# Patient Record
Sex: Female | Born: 1935 | Race: Black or African American | Hispanic: No | Smoking: Never smoker
Health system: Southern US, Community
[De-identification: ages and names within clinical notes are randomized; demographics above are authoritative.]

## PROBLEM LIST (undated history)

## (undated) DIAGNOSIS — Z91041 Radiographic dye allergy status: Secondary | ICD-10-CM

## (undated) DIAGNOSIS — K589 Irritable bowel syndrome without diarrhea: Secondary | ICD-10-CM

## (undated) DIAGNOSIS — R350 Frequency of micturition: Secondary | ICD-10-CM

## (undated) DIAGNOSIS — R300 Dysuria: Secondary | ICD-10-CM

## (undated) DIAGNOSIS — N3281 Overactive bladder: Secondary | ICD-10-CM

## (undated) DIAGNOSIS — M1990 Unspecified osteoarthritis, unspecified site: Secondary | ICD-10-CM

## (undated) DIAGNOSIS — N390 Urinary tract infection, site not specified: Secondary | ICD-10-CM

## (undated) DIAGNOSIS — I10 Essential (primary) hypertension: Secondary | ICD-10-CM

## (undated) HISTORY — PX: COLONOSCOPY: SHX174

## (undated) HISTORY — PX: APPENDECTOMY: SHX54

## (undated) HISTORY — DX: Frequency of micturition: R35.0

## (undated) HISTORY — DX: Unspecified osteoarthritis, unspecified site: M19.90

## (undated) HISTORY — DX: Irritable bowel syndrome, unspecified: K58.9

## (undated) HISTORY — PX: VAGINAL HYSTERECTOMY: SUR661

## (undated) HISTORY — DX: Essential (primary) hypertension: I10

## (undated) HISTORY — DX: Overactive bladder: N32.81

## (undated) HISTORY — DX: Radiographic dye allergy status: Z91.041

## (undated) HISTORY — PX: OVARIAN CYST REMOVAL: SHX89

## (undated) HISTORY — PX: CHOLECYSTECTOMY: SHX55

## (undated) HISTORY — DX: Dysuria: R30.0

---

## 2000-06-28 ENCOUNTER — Emergency Department (HOSPITAL_COMMUNITY): Admission: EM | Admit: 2000-06-28 | Discharge: 2000-06-28 | Payer: Self-pay | Admitting: *Deleted

## 2001-04-09 ENCOUNTER — Ambulatory Visit (HOSPITAL_COMMUNITY): Admission: RE | Admit: 2001-04-09 | Discharge: 2001-04-09 | Payer: Self-pay | Admitting: Pulmonary Disease

## 2001-07-10 ENCOUNTER — Emergency Department (HOSPITAL_COMMUNITY): Admission: EM | Admit: 2001-07-10 | Discharge: 2001-07-10 | Payer: Self-pay | Admitting: Emergency Medicine

## 2002-01-15 ENCOUNTER — Emergency Department (HOSPITAL_COMMUNITY): Admission: EM | Admit: 2002-01-15 | Discharge: 2002-01-15 | Payer: Self-pay | Admitting: Emergency Medicine

## 2002-12-28 ENCOUNTER — Ambulatory Visit (HOSPITAL_COMMUNITY): Admission: RE | Admit: 2002-12-28 | Discharge: 2002-12-28 | Payer: Self-pay | Admitting: Pulmonary Disease

## 2005-03-10 ENCOUNTER — Emergency Department (HOSPITAL_COMMUNITY): Admission: EM | Admit: 2005-03-10 | Discharge: 2005-03-10 | Payer: Self-pay | Admitting: Emergency Medicine

## 2005-08-27 ENCOUNTER — Ambulatory Visit (HOSPITAL_COMMUNITY): Admission: RE | Admit: 2005-08-27 | Discharge: 2005-08-27 | Payer: Self-pay | Admitting: Gastroenterology

## 2005-08-27 ENCOUNTER — Ambulatory Visit: Payer: Self-pay | Admitting: Gastroenterology

## 2005-09-11 ENCOUNTER — Ambulatory Visit: Payer: Self-pay | Admitting: Gastroenterology

## 2005-09-11 ENCOUNTER — Ambulatory Visit (HOSPITAL_COMMUNITY): Admission: RE | Admit: 2005-09-11 | Discharge: 2005-09-11 | Payer: Self-pay | Admitting: Gastroenterology

## 2005-09-11 ENCOUNTER — Encounter (INDEPENDENT_AMBULATORY_CARE_PROVIDER_SITE_OTHER): Payer: Self-pay | Admitting: Specialist

## 2005-09-19 ENCOUNTER — Ambulatory Visit: Payer: Self-pay | Admitting: Gastroenterology

## 2005-12-24 ENCOUNTER — Emergency Department (HOSPITAL_COMMUNITY): Admission: EM | Admit: 2005-12-24 | Discharge: 2005-12-24 | Payer: Self-pay | Admitting: Emergency Medicine

## 2006-02-03 ENCOUNTER — Ambulatory Visit: Payer: Self-pay | Admitting: Gastroenterology

## 2006-02-20 ENCOUNTER — Ambulatory Visit: Payer: Self-pay | Admitting: Gastroenterology

## 2006-03-21 ENCOUNTER — Ambulatory Visit: Payer: Self-pay | Admitting: Gastroenterology

## 2006-07-30 ENCOUNTER — Ambulatory Visit: Payer: Self-pay | Admitting: Gastroenterology

## 2006-08-04 ENCOUNTER — Ambulatory Visit: Payer: Self-pay | Admitting: Gastroenterology

## 2006-08-04 ENCOUNTER — Encounter: Payer: Self-pay | Admitting: Gastroenterology

## 2006-08-04 ENCOUNTER — Ambulatory Visit (HOSPITAL_COMMUNITY): Admission: RE | Admit: 2006-08-04 | Discharge: 2006-08-04 | Payer: Self-pay | Admitting: Gastroenterology

## 2006-08-23 ENCOUNTER — Emergency Department (HOSPITAL_COMMUNITY): Admission: EM | Admit: 2006-08-23 | Discharge: 2006-08-23 | Payer: Self-pay | Admitting: Emergency Medicine

## 2007-01-07 ENCOUNTER — Ambulatory Visit: Payer: Self-pay | Admitting: Gastroenterology

## 2007-04-15 ENCOUNTER — Ambulatory Visit: Payer: Self-pay | Admitting: Gastroenterology

## 2007-06-23 ENCOUNTER — Ambulatory Visit: Payer: Self-pay | Admitting: Gastroenterology

## 2007-07-02 ENCOUNTER — Ambulatory Visit (HOSPITAL_COMMUNITY): Admission: RE | Admit: 2007-07-02 | Discharge: 2007-07-02 | Payer: Self-pay | Admitting: Internal Medicine

## 2007-07-13 ENCOUNTER — Ambulatory Visit (HOSPITAL_COMMUNITY): Admission: RE | Admit: 2007-07-13 | Discharge: 2007-07-13 | Payer: Self-pay | Admitting: Gastroenterology

## 2007-07-13 ENCOUNTER — Encounter: Payer: Self-pay | Admitting: Gastroenterology

## 2007-07-13 ENCOUNTER — Ambulatory Visit: Payer: Self-pay | Admitting: Gastroenterology

## 2007-08-20 DIAGNOSIS — R12 Heartburn: Secondary | ICD-10-CM | POA: Insufficient documentation

## 2007-08-20 DIAGNOSIS — E785 Hyperlipidemia, unspecified: Secondary | ICD-10-CM | POA: Insufficient documentation

## 2007-08-20 DIAGNOSIS — R142 Eructation: Secondary | ICD-10-CM

## 2007-08-20 DIAGNOSIS — R143 Flatulence: Secondary | ICD-10-CM

## 2007-08-20 DIAGNOSIS — K649 Unspecified hemorrhoids: Secondary | ICD-10-CM | POA: Insufficient documentation

## 2007-08-20 DIAGNOSIS — K5900 Constipation, unspecified: Secondary | ICD-10-CM | POA: Insufficient documentation

## 2007-08-20 DIAGNOSIS — R634 Abnormal weight loss: Secondary | ICD-10-CM | POA: Insufficient documentation

## 2007-08-20 DIAGNOSIS — I10 Essential (primary) hypertension: Secondary | ICD-10-CM | POA: Insufficient documentation

## 2007-08-20 DIAGNOSIS — H409 Unspecified glaucoma: Secondary | ICD-10-CM | POA: Insufficient documentation

## 2007-08-20 DIAGNOSIS — K2940 Chronic atrophic gastritis without bleeding: Secondary | ICD-10-CM | POA: Insufficient documentation

## 2007-08-20 DIAGNOSIS — K219 Gastro-esophageal reflux disease without esophagitis: Secondary | ICD-10-CM | POA: Insufficient documentation

## 2007-08-20 DIAGNOSIS — R197 Diarrhea, unspecified: Secondary | ICD-10-CM | POA: Insufficient documentation

## 2007-08-20 DIAGNOSIS — R109 Unspecified abdominal pain: Secondary | ICD-10-CM | POA: Insufficient documentation

## 2007-08-20 DIAGNOSIS — R141 Gas pain: Secondary | ICD-10-CM | POA: Insufficient documentation

## 2007-08-20 DIAGNOSIS — K589 Irritable bowel syndrome without diarrhea: Secondary | ICD-10-CM | POA: Insufficient documentation

## 2007-08-20 DIAGNOSIS — R1013 Epigastric pain: Secondary | ICD-10-CM

## 2007-08-20 DIAGNOSIS — Z8669 Personal history of other diseases of the nervous system and sense organs: Secondary | ICD-10-CM | POA: Insufficient documentation

## 2007-08-20 DIAGNOSIS — K3189 Other diseases of stomach and duodenum: Secondary | ICD-10-CM | POA: Insufficient documentation

## 2007-08-20 DIAGNOSIS — K625 Hemorrhage of anus and rectum: Secondary | ICD-10-CM | POA: Insufficient documentation

## 2007-10-17 ENCOUNTER — Emergency Department (HOSPITAL_COMMUNITY): Admission: EM | Admit: 2007-10-17 | Discharge: 2007-10-17 | Payer: Self-pay | Admitting: Emergency Medicine

## 2007-11-06 ENCOUNTER — Emergency Department (HOSPITAL_COMMUNITY): Admission: EM | Admit: 2007-11-06 | Discharge: 2007-11-07 | Payer: Self-pay | Admitting: Emergency Medicine

## 2007-11-26 ENCOUNTER — Emergency Department (HOSPITAL_COMMUNITY): Admission: EM | Admit: 2007-11-26 | Discharge: 2007-11-26 | Payer: Self-pay | Admitting: Emergency Medicine

## 2007-12-23 ENCOUNTER — Ambulatory Visit: Payer: Self-pay | Admitting: Gastroenterology

## 2007-12-25 ENCOUNTER — Ambulatory Visit (HOSPITAL_COMMUNITY): Admission: RE | Admit: 2007-12-25 | Discharge: 2007-12-25 | Payer: Self-pay | Admitting: Urology

## 2008-01-11 ENCOUNTER — Ambulatory Visit (HOSPITAL_COMMUNITY): Admission: RE | Admit: 2008-01-11 | Discharge: 2008-01-11 | Payer: Self-pay | Admitting: General Surgery

## 2008-07-27 ENCOUNTER — Emergency Department (HOSPITAL_COMMUNITY): Admission: EM | Admit: 2008-07-27 | Discharge: 2008-07-28 | Payer: Self-pay | Admitting: Emergency Medicine

## 2008-10-21 ENCOUNTER — Ambulatory Visit (HOSPITAL_COMMUNITY): Admission: RE | Admit: 2008-10-21 | Discharge: 2008-10-21 | Payer: Self-pay | Admitting: Nephrology

## 2008-11-25 ENCOUNTER — Emergency Department (HOSPITAL_COMMUNITY): Admission: EM | Admit: 2008-11-25 | Discharge: 2008-11-25 | Payer: Self-pay | Admitting: Emergency Medicine

## 2008-12-13 ENCOUNTER — Ambulatory Visit: Payer: Self-pay | Admitting: Gastroenterology

## 2008-12-14 ENCOUNTER — Encounter: Payer: Self-pay | Admitting: Gastroenterology

## 2009-01-18 ENCOUNTER — Ambulatory Visit (HOSPITAL_COMMUNITY): Admission: RE | Admit: 2009-01-18 | Discharge: 2009-01-18 | Payer: Self-pay | Admitting: Gastroenterology

## 2009-01-18 ENCOUNTER — Ambulatory Visit: Payer: Self-pay | Admitting: Gastroenterology

## 2009-11-04 ENCOUNTER — Emergency Department (HOSPITAL_COMMUNITY)
Admission: EM | Admit: 2009-11-04 | Discharge: 2009-11-04 | Payer: Self-pay | Source: Home / Self Care | Admitting: Emergency Medicine

## 2009-12-05 ENCOUNTER — Emergency Department (HOSPITAL_COMMUNITY)
Admission: EM | Admit: 2009-12-05 | Discharge: 2009-12-05 | Payer: Self-pay | Source: Home / Self Care | Admitting: Emergency Medicine

## 2009-12-26 ENCOUNTER — Ambulatory Visit: Payer: Self-pay | Admitting: Gastroenterology

## 2010-02-05 ENCOUNTER — Encounter: Payer: Self-pay | Admitting: Nephrology

## 2010-02-15 NOTE — Assessment & Plan Note (Signed)
Summary: DYSPEPSIA, POLYPS   Visit Type:  Follow-up Visit [facility]:  [name], M.D.   Chief Complaint:  1 year follow up- doing ok.  History of Present Illness: BP up and down. Pain from her arthritis. Rx: Hyrdocodone. Has a sensitive stomach and took it for one week, but started to burp and had gas so she stopped for a while. Also taking acetaminophen. CAN'T TAKE ANYTHING IN THE [facility]. BMS: PRACTICALLY EVERY DAY. Not too much milk ice cream or cheese. Rare carbonated beverages-Ginger Ale. Had great northern beans and before not a lot of gas, but after hydrocodone now lots of gas.  Current Medications (verified): 1)  Amlodipine Besy-Benazepril Hcl 5-20 Mg  Caps (Amlodipine Besy-Benazepril Hcl) .... Take 1 Capsule By Mouth Once A Day 2)  Xalatan 0.005 %  Soln (Latanoprost) .... At Bedtime 3)  Caltrate+d .... Once Daily 4)  Fish Oil .... Take 1 Tablet By Mouth Two Times A Day 5)  Tylenol .... As Needed 6)  Vesicare 5 Mg Tabs (Solifenacin Succinate) .... Once Daily 7)  Tekturna 150 Mg Tabs (Aliskiren Fumarate) .... 2 Once Daily 8)  Diovan 80 Mg Tabs (Valsartan) .... 2 Once Daily 9)  Carvedilol 12.5 Mg Tabs (Carvedilol) .... Once Daily 10)  Omeprazole 20 Mg Cpdr (Omeprazole) .... Once Daily  Allergies (verified): 1)  ! Sulfa 2)  ! Levsin 3)  ! Bentyl 4)  ! * Iv Contrast  Past History:  Past Medical History: ASCENDING COLON POLYP      Tubulovillous adenoma with multifocal high grade dysplasia-EMR 2007 [facility]      TV ADENOMA w/o dysplasia 2008      SIMPLE ADENOMA 2009      4mm SIMPLE ADENOMA JAN 2011 Non-ulcer dyspepsia HEMORRHOIDS-pain, no bleeding IBS HTN ALLERGY TO IV CONTRAST Glaucoma  Past Surgical History: Reviewed history from 08/20/2007 and no changes required. APPENDECTOMY HYSTERECTOMY REMOVAL OF OVARIAN CYST  Vital Signs:  Patient profile:   77 year old female Height:      64 inches Weight:      159 pounds BMI:     27.39 Temp:     98.0  degrees F oral Pulse rate:   60 / minute BP sitting:   130 / 92  (right arm) Cuff size:   regular  Vitals Entered By: [name] LPN (December 26, 2009 10:44 AM)  Physical Exam  General:  Well developed, well nourished, no acute distress. Head:  Normocephalic and atraumatic. Mouth:  No deformity or lesions. Neck:  Supple; no masses. Lungs:  Clear throughout to auscultation. Heart:  Regular rate and rhythm; no murmurs Abdomen:  Soft, nontender and nondistended. Normal bowel sounds. Extremities:  No edema or deformities noted. Neurologic:  Alert and  oriented x4;  grossly normal neurologically.  Impression & Recommendations:  Problem # 1:  DYSPEPSIA (ICD-536.8) Assessment Deteriorated After taking Vicodin. Add probiotic daily. Samples of Restrora given and Rx. AVOID FOODS THAT MAY CAUSE GAS AND BLOATING. HO given. OPV APR 2012.   Orders: Est. Patient Level III (04540)  Problem # 2:  TUBULOVILLOUS ADENOMA, COLON (ICD-211.3) Assessment: Unchanged TCS JAN 2014.  CC: PCP  Patient Instructions: 1)  TAKE RESTORA ONCE A DAY. 2)  AVOID FOODS THAT MAY CAUSE GAS AND BLOATING. 3)  FOLLOW UP IN APR 2012. 4)  The medication list was reviewed and reconciled.  All changed / newly prescribed medications were explained.  A complete medication list was provided to the patient / caregiver.  Appended Document: DYSPEPSIA,  POLYPS F/U OPV IN APRIL 2012 IS IN THE COMPUTER

## 2010-03-02 ENCOUNTER — Encounter (INDEPENDENT_AMBULATORY_CARE_PROVIDER_SITE_OTHER): Payer: Self-pay | Admitting: *Deleted

## 2010-03-07 NOTE — Letter (Signed)
Summary: Recall Office Visit  [facility]  [address]   [place], [place] [address]   Phone: [phone]  Fax: [phone]      March 02, 2010   Deborah Farrell [address] [place], [place]  [address] 1917/04/16   Dear Deborah Farrell,   According to our records, it is time for you to schedule a follow-up office visit with [place].   At your convenience, please call [phone] to schedule an office visit. If you have any questions, concerns, or feel that this letter is in error, we would appreciate your call.   Sincerely,    Diana Eves  [facility] Ph: [phone]   Fax: [phone]

## 2010-03-27 LAB — BASIC METABOLIC PANEL
Calcium: 9.7 mg/dL (ref 8.4–10.5)
Creatinine, Ser: 0.99 mg/dL (ref 0.4–1.2)
GFR calc Af Amer: >60 mL/min (ref >60)
GFR calc non Af Amer: 53 mL/min — ABNORMAL LOW (ref >60)
Sodium: 140 mEq/L (ref 135–145)

## 2010-04-09 ENCOUNTER — Encounter: Payer: Self-pay | Admitting: Gastroenterology

## 2010-04-10 DIAGNOSIS — K30 Functional dyspepsia: Secondary | ICD-10-CM | POA: Insufficient documentation

## 2010-04-18 LAB — CK TOTAL AND CKMB (NOT AT ARMC)
Relative Index: INVALID (ref 0.0–2.5)
Total CK: 91 U/L (ref 7–177)

## 2010-04-22 LAB — URINALYSIS, ROUTINE W REFLEX MICROSCOPIC
Glucose, UA: NEGATIVE mg/dL
Ketones, ur: NEGATIVE mg/dL
Protein, ur: NEGATIVE mg/dL
pH: 5.5 (ref 5.0–8.0)

## 2010-04-22 LAB — URINE MICROSCOPIC-ADD ON

## 2010-04-26 ENCOUNTER — Encounter: Payer: Self-pay | Admitting: Gastroenterology

## 2010-04-26 ENCOUNTER — Ambulatory Visit (INDEPENDENT_AMBULATORY_CARE_PROVIDER_SITE_OTHER): Payer: Medicare Other | Admitting: Gastroenterology

## 2010-04-26 DIAGNOSIS — K589 Irritable bowel syndrome without diarrhea: Secondary | ICD-10-CM

## 2010-04-26 DIAGNOSIS — D126 Benign neoplasm of colon, unspecified: Secondary | ICD-10-CM

## 2010-04-26 NOTE — Progress Notes (Signed)
Pt is aware of OV on 07/26/10 at 2 pm with [facility]

## 2010-04-26 NOTE — Assessment & Plan Note (Addendum)
MOST LIKELY CAUSE FOR INTERMITTENT ABD PAIN RELIEVED BY BM. Less likely 2o to meds.  Don't take omeprazole for 2 weeks and monitor episodes. If episode improve, continue to hold the omeprazole and take the [facility] daily. If episodes remain the same, then restart omeprazole daily. Follow up in 3 mos.

## 2010-04-26 NOTE — Patient Instructions (Signed)
REASONS FOR PERIODIC COLICKY PAIN: MEDICATION (RESTORA, OR OMEPRAZOLE) OR IRRITABLE BOWEL SYNDROME  Don't take omeprazole for 2 weeks and monitor episodes. If episode improve, continue to hold the omeprazole and take the [facility] daily. If episodes remain the same, then restart omeprazole daily. Follow up in 3 mos.

## 2010-04-26 NOTE — Progress Notes (Signed)
  Subjective:    Patient ID: Deborah Farrell, female    DOB: 1917/10/23, 77 y.o.   MRN: [id number]  HPI Been doing pretty good. Ever so often gets colicky pain, then BM (solid to soft, not watery). Taking OMP and Retsora so she stopped the Restora. Still has episodes once a week. Hasn't figured out what triggers it. Not really consuming dairy. [facility].  Past Medical History  Diagnosis Date          . Tubulovillous adenoma 2007 EMR [facility]    with multifocal high grade dysplasia  . Non-ulcer dyspepsia   . Hemorrhoids   . IBS (irritable bowel syndrome)   . HTN (hypertension)   . Glaucoma   . Tubulovillous adenoma 2008  ASC COLON W/O DYSPLASIA   . Adenoma of large intestine 2009 [facility] COLON SIMPLE    2011 [facility] COLON SIMPLE  . Allergy history, radiographic dye      Review of Systems DEC 2011 159 LBS    Objective:   Physical Exam  Constitutional: She is oriented to person, place, and time. She appears well-developed and well-nourished. No distress.  HENT:  Head: Normocephalic and atraumatic.  Mouth/Throat: Oropharynx is clear and moist.  Eyes: Pupils are equal, round, and reactive to light.  Cardiovascular: Normal rate and regular rhythm.   Pulmonary/Chest: Effort normal and breath sounds normal.  Abdominal: Soft. Bowel sounds are normal. There is no tenderness.  Musculoskeletal: She exhibits edema.       TRACE  Neurological: She is alert and oriented to person, place, and time.          Assessment & Plan:

## 2010-04-26 NOTE — Assessment & Plan Note (Signed)
TCS 2014

## 2010-05-21 ENCOUNTER — Telehealth: Payer: Self-pay

## 2010-05-21 NOTE — Telephone Encounter (Signed)
Pt came by the office. Said she stopped the Omeprazole as instructed. . She has not had any more colic. She started the [facility] on 05/09/2010. She is having some reflux about 2-3x weekly. Please advise!

## 2010-05-23 NOTE — Telephone Encounter (Signed)
Pt informed

## 2010-05-23 NOTE — Telephone Encounter (Signed)
Please call pt. If she is having reflux Sx then she should restart the OMP and take 30 minutes prior to her first meal. Continue [employer] daily.

## 2010-05-29 NOTE — Assessment & Plan Note (Signed)
NAMEMarland Kitchen  Deborah, Farrell                CHART#:  [id number]   DATE:  01/07/2007                       DOB:  1917-12-07   REFERRING PHYSICIAN:  [name], M.D.   PROBLEM LIST:  1. Recurrent tubulovillous adenoma in the ascending colon, last      colonoscopy July 2008.  2. Bloating, likely secondary to lactose intolerance.  3. Burning and gnawing in the middle of her stomach, felt to be      secondary to TriCor.  4. History of irritable bowel syndrome.  5. Hypertension.  6. Allergy to sulfa, Levsin, Bentyl, and IV contrast.  7. Glaucoma.  8. Mild chronic gastritis on EGD in August 2007.   SUBJECTIVE:  Deborah Farrell is an 77 year old female who presents as a return  patient visit.  She has been having problems with hemorrhoidal pain for  the last month.  She did use Anucort suppositories for a week and had  improvement, but denies complete resolution.  She also was using sitz  baths.  She has been taking exercise, where she has been doing leg lifts  with weights.  She denies any rectal bleeding.  She just has swelling  and pain/pressure, not all the time.  She has a bowel movement daily and  does not strain.  She is trying to drink her 6 to 8 cups of water a day.  She is not using any stool softeners.  Her bloating is doing well with  probiotics.   MEDICATIONS:  1. Lisinopril.  2. Dilantin.  3. Centrum Silver.  4. Acidophilus.  5. Fish oil.   OBJECTIVE:  Weight 163 pounds (up 2 pounds since July 2008), height 5  feet 5 inches, temperature 97.9, blood pressure 120/72, pulse 72.  GENERAL:  She is in no apparent distress.  Alert and oriented x4.  LUNGS:  Clear to auscultation bilaterally.  CARDIOVASCULAR:  Regular rhythm.  No murmur.  Normal S1, S2.  ABDOMEN:  Bowel sounds present, soft.  Nontender, nondistended.  No  rebound or guarding.  NEURO:  She has no focal neurologic deficits.   ASSESSMENT:  Deborah Farrell is an 77 year old female who has hemorrhoidal  discomfort.  She would  likely benefit from a longer course of Anucort  HC.  Thank you for allowing me to see Deborah Farrell in consultation.  My  recommendations are to follow.   RECOMMENDATIONS:  1. She is instructed to avoid doing the bicycle as part of her      exercise regimen, and she may do the leg lifts without the weights.  2. Anusol HC 1 per rectum every 12 hours for 14 days with 1 refill.  3. Add Colace 2 to 3 times a day to soften stool for 2 weeks.  She is      given her discharge instructions in writing.  4. She should continue to follow a high fiber diet and drink 6 to 8      cups of water a day.  She should add Citrucel or Fibersure once      daily for 2 weeks.  5. She may use witch hazel pads, ibuprofen, or Aleve for rectal pain.      She may also use Tylenol.  6. She has a follow up appointment to see me in 1 month.  7. TCS in 07/2007.  Deborah Farrell, M.D.  Electronically Signed     SM/MEDQ  D:  01/07/2007  T:  01/07/2007  Job:  [id number]   cc:   [name], M.D.

## 2010-05-29 NOTE — H&P (Signed)
NAME:  Deborah Farrell, Deborah Farrell               ACCOUNT NO.:  [id number]   MEDICAL RECORD NO.:  [id number]          PATIENT TYPE:  AMB   LOCATION:  DAY                           FACILITY:  [facility]   PHYSICIAN:  Dalia Heading, M.D.  DATE OF BIRTH:  1917-04-24   DATE OF ADMISSION:  DATE OF DISCHARGE:  LH                              HISTORY & PHYSICAL   CHIEF COMPLAINT:  Hemorrhoidal disease.   HISTORY OF PRESENT ILLNESS:  The patient is an 77 year old black female  who is referred for evaluation and treatment of hemorrhoidal disease.  She has tried multiple on rectal creams for hemorrhoidal disease, but  she continues to have irritation and swelling.  She denies any  constipation.  She does have to strain sometimes to move her bowels due  to her history of diverticulosis.  She is on a fiber diet.   PAST MEDICAL HISTORY:  Includes hypertension.   PAST SURGICAL HISTORY:  Cholecystectomy.   CURRENT MEDICATIONS:  Zilactin, Caltrate, fish oil, Diovan, Tekturna,  __________.   ALLERGIES:  No known drug allergies.   REVIEW OF SYSTEMS:  The patient denies any recent chest pain, MI, CVA,  or bleeding disorders.   PHYSICAL EXAMINATION:  GENERAL:  The patient is a well-developed, well-  nourished black female who appears at her stated age.  LUNGS:  Clear to auscultation with equal breath sounds bilaterally.  HEART:  Regular rate and rhythm without S3, S4, or murmurs.  ABDOMEN:  Soft, nontender, nondistended.  No hepatosplenomegaly or  masses are noted.  RECTAL:  Internal hemorrhoidal disease noted along the right lateral  aspect of the anus.   No significant rectal stenosis is noted.  Sphincter tone is fair.   IMPRESSION:  Hemorrhoidal disease.   PLAN:  The patient was scheduled for hemorrhoidectomy on January 11, 2008.  The risks and benefits of the procedure including bleeding,  infection, and recurrence of hemorrhoidal disease were fully explained  to the patient, gave informed  consent.      Dalia Heading, M.D.     MAJ/MEDQ  D:  12/24/2007  T:  12/25/2007  Job:  [id number]   cc:   [name], M.D.  Fax: [phone]   Kassie Mends, M.D.  [address]  [place] , [place] [address]

## 2010-05-29 NOTE — Consult Note (Signed)
NAME:  Deborah Farrell, Deborah Farrell               ACCOUNT NO.:  [id number]   MEDICAL RECORD NO.:  [id number]          PATIENT TYPE:  AMB   LOCATION:  DAY                           FACILITY:  [facility]   PHYSICIAN:  Kassie Mends, M.D.      DATE OF BIRTH:  07-Sep-1917   DATE OF CONSULTATION:  DATE OF DISCHARGE:                                 CONSULTATION   REFERRING PHYSICIAN:  [name], M.D.   PROBLEM LIST:  1. Recurrent tubulovillous adenoma in the ascending colon requiring      endoscopic mucosal resection in October 2007 and snare cautery and      piecemeal removal in July 2008.  2. Rectal discomfort and rectal bleeding secondary to hemorrhoids.  3. Bloating secondary to lactose intolerance.  4. Dyspepsia secondary to Tricor.  5. Irritated bowel syndrome diagnosed 9 years ago.  6. Hypertension.  7. Allergic to SULFA, LEVSIN, BENTYL, and IV CONTRAST.  8. Glaucoma.  9. Mild chronic gastritis on esophagogastroduodenoscopy in August      2007.   SUBJECTIVE:  Deborah Farrell is an 77 year old female who presents as a return  patient visit.  She has been seen for the last 5 to 6 months with  intermittent rectal bleeding and rectal discomfort due to the  hemorrhoids.  She is now managing her symptoms with water and softening  her stool.  She has also been exercising.  She is using the rectal cream  less than once a week.  If she feels swollen, then she will use the  cream sooner and therefore avoid flares.  She also due for her followup  colonoscopy due to the history of recurrent tubular-villous adenoma.   MEDICATIONS:  1. Amlodipine.  2. TriCor.  3. Xalatan.  4. Caltrate.  5. Fish oil.  6. Probiotic 2 daily.  7. Tylenol as needed.  8. Stool softner as needed.  9. Analpram or Anucort as needed.   OBJECTIVE:  VITAL SIGNS:  Her weight 166 pounds (up 2 pounds since April  of 2009).  Height 5 feet 5 inches.  Temperature 98.4, blood pressure  140/60, and pulse 80.  GENERAL:  She is in no apparent  distress.  She is alert and oriented  x4.LUNGS:  Clear to auscultation bilaterally.CARDIOVASCULAR:  Regular  rate and rhythm.  ABDOMEN:  Bowel sounds are present.  Soft, nontender and nondistended.   ASSESSMENT:  Deborah Farrell is an 77 year old female who is learning to  manage her hemorrhoids conservatively.  She does not desire surgical  intervention at this time.  She has a history of tubulovillous adenoma  and needs a repeat colonoscopy with a pediatric colonoscope.  She has  had no recent rectal bleeding. Thank you for allowing me to see Ms.  Farrell in consultation. My recommendations follow.   RECOMMENDATIONS:  1. Will schedule a colonoscopy in late June 2009, with a MoviPrep.  2. She has a follow up appointment to see me in 4 months.  She should      continue to manage her hemorrhoids conservatively and if that fails      then she  realizes surgical intervention is her only option.      Kassie Mends, M.D.  Electronically Signed     SM/MEDQ  D:  06/23/2007  T:  06/24/2007  Job:  [id number]   cc:   [name], M.D.  Fax: [phone]

## 2010-05-29 NOTE — Assessment & Plan Note (Signed)
NAME:  Deborah Farrell, Deborah Farrell                CHART#:  [id number]   DATE:                                   DOB:  01/27/1917   REFERRED BY:  [name], M.D.   PROBLEM:  1. Recurrent tubulovillous adenoma in the ascending colon, status post      endoscopic mucosal resection in October 2007, with recurrence of      the adenoma in July 2008.  2. Rectal discomfort and rectal bleeding with hemorrhoids.  3. Bloating, secondary to lactose intolerance.  4. Dyspepsia, secondary to TriCor.  5. History of irritable bowel syndrome diagnosed nine years ago.  6. Hypertension.  7. Allergies to SULFA, LEVSIN, BENTYL AND IV CONTRAST.  8. Glaucoma.  9. Mild chronic gastritis on the esophagogastroduodenoscopy in August,      2007.   SUBJECTIVE:  Deborah Farrell is an 77 year old female who presents as a return  patient visit.  She was last seen in December 2008.  She was complaining  of hemorrhoidal pain.  The exacerbation occurred due to doing exercises  with leg lifts.  At the time, she denied any rectal bleeding.  She was  asked to avoid straining and was given a prescription for Anusol HC with  one refill.  She was also asked to add Colace to her regimen.  She was  given a referral to Dr. Dalia Heading, because her hemorrhoids were  burning and itching.  She saw Dr. Lovell Sheehan yesterday and he stated she  was not a good operative candidate.  She has been utilizing Vaseline for  dryness around her anus.  He gave her a prescription for Analpram.  She  complains of rectal discomfort.  She only has ripping, tearing pain in  her rectum when she is constipated.  Sometimes she has hard stools, then  soft stool, then moderate stool.  She adjusts her Colace according to  the consistency of her stool.   MEDICATIONS:  1. Xalatan.  2. Centrum.  3. Acidophilus.  4. Fish oil.  5. Stool softener.  6. Amlodipine/Benazepril.  7. Bystolic.  8. Caltrate.  9. Analpram cream.   OBJECTIVE:  VITAL SIGNS:  Weight 164 pounds  (unchanged since December  2008), height 5 feet 5 inches, temperature 94 degrees, blood pressure  150/78, pulse 72.  GENERAL:  She is in no apparent distress, alert and oriented x4.  LUNGS:  Clear to auscultation bilaterally.CARDIOVASCULAR:  A regular rhythm.  No  murmurs.  ABDOMEN:  Bowel sounds present.  Soft, nontender, nondistended.   ASSESSMENT:  Deborah Farrell is an 77 year old female with rectal pain and  discomfort and occasional rectal bleeding, secondary to hemorrhoids.  She has been seen and evaluated by surgery, who declined any operative  therapy at this point.   Thank you for allowing me to see Deborah Farrell in consultation.  My  recommendations follow.   RECOMMENDATIONS:  1. She may use the Analpram-HC 2.5% three to four times daily for two      weeks.  She is given a prescription with five refills.  I did warn      her that chronic use of hydrocortisone could theoretically cause      problems with her adrenal glands, and that she should not use the      Analpram for  more than two weeks.  Once the Analpram is done, then      she should use the Preparation-H medicated wipes for any rectal      discomfort that she may have.  2. She needs a screening colonoscopy within the next three to four      months to reassess her ascending colon.  3. Return visit in two months.       Kassie Mends, M.D.  Electronically Signed     SM/MEDQ  D:  04/15/2007  T:  04/15/2007  Job:  [id number]   cc:   [name], M.D.

## 2010-05-29 NOTE — Consult Note (Signed)
[name], [name]               ACCOUNT NO.:  [id number]   MEDICAL RECORD NO.:  [id number]          PATIENT TYPE:  AMB   LOCATION:  DAY                           FACILITY:  [facility]   PHYSICIAN:  Kassie Mends, M.D.      DATE OF BIRTH:  May 23, 1917   DATE OF CONSULTATION:  07/30/2006  DATE OF DISCHARGE:                                 CONSULTATION   DAY OF VISIT:  07/30/2006   REFERRING PHYSICIAN:  Shaune Pollack   PROBLEM LIST:  1. Bloating likely secondary to lactose intolerance.  2. History of tubulovillous adenoma with multi focal high-grade      dysplasia removed via endoscopic mucosal resection in October 2007.  3. Burning and nervous gnawing in the middle her stomach secondary to      Tricor.  4. History of IBS diagnosed 8 years ago.  5. Hypertension.  6. Allergies to SULFA,  LEVSIN, BENTYL AND IV CONTRAST.  7. Glaucoma.  8. EGD in August 2007 which showed mild chronic gastritis.   SUBJECTIVE:  Deborah Farrell is a 77 year old female who presents as a return  patient visit.  She is on acidophilus daily which keeps the gas away.  She still avoiding dairy products some.  She is using Lactaid milk.  The burning and nervous gnawing is not as bad.  She also takes fish oil  once daily because 2 tablets cause abdominal cramping.   OBJECTIVE:  Weight 161 pounds, height 5 feet 7,  temperature 98.4, blood  pressure 128/70, pulse 72.  GENERAL:  She is in no apparent distress,  alert and orient x4.  LUNGS:  Clear to auscultation bilaterally.  CARDIOVASCULAR:  Shows regular rhythm, no murmur, normal S1-S2.  ABDOMEN:  Bowel sounds are present, soft, nontender, nondistended.  No  rebound or guarding.   ASSESSMENT:  Deborah Farrell is a 77 year old female who had a large  tubulovillous adenoma with multifocal high-grade dysplasia removed in  October 2007 and needs follow-up surveillance.  Thank you for allowing  me to see Ms. Troeger in consultation.  My recommendations follow.   RECOMMENDATIONS:  1.  Will schedule colonoscopy for surveillance.  2. She should continue the acidophilus and the Lactaid  3. Follow-up appointment in 6 months.      Kassie Mends, M.D.  Electronically Signed     SM/MEDQ  D:  07/31/2006  T:  08/01/2006  Job:  [id number]   cc:   Ramon Dredge L. Juanetta Gosling, M.D.  Fax: [phone]

## 2010-05-29 NOTE — Op Note (Signed)
NAME:  Deborah Farrell, Deborah Farrell               ACCOUNT NO.:  [id number]   MEDICAL RECORD NO.:  [id number]          PATIENT TYPE:  AMB   LOCATION:  DAY                           FACILITY:  [facility]   PHYSICIAN:  Kassie Mends, M.D.      DATE OF BIRTH:  1917/06/12   DATE OF PROCEDURE:  08/04/2006  DATE OF DISCHARGE:                               OPERATIVE REPORT   PROCEDURE:  Colonoscopy with piecemeal removal of ascending colon polyp,  via cold snare cold forceps biopsy and with cauterization of the base.   INDICATION FOR EXAM:  [name] is a 77 year old female with a history  of tubulovillous adenoma, with multifocal high-grade dysplasia removed  via endoscopic mucosal resection in October 2007 [facility].  She presents for follow-up colonoscopy to  reevaluate the polypectomy site.   FINDINGS:  1. A 1 cm sessile ascending colon polyp, seen in the area of previous      endoscopic mucosal resection -- as indicated by scarring.  An      attempt was made initially to remove the polyp via snare.  It was      difficult to get the snare around the polyp.  Part of the polyp was      snared via cold snare.  The cold forceps biopsies were introduced      into the scope and the polyp was removed piecemeal.  The base of      the polyp was cauterized using the 7-French Gold probe (200/20).  2. Pan colonic diverticulosis, most pronounced in the left colon.  3. Otherwise no masses, inflammatory changes, AVMs or internal      hemorrhoids seen.   RECOMMENDATIONS:  1. Clear liquid diet only for 8 hours, then resume a high-fiber diet.  2. The base of the ascending colon polyp was tattooed with 3 mL of      [place] ink.  If the polyp returns on pathology as a tubulovillous      adenoma with high-grade dysplasia, then [name] should likely      have a right hemicolectomy.  The polypectomy site has been      cauterized as much as possible.  Subsequent cauterization could      lead  to bowel perforation.  3. Will call [name] with her biopsy report. Colonoscopy in one      year.  4. No aspirin, NSAIDs or anticoagulation for 5 days.  5. She is given a handout on a high-fiber diet, polyps and      diverticulosis.   MEDICATIONS:  1. Demerol 75 mg IV.  2. Versed 4 mg IV.   PROCEDURE TECHNIQUE:  Physical examination was performed and an informed  consent was obtained from the patient, after explaining the benefits,  risks and alternatives to the procedure.  The patient was connected to  the monitor and placed in the left lateral position.  Continuous oxygen  was provided by nasal cannula and IV medicine administered through an  indwelling cannula.  After administration of sedation and rectal exam,  the pediatric  colonoscope was advanced under direct visualization to the cecum.  The  scope was removed slowly by carefully examining the color, texture,  anatomy and integrity of the mucosa on the way out.  The patient was  recovered in endoscopy and discharged home in satisfactory condition.      Kassie Mends, M.D.  Electronically Signed     SM/MEDQ  D:  08/04/2006  T:  08/04/2006  Job:  [id number]

## 2010-05-29 NOTE — Assessment & Plan Note (Signed)
NAMEMarland Kitchen  Deborah Farrell, Deborah Farrell                CHART#:  [id number]   DATE:  12/23/2007                       DOB:  12-Dec-1917   REFERRING PHYSICIAN:  [name], MD.   PROBLEM LIST:  1. Tubulovillous adenoma in the ascending colon requiring endoscopic      mucosal resection in October 2007 and piecemeal removal in July      2008.  Last colonoscopy in June 2009 showed tubular adenoma without      evidence of high-grade dysplasia.  2. Moderate diverticulosis.  3. Hypertension.  4. Hyperlipidemia.  5. Hemorrhoids.   SUBJECTIVE:  The patient is an 77 year old female who presents as a  return patient visit.  She continues to complain of her hemorrhoids  following up and causing itching and discomfort.  She has used Analpram  3 times a day for 10 days with complete resolution of her symptoms since  June 2009.  She reports that when she does use the cream his symptoms  resolve by 4-5 days later they are back.  She has mild rectal  discomfort.  She has not been constipated since been on a high-fiber  diet.  Actually, she overdoes it on the fibers.  It causes her  hemorrhoids to flare if her stool is too soft.  She has 2-3 bowel  movements a day.  She sees bleeding when she wipes less than once a  week.  She did have to use Analpram this morning because she has  stinging in the rectum.   MEDICATIONS:  1. Xalatan.  2. Caltrate.  3. Fish oil.  4. Probiotic as needed.  5. Tylenol as needed.  6. Stool softeners as needed.  7. Analpram as needed.  8. Trilipix.  9. Diovan.  10.Tekturna.  11.Carveda.   OBJECTIVE:  VITAL SIGNS:  Weight 164 pounds (down 2 pounds since June  2009), height 5 feet 5 inches, temperature 93, blood pressure 126/82,  and pulse 68.  GENERAL:  She is in no apparent distress.  Alert and oriented x4.HEENT:  Atraumatic, normocephalic.  Pupils equal and reactive to light.  Mouth,  no oral lesions.  Posterior pharynx without erythema or exudate.NECK:  Full range of motion.  No  lymphadenopathy.LUNGS:  Clear to auscultation  bilaterally.CARDIOVASCULAR:  Regular rhythm.  No murmur.  Normal S1 and  S2.ABDOMEN:  Bowel sounds are present, soft, nontender, nondistended.  No rebound or guarding.RECTUM:  She has small internal hemorrhoids that  do not appear to prolapse when she is in the left lateral position and  participates in the Valsalva maneuver.   ASSESSMENT:  The patient is an 77 year old female who continues to have  hemorrhoidal discomfort.  She would require a periodic use of Analpram.  Thank you for allowing me to see the patient in consultation.  My  recommendations follow.   RECOMMENDATIONS:  1. She may continue using the Analpram for rectal discomfort and      bleeding.  2. Will refer her to Dr. Lovell Sheehan to reassess the benefits versus risk      of hemorrhoidectomy.  3. Follow up appointment in 2 months.       Kassie Mends, M.D.  Electronically Signed     SM/MEDQ  D:  12/23/2007  T:  12/23/2007  Job:  [id number]   cc:   [name], M.D.

## 2010-05-29 NOTE — Op Note (Signed)
NAME:  Deborah Farrell, Deborah Farrell               ACCOUNT NO.:  [id number]   MEDICAL RECORD NO.:  [id number]          PATIENT TYPE:  AMB   LOCATION:  DAY                           FACILITY:  [facility]   PHYSICIAN:  Kassie Mends, M.D.      DATE OF BIRTH:  Oct 08, 1917   DATE OF PROCEDURE:  07/13/2007  DATE OF DISCHARGE:                               OPERATIVE REPORT   REFERRING PHYSICIAN:  [name], MD.   PROCEDURE:  Colonoscopy with cold forceps polypectomy and ablation of  the polyp base using cautery.   INDICATIONS FOR EXAMINATION:  Deborah Farrell is an 77 year old female who had  a tubulovillous adenoma in the ascending colon requiring an endoscopic  mucosal resection in August 2007.  She had a repeat colonoscopy in July  2008 and had piecemeal removal of a polyp in the same area which was  found to be a tubulovillous adenoma without evidence of high-grade  dysplasia.  She presents today to re-evaluate for recurrence of her  tubulovillous adenoma in the ascending colon.   FINDINGS:  1. A 6-mm flat polyp in the descending colon adjacent to the area that      was tattooed previously in July 2008.  I attempted to remove the      polyp via snare cautery, but because the polyp is located on the      fold, the snare could not be placed around the polyp.  The polyp      was removed piecemeal with the cold forceps biopsy and the base of      the polyp was cauterized.  2. Frequent shallow sigmoid diverticula.  Otherwise, no masses,      inflammatory changes, or AVMs seen.  3. Unable to appreciate internal hemorrhoids.   DIAGNOSIS:  Recurrent tubulovillous adenoma in the ascending colon.   RECOMMENDATIONS:  1. Will call Deborah Farrell with results of her biopsies.  Recommend repeat      colonoscopy in 1 year.  2. No aspirin, NSAIDs, or anticoagulation for 7 days.  She should      follow high-fiber diet.  She is given a handout on high-fiber diet,      diverticulosis, and hemorrhoids.   MEDICATIONS:  1. Demerol  50 mg IV.  2. Versed 4 mg IV.   PROCEDURE TECHNIQUE:  Physical exam was performed.  Informed consent was  obtained from the patient explaining benefits, risks, and alternatives  to the procedure.  The patient was connected to monitor and placed in  the left lateral position.  Continuous oxygen was provided by nasal  cannula, IV medicine administered through an indwelling cannula.  After  administration of sedation and rectal exam, the patient's rectum was  then abated, the scope advanced under direct visualization to the cecum.  The scope was removed slowly by carefully examining the  color, texture, anatomy, and integrity of the mucosa on the way out.  The patient was recovered in endoscopy and discharged home in  satisfactory condition.   PATH:  Simple adenoma. TCS in 1 year.      Kassie Mends, M.D.  Electronically  Signed     SM/MEDQ  D:  07/13/2007  T:  07/13/2007  Job:  [id number]   cc:   [name], M.D.  Fax: [phone]

## 2010-05-29 NOTE — H&P (Signed)
NAME:  Deborah Farrell, Deborah Farrell               ACCOUNT NO.:  [id number]   MEDICAL RECORD NO.:  [id number]          PATIENT TYPE:  AMB   LOCATION:  DAY                           FACILITY:  [facility]   PHYSICIAN:  Dalia Heading, M.D.  DATE OF BIRTH:  13-Mar-1917   DATE OF ADMISSION:  DATE OF DISCHARGE:  LH                              HISTORY & PHYSICAL   CHIEF COMPLAINT:  Hemorrhoidal disease.   HISTORY OF PRESENT ILLNESS:  The patient is an 77 year old black female  who is referred for evaluation and treatment of hemorrhoidal disease.  She has tried multiple on rectal creams for hemorrhoidal disease, but  she continues to have irritation and swelling.  She denies any  constipation.  She does have to strain sometimes to move her bowels due  to her history of diverticulosis.  She is on a fiber diet.   PAST MEDICAL HISTORY:  Includes hypertension.   PAST SURGICAL HISTORY:  Cholecystectomy.   CURRENT MEDICATIONS:  Zilactin, Caltrate, fish oil, Diovan, Tekturna,  __________.   ALLERGIES:  No known drug allergies.   REVIEW OF SYSTEMS:  The patient denies any recent chest pain, MI, CVA,  or bleeding disorders.   PHYSICAL EXAMINATION:  GENERAL:  The patient is a well-developed, well-  nourished black female who appears at her stated age.  LUNGS:  Clear to auscultation with equal breath sounds bilaterally.  HEART:  Regular rate and rhythm without S3, S4, or murmurs.  ABDOMEN:  Soft, nontender, nondistended.  No hepatosplenomegaly or  masses are noted.  RECTAL:  Internal hemorrhoidal disease noted along the right lateral  aspect of the anus.   No significant rectal stenosis is noted.  Sphincter tone is fair.   IMPRESSION:  Hemorrhoidal disease.   PLAN:  The patient was scheduled for hemorrhoidectomy on January 11, 2008.  The risks and benefits of the procedure including bleeding,  infection, and recurrence of hemorrhoidal disease were fully explained  to the patient, gave informed  consent.      Dalia Heading, M.D.  Electronically Signed     MAJ/MEDQ  D:  12/24/2007  T:  12/25/2007  Job:  [id number]   cc:   [name], M.D.  Fax: [phone]   Kassie Mends, M.D.  [address]  [place] , [place] [address]

## 2010-05-31 ENCOUNTER — Telehealth: Payer: Self-pay

## 2010-05-31 NOTE — Telephone Encounter (Signed)
Rx called to [facility]. LMOM for pt that it has been called in.

## 2010-05-31 NOTE — Telephone Encounter (Signed)
Pt came by office. Said she restarted the Omeprazole and took 30 min prior to breakfast as instructed. She is getting the colic again. But if she holds it she gets indigestion. Would like to try a different PPI if possible. Please advise! [facility])

## 2010-05-31 NOTE — Telephone Encounter (Signed)
Please call in Rx for Protonix 40 mg 30 minutes prior to first meal, #30, [id number]. Call pt and let her know it's been called in.

## 2010-06-01 NOTE — Op Note (Signed)
Deborah Farrell, Deborah Farrell               ACCOUNT NO.:  [id number]   MEDICAL RECORD NO.:  [id number]          PATIENT TYPE:  AMB   LOCATION:  DAY                           FACILITY:  [facility]   PHYSICIAN:  Kassie Mends, M.D.      DATE OF BIRTH:  09-13-1917   DATE OF PROCEDURE:  09/11/2005  DATE OF DISCHARGE:                                 OPERATIVE REPORT   REFERRING PHYSICIAN:  Edward L. Juanetta Gosling, M.D.   INDICATION FOR EXAM:  Deborah Farrell is a 77 year old female with a new onset  dyspepsia, weight loss and need for colon cancer screening.   PROCEDURE:  1. Colonoscopy with cold forceps biopsies.  2. Esophagogastroduodenoscopy with cold forceps biopsies.   FINDINGS:  1. The patient had a moderate amount of looping during the procedure which      made reaching the cecum challenging.  2 to 3 cm flat ascending colon      polyp.  Biopsies obtained via cold forceps.  Otherwise no inflammatory      changes or vascular ectasias or diverticula evident.  Normal      retroflexed view of the rectum.  2. Mild antral erythema.  Biopsies obtained of via cold forceps.      Otherwise normal esophagus without evidence of Barrett's.  Normal      duodenum.   RECOMMENDATIONS:  1. Biopsies of colon polyps sent rush.  The patient has a follow-up      appointment to see me next Thursday or Friday.  We will discuss the      need for endoscopic mucosal resection or surgery at that time.  2. The patient asked not to use aspirin, NSAIDs or anticoagulation.   MEDICATIONS:  1. Colonoscopy:  Demerol 100 mg IV, Versed 4 mg IV.  2. Esophagogastroduodenoscopy:  Versed 1 mg IV.   PROCEDURE TECHNIQUE:  Physical exam was performed and informed consent was  obtained from the patient after explaining the benefits, risks and  alternatives to the procedure.  The patient was connected to monitor placed  in left lateral position.  Continuous oxygen was provided by nasal cannula  IV medicine administered through an indwelling  cannula.  After  administration of sedation and rectal exam, the patient's rectum was  intubated.  The scope was advanced under direct visualization to the cecun.  The scope was subsequently moved slowly by carefully examining color,  texture, anatomy and integrity of the mucosa on the way out.   After the colonoscopy, the patient's esophagus was intubated and scope was  advanced under direct visualization to the second portion of duodenum.  Scope was subsequently removed slowly by carefully examining the color,  texture, anatomy and integrity of mucosa on the way out.  The patient was  recovered in the endoscopy suite and discharged home in satisfactory  condition.      Kassie Mends, M.D.  Electronically Signed     SM/MEDQ  D:  09/11/2005  T:  09/12/2005  Job:  [id number]   cc:   Ramon Dredge L. Juanetta Gosling, M.D.  Fax: [phone]

## 2010-07-05 ENCOUNTER — Telehealth: Payer: Self-pay

## 2010-07-05 NOTE — Telephone Encounter (Signed)
Pt informed

## 2010-07-05 NOTE — Telephone Encounter (Signed)
Please call pt. Those pills should not cause pain. Try taking the pills with food.

## 2010-07-05 NOTE — Telephone Encounter (Signed)
Pt said she is having right side pain after she takes her Alprazolam and also after she takes Tylenol. She is not sure if the other meds are causing any of the pain or not. I updated med list.

## 2010-07-25 ENCOUNTER — Ambulatory Visit (INDEPENDENT_AMBULATORY_CARE_PROVIDER_SITE_OTHER): Payer: Medicare Other | Admitting: Gastroenterology

## 2010-07-25 DIAGNOSIS — R109 Unspecified abdominal pain: Secondary | ICD-10-CM

## 2010-07-25 DIAGNOSIS — D126 Benign neoplasm of colon, unspecified: Secondary | ICD-10-CM

## 2010-07-25 NOTE — Progress Notes (Signed)
Subjective:    Patient ID: Deborah Farrell, female    DOB: 1917/03/28, 77 y.o.   MRN: [id number]  PCP: [name]  HPI Stopped taking OMP because she had upset stomach. No diarrhea. Bms: every day. No nausea or vomiting, dysuria, hematuria, blood in stool, melena. Appetite: good. No problems swallowing. Pantoprazole helps heartburn. Still has gas-Sx 2-3/wk when she gets up in the AM. Just goes away-< 30 mins. Appetite is good and weight is stable. Can have pain in right side-1x/wk, Sx over a year, lasts half a day. Uses heating pad for relief. Can't tolerate dicyclomine or hyoscyamine.   Past Medical History  Diagnosis Date  . Colon polyp     ascending colon  . Tubulovillous adenoma 2007 [facility]    with multifocal high grade dysplasia  . Non-ulcer dyspepsia   . Hemorrhoids   . IBS (irritable bowel syndrome)   . HTN (hypertension)   . Glaucoma   . Tubulovillous adenoma 2008  ASC COLON W/O DYSPLASIA   . Adenoma of large intestine 2009 ASC COLON SIMPLE    2011 [facility] COLON SIMPLE  . Allergy history, radiographic dye     Past Surgical History  Procedure Date  . Appendectomy   . Vaginal hysterectomy   . Removal of ovarian cyst   . Colonoscopy 04 August 2006    diverticulitis diagnosed, 1mm sessile polyp removed inascending colon. area tattood for future observation and reccomended f/u colonoscopy in one year   Allergies  Allergen Reactions  . Dicyclomine Hcl     REACTION: Unknown reaction  . Hyoscyamine Sulfate     REACTION: Unknown reaction  . Omeprazole Other (See Comments)    GI upset  . Sulfonamide Derivatives     REACTION: Unknown reaction   Current Outpatient Prescriptions  Medication Sig Dispense Refill  . acetaminophen (TYLENOL) 500 MG tablet Take 500 mg by mouth every 6 (six) hours as needed. As needed       . ALPRAZolam (XANAX) 0.25 MG tablet Take 0.25 mg by mouth at bedtime as needed. One tablet every 6 hours as needed       . amLODipine-valsartan (EXFORGE) 10-160 MG per  tablet Take by mouth daily. Exforge 5/320 mg  One daily      . amLODipine-valsartan (EXFORGE) 5-320 MG per tablet Take 1 tablet by mouth daily.        . Calcium Carbonate-Vitamin D (CALTRATE 600+D) 600-400 MG-UNIT per chew tablet Chew 1 tablet by mouth daily.        . carvedilol (COREG) 3.125 MG tablet Take 3.125 mg by mouth 4 (four) times daily.        . fish oil-omega-3 fatty acids 1000 MG capsule Take 1 g by mouth daily.        Marland Kitchen latanoprost (XALATAN) 0.005 % ophthalmic solution Place 1 drop into both eyes at bedtime.        . pantoprazole (PROTONIX) 40 MG tablet Take 40 mg by mouth daily.        . Probiotic Product ([facility] PO) Take by mouth.        . solifenacin (VESICARE) 5 MG tablet Take 5 mg by mouth daily.        Marland Kitchen amLODipine-benazepril (LOTREL) 5-20 MG per capsule Take 1 capsule by mouth daily.        . carvedilol (COREG) 12.5 MG tablet Take 12.5 mg by mouth 2 (two) times daily with a meal. Take two tablets twice daly      .  valsartan (DIOVAN) 80 MG tablet Take 160 mg by mouth daily.           Review of Systems     Objective:   Physical Exam  Vitals reviewed. Constitutional: She is oriented to person, place, and time. She appears well-developed and well-nourished.  HENT:  Head: Normocephalic and atraumatic.  Cardiovascular: Normal rate, regular rhythm and normal heart sounds.   Pulmonary/Chest: Effort normal and breath sounds normal.  Abdominal: Soft. Bowel sounds are normal. She exhibits no distension. There is no tenderness.  Musculoskeletal: She exhibits no edema.  Neurological: She is alert and oriented to person, place, and time.          Assessment & Plan:

## 2010-07-25 NOTE — Progress Notes (Signed)
Cc to PCP 

## 2010-07-25 NOTE — Assessment & Plan Note (Addendum)
MOST LIKELY 2o TO INTESTINAL GAS.  AVOID FOODS THAT MAY INCREASE BOWEL GAS. See HO. May try one Imodium during episodes of right side pain. Continue pantoprazole and [facility]. FOLLOW UP IN 3 MOS.

## 2010-07-25 NOTE — Patient Instructions (Signed)
AVOID FOODS THAT MAY INCREASE BOWEL GAS. See HO. May try one Imodium during episodes of right side pain. Continue pantoprazole and [facility]. FOLLOW UP IN 3 MOS.

## 2010-07-26 ENCOUNTER — Ambulatory Visit: Payer: Medicare Other | Admitting: Gastroenterology

## 2010-07-31 NOTE — Progress Notes (Signed)
Pt scheduled to see LL 10/23/10

## 2010-08-04 ENCOUNTER — Encounter (HOSPITAL_COMMUNITY): Payer: Self-pay | Admitting: *Deleted

## 2010-08-04 ENCOUNTER — Emergency Department (HOSPITAL_COMMUNITY)
Admission: EM | Admit: 2010-08-04 | Discharge: 2010-08-04 | Disposition: A | Discharge status: Home / Self Care | Payer: Medicare Other | Attending: Emergency Medicine | Admitting: Emergency Medicine

## 2010-08-04 DIAGNOSIS — R109 Unspecified abdominal pain: Secondary | ICD-10-CM | POA: Insufficient documentation

## 2010-08-04 DIAGNOSIS — R300 Dysuria: Secondary | ICD-10-CM | POA: Insufficient documentation

## 2010-08-04 DIAGNOSIS — N390 Urinary tract infection, site not specified: Secondary | ICD-10-CM

## 2010-08-04 DIAGNOSIS — R350 Frequency of micturition: Secondary | ICD-10-CM | POA: Insufficient documentation

## 2010-08-04 HISTORY — DX: Urinary tract infection, site not specified: N39.0

## 2010-08-04 LAB — URINALYSIS, ROUTINE W REFLEX MICROSCOPIC
Glucose, UA: NEGATIVE mg/dL
Ketones, ur: NEGATIVE mg/dL
Nitrite: NEGATIVE
Protein, ur: NEGATIVE mg/dL

## 2010-08-04 LAB — URINE MICROSCOPIC-ADD ON

## 2010-08-04 MED ORDER — CEPHALEXIN 500 MG PO CAPS: 500.0000 mg | ORAL_CAPSULE | Freq: Three times a day (TID) | ORAL | Status: AC

## 2010-08-04 MED ORDER — CEPHALEXIN 500 MG PO CAPS
500.0000 mg | ORAL_CAPSULE | Freq: Once | ORAL | Status: AC
  Administered 2010-08-04: 500 mg via ORAL
  Filled 2010-08-04: qty 1

## 2010-08-04 NOTE — ED Notes (Signed)
C/o dysuria and frequency.

## 2010-08-04 NOTE — ED Provider Notes (Signed)
History     Chief Complaint  Patient presents with  . Urinary frequency    Patient is a 77 y.o. female presenting with frequency.  Urinary Frequency This is a new problem. The current episode started 6 to 12 hours ago. The problem occurs constantly. The problem has not changed since onset.Associated symptoms comments: No fever or chills. She does have dysuria and urinary urgency. Mild suprapubic pain with urination. No flank pain. No nausea or vomiting. Symptoms are severe. She rates her pain at 7/10 when she urinates, currently in no pain. She has had similar symptoms before.. The symptoms are aggravated by nothing. The symptoms are relieved by nothing. She has tried nothing for the symptoms.    Past Medical History  Diagnosis Date  . Colon polyp     ascending colon  . Tubulovillous adenoma 2007 [facility]    with multifocal high grade dysplasia  . Non-ulcer dyspepsia   . Hemorrhoids   . IBS (irritable bowel syndrome)   . HTN (hypertension)   . Glaucoma   . Tubulovillous adenoma 2008  ASC COLON W/O DYSPLASIA   . Adenoma of large intestine 2009 [facility] COLON SIMPLE    2011 [facility] COLON SIMPLE  . Allergy history, radiographic dye   . Urinary tract infection     Past Surgical History  Procedure Date  . Appendectomy   . Vaginal hysterectomy   . Removal of ovarian cyst   . Colonoscopy 04 August 2006    diverticulitis diagnosed, 1mm sessile polyp removed inascending colon. area tattood for future observation and reccomended f/u colonoscopy in one year    No family history on file.  History  Substance Use Topics  . Smoking status: Never Smoker   . Smokeless tobacco: Never Used  . Alcohol Use: No    OB History    Grav Para Term Preterm Abortions TAB SAB Ect Mult Living                  Review of Systems  Genitourinary: Positive for frequency.  All other systems reviewed and are negative.    Physical Exam  BP 135/56  Pulse 68  Temp(Src) 99.3 F (37.4 C) (Oral)  Resp 16   Ht 5\' 6"  (1.676 m)  Wt 157 lb (71.215 kg)  BMI 25.34 kg/m2  SpO2 97%  Physical Exam  Nursing note and vitals reviewed. Constitutional: She is oriented to person, place, and time. She appears well-developed and well-nourished. No distress.  HENT:  Head: Normocephalic and atraumatic.  Right Ear: External ear normal.  Left Ear: External ear normal.  Mouth/Throat: Oropharynx is clear and moist.  Eyes: EOM are normal. Pupils are equal, round, and reactive to light. No scleral icterus.  Neck: Normal range of motion. Neck supple. No JVD present.  Cardiovascular: Normal rate, regular rhythm and normal heart sounds.   No murmur heard. Pulmonary/Chest: Effort normal and breath sounds normal. She has no wheezes. She has no rales.  Abdominal: Soft. Bowel sounds are normal. She exhibits no mass. There is no tenderness.       No CVA tenderness.  Musculoskeletal: Normal range of motion. She exhibits no edema.  Lymphadenopathy:    She has no cervical adenopathy.  Neurological: She is alert and oriented to person, place, and time. No cranial nerve deficit. Coordination normal.  Skin: Skin is warm and dry. No rash noted.  Psychiatric: She has a normal mood and affect.    ED Course  Procedures  MDM Keflex started for  UTI.      Dione Booze, MD 08/04/10 1011

## 2010-08-04 NOTE — ED Notes (Signed)
MD at bedside. 

## 2010-08-04 NOTE — ED Notes (Signed)
Pt states, "I think i have cystitis" . Symptoms include urinary frequency, lower back pain and lower abdominal pressure with urination. Symptoms began last night.

## 2010-08-08 ENCOUNTER — Other Ambulatory Visit (HOSPITAL_COMMUNITY): Payer: Self-pay | Admitting: Urology

## 2010-08-08 DIAGNOSIS — R3915 Urgency of urination: Secondary | ICD-10-CM

## 2010-08-08 DIAGNOSIS — Z9289 Personal history of other medical treatment: Secondary | ICD-10-CM

## 2010-08-08 DIAGNOSIS — N390 Urinary tract infection, site not specified: Secondary | ICD-10-CM

## 2010-08-10 ENCOUNTER — Ambulatory Visit (HOSPITAL_COMMUNITY)
Admission: RE | Admit: 2010-08-10 | Discharge: 2010-08-10 | Disposition: A | Discharge status: Home / Self Care | Payer: Medicare Other | Source: Ambulatory Visit | Attending: Urology | Admitting: Urology

## 2010-08-10 DIAGNOSIS — N390 Urinary tract infection, site not specified: Secondary | ICD-10-CM | POA: Insufficient documentation

## 2010-08-10 DIAGNOSIS — Q619 Cystic kidney disease, unspecified: Secondary | ICD-10-CM | POA: Insufficient documentation

## 2010-08-10 DIAGNOSIS — Z9289 Personal history of other medical treatment: Secondary | ICD-10-CM

## 2010-08-10 DIAGNOSIS — R3915 Urgency of urination: Secondary | ICD-10-CM

## 2010-08-18 ENCOUNTER — Emergency Department (HOSPITAL_COMMUNITY)
Admission: EM | Admit: 2010-08-18 | Discharge: 2010-08-18 | Disposition: A | Discharge status: Home / Self Care | Payer: Medicare Other | Attending: Emergency Medicine | Admitting: Emergency Medicine

## 2010-08-18 ENCOUNTER — Encounter (HOSPITAL_COMMUNITY): Payer: Self-pay

## 2010-08-18 DIAGNOSIS — K589 Irritable bowel syndrome without diarrhea: Secondary | ICD-10-CM | POA: Insufficient documentation

## 2010-08-18 DIAGNOSIS — I10 Essential (primary) hypertension: Secondary | ICD-10-CM | POA: Insufficient documentation

## 2010-08-18 DIAGNOSIS — N390 Urinary tract infection, site not specified: Secondary | ICD-10-CM | POA: Insufficient documentation

## 2010-08-18 LAB — URINALYSIS, ROUTINE W REFLEX MICROSCOPIC
Glucose, UA: NEGATIVE mg/dL
Ketones, ur: NEGATIVE mg/dL
Protein, ur: 100 mg/dL — AB
pH: 6 (ref 5.0–8.0)

## 2010-08-18 LAB — URINE MICROSCOPIC-ADD ON

## 2010-08-18 MED ORDER — CIPROFLOXACIN HCL 500 MG PO TABS: 500.0000 mg | ORAL_TABLET | Freq: Two times a day (BID) | ORAL | Status: AC

## 2010-08-18 NOTE — ED Provider Notes (Signed)
History     CSN: [id number] Arrival date & time: 08/18/2010  6:49 AM  Chief Complaint  Patient presents with  . Abdominal Pain   HPI Comments: Recently treated for uti which improved, off antibiotics, now returning.    Patient is a 77 y.o. female presenting with abdominal pain. The history is provided by the patient.  Abdominal Pain The primary symptoms of the illness include abdominal pain and dysuria. The primary symptoms of the illness do not include fever, shortness of breath, nausea, vomiting or diarrhea. The current episode started more than 2 days ago. The onset of the illness was gradual. The problem has been gradually worsening.  The dysuria is associated with frequency. The dysuria is not associated with hematuria.  Additional symptoms associated with the illness include frequency. Symptoms associated with the illness do not include chills, constipation or hematuria.    Past Medical History  Diagnosis Date  . Colon polyp     ascending colon  . Tubulovillous adenoma 2007 EMR [facility]    with multifocal high grade dysplasia  . Non-ulcer dyspepsia   . Hemorrhoids   . IBS (irritable bowel syndrome)   . HTN (hypertension)   . Glaucoma   . Tubulovillous adenoma 2008  ASC COLON W/O DYSPLASIA   . Adenoma of large intestine 2009 ASC COLON SIMPLE    2011 ASC COLON SIMPLE  . Allergy history, radiographic dye   . Urinary tract infection     Past Surgical History  Procedure Date  . Appendectomy   . Vaginal hysterectomy   . Removal of ovarian cyst   . Colonoscopy 04 August 2006    diverticulitis diagnosed, 1mm sessile polyp removed inascending colon. area tattood for future observation and reccomended f/u colonoscopy in one year    No family history on file.  History  Substance Use Topics  . Smoking status: Never Smoker   . Smokeless tobacco: Never Used  . Alcohol Use: No    OB History    Grav Para Term Preterm Abortions TAB SAB Ect Mult Living                  Review  of Systems  Constitutional: Negative for fever and chills.  HENT: Negative.   Respiratory: Negative.  Negative for cough, chest tightness and shortness of breath.   Cardiovascular: Negative for chest pain and palpitations.  Gastrointestinal: Positive for abdominal pain. Negative for nausea, vomiting, diarrhea and constipation.  Genitourinary: Positive for dysuria and frequency. Negative for hematuria and flank pain.  All other systems reviewed and are negative.    Physical Exam  BP 137/67  Pulse 67  Temp(Src) 98.4 F (36.9 C) (Oral)  Resp 16  Ht 5\' 6"  (1.676 m)  Wt 154 lb (69.854 kg)  BMI 24.86 kg/m2  SpO2 98%  Physical Exam  Constitutional: She is oriented to person, place, and time. She appears well-developed and well-nourished. No distress.  HENT:  Head: Normocephalic and atraumatic.  Neck: Normal range of motion. Neck supple.  Cardiovascular: Normal rate and regular rhythm.  Exam reveals no gallop and no friction rub.   No murmur heard. Pulmonary/Chest: Effort normal and breath sounds normal. No respiratory distress. She has no wheezes. She has no rales.  Abdominal: Soft. Bowel sounds are normal. She exhibits no distension. There is no tenderness. There is no rebound and no guarding.  Musculoskeletal: Normal range of motion.  Neurological: She is alert and oriented to person, place, and time.  Skin: Skin is warm and  dry. She is not diaphoretic.    ED Course  Procedures  MDM The UA shows some bacteria and leukocytes.  Abdomen benign.  Will treat as uti, follow up prn.          Geoffery Lyons, MD 08/18/10 [phone]

## 2010-08-18 NOTE — ED Notes (Signed)
Recurrent uti sx

## 2010-08-18 NOTE — ED Notes (Signed)
Pt complain of burning with urination. States she was recently tx for a uti with keflex

## 2010-10-09 ENCOUNTER — Encounter: Payer: Self-pay | Admitting: Gastroenterology

## 2010-10-15 LAB — BASIC METABOLIC PANEL
CO2: 28
Calcium: 10.2
Creatinine, Ser: 0.89
Glucose, Bld: 102 — ABNORMAL HIGH

## 2010-10-15 LAB — URINE CULTURE
Colony Count: NO GROWTH
Culture: NO GROWTH

## 2010-10-15 LAB — URINALYSIS, ROUTINE W REFLEX MICROSCOPIC
Bilirubin Urine: NEGATIVE
Ketones, ur: NEGATIVE
Protein, ur: NEGATIVE
Urobilinogen, UA: 0.2

## 2010-10-15 LAB — DIFFERENTIAL
Basophils Relative: 0
Eosinophils Absolute: 0.1
Monocytes Absolute: 0.6
Neutro Abs: 4.8
Neutrophils Relative %: 68

## 2010-10-15 LAB — CBC
HCT: 34.9 — ABNORMAL LOW
WBC: 7.1

## 2010-10-16 LAB — URINALYSIS, ROUTINE W REFLEX MICROSCOPIC
Bilirubin Urine: NEGATIVE
Ketones, ur: NEGATIVE
Nitrite: NEGATIVE
Urobilinogen, UA: 0.2
pH: 6

## 2010-10-18 LAB — CBC
HCT: 30.8 % — ABNORMAL LOW (ref 36.0–46.0)
MCV: 95.3 fL (ref 78.0–100.0)
Platelets: 313 10*3/uL (ref 150–400)
RDW: 13.4 % (ref 11.5–15.5)

## 2010-10-18 LAB — BASIC METABOLIC PANEL
BUN: 42 mg/dL — ABNORMAL HIGH (ref 6–23)
GFR calc non Af Amer: 29 mL/min — ABNORMAL LOW (ref >60)
Glucose, Bld: 94 mg/dL (ref 70–99)
Potassium: 4.9 mEq/L (ref 3.5–5.1)

## 2010-10-25 ENCOUNTER — Encounter: Payer: Self-pay | Admitting: Cardiology

## 2010-10-29 ENCOUNTER — Ambulatory Visit (INDEPENDENT_AMBULATORY_CARE_PROVIDER_SITE_OTHER): Payer: Medicare Other | Admitting: Cardiology

## 2010-10-29 ENCOUNTER — Encounter: Payer: Self-pay | Admitting: Cardiology

## 2010-10-29 DIAGNOSIS — D649 Anemia, unspecified: Secondary | ICD-10-CM

## 2010-10-29 DIAGNOSIS — I10 Essential (primary) hypertension: Secondary | ICD-10-CM

## 2010-10-29 LAB — URINE CULTURE: Colony Count: 8000

## 2010-10-29 LAB — URINALYSIS, ROUTINE W REFLEX MICROSCOPIC
Bilirubin Urine: NEGATIVE
Nitrite: NEGATIVE
Protein, ur: NEGATIVE
Specific Gravity, Urine: 1.015
Urobilinogen, UA: 0.2

## 2010-10-29 MED ORDER — CHLORTHALIDONE 25 MG PO TABS: 25.0000 mg | ORAL_TABLET | Freq: Every day | ORAL | Status: DC

## 2010-10-29 NOTE — Patient Instructions (Signed)
Your physician wants you to follow-up in: 3 weeks   Your physician has recommended you make the following change in your medication:  1.) Start Chlorthalidone 25mg  daily  Your physician recommends that you return for lab work in: 2 Weeks (BMET)

## 2010-10-29 NOTE — Assessment & Plan Note (Signed)
She has been seen and examined by myself and Dr.Rothbart. Orthostatics have been completed and are found to be negative.  She has an aspect of anxiety as well as chronic hypertension in general. Reviewed renal ultrasound and found to be negative for RAS. Review of labs demonstrates Creat 1.12 with GFR of 34.    She will be placed on chlorothalidone, 25 mg daily. Follow-up BP records will be completed by the patient. She is asked to take the BP at the same time everyday, and take her medications at the same time everyday as well.  She will not have any testing completed at this time i.e. Echocardiogram.  We will follow-up with a BMET in 2 weeks and office visit in 3 weeks.

## 2010-10-29 NOTE — Progress Notes (Signed)
HPI:Deborah Farrell is a 77 y/o patient we are seeing at the kind request of Dr. Dwana Melena for assessment and treatment of difficult to control hypertension. She has not been seen by a cardiologist is the past, or had any cardiac testing. She is followed also by Dr. Fausto Skillern and has had a renal ultrasound completed on 08/10/2010 which demonstrated no evidence of renal artery disease, no evidence for hydronephrosis.    She is recording BP at home as high as 200 systolic. She says it is higher in the morning.  She also admits to a lot of anxiety with family issues that cause her BP to go up. If she takes a xanax the BP does come down.  She states that the afternoon BP is usually normal in the 130's or 140's. As a result of her labile BP, we are asked for further recommendations and management of hypertension.  Allergies  Allergen Reactions  . Dicyclomine Hcl     REACTION: Unknown reaction  . Hyoscyamine Sulfate     REACTION: Unknown reaction  . Omeprazole Other (See Comments)    GI upset  . Sulfonamide Derivatives     REACTION: Unknown reaction    Current Outpatient Prescriptions  Medication Sig Dispense Refill  . acetaminophen (TYLENOL) 500 MG tablet Take 500 mg by mouth every 6 (six) hours as needed. As needed       . ALPRAZolam (XANAX) 0.25 MG tablet Take 0.25 mg by mouth at bedtime as needed. One tablet every 6 hours as needed       . amLODipine-valsartan (EXFORGE) 5-320 MG per tablet Take 1 tablet by mouth daily.        . Calcium Carbonate-Vitamin D (CALTRATE 600+D) 600-400 MG-UNIT per chew tablet Chew 1 tablet by mouth daily.        . carvedilol (COREG) 12.5 MG tablet Take 6.25 mg by mouth 2 (two) times daily with a meal. Take two tablets twice daly      . fish oil-omega-3 fatty acids 1000 MG capsule Take 1 g by mouth daily.        Marland Kitchen latanoprost (XALATAN) 0.005 % ophthalmic solution Place 1 drop into both eyes at bedtime.        . Multiple Vitamins-Minerals (MULTI COMPLETE PO) Take by mouth.         . pantoprazole (PROTONIX) 40 MG tablet Take 40 mg by mouth daily.        . Probiotic Product (PHILLIPS COLON HEALTH PO) Take by mouth.        . solifenacin (VESICARE) 5 MG tablet Take 5 mg by mouth daily.        . chlorthalidone (HYGROTON) 25 MG tablet Take 1 tablet (25 mg total) by mouth daily.  30 tablet  12    Past Medical History  Diagnosis Date  . Colon polyp     ascending colon  . Tubulovillous adenoma 2007 EMR [facility]    with multifocal high grade dysplasia  . Non-ulcer dyspepsia   . Hemorrhoids   . IBS (irritable bowel syndrome)   . HTN (hypertension)   . Glaucoma   . Tubulovillous adenoma 2008  ASC COLON W/O DYSPLASIA   . Adenoma of large intestine 2009 ASC COLON SIMPLE    2011 ASC COLON SIMPLE  . Allergy history, radiographic dye   . Urinary tract infection     Past Surgical History  Procedure Date  . Appendectomy   . Vaginal hysterectomy   . Removal of ovarian cyst   .  Colonoscopy 04 August 2006    diverticulitis diagnosed, 1mm sessile polyp removed inascending colon. area tattood for future observation and reccomended f/u colonoscopy in one year    Family History  Problem Relation Age of Onset  . Stroke Mother   . Heart attack Sister   . Heart disease Sister     History   Social History  . Marital Status: Widowed    Spouse Name: N/A    Number of Children: N/A  . Years of Education: N/A   Occupational History  . Not on file.   Social History Main Topics  . Smoking status: Never Smoker   . Smokeless tobacco: Never Used  . Alcohol Use: No  . Drug Use: No  . Sexually Active: No   Other Topics Concern  . Not on file   Social History Narrative  . No narrative on file    ZOX:WRUEAV of systems complete and found to be negative unless listed above  PHYSICAL EXAM BP 185/83  Pulse 70  Resp 18  Ht 5\' 6"  (1.676 m)  Wt 157 lb (71.215 kg)  BMI 25.34 kg/m2  General: Well developed, well nourished, in no acute distress Head: Eyes PERRLA, No  xanthomas.   Normal cephalic and atramatic  Lungs: Clear bilaterally to auscultation and percussion. Heart: HRRR S1 S2, soft S4.  Pulses are 2+ & equal.            Right sided carotid bruit. No JVD.  No abdominal bruits. No femoral bruits. Abdomen: Bowel sounds are positive, abdomen soft and non-tender without masses or                  Hernia's noted. Msk:  Back normal, normal gait. Normal strength and tone for age. Extremities: No clubbing, cyanosis, 1+ edema.  DP +1 Neuro: Alert and oriented X 3. Psych:  Good affect, responds appropriately  EKG: NSR rate of 64 bpm.  Minimal voltage for LVH. Nonspecific T-wave abnormality in the lateral leads.  ASSESSMENT AND PLAN

## 2010-10-30 ENCOUNTER — Telehealth: Payer: Self-pay | Admitting: Cardiology

## 2010-10-30 ENCOUNTER — Other Ambulatory Visit: Payer: Self-pay | Admitting: *Deleted

## 2010-10-30 MED ORDER — CHLORTHALIDONE 25 MG PO TABS: 25.0000 mg | ORAL_TABLET | Freq: Every day | ORAL | Status: DC

## 2010-10-30 NOTE — Telephone Encounter (Signed)
Pt needs rx called in to [facility] in [place] for chlorthalidone 25mg  new rx for 10/29/10 visit. She states that pharmacy never got request.

## 2010-11-05 ENCOUNTER — Encounter: Payer: Self-pay | Admitting: *Deleted

## 2010-11-08 LAB — BASIC METABOLIC PANEL
Potassium: 4.8 mEq/L (ref 3.5–5.3)
Sodium: 131 mEq/L — ABNORMAL LOW (ref 135–145)

## 2010-11-13 ENCOUNTER — Ambulatory Visit (INDEPENDENT_AMBULATORY_CARE_PROVIDER_SITE_OTHER): Payer: Medicare Other | Admitting: Gastroenterology

## 2010-11-13 ENCOUNTER — Encounter: Payer: Self-pay | Admitting: Gastroenterology

## 2010-11-13 VITALS — BP 133/69 | HR 71 | Temp 97.4°F | Ht 65.0 in | Wt 155.2 lb

## 2010-11-13 DIAGNOSIS — K3189 Other diseases of stomach and duodenum: Secondary | ICD-10-CM

## 2010-11-13 DIAGNOSIS — R1013 Epigastric pain: Secondary | ICD-10-CM

## 2010-11-13 DIAGNOSIS — K30 Functional dyspepsia: Secondary | ICD-10-CM

## 2010-11-13 DIAGNOSIS — K5900 Constipation, unspecified: Secondary | ICD-10-CM

## 2010-11-13 DIAGNOSIS — K589 Irritable bowel syndrome without diarrhea: Secondary | ICD-10-CM

## 2010-11-13 NOTE — Progress Notes (Signed)
Primary Care Physician: Dwana Melena, MD  Primary Gastroenterologist:  Jonette Eva, MD   Chief Complaint  Patient presents with  . Constipation  . Abdominal Pain    HPI: Deborah Farrell is a 77 y.o. female here for three-month followup. Overall she has been doing well however she has noted constipation since starting chlorthalidone. She started taking Colace about a week ago. She's taking 2-3 daily. Having BM most days but stools can be hard. She admits she has not been consistent with the medication as of yet. Denies any blood in stool. No abnormal pain. Her appetite has been good. Seems to be doing well on pantoprazole. Does not complain of any bloating or gas. Overall feeling well. She states she is lucky that she did not bang this weekend when she had a significant fall. Denies any pain or soreness.  Current Outpatient Prescriptions  Medication Sig Dispense Refill  . acetaminophen (TYLENOL) 500 MG tablet Take 500 mg by mouth every 6 (six) hours as needed. As needed       . ALPRAZolam (XANAX) 0.25 MG tablet Take 0.25 mg by mouth at bedtime as needed. One tablet every 6 hours as needed       . amLODipine-valsartan (EXFORGE) 5-320 MG per tablet Take 1 tablet by mouth daily.        . Calcium Carbonate-Vitamin D (CALTRATE 600+D) 600-400 MG-UNIT per chew tablet Chew 1 tablet by mouth daily.        . carvedilol (COREG) 6.25 MG tablet Take 6.25 mg by mouth 2 (two) times daily with a meal.       . chlorthalidone (HYGROTON) 25 MG tablet Take 1 tablet (25 mg total) by mouth daily.  90 tablet  2  . docusate sodium (COLACE) 100 MG capsule Take 100 mg by mouth 2 (two) times daily.        . fish oil-omega-3 fatty acids 1000 MG capsule Take 1 g by mouth daily.        Marland Kitchen latanoprost (XALATAN) 0.005 % ophthalmic solution Place 1 drop into both eyes at bedtime.        . Multiple Vitamins-Minerals (MULTI COMPLETE PO) Take by mouth.        . pantoprazole (PROTONIX) 40 MG tablet Take 40 mg by mouth daily.          . Probiotic Product (PHILLIPS COLON HEALTH PO) Take by mouth.        . solifenacin (VESICARE) 5 MG tablet Take 5 mg by mouth daily.          Allergies as of 11/13/2010 - Review Complete 11/13/2010  Allergen Reaction Noted  . Dicyclomine hcl    . Hyoscyamine sulfate    . Omeprazole Other (See Comments) 07/25/2010  . Sulfonamide derivatives      ROS:  General: Negative for anorexia, weight loss, fever, chills, fatigue, weakness. ENT: Negative for hoarseness, difficulty swallowing , nasal congestion. CV: Negative for chest pain, angina, palpitations, dyspnea on exertion, peripheral edema.  Respiratory: Negative for dyspnea at rest, dyspnea on exertion, cough, sputum, wheezing.  GI: See history of present illness. GU:  Negative for dysuria, hematuria, urinary incontinence, urinary frequency, nocturnal urination.  Endo: Negative for unusual weight change.    Physical Examination:   BP 133/69  Pulse 71  Temp(Src) 97.4 F (36.3 C) (Temporal)  Ht 5\' 5"  (1.651 m)  Wt 155 lb 3.2 oz (70.398 kg)  BMI 25.83 kg/m2  General: Well-nourished, well-developed in no acute distress. Elderly, appears much younger than  stated age. Eyes: No icterus. Mouth: Oropharyngeal mucosa moist and pink , no lesions erythema or exudate. Lungs: Clear to auscultation bilaterally.  Heart: Regular rate and rhythm, no murmurs rubs or gallops.  Abdomen: Bowel sounds are normal, nontender, nondistended, no hepatosplenomegaly or masses, no abdominal bruits or hernia , no rebound or guarding.   Extremities: No lower extremity edema. No clubbing or deformities. Neuro: Alert and oriented x 4   Skin: Warm and dry, no jaundice.   Psych: Alert and cooperative, normal mood and affect.

## 2010-11-13 NOTE — Progress Notes (Signed)
Cc to PCP 

## 2010-11-13 NOTE — Patient Instructions (Signed)
For constipation: You may take colace (stool softner). Try taking two at breakfast daily. If you do not have a stool or your stool is hard, you may take an additional colace at bedtime. Do not take more than 300mg  daily (this is generally three tablets). If you continue to have constipation you may need something stronger like Miralax. You can buy this over-the-counter. You may take one capful daily. Please call with any problems. Come back to see Dr. Darrick Penna in six months.

## 2010-11-13 NOTE — Progress Notes (Signed)
Reminder in epic to follow up in 6 months with SF °

## 2010-11-13 NOTE — Progress Notes (Signed)
REVIEWED. AGREE. 

## 2010-11-13 NOTE — Assessment & Plan Note (Signed)
Overall doing well. She's had some constipation related to chlorthalidone. She has brought some Colace and started it about a week ago. She may continue as long as it seems to be helping up to 3 daily. If not good enough, she may add MiraLax 17 g daily. Would avoid stimulants given her history of irritable bowel syndrome.  She seems to be doing well with regards to dyspepsia and GERD at this time. Continue current regimen.  Office visit in 6 months with Dr. Darrick Penna. She may call sooner if she has any problems.

## 2010-11-20 ENCOUNTER — Encounter: Payer: Self-pay | Admitting: Cardiology

## 2010-11-21 ENCOUNTER — Ambulatory Visit (INDEPENDENT_AMBULATORY_CARE_PROVIDER_SITE_OTHER): Payer: Medicare Other | Admitting: Cardiology

## 2010-11-21 ENCOUNTER — Encounter: Payer: Self-pay | Admitting: Cardiology

## 2010-11-21 DIAGNOSIS — I10 Essential (primary) hypertension: Secondary | ICD-10-CM

## 2010-11-21 DIAGNOSIS — D369 Benign neoplasm, unspecified site: Secondary | ICD-10-CM | POA: Insufficient documentation

## 2010-11-21 DIAGNOSIS — E785 Hyperlipidemia, unspecified: Secondary | ICD-10-CM

## 2010-11-21 MED ORDER — CARVEDILOL 12.5 MG PO TABS: 12.5000 mg | ORAL_TABLET | Freq: Two times a day (BID) | ORAL | Status: DC

## 2010-11-21 NOTE — Progress Notes (Signed)
HPI : [name] returns to the office as scheduled for continued assessment and treatment of hypertension.  Since her last visit, she has followed blood pressures at home with excellent results.  Most systolics have been less than 140 with occasionally higher values to a maximum of 170.  All diastolics are normal.  She denies any apparent adverse effects of her medications.  Current Outpatient Prescriptions on File Prior to Visit  Medication Sig Dispense Refill  . acetaminophen (TYLENOL) 500 MG tablet Take 500 mg by mouth every 6 (six) hours as needed. As needed       . ALPRAZolam (XANAX) 0.25 MG tablet Take 0.25 mg by mouth at bedtime as needed. One tablet every 6 hours as needed       . amLODipine-valsartan (EXFORGE) 5-320 MG per tablet Take 1 tablet by mouth daily.        . Calcium Carbonate-Vitamin D (CALTRATE 600+D) 600-400 MG-UNIT per chew tablet Chew 1 tablet by mouth daily.        Marland Kitchen docusate sodium (COLACE) 100 MG capsule Take 100 mg by mouth 2 (two) times daily.        . fish oil-omega-3 fatty acids 1000 MG capsule Take 1 g by mouth daily.        Marland Kitchen latanoprost (XALATAN) 0.005 % ophthalmic solution Place 1 drop into both eyes at bedtime.        . Multiple Vitamins-Minerals (MULTI COMPLETE PO) Take by mouth.        . pantoprazole (PROTONIX) 40 MG tablet Take 40 mg by mouth daily.        . Probiotic Product (PHILLIPS COLON HEALTH PO) Take by mouth.        . solifenacin (VESICARE) 5 MG tablet Take 5 mg by mouth daily.           Allergies  Allergen Reactions  . Dicyclomine Hcl     REACTION: Unknown reaction  . Hyoscyamine Sulfate     REACTION: Unknown reaction  . Iodinated Diagnostic Agents   . Omeprazole Other (See Comments)    GI upset  . Sulfonamide Derivatives     REACTION: Unknown reaction      Past medical history, social history, and family history reviewed and updated.  ROS: Denies chest discomfort, dyspnea, orthopnea or PND.  PHYSICAL EXAM: BP 125/70  Pulse 67  Wt  70.761 kg (156 lb)  General-Well developed; no acute distress; remarkably agile mind considering her advanced age. Body habitus-proportionate weight and height Neck-No JVD; no carotid bruits Lungs-clear lung fields; resonant to percussion Cardiovascular-normal PMI; normal S1 and S2; modest basilar systolic ejection murmur Abdomen-normal bowel sounds; soft and non-tender without masses or organomegaly Musculoskeletal-No deformities, no cyanosis or clubbing Neurologic-Normal cranial nerves; symmetric strength and tone Skin-Warm, no significant lesions Extremities-distal pulses intact; 1/2+ edema  ASSESSMENT AND PLAN:

## 2010-11-21 NOTE — Patient Instructions (Signed)
Your physician recommends that you schedule a follow-up appointment in: 6 months with Dr Dietrich Pates.  You will receive a reminder letter.  1 month Blood pressure check with nurse.  Your physician has recommended you make the following change in your medication:   1 - Decrease Chlorthalidone to 1/2 tablet daily 2 - Increase Coreg to 12.5 mg twice daily  Continue home blood pressures

## 2010-11-21 NOTE — Assessment & Plan Note (Signed)
Blood pressure control is excellent, but patient has developed hyponatremia with serum sodium decreasing to 131 and minimal prerenal azotemia with creatinine increasing from 1.0 to 1.33.  Chlorthalidone dosage will be decreased to 12.5 mg per day and carvedilol dosage increased to 12.5 mg b.i.d.  Patient will monitor blood pressure and heart rate at home and return in one month for a repeat blood pressure check by the cardiology nurses.

## 2010-11-21 NOTE — Assessment & Plan Note (Signed)
No recent lipid profile available; one will be obtained.

## 2010-11-22 ENCOUNTER — Encounter: Payer: Self-pay | Admitting: *Deleted

## 2010-11-26 ENCOUNTER — Telehealth: Payer: Self-pay

## 2010-11-26 NOTE — Telephone Encounter (Signed)
Pt brought her empty bottle of Pantoprazole by the office. She has no refills. She said she was not sure if Dr. Darrick Penna wanted her to stay on the Pantoprazole. Said if she is to stay on it, she would like one refill sent to [facility]. Then she would like to get 90 day supply after that, sent to [facility]. Please advise!

## 2010-11-26 NOTE — Telephone Encounter (Signed)
PT SHOULD CONTINUE ON PROTONIX 1 PO DAILY #30, RFX0 TO [facility], #94, RFX3 TO HER [facility]IN [facility].

## 2010-11-27 NOTE — Telephone Encounter (Signed)
Rx called to [facility] for the Protonix 40 mg #30 and no r/f. Called and informed pt . She is aware that we will take care of the [facility] order a little later so she can get next refill from them.

## 2010-12-12 ENCOUNTER — Other Ambulatory Visit: Payer: Self-pay

## 2010-12-12 MED ORDER — PANTOPRAZOLE SODIUM 40 MG PO TBEC: 40.0000 mg | DELAYED_RELEASE_TABLET | Freq: Every day | ORAL | Status: DC

## 2010-12-12 NOTE — Telephone Encounter (Signed)
Sent the prescription to [facility] for one year supply.

## 2010-12-21 ENCOUNTER — Ambulatory Visit (INDEPENDENT_AMBULATORY_CARE_PROVIDER_SITE_OTHER): Payer: Medicare Other

## 2010-12-21 VITALS — BP 124/67 | HR 67 | Wt 156.0 lb

## 2010-12-21 DIAGNOSIS — I10 Essential (primary) hypertension: Secondary | ICD-10-CM

## 2010-12-21 NOTE — Progress Notes (Signed)
S: Pt. arrives in office for a 1 month BP check. B: On last OV with Dr. Dietrich Pates on 11-21-10 pt. was advised to decrease Chlorthalidone to 12.5 mg and increase Coreg to 12.5 bid due to hyponatremia.  A: Pt. has no complaints at this time. Her BP today is 124/67 and on last OV her BP was 125/70. She did not bring her meds to this visit but she did bring her BP diary (copy placed in Dr. Marvel Plan folder for his review). R: Pt. advised to continue current medical treatment and that we will contact her with Dr. Marvel Plan recommendations, if any.

## 2010-12-24 ENCOUNTER — Other Ambulatory Visit: Payer: Self-pay | Admitting: Cardiology

## 2010-12-25 LAB — LIPID PANEL
Cholesterol: 208 mg/dL — ABNORMAL HIGH (ref 0–200)
Total CHOL/HDL Ratio: 4.3 Ratio
Triglycerides: 122 mg/dL (ref <150)

## 2010-12-25 LAB — BASIC METABOLIC PANEL
Calcium: 9.9 mg/dL (ref 8.4–10.5)
Glucose, Bld: 96 mg/dL (ref 70–99)
Sodium: 140 mEq/L (ref 135–145)

## 2011-01-02 NOTE — Progress Notes (Signed)
Patient ID: Deborah Farrell, female   DOB: 1917/11/13, 77 y.o.   MRN: [id number]  Blood pressure control is excellent.  Hyponatremia has resolved.  Lipid profile is suboptimal, but pharmacologic therapy is not warranted. Continue current therapy with return visit as previously scheduled.

## 2011-01-23 ENCOUNTER — Other Ambulatory Visit: Payer: Self-pay | Admitting: Cardiology

## 2011-01-23 MED ORDER — CARVEDILOL 12.5 MG PO TABS: 12.5000 mg | ORAL_TABLET | Freq: Two times a day (BID) | ORAL | Status: DC

## 2011-01-23 NOTE — Telephone Encounter (Signed)
THE CARVEDILOL WAS ALREADY FILLED TO GO TO [facility] 11/21/10 TO 11/21/11.  PT CAME IN TODAY WITH A LETTER THAT WAS SENT TO HER IN THE MAIL, SHE STATE THAT [facility] HAS NOW CHANGED TO [employer] [address] [place] [address] AND THE PHONE NUMBER IS [phone].  PLEASE FILL NEW PAPER WORK OUT AND SEND IT IN.

## 2011-02-04 ENCOUNTER — Telehealth: Payer: Self-pay | Admitting: Cardiology

## 2011-02-04 NOTE — Telephone Encounter (Signed)
HHN states that patient's BP is running 160/100, 160/98 and 150/98.  States that patient took her meds 7 hrs ago.  HHN states that patient's device was reading lower BP's.  She is checking manually though.  Please call HHN back with instructions. / tg

## 2011-02-05 ENCOUNTER — Telehealth: Payer: Self-pay | Admitting: *Deleted

## 2011-02-05 ENCOUNTER — Encounter (INDEPENDENT_AMBULATORY_CARE_PROVIDER_SITE_OTHER): Payer: Medicare Other

## 2011-02-05 NOTE — Telephone Encounter (Signed)
Patient appeared in the office today to check her pressure and was 135/76 per our monitor.  Her monitor showed a significant difference of 159/68.  Advised patient to take monitor back to [facility] to be calibrated, keep monitoring pressures and call us with trending increased readings.

## 2011-02-05 NOTE — Telephone Encounter (Signed)
Continue to monitor for the next month and provide [place] with a list of multiple readings.

## 2011-02-28 ENCOUNTER — Other Ambulatory Visit: Payer: Self-pay | Admitting: *Deleted

## 2011-02-28 MED ORDER — CARVEDILOL 12.5 MG PO TABS: 12.5000 mg | ORAL_TABLET | Freq: Two times a day (BID) | ORAL | Status: DC

## 2011-05-08 ENCOUNTER — Encounter: Payer: Self-pay | Admitting: Gastroenterology

## 2011-05-08 ENCOUNTER — Ambulatory Visit (INDEPENDENT_AMBULATORY_CARE_PROVIDER_SITE_OTHER): Payer: Medicare Other | Admitting: Gastroenterology

## 2011-05-08 DIAGNOSIS — K589 Irritable bowel syndrome without diarrhea: Secondary | ICD-10-CM

## 2011-05-08 DIAGNOSIS — D369 Benign neoplasm, unspecified site: Secondary | ICD-10-CM

## 2011-05-08 DIAGNOSIS — K219 Gastro-esophageal reflux disease without esophagitis: Secondary | ICD-10-CM

## 2011-05-08 NOTE — Progress Notes (Signed)
Reminder in epic to have TCS done 01/2012 and FU OV in 6 months with SF in E30

## 2011-05-08 NOTE — Progress Notes (Signed)
Faxed to PCP

## 2011-05-08 NOTE — Assessment & Plan Note (Signed)
SX CONTROLLED.  CONTINUE PROTONIX. OPV IN 6 MOS 

## 2011-05-08 NOTE — Progress Notes (Signed)
Subjective:    Patient ID: Deborah Farrell, female    DOB: May 12, 1917, 78 y.o.   MRN: [id number]  PCP: [name], MD  HPI HAVING TROUBLE WITH BLADDER. TAKING VESICARE. LAST NIGHT UP URINATING ALL NIGHT UP & DOWN. PLACED ON MED LAST WEEK. NOT CONSTIPATED ANYMORE. IF SHE HAS PROBLEMS SHE TAKES COLACE. STOMACH DOING PRETTY GOOD. SOMETIMES GET A PINA IN HER LEFT SIDE. HAVE PAIN IN HER BACK AS WELL-EVERY DAY. RX A CREAM. HELPING SOME. GOOD APPETITE. COOKS FOR HERSELF. STILL TAKING [facility]. BMS: USU. 1-2X/DAY.  Past Medical History  Diagnosis Date  . Tubulovillous adenoma 2007    with multifocal high grade dysplasia-WFBH 2007;TV ADENOMA w/o dysplasia 2008;  SIMPLE ADENOMA 2009; 4mm SIMPLE ADENOMA JAN 2011  . IBS (irritable bowel syndrome)     Non-ulcer dyspepsia; hemorrhoids  . HTN (hypertension)   . Glaucoma   . Allergy history, radiographic dye   . Urinary tract infection     Past Surgical History  Procedure Date  . Appendectomy   . Vaginal hysterectomy   . Ovarian cyst removal   . Colonoscopy 2008, 2011    diverticulitis diagnosed, 1mm sessile polyp removed inascending colon. area tattood for future observation and reccomended f/u colonoscopy in one year    Allergies  Allergen Reactions  . Dicyclomine Hcl     REACTION: Unknown reaction  . Hyoscyamine Sulfate     REACTION: Unknown reaction  . Iodinated Diagnostic Agents   . Omeprazole Other (See Comments)    GI upset  . Sulfonamide Derivatives     REACTION: Unknown reaction    Current Outpatient Prescriptions  Medication Sig Dispense Refill  . acetaminophen (TYLENOL) 500 MG tablet Take 500 mg by mouth every 6 (six) hours as needed. As needed       . ALPRAZolam (XANAX) 0.25 MG tablet Take 0.25 mg by mouth at bedtime as needed. One tablet every 6 hours as needed       . amLODipine-valsartan (EXFORGE) 5-320 MG per tablet Take 1 tablet by mouth daily.        . Calcium Carbonate-Vitamin D (CALTRATE 600+D) 600-400  MG-UNIT per chew tablet Chew 1 tablet by mouth daily.        . carvedilol (COREG) 12.5 MG tablet Take 1 tablet (12.5 mg total) by mouth 2 (two) times daily with a meal.    . chlorthalidone (HYGROTON) 25 MG tablet Take 25 mg by mouth daily. Take 1/2 tablet daily     . docusate sodium (COLACE) 100 MG capsule Take 100 mg by mouth 2 (two) times daily.      . fish oil-omega-3 fatty acids 1000 MG capsule Take 1 g by mouth daily.      Marland Kitchen latanoprost (XALATAN) 0.005 % ophthalmic solution Place 1 drop into both eyes at bedtime.      . Multiple Vitamins-Minerals (MULTI COMPLETE PO) Take by mouth.      . pantoprazole (PROTONIX) 40 MG tablet Take 1 tablet (40 mg total) by mouth daily.    . Probiotic Product (PHILL[facility] COLON HEALTH PO) Take by mouth.        . solifenacin (VESICARE) 5 MG tablet Take 5 mg by mouth daily.            Review of Systems     Objective:   Physical Exam  Constitutional: She is oriented to person, place, and time. She appears well-developed. No distress.  HENT:  Head: Normocephalic and atraumatic.  Mouth/Throat: Oropharynx is clear and moist.  No oropharyngeal exudate.  Eyes: Pupils are equal, round, and reactive to light. No scleral icterus.  Neck: Normal range of motion. Neck supple.  Cardiovascular: Normal rate, regular rhythm and normal heart sounds.   Pulmonary/Chest: Effort normal and breath sounds normal. No respiratory distress.  Abdominal: Soft. Bowel sounds are normal. She exhibits no distension. There is no tenderness.  Musculoskeletal: She exhibits no edema.  Neurological: She is alert and oriented to person, place, and time.       NO  NEW FOCAL DEFICITS           Assessment & Plan:

## 2011-05-08 NOTE — Assessment & Plan Note (Signed)
TCS JAN 2014

## 2011-05-08 NOTE — Patient Instructions (Signed)
CONTINUE PROTONIX AND [facility].  FOLLOW UP IN 6 MOS.

## 2011-05-08 NOTE — Assessment & Plan Note (Signed)
Sx CONTROLLED.  CONTINUE Deborah [facility] HE[facility].

## 2011-05-20 ENCOUNTER — Ambulatory Visit (INDEPENDENT_AMBULATORY_CARE_PROVIDER_SITE_OTHER): Payer: Medicare Other | Admitting: Cardiology

## 2011-05-20 ENCOUNTER — Encounter: Payer: Self-pay | Admitting: Cardiology

## 2011-05-20 VITALS — BP 155/77 | HR 61 | Resp 16 | Ht 65.0 in | Wt 162.0 lb

## 2011-05-20 DIAGNOSIS — E785 Hyperlipidemia, unspecified: Secondary | ICD-10-CM

## 2011-05-20 DIAGNOSIS — I10 Essential (primary) hypertension: Secondary | ICD-10-CM

## 2011-05-20 DIAGNOSIS — K219 Gastro-esophageal reflux disease without esophagitis: Secondary | ICD-10-CM

## 2011-05-20 DIAGNOSIS — K589 Irritable bowel syndrome without diarrhea: Secondary | ICD-10-CM

## 2011-05-20 NOTE — Assessment & Plan Note (Addendum)
Patient presents a list of perhaps 100 blood pressure as measured at home over the past 6 months, all of which are excellent.  Hypertension is well controlled.  She describes episodes of weakness associated with standing, which may represent transient hypotension.  We could not document significant orthostatic change in the office.  Behavioral measures for dealing with these symptoms were discussed with her.  At this point, with excellent control of hypertension, routine cardiology care does not appear necessary.  I will be happy to see this nice woman again in the future as needed whenever I can provide assistance with her medical care.  A CBC and metabolic profile are pending.

## 2011-05-20 NOTE — Progress Notes (Signed)
**Note De-Identified  Obfuscation** Name: Deborah Farrell    DOB: 1917/04/08  Age: 78 y.o.  MR#: [id number]       PCP:  Dwana Melena, MD, MD      Insurance: @PAYORNAME @   CC:    Chief Complaint  Patient presents with  . Appointment    pt c/o having back pain + meds    VS BP 150/78  Pulse 59  Resp 16  Ht 5\' 5"  (1.651 m)  Wt 162 lb (73.483 kg)  BMI 26.96 kg/m2  Weights Current Weight  05/20/11 162 lb (73.483 kg)  05/08/11 161 lb 6.4 oz (73.211 kg)  12/21/10 156 lb (70.761 kg)    Blood Pressure  BP Readings from Last 3 Encounters:  05/20/11 150/78  05/08/11 112/61  12/21/10 124/67     Admit date:  (Not on file) Last encounter with RMR:  02/04/2011   Allergy Allergies  Allergen Reactions  . Dicyclomine Hcl     REACTION: Unknown reaction  . Hyoscyamine Sulfate     REACTION: Unknown reaction  . Iodinated Diagnostic Agents   . Omeprazole Other (See Comments)    GI upset  . Sulfonamide Derivatives     REACTION: Unknown reaction    Current Outpatient Prescriptions  Medication Sig Dispense Refill  . acetaminophen (TYLENOL) 500 MG tablet Take 500 mg by mouth every 6 (six) hours as needed. As needed       . ALPRAZolam (XANAX) 0.25 MG tablet Take 0.25 mg by mouth at bedtime as needed. One tablet every 6 hours as needed       . amLODipine-valsartan (EXFORGE) 5-320 MG per tablet Take 1 tablet by mouth daily.        . carvedilol (COREG) 12.5 MG tablet Take 1 tablet (12.5 mg total) by mouth 2 (two) times daily with a meal.  180 tablet  3  . chlorthalidone (HYGROTON) 25 MG tablet Take 25 mg by mouth daily. Take 1/2 tablet daily       . diclofenac (VOLTAREN) 75 MG EC tablet Take 75 mg by mouth 3 (three) times daily.      Marland Kitchen docusate sodium (COLACE) 100 MG capsule Take 100 mg by mouth 2 (two) times daily.        . fish oil-omega-3 fatty acids 1000 MG capsule Take 1 g by mouth daily.        Marland Kitchen latanoprost (XALATAN) 0.005 % ophthalmic solution Place 1 drop into both eyes at bedtime.        . Multiple Vitamins-Minerals  (MULTI COMPLETE PO) Take by mouth.        . pantoprazole (PROTONIX) 40 MG tablet Take 1 tablet (40 mg total) by mouth daily.  94 tablet  3  . Probiotic Product (PHILLIPS COLON HEALTH PO) Take by mouth.        . solifenacin (VESICARE) 5 MG tablet Take 5 mg by mouth daily.          Discontinued Meds:    Medications Discontinued During This Encounter  Medication Reason  . Calcium Carbonate-Vitamin D (CALTRATE 600+D) 600-400 MG-UNIT per chew tablet Error    Patient Active Problem List  Diagnoses  . HYPERLIPIDEMIA  . GLAUCOMA  . HYPERTENSION  . ACID REFLUX DISEASE  . GASTRITIS, CHRONIC  . IRRITABLE BOWEL SYNDROME  . Tubulovillous adenoma    LABS No visits with results within 3 Month(s) from this visit. Latest known visit with results is:  Orders Only on 12/24/2010  Component Date Value  . Sodium 12/24/2010 140   . **Note De-Identified  Obfuscation** Potassium 12/24/2010 4.9   . Chloride 12/24/2010 103   . CO2 12/24/2010 27   . Glucose, Bld 12/24/2010 96   . BUN 12/24/2010 36*  . Creat 12/24/2010 1.19*  . Calcium 12/24/2010 9.9   . Cholesterol 12/24/2010 208*  . Triglycerides 12/24/2010 122   . HDL 12/24/2010 48   . Total CHOL/HDL Ratio 12/24/2010 4.3   . VLDL 12/24/2010 24   . LDL Cholesterol 12/24/2010 136*     Results for this Opt Visit:     Results for orders placed in visit on 12/24/10  BASIC METABOLIC PANEL      Component Value Range   Sodium 140  135 - 145 (mEq/L)   Potassium 4.9  3.5 - 5.3 (mEq/L)   Chloride 103  96 - 112 (mEq/L)   CO2 27  19 - 32 (mEq/L)   Glucose, Bld 96  70 - 99 (mg/dL)   BUN 36 (*) 6 - 23 (mg/dL)   Creat 1.47 (*) 8.29 - 1.10 (mg/dL)   Calcium 9.9  8.4 - 56.2 (mg/dL)  LIPID PANEL      Component Value Range   Cholesterol 208 (*) 0 - 200 (mg/dL)   Triglycerides 130  <865 (mg/dL)   HDL 48  >78 (mg/dL)   Total CHOL/HDL Ratio 4.3     VLDL 24  0 - 40 (mg/dL)   LDL Cholesterol 469 (*) 0 - 99 (mg/dL)    EKG Orders placed in visit on 10/29/10  . EKG 12-LEAD      Prior Assessment and Plan Problem List as of 05/20/2011          Cardiology Problems   HYPERLIPIDEMIA   Last Assessment & Plan Note   11/21/2010 Office Visit Signed 11/21/2010  4:21 PM by Kathlen Brunswick, MD    No recent lipid profile available; one will be obtained.    HYPERTENSION   Last Assessment & Plan Note   11/21/2010 Office Visit Signed 11/21/2010  4:22 PM by Kathlen Brunswick, MD    Blood pressure control is excellent, but patient has developed hyponatremia with serum sodium decreasing to 131 and minimal prerenal azotemia with creatinine increasing from 1.0 to 1.33.  Chlorthalidone dosage will be decreased to 12.5 mg per day and carvedilol dosage increased to 12.5 mg b.i.d.  Patient will monitor blood pressure and heart rate at home and return in one month for a repeat blood pressure check by the cardiology nurses.      Other   GLAUCOMA   ACID REFLUX DISEASE   Last Assessment & Plan Note   05/08/2011 Office Visit Signed 05/08/2011 10:33 AM by West Bali, MD    SX CONTROLLED.  CONTINUE PROTONIX. OPV IN 6 MOS/    GASTRITIS, CHRONIC   IRRITABLE BOWEL SYNDROME   Last Assessment & Plan Note   05/08/2011 Office Visit Signed 05/08/2011 10:33 AM by West Bali, MD    Sx CONTROLLED.  CONTINUE [facility].    Tubulovillous adenoma   Last Assessment & Plan Note   05/08/2011 Office Visit Signed 05/08/2011 10:32 AM by West Bali, MD    TCS JAN 2014        Imaging: No results found.   FRS Calculation: Score not calculated

## 2011-05-20 NOTE — Progress Notes (Signed)
Patient ID: Deborah Farrell, female   DOB: [birth year], 78 y.o.   MRN: [id number]  HPI: Scheduled return visit for this very nice elderly woman with hypertension.  Since her previous visit, she has done superbly.  She is able to do her housework without difficulty.  She denies dyspnea or chest discomfort.  She has not been seen in the emergency department nor required hospitalization.  She occasionally notes weakness in her legs when she stands in the morning, but this passes quickly.  She has not fallen nor lost consciousness.  Prior to Admission medications   Medication Sig Start Date End Date Taking? Authorizing Provider  acetaminophen (TYLENOL) 500 MG tablet Take 500 mg by mouth every 6 (six) hours as needed. As needed    Yes Historical Provider, MD  ALPRAZolam (XANAX) 0.25 MG tablet Take 0.25 mg by mouth at bedtime as needed. One tablet every 6 hours as needed    Yes Historical Provider, MD  amLODipine-valsartan (EXFORGE) 5-320 MG per tablet Take 1 tablet by mouth daily.     Yes Historical Provider, MD  carvedilol (COREG) 12.5 MG tablet Take 1 tablet (12.5 mg total) by mouth 2 (two) times daily with a meal. 02/28/11 02/28/12 Yes Kathlen Brunswick, MD  chlorthalidone (HYGROTON) 25 MG tablet Take 25 mg by mouth daily. Take 1/2 tablet daily    Yes Historical Provider, MD  diclofenac (VOLTAREN) 75 MG EC tablet Take 75 mg by mouth 3 (three) times daily.   Yes Historical Provider, MD  docusate sodium (COLACE) 100 MG capsule Take 100 mg by mouth 2 (two) times daily.     Yes Historical Provider, MD  fish oil-omega-3 fatty acids 1000 MG capsule Take 1 g by mouth daily.     Yes Historical Provider, MD  latanoprost (XALATAN) 0.005 % ophthalmic solution Place 1 drop into both eyes at bedtime.     Yes Historical Provider, MD  Multiple Vitamins-Minerals (MULTI COMPLETE PO) Take by mouth.     Yes Historical Provider, MD  pantoprazole (PROTONIX) 40 MG tablet Take 1 tablet (40 mg total) by mouth daily. 12/12/10  Yes  West Bali, MD  Probiotic Product (PHILLIPS COLON HEALTH PO) Take by mouth.     Yes Historical Provider, MD  solifenacin (VESICARE) 5 MG tablet Take 5 mg by mouth daily.     Yes Historical Provider, MD   Allergies  Allergen Reactions  . Dicyclomine Hcl     REACTION: Unknown reaction  . Hyoscyamine Sulfate     REACTION: Unknown reaction  . Iodinated Diagnostic Agents   . Omeprazole Other (See Comments)    GI upset  . Sulfonamide Derivatives     REACTION: Unknown reaction     Past medical history, social history, and family history reviewed and updated.  ROS: Denies orthopnea, PND, pedal edema, palpitations or syncope.  All other systems reviewed and are negative.  PHYSICAL EXAM: BP 150/78  Pulse 59  Resp 16  Ht 5\' 5"  (1.651 m)  Wt 73.483 kg (162 lb)  BMI 26.96 kg/m2; repeat blood pressure 140/80, Without orthostatic change General-Well developed; no acute distress Body habitus-proportionate weight and height Neck-No JVD; no carotid bruits Lungs-clear lung fields; resonant to percussion; slightly prolonged expiratory phase Cardiovascular-normal PMI; normal S1 and increased S2; minimal systolic murmur at the cardiac base Abdomen-normal bowel sounds; soft and non-tender without masses or organomegaly Musculoskeletal-No deformities, no cyanosis or clubbing Neurologic-Normal cranial nerves; symmetric strength and tone Skin-Warm, no significant lesions Extremities-distal pulses intact; Trace edema  ASSESSMENT AND PLAN:  Keaau Bing, MD 05/20/2011 2:00 PM

## 2011-05-20 NOTE — Patient Instructions (Signed)
**Note De-identified  Obfuscation** Your physician recommends that you continue on your current medications as directed. Please refer to the Current Medication list given to you today.  Your physician recommends that you return for lab work in: today  Your physician recommends that you schedule a follow-up appointment in: as needed  

## 2011-05-20 NOTE — Assessment & Plan Note (Signed)
Mild to moderate hyperlipidemia with no known vascular disease.  At age 78 without substantial cardiovascular risk, pharmacologic therapy is not necessary.

## 2011-05-21 ENCOUNTER — Encounter: Payer: Self-pay | Admitting: *Deleted

## 2011-05-21 LAB — COMPREHENSIVE METABOLIC PANEL
Albumin: 4.7 g/dL (ref 3.5–5.2)
Alkaline Phosphatase: 49 U/L (ref 39–117)
BUN: 26 mg/dL — ABNORMAL HIGH (ref 6–23)
Calcium: 10.5 mg/dL (ref 8.4–10.5)
Creat: 1.04 mg/dL (ref 0.50–1.10)
Glucose, Bld: 85 mg/dL (ref 70–99)
Potassium: 4.3 mEq/L (ref 3.5–5.3)

## 2011-05-21 LAB — CBC WITH DIFFERENTIAL/PLATELET
Hemoglobin: 12.1 g/dL (ref 12.0–15.0)
Lymphs Abs: 2.7 10*3/uL (ref 0.7–4.0)
MCH: 31.1 pg (ref 26.0–34.0)
Monocytes Relative: 8 % (ref 3–12)
Neutro Abs: 5.1 10*3/uL (ref 1.7–7.7)
Neutrophils Relative %: 58 % (ref 43–77)
RBC: 3.89 MIL/uL (ref 3.87–5.11)

## 2011-07-17 ENCOUNTER — Telehealth: Payer: Self-pay | Admitting: Gastroenterology

## 2011-07-17 NOTE — Telephone Encounter (Signed)
Called pt. She said that she had previously been on Omeprazole and it started causing too many BM's and so she was switched to Pantoprazole and it has done good until the last couple of weeks. Said she is again having multiple stools daily ( usually 3 ) which are either loose or very soft. She also has gas and when she feels the gas she will have a BM. She usually has oatmeal and a fruit for breakfast, a meat and one or two vegetables for lunch and then most of the time just a vegetable for super, because she is not very hungry then. Please advise!

## 2011-07-17 NOTE — Telephone Encounter (Signed)
Called pt. She said she is taking the [employer] everyday. She has not been taking the colace for the last week or a little more. She scheduled OV on 07/19/2011 @ 8:00 AM with Lorenza Burton, NP.

## 2011-07-17 NOTE — Telephone Encounter (Signed)
Patient has been on protonix since last year. I am not sure it is the problem. If she is not on a probiotic currently, she could try that. Hold colace if taking. Otherwise I would recommend OV.

## 2011-07-17 NOTE — Telephone Encounter (Signed)
Patient is c/o the protonix is making her bowels move to often, having a lot gas. Please Advise?

## 2011-07-19 ENCOUNTER — Encounter: Payer: Self-pay | Admitting: Urgent Care

## 2011-07-19 ENCOUNTER — Ambulatory Visit (INDEPENDENT_AMBULATORY_CARE_PROVIDER_SITE_OTHER): Payer: Medicare Other | Admitting: Urgent Care

## 2011-07-19 VITALS — BP 101/58 | HR 65 | Temp 97.3°F | Ht 65.0 in | Wt 163.2 lb

## 2011-07-19 DIAGNOSIS — R141 Gas pain: Secondary | ICD-10-CM

## 2011-07-19 DIAGNOSIS — R140 Abdominal distension (gaseous): Secondary | ICD-10-CM

## 2011-07-19 DIAGNOSIS — K219 Gastro-esophageal reflux disease without esophagitis: Secondary | ICD-10-CM

## 2011-07-19 DIAGNOSIS — K589 Irritable bowel syndrome without diarrhea: Secondary | ICD-10-CM

## 2011-07-19 DIAGNOSIS — D369 Benign neoplasm, unspecified site: Secondary | ICD-10-CM

## 2011-07-19 DIAGNOSIS — R1013 Epigastric pain: Secondary | ICD-10-CM | POA: Insufficient documentation

## 2011-07-19 DIAGNOSIS — R142 Eructation: Secondary | ICD-10-CM

## 2011-07-19 DIAGNOSIS — R197 Diarrhea, unspecified: Secondary | ICD-10-CM

## 2011-07-19 MED ORDER — RABEPRAZOLE SODIUM 20 MG PO TBEC: 20.0000 mg | DELAYED_RELEASE_TABLET | Freq: Every day | ORAL | Status: DC

## 2011-07-19 NOTE — Progress Notes (Signed)
Primary Care Physician:  Dwana Melena, MD Primary Gastroenterologist:  Dr. Jonette Eva  Chief Complaint  Patient presents with  . Gas    HPI:  Deborah Farrell is a 78 y.o. female here for gas & bloating.  She has hx GERD & IBS.  She has been on Protonix for quite some time, however she feels that it is giving her gas, bloating & cramps for past 1 month.  She is having soft-loose BMs TID.  Denies rectal bleeding or constipation.  C/o colicky-type lower abdominal pain & bloating.  She developed UTI & was recently treated w/ cipro.  C/o arthritis in her lower back. She takes tylenol arthritis which helps some but not completely.  She stopped taking protonix for a few days & had ingestion, but bloating & colicky abdominal cramps were better so she believes this to be the culprit.   Denies nausea, vomiting, dysphagia, odynophagia or anorexia.  She was previously intolerant of Omeprazole as it caused diarrhea.    Past Medical History  Diagnosis Date  . Tubulovillous adenoma 2007    with multifocal high grade dysplasia-WFBH 2007;TV ADENOMA w/o dysplasia 2008;  SIMPLE ADENOMA 2009; 4mm SIMPLE ADENOMA JAN 2011  . IBS (irritable bowel syndrome)     Non-ulcer dyspepsia; hemorrhoids  . HTN (hypertension)   . Glaucoma   . Allergy history, radiographic dye   . Urinary tract infection   . Arthritis     Past Surgical History  Procedure Date  . Appendectomy   . Vaginal hysterectomy   . Ovarian cyst removal   . Colonoscopy 2008, 2011    diverticulitis diagnosed, 1mm sessile polyp removed inascending colon. area tattood for future observation and reccomended f/u colonoscopy in one year    Current Outpatient Prescriptions  Medication Sig Dispense Refill  . acetaminophen (TYLENOL) 500 MG tablet Take 650 mg by mouth 3 (three) times daily as needed. As needed      . ALPRAZolam (XANAX) 0.25 MG tablet Take 0.25 mg by mouth at bedtime as needed. One tablet every 6 hours as needed       . amLODipine-valsartan  (EXFORGE) 5-320 MG per tablet Take 1 tablet by mouth daily.        . carvedilol (COREG) 12.5 MG tablet Take 1 tablet (12.5 mg total) by mouth 2 (two) times daily with a meal.  180 tablet  3  . chlorthalidone (HYGROTON) 25 MG tablet Take 25 mg by mouth daily. Take 1/2 tablet daily       . ciprofloxacin (CIPRO) 250 MG tablet Take 250 mg by mouth daily.       . fish oil-omega-3 fatty acids 1000 MG capsule Take 1 g by mouth daily.        Marland Kitchen latanoprost (XALATAN) 0.005 % ophthalmic solution Place 1 drop into both eyes at bedtime.        . Multiple Vitamins-Minerals (MULTI COMPLETE PO) Take by mouth.        . pantoprazole (PROTONIX) 40 MG tablet Take 1 tablet (40 mg total) by mouth daily.  94 tablet  3  . Probiotic Product (PHILLIPS COLON HEALTH PO) Take by mouth.        . solifenacin (VESICARE) 5 MG tablet Take 5 mg by mouth daily.        . RABEprazole (ACIPHEX) 20 MG tablet Take 1 tablet (20 mg total) by mouth daily.  90 tablet  0    Allergies as of 07/19/2011 - Review Complete 07/19/2011  Allergen Reaction Noted  .  Dicyclomine hcl    . Hyoscyamine sulfate    . Iodinated diagnostic agents  11/21/2010  . Omeprazole Other (See Comments) 07/25/2010  . Sulfonamide derivatives     Review of Systems: Gen: +gaining weight.  Denies any fever, chills, sweats, anorexia, fatigue, weakness, malaise, and sleep disorder. CV: Denies chest pain, angina, palpitations, syncope, orthopnea, PND, peripheral edema, and claudication. Resp: Denies dyspnea at rest, dyspnea with exercise, cough, sputum, wheezing, coughing up blood, and pleurisy. GI: Denies vomiting blood, jaundice, and fecal incontinence. Derm: Denies rash, itching, dry skin, hives, moles, warts, or unhealing ulcers.  Psych: Denies depression, anxiety, memory loss, suicidal ideation, hallucinations, paranoia, and confusion. Heme: Denies bruising, bleeding, and enlarged lymph nodes.  Physical Exam: BP 101/58  Pulse 65  Temp 97.3 F (36.3 C)  (Temporal)  Ht 5\' 5"  (1.651 m)  Wt 163 lb 3.2 oz (74.027 kg)  BMI 27.16 kg/m2 General:   Alert,  Well-developed, well-nourished, pleasant and cooperative in NAD. Eyes:  Sclera clear, no icterus.   Conjunctiva pink. Mouth:  No deformity or lesions, oropharynx pink and moist. Neck:  Supple; no masses or thyromegaly. Heart:  Regular rate and rhythm; no murmurs, clicks, rubs,  or gallops. Abdomen:  Normal bowel sounds.  No bruits.  Soft, non-tender and non-distended without masses, hepatosplenomegaly or hernias noted.  No guarding or rebound tenderness.   Rectal:  Deferred. Msk:  Symmetrical without gross deformities.  Pulses:  Normal pulses noted. Extremities:  No clubbing or edema. Neurologic:  Alert and oriented x4;  grossly normal neurologically. Skin:  Intact without significant lesions or rashes.

## 2011-07-19 NOTE — Patient Instructions (Addendum)
Stop Protonix Start Aciphex 20mg  daily Continue [facility] probiotic Return stool to lab ASAP (C diff PCR)

## 2011-07-19 NOTE — Assessment & Plan Note (Signed)
Well-controlled on protonix, however pt c/o abd bloating & cramps on this medication.  Trial aciphex.

## 2011-07-19 NOTE — Assessment & Plan Note (Addendum)
Increased abdominal bloating & loose stools.  Recent antibiotics for UTI.  R/o c diff.  Underlying IBS may be contributing.  Pt believes may be due to protonix.  Stop Protonix Start Aciphex 20mg  daily Continue [facility] probiotic Return stool to lab ASAP (C diff PCR)

## 2011-07-22 LAB — CLOSTRIDIUM DIFFICILE BY PCR: Toxigenic C. Difficile by PCR: NOT DETECTED

## 2011-07-23 ENCOUNTER — Telehealth: Payer: Self-pay

## 2011-07-23 NOTE — Progress Notes (Signed)
Quick Note:  Informed pt's niece today when she called about meds. ( See another note) ______

## 2011-07-23 NOTE — Progress Notes (Signed)
Quick Note:  LMOM to call. ______ 

## 2011-07-23 NOTE — Progress Notes (Signed)
Quick Note:  Please call to let pt know she does not have c diff. OV in 3 mo for follow-up GERD/IBS Thanks WU:JWJX,BJYN, MD  ______

## 2011-07-23 NOTE — Progress Notes (Signed)
Quick Note:  Routing to [place] for next appt. ______

## 2011-07-23 NOTE — Telephone Encounter (Signed)
VM from pt's niece, Harriett Sine, said pt cannot take Aciphex it wreaks her stomach. She said please call [facility] and retrack to keep that Rx from being charged to pt's card and she can't take the medicine.  Called and spoke to [place]. She said they called [employer] and pt will not be billed for it, insurance does not cover and she needs another Rx sent in. Please advise!

## 2011-07-24 NOTE — Telephone Encounter (Signed)
CALL IN NEXIUM 40 MG EVERY AM #31 RF X 11. PT MAY PICK UP #15 SAMPLES TO TRY FOR 2 WEEKS PRIOR TO FILLING Rx.

## 2011-07-24 NOTE — Telephone Encounter (Signed)
See phone note of 07/17/2011. Dr. Darrick Penna gave Nexium.

## 2011-07-24 NOTE — Telephone Encounter (Signed)
Waiting to see how pt tolerates Nexium before Rx sent in . ( Pt worries about meds being charged to her card and she cannot tolerate them).

## 2011-07-24 NOTE — Telephone Encounter (Signed)
Pt needs a different prescription. Please advise!

## 2011-07-24 NOTE — Telephone Encounter (Signed)
Called and informed pt, samples are ready for pick up.

## 2011-07-24 NOTE — Telephone Encounter (Signed)
REVIEWED.  

## 2011-07-30 NOTE — Telephone Encounter (Signed)
LMOM for pt to call and let us know how the Nexium is doing, so we can send for mail order.

## 2011-07-31 NOTE — Telephone Encounter (Signed)
Pt tried samples of Nexium. She said it is working well now. OK to send in the prescription that Dr. Darrick Penna did for Nexium to her mail order. She had wanted to hold off, until she knew it was working, so it would not get billed to her credit card. Routing to Rx refill to get sent in.

## 2011-08-05 ENCOUNTER — Telehealth: Payer: Self-pay

## 2011-08-05 NOTE — Telephone Encounter (Signed)
Pt came by office- she was concerned that rx for nexium was sent in. She said she wants to try nexium a little longer to make sure she can take it. Looked thru pts chart- cannot find anywhere that rx was sent in to mail order. Gave pt #20 samples of nexium to try longer. She will call if she has any problems or if she wants rx sent in.

## 2011-08-05 NOTE — Telephone Encounter (Signed)
Noted  

## 2011-08-20 ENCOUNTER — Telehealth: Payer: Self-pay

## 2011-08-20 NOTE — Telephone Encounter (Signed)
Pt came by office. She feels that the Nexium is causing gas. She stopped taking it on Sun. She now gets mild indigestion. She said she has about 3 BM's daily. One first thing in the morning and one after lunch and one before she goes to bed. They are mostly soft. She said when she was taking the Nexium, she took in the morning, and she did not have the gas until about 2 pm daily. She always eats lunch around 12:00 noon. She usually has chicken or beef and one vegetable. Then around 2:00 pm she would start to having abdominal cramps and some abdominal pain. ( She had that yesterday even though she did not take the Nexium). She said Omeprazole in the past has given her diarrhea. She can't remember why she could not take the Aciphex. Please advise!

## 2011-08-21 NOTE — Telephone Encounter (Signed)
REVIEWED CHAT. PT FAILED PRILOSEC, PROTONIX, & NOW NEXIUM DUE TO GI UPSET. PT MAY TRY PREVACID 30 MG DAILY, #[id number], [id number].

## 2011-08-21 NOTE — Telephone Encounter (Signed)
Pt came by the office at noon. I told her Dr. Darrick Penna has been busy with patients. We will call her this afternoon.

## 2011-08-21 NOTE — Telephone Encounter (Signed)
Called and informed pt. She wanted it called to [facility] in [place], because the mail order will send too much at one time,and she wants to try it first.  Called to [place] at [facility].

## 2011-09-03 ENCOUNTER — Telehealth: Payer: Self-pay | Admitting: Cardiology

## 2011-09-03 NOTE — Telephone Encounter (Signed)
Pt would like for [place] to call her in a 7 day supply of carvedilol. She still has not got RX in mail. Please call to verify to [facility] (cant remember what pharmacy she said)

## 2011-09-04 ENCOUNTER — Other Ambulatory Visit: Payer: Self-pay | Admitting: *Deleted

## 2011-09-04 MED ORDER — CARVEDILOL 12.5 MG PO TABS: 12.5000 mg | ORAL_TABLET | Freq: Two times a day (BID) | ORAL | Status: DC

## 2011-09-04 NOTE — Telephone Encounter (Signed)
Called to [facility]

## 2011-09-18 ENCOUNTER — Ambulatory Visit (INDEPENDENT_AMBULATORY_CARE_PROVIDER_SITE_OTHER): Payer: Medicare Other | Admitting: Adult Health

## 2011-09-18 ENCOUNTER — Encounter: Payer: Self-pay | Admitting: Adult Health

## 2011-09-18 VITALS — BP 120/70 | HR 58 | Ht 66.0 in | Wt 159.8 lb

## 2011-09-18 DIAGNOSIS — I10 Essential (primary) hypertension: Secondary | ICD-10-CM

## 2011-09-18 NOTE — Progress Notes (Signed)
HPI:Deborah Farrell is a pleasant 78 year old female patient of Dr. Ben [place] Bing we are following for ongoing assessment of treatment of hypertension. With the history that includes lipidemia, native nephrectomy, and IBS. She has not been seen in the ER had any new diagnoses since being seen 6 months ago. She is medically compliant. Her only complaint is some lower back pain and GERD symptoms for which she is being treated by gastroenterology.  Allergies  Allergen Reactions  . Aciphex (Rabeprazole) Other (See Comments)    intolerance  . Dicyclomine Hcl     REACTION: Unknown reaction  . Hyoscyamine Sulfate     REACTION: Unknown reaction  . Iodinated Diagnostic Agents   . Nexium (Esomeprazole Magnesium)     GI UPSET  . Omeprazole Other (See Comments)    GI upset  . Sulfonamide Derivatives     REACTION: Unknown reaction    Current Outpatient Prescriptions  Medication Sig Dispense Refill  . acetaminophen (TYLENOL) 500 MG tablet Take 650 mg by mouth 3 (three) times daily as needed. As needed      . ALPRAZolam (XANAX) 0.25 MG tablet Take 0.25 mg by mouth at bedtime as needed. One tablet every 6 hours as needed       . amLODipine-valsartan (EXFORGE) 5-320 MG per tablet Take 1 tablet by mouth daily.        . carvedilol (COREG) 12.5 MG tablet Take 1 tablet (12.5 mg total) by mouth 2 (two) times daily with a meal.  14 tablet  0  . chlorthalidone (HYGROTON) 25 MG tablet Take 25 mg by mouth daily. Take 1/2 tablet daily       . fish oil-omega-3 fatty acids 1000 MG capsule Take 1 g by mouth daily.        . lansoprazole (PREVACID) 30 MG capsule Take 30 mg by mouth daily.       Marland Kitchen latanoprost (XALATAN) 0.005 % ophthalmic solution Place 1 drop into both eyes at bedtime.        . Multiple Vitamins-Minerals (MULTI COMPLETE PO) Take by mouth.        . Probiotic Product (PHILLIPS COLON HEALTH PO) Take by mouth.        . solifenacin (VESICARE) 5 MG tablet Take 5 mg by mouth daily.          Past  Medical History  Diagnosis Date  . Tubulovillous adenoma 2007    with multifocal high grade dysplasia-WFBH 2007;TV ADENOMA w/o dysplasia 2008;  SIMPLE ADENOMA 2009; 4mm SIMPLE ADENOMA JAN 2011  . IBS (irritable bowel syndrome)     Non-ulcer dyspepsia; hemorrhoids  . HTN (hypertension)   . Glaucoma   . Allergy history, radiographic dye   . Urinary tract infection   . Arthritis     Past Surgical History  Procedure Date  . Appendectomy   . Vaginal hysterectomy   . Ovarian cyst removal   . Colonoscopy 2008, 2011    diverticulitis diagnosed, 1mm sessile polyp removed inascending colon. area tattood for future observation and reccomended f/u colonoscopy in one year    GNF:AOZHYQ of systems complete and found to be negative unless listed above  PHYSICAL EXAM BP 120/70  Pulse 58  Ht 5\' 6"  (1.676 m)  Wt 159 lb 12 oz (72.462 kg)  BMI 25.78 kg/m2  General: Well developed, well nourished, in no acute distress Head: Eyes PERRLA, No xanthomas.   Normal cephalic and atramatic  Lungs: Clear bilaterally to auscultation and percussion. Heart: HRRR S1 S2, without MRG.  Pulses are 2+ & equal.            No carotid bruit. No JVD.  No abdominal bruits. No femoral bruits. Abdomen: Bowel sounds are positive, hyperactive,  abdomen soft and non-tender without masses or                  Hernia's noted. Msk:  Back normal, normal gait. Normal strength and tone for age. Extremities: No clubbing, cyanosis or edema.  DP +1 Neuro: Alert and oriented X 3. Psych:  Good affect, responds appropriately    ASSESSMENT AND PLAN

## 2011-09-18 NOTE — Assessment & Plan Note (Signed)
Blood pressure has excellent control on current medication regimen. She has some complaints of some mild lower extremity edema at the end of the day that has dissipated by the following morning. I explained to her that this is probably medication induced with the use of amlodipine. If her edema becomes significant, she can take a total of 25 mg of chlorthalidone on a when necessary basis only and continue with her usual dose of 12.5 milligrams daily as directed.

## 2011-09-18 NOTE — Patient Instructions (Addendum)
Your physician wants you to follow-up in: 6 months with Kathryn Lawrence, NP. You will receive a reminder letter in the mail two months in advance. If you don't receive a letter, please call our office to schedule the follow-up appointment.  

## 2011-10-09 ENCOUNTER — Encounter: Payer: Self-pay | Admitting: Cardiology

## 2011-10-10 ENCOUNTER — Encounter: Payer: Self-pay | Admitting: Gastroenterology

## 2011-10-23 ENCOUNTER — Telehealth: Payer: Self-pay

## 2011-10-23 ENCOUNTER — Other Ambulatory Visit: Payer: Self-pay | Admitting: Adult Health

## 2011-10-23 DIAGNOSIS — R140 Abdominal distension (gaseous): Secondary | ICD-10-CM

## 2011-10-23 MED ORDER — PANTOPRAZOLE SODIUM 40 MG PO TBEC: DELAYED_RELEASE_TABLET | ORAL | Status: DC

## 2011-10-23 MED ORDER — CHLORTHALIDONE 25 MG PO TABS: 12.5000 mg | ORAL_TABLET | Freq: Every day | ORAL | Status: DC

## 2011-10-23 NOTE — Telephone Encounter (Signed)
Pt's niece, Harriett Sine called and said that pt needs the Pantoprazole sent in. [place] they have tried the others, but she was given a prescription for this awhile ago and it helped more than anything else. She said the bottle that was filled had Dr. Darrick Penna name on it, and it had been awhile. Please send to [facility] ( listed in her pharmacies). Her prescriptions should always go to the [facility] unless something she needs right away, then it would go to [facility] in [place].

## 2011-10-23 NOTE — Telephone Encounter (Signed)
Pt's niece left a VM that she needs refills on her Lansoprazole sent to [facility]. Phone number is [phone] LM with pt for niece to call and verify the mail order service.

## 2011-10-23 NOTE — Telephone Encounter (Signed)
PLEASE CALL PT.  Rx sent-90 DAY SUPPLY, REFILL FOR 1 YEAR.

## 2011-10-23 NOTE — Telephone Encounter (Signed)
Please call in to [phone]  "[facility]"

## 2011-10-23 NOTE — Telephone Encounter (Signed)
Called and informed pt.  

## 2011-11-02 ENCOUNTER — Encounter (HOSPITAL_COMMUNITY): Payer: Self-pay | Admitting: Emergency Medicine

## 2011-11-02 ENCOUNTER — Emergency Department (HOSPITAL_COMMUNITY)
Admission: EM | Admit: 2011-11-02 | Discharge: 2011-11-03 | Disposition: A | Discharge status: Home / Self Care | Payer: Medicare Other | Attending: Emergency Medicine | Admitting: Emergency Medicine

## 2011-11-02 DIAGNOSIS — I10 Essential (primary) hypertension: Secondary | ICD-10-CM | POA: Insufficient documentation

## 2011-11-02 DIAGNOSIS — R300 Dysuria: Secondary | ICD-10-CM | POA: Insufficient documentation

## 2011-11-02 DIAGNOSIS — N390 Urinary tract infection, site not specified: Secondary | ICD-10-CM | POA: Insufficient documentation

## 2011-11-02 LAB — URINALYSIS, ROUTINE W REFLEX MICROSCOPIC
Bilirubin Urine: NEGATIVE
Nitrite: POSITIVE — AB
Protein, ur: NEGATIVE mg/dL
Urobilinogen, UA: 1 mg/dL (ref 0.0–1.0)

## 2011-11-02 LAB — URINE MICROSCOPIC-ADD ON

## 2011-11-02 MED ORDER — SODIUM CHLORIDE 0.9 % IV SOLN: INTRAVENOUS | Status: DC

## 2011-11-02 NOTE — ED Notes (Signed)
Patient complaining of dysuria and frequent urination that started this morning.

## 2011-11-03 LAB — CBC WITH DIFFERENTIAL/PLATELET
Basophils Absolute: 0 10*3/uL (ref 0.0–0.1)
Basophils Relative: 1 % (ref 0–1)
Eosinophils Absolute: 0.3 10*3/uL (ref 0.0–0.7)
Eosinophils Relative: 4 % (ref 0–5)
MCH: 31.9 pg (ref 26.0–34.0)
MCHC: 33.3 g/dL (ref 30.0–36.0)
MCV: 95.8 fL (ref 78.0–100.0)
Neutrophils Relative %: 57 % (ref 43–77)
Platelets: 216 10*3/uL (ref 150–400)
RDW: 13.6 % (ref 11.5–15.5)

## 2011-11-03 LAB — BASIC METABOLIC PANEL
Calcium: 9.8 mg/dL (ref 8.4–10.5)
GFR calc Af Amer: 34 mL/min — ABNORMAL LOW (ref >90)
GFR calc non Af Amer: 29 mL/min — ABNORMAL LOW (ref >90)
Glucose, Bld: 113 mg/dL — ABNORMAL HIGH (ref 70–99)
Potassium: 3.9 mEq/L (ref 3.5–5.1)
Sodium: 135 mEq/L (ref 135–145)

## 2011-11-03 MED ORDER — CIPROFLOXACIN HCL 500 MG PO TABS: 250.0000 mg | ORAL_TABLET | Freq: Two times a day (BID) | ORAL | Status: DC

## 2011-11-03 MED ORDER — CIPROFLOXACIN HCL 250 MG PO TABS
500.0000 mg | ORAL_TABLET | Freq: Once | ORAL | Status: AC
  Administered 2011-11-03: 500 mg via ORAL
  Filled 2011-11-03: qty 2

## 2011-11-03 NOTE — ED Provider Notes (Signed)
Medical screening examination/treatment/procedure(s) were performed by non-physician practitioner and as supervising physician I was immediately available for consultation/collaboration.  Nicoletta Dress. Colon Branch, MD 11/03/11 [phone]

## 2011-11-03 NOTE — ED Provider Notes (Signed)
History     CSN: [id number]  Arrival date & time 11/02/11  2132   First MD Initiated Contact with Patient 11/02/11 2149      Chief Complaint  Patient presents with  . Urinary Frequency  . Dysuria    (Consider location/radiation/quality/duration/timing/severity/associated sxs/prior treatment) HPI Comments: Began having discomfort with urination this AM.  + frequency and urgency.  No hematuria.  No fever or chills.  No back pain.    Pt of dr. Timothy Lasso hall.  Patient is a 78 y.o. female presenting with frequency and dysuria. The history is provided by the patient. No language interpreter was used.  Urinary Frequency This is a new problem. The current episode started today. The problem has been unchanged. Associated symptoms include urinary symptoms. Pertinent negatives include no abdominal pain, chills, diaphoresis, fatigue, fever, nausea, vomiting or weakness. Nothing aggravates the symptoms. Treatments tried: AZO. The treatment provided moderate relief.  Dysuria  Associated symptoms include frequency and urgency. Pertinent negatives include no chills, no nausea, no vomiting and no hematuria.    Past Medical History  Diagnosis Date  . 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 IBS (irritable bowel syndrome)     Non-ulcer dyspepsia; hemorrhoids  . HTN (hypertension)   . Glaucoma(365)   . Allergy history, radiographic dye   . Urinary tract infection   . Arthritis     Past Surgical History  Procedure Date  . Appendectomy   . Vaginal hysterectomy   . Ovarian cyst removal   . Colonoscopy 2008, 2011    diverticulitis diagnosed, 1mm sessile polyp removed inascending colon. area tattood for future observation and reccomended f/u colonoscopy in one year    Family History  Problem Relation Age of Onset  . Stroke Mother   . Heart attack Sister   . Heart  disease Sister     History  Substance Use Topics  . Smoking status: Never Smoker   . Smokeless tobacco: Never Used  . Alcohol Use: No    OB History    Grav Para Term Preterm Abortions TAB SAB Ect Mult Living                  Review of Systems  Constitutional: Negative for fever, chills, diaphoresis and fatigue.  Gastrointestinal: Negative for nausea, vomiting and abdominal pain.  Genitourinary: Positive for dysuria, urgency and frequency. Negative for hematuria.  Neurological: Negative for weakness.  All other systems reviewed and are negative.    Allergies  Aciphex; Dicyclomine hcl; Hyoscyamine sulfate; Iodinated diagnostic agents; Nexium; Omeprazole; and Sulfonamide derivatives  Home Medications   Current Outpatient Rx  Name Route Sig Dispense Refill  . ACETAMINOPHEN 500 MG PO TABS Oral Take 650 mg by mouth 3 (three) times daily as needed. As needed    . ALPRAZOLAM 0.25 MG PO TABS Oral Take 0.25 mg by mouth at bedtime as needed. One tablet every 6 hours as needed     . AMLODIPINE BESYLATE-VALSARTAN 5-320 MG PO TABS Oral Take 1 tablet by mouth daily.      Marland Kitchen CARVEDILOL 12.5 MG PO TABS Oral Take 1 tablet (12.5 mg total) by mouth 2 (two) times daily with a meal. 14 tablet 0    Patient is awaiting mail order supply  . CHLORTHALIDONE 25 MG PO TABS Oral Take 0.5 tablets (12.5 mg total) by mouth daily. Take 1/2 tablet daily 15 tablet 11  .  CIPROFLOXACIN HCL 500 MG PO TABS Oral Take 0.5 tablets (250 mg total) by mouth every 12 (twelve) hours. 10 tablet 0  . OMEGA-3 FATTY ACIDS 1000 MG PO CAPS Oral Take 1 g by mouth daily.      Marland Kitchen LATANOPROST 0.005 % OP SOLN Both Eyes Place 1 drop into both eyes at bedtime.      Malachy Mood COMPLETE PO Oral Take by mouth.      Marland Kitchen PANTOPRAZOLE SODIUM 40 MG PO TBEC  1 PO 30 MINUTES PRIOR TO MEALS BID FOR 3 MOS THEN QD 186 tablet 3  . PHILLIPS COLON HEALTH PO Oral Take by mouth.      . SOLIFENACIN SUCCINATE 5 MG PO TABS Oral Take 5 mg by mouth daily.          BP 141/68  Pulse 69  Temp 98.4 F (36.9 C) (Oral)  Resp 14  Ht 5\' 6"  (1.676 m)  Wt 159 lb (72.122 kg)  BMI 25.66 kg/m2  SpO2 97%  Physical Exam  Nursing note and vitals reviewed. Constitutional: She is oriented to person, place, and time. She appears well-developed and well-nourished. No distress.  HENT:  Head: Normocephalic and atraumatic.  Eyes: EOM are normal.  Neck: Normal range of motion.  Cardiovascular: Normal rate and regular rhythm.   Pulmonary/Chest: Effort normal.  Abdominal: Soft. Normal appearance. She exhibits no distension. There is no tenderness. There is no rebound, no guarding and no CVA tenderness.       Denies any pain and has no PT in suprapubic area.  Musculoskeletal: Normal range of motion.  Neurological: She is alert and oriented to person, place, and time. She has normal strength. No sensory deficit. Coordination and gait normal. GCS eye subscore is 4. GCS verbal subscore is 5. GCS motor subscore is 6.  Skin: Skin is warm and dry.  Psychiatric: She has a normal mood and affect. Judgment normal.    ED Course  Procedures (including critical care time)  Labs Reviewed  URINALYSIS, ROUTINE W REFLEX MICROSCOPIC - Abnormal; Notable for the following:    Color, Urine ORANGE (*)  BIOCHEMICALS MAY BE AFFECTED BY COLOR   APPearance HAZY (*)     Specific Gravity, Urine <1.005 (*)     Hgb urine dipstick TRACE (*)     Nitrite POSITIVE (*)     Leukocytes, UA LARGE (*)     All other components within normal limits  CBC WITH DIFFERENTIAL - Abnormal; Notable for the following:    RBC 3.32 (*)     Hemoglobin 10.6 (*)     HCT 31.8 (*)     All other components within normal limits  BASIC METABOLIC PANEL - Abnormal; Notable for the following:    Glucose, Bld 113 (*)     BUN 36 (*)     Creatinine, Ser 1.49 (*)     GFR calc non Af Amer 29 (*)     GFR calc Af Amer 34 (*)     All other components within normal limits  URINE MICROSCOPIC-ADD ON - Abnormal;  Notable for the following:    Squamous Epithelial / LPF FEW (*)     Bacteria, UA MANY (*)     All other components within normal limits  URINE CULTURE   No results found.   1. UTI (urinary tract infection)    D/w dr. Colon Branch who wil go see pt also.   MDM  rx-cipro 250 mg BID, 10 Drink plenty of fluids.  F/u with dr. Lanny Cramp, PA 11/03/11 [phone]

## 2011-11-05 LAB — URINE CULTURE

## 2011-11-06 NOTE — ED Notes (Signed)
+   Urine Patient treated with Cipro-sensitive to same-chart appended per protocol MD. 

## 2011-11-18 ENCOUNTER — Telehealth: Payer: Self-pay | Admitting: Cardiology

## 2011-11-18 MED ORDER — CARVEDILOL 12.5 MG PO TABS: 12.5000 mg | ORAL_TABLET | Freq: Two times a day (BID) | ORAL | Status: DC

## 2011-11-18 NOTE — Telephone Encounter (Signed)
Done

## 2011-11-18 NOTE — Telephone Encounter (Signed)
PLEASE CALL IN CARVEDILOL FOR 90 DAYS IN STEAD OF 1 MONTH. THEY ARE HAVING A HARD TIME KEEPING UP WITH IT.

## 2011-11-20 ENCOUNTER — Ambulatory Visit (HOSPITAL_COMMUNITY)
Admission: RE | Admit: 2011-11-20 | Discharge: 2011-11-20 | Disposition: A | Discharge status: Home / Self Care | Payer: Medicare Other | Source: Ambulatory Visit | Attending: Gastroenterology | Admitting: Gastroenterology

## 2011-11-20 ENCOUNTER — Encounter: Payer: Self-pay | Admitting: Gastroenterology

## 2011-11-20 ENCOUNTER — Other Ambulatory Visit: Payer: Self-pay | Admitting: Gastroenterology

## 2011-11-20 ENCOUNTER — Ambulatory Visit (INDEPENDENT_AMBULATORY_CARE_PROVIDER_SITE_OTHER): Payer: Medicare Other | Admitting: Gastroenterology

## 2011-11-20 VITALS — BP 127/71 | HR 62 | Temp 97.2°F | Ht 62.0 in | Wt 161.0 lb

## 2011-11-20 DIAGNOSIS — M25569 Pain in unspecified knee: Secondary | ICD-10-CM | POA: Insufficient documentation

## 2011-11-20 DIAGNOSIS — M545 Low back pain, unspecified: Secondary | ICD-10-CM

## 2011-11-20 DIAGNOSIS — M25551 Pain in right hip: Secondary | ICD-10-CM

## 2011-11-20 DIAGNOSIS — M533 Sacrococcygeal disorders, not elsewhere classified: Secondary | ICD-10-CM

## 2011-11-20 DIAGNOSIS — D369 Benign neoplasm, unspecified site: Secondary | ICD-10-CM

## 2011-11-20 DIAGNOSIS — M25559 Pain in unspecified hip: Secondary | ICD-10-CM

## 2011-11-20 DIAGNOSIS — R197 Diarrhea, unspecified: Secondary | ICD-10-CM

## 2011-11-20 DIAGNOSIS — K219 Gastro-esophageal reflux disease without esophagitis: Secondary | ICD-10-CM

## 2011-11-20 DIAGNOSIS — M4120 Other idiopathic scoliosis, site unspecified: Secondary | ICD-10-CM | POA: Insufficient documentation

## 2011-11-20 NOTE — Progress Notes (Signed)
Subjective:    Patient ID: Deborah Farrell, female    DOB: 19-Aug-1917, 78 y.o.   MRN: [id number]  PCP: HALL  HPI HAVING HEARTBURN. BETTER WITH PROTONIX. PROTONIX MAY CAUSE HER TO HAVE COLICKY PAIN AND LOOSE STOOL EVERY 3-4 DAYS. AFTER SHE GOES HER SX ARE RELIEVED. IT WORKS BETTER THAN OTHER NEXIUM OR OMEPRAZOLE. BmS: EVERY DAY. NO NAUSEA OR VOMITING. NO PROBLEMS SWALLOWING. PT DENIES FEVER, CHILLS, BRBPR, nausea, vomiting, melena, diarrhea, constipation, abd pain, problems swallowing, problems with sedation, heartburn or indigestion. HEME NEG THIS SUMMER. HAVING BLADDER: CYSTITIS-LAST ABX 3 WEEKS AGO. TAKING PHILLIP'S COLON HEALTH. SEES DR. HALL TOMORROW.   ALSO HAVING R BACK/HIP PAIN RADIATING TO R ANT HIP CREASE. SX FOR A YEAR OR TWO. ACHY. MOVES DOWN FRONT OF LEG. WALKS WITH A  CANE FOR BALANCE. PRACTICALLY EVERY DAY. LASTS OFF AND ON FOR DAYS. DOESN'T KEEP HER AWAKE AT NIGHT. NO XRAYS. HAS A CREAM BUT IT DOESN'T REALLY WORK.  MAY BOTHER HER STOMACH.  Past Medical History  Diagnosis Date  . 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 IBS (irritable bowel syndrome)     Non-ulcer dyspepsia; hemorrhoids  . HTN (hypertension)   . Glaucoma(365)   . Allergy history, radiographic dye   . Urinary tract infection   . Arthritis     Past Surgical History  Procedure Date  . Appendectomy   . Vaginal hysterectomy   . Ovarian cyst removal   . Colonoscopy 2008, 2011    diverticulitis diagnosed, 1mm sessile polyp removed inascending colon. area tattood for future observation and reccomended f/u colonoscopy in one year    Allergies  Allergen Reactions  . Aciphex (Rabeprazole) Other (See Comments)    intolerance  . Dicyclomine Hcl     REACTION: Unknown reaction  . Hyoscyamine Sulfate     REACTION: Unknown reaction  . Iodinated Diagnostic Agents   . Nexium  (Esomeprazole Magnesium)     GI UPSET  . Omeprazole Other (See Comments)    GI upset  . Sulfonamide Derivatives     REACTION: Unknown reaction    Current Outpatient Prescriptions  Medication Sig Dispense Refill  . acetaminophen (TYLENOL) 500 MG tablet Take 650 mg by mouth 3 (three) times daily as needed. As needed      . ALPRAZolam (XANAX) 0.25 MG tablet Take 0.25 mg by mouth at bedtime as needed. One tablet every 6 hours as needed       . amLODipine-valsartan (EXFORGE) 5-320 MG per tablet Take 1 tablet by mouth daily.      . carvedilol (COREG) 12.5 MG tablet Take 1 tablet (12.5 mg total) by mouth 2 (two) times daily with a meal.    . chlorthalidone (HYGROTON) 25 MG tablet Take 0.5 tablets (12.5 mg total) by mouth daily. Take 1/2 tablet daily    . fish oil-omega-3 fatty acids 1000 MG capsule Take 1 g by mouth daily.      Marland Kitchen latanoprost (XALATAN) 0.005 % ophthalmic solution Place 1 drop into both eyes at bedtime.      . Multiple Vitamins-Minerals (MULTI COMPLETE PO) Take by mouth.      . pantoprazole (PROTONIX) 40 MG tablet 1 PO 30 MINUTES PRIOR TO MEALS  QD    . Probiotic Product (PHILLIPS COLON HEALTH PO) Take by mouth.      . solifenacin (VESICARE) 5 MG tablet Take 5 mg by  mouth daily.      .           Review of Systems     Objective:   Physical Exam  Vitals reviewed. Constitutional: She is oriented to person, place, and time. She appears well-nourished. No distress.  HENT:  Head: Normocephalic and atraumatic.  Mouth/Throat: Oropharynx is clear and moist. No oropharyngeal exudate.  Eyes: Pupils are equal, round, and reactive to light. No scleral icterus.  Neck: Normal range of motion. Neck supple.  Cardiovascular: Normal rate, regular rhythm and normal heart sounds.   Pulmonary/Chest: Effort normal and breath sounds normal. No respiratory distress.  Abdominal: Soft. Bowel sounds are normal. She exhibits no distension.  Musculoskeletal: She exhibits no edema.  Neurological:  She is alert and oriented to person, place, and time.       NO  NEW FOCAL DEFICITS   Psychiatric: She has a normal mood and affect.          Assessment & Plan:

## 2011-11-20 NOTE — Progress Notes (Signed)
Faxed to PCP

## 2011-11-20 NOTE — Assessment & Plan Note (Signed)
XRAYS AND REFER TO DR. HARRISON.

## 2011-11-20 NOTE — Assessment & Plan Note (Signed)
LAST TCS JAN 2011. DUE IN 2014.  OPV IN 6 MOS AND SCHEDULE NEXT TCS.

## 2011-11-20 NOTE — Patient Instructions (Signed)
COMPLETE XRAYS.  SEE DR. HARRISON FOR RIGHT HIP PAIN.  CONTINUE PROTONIX.  FOLLOW UP IN 6 MOS.  WILL SCHEDULE YOUR COLONOSCOPY AFTER YOUR NEXT VISIT.

## 2011-11-20 NOTE — Progress Notes (Signed)
Called and informed pt. Routing to Deborah Farrell in reference to the referral to Dr.Harrison.

## 2011-11-20 NOTE — Progress Notes (Signed)
PLEASE CALL PT.   HER SPINE FILMS SHOW DEGENERATIVE DISC DISEASE AND SCOLISOSIS. HER HIP FILMS SHOW MILD DEGENERATIVE CHANGES. SHE SHOULD DISCUSS TREATMENT OPTIONS WITH DR. HARRISON.

## 2011-11-20 NOTE — Assessment & Plan Note (Signed)
DIFFERENTIAL DIAGNOSIS: DDD, AVASCULAR NECROSIS, SI JOINT INFLAMMATION.  XRAYS AND REFER TO DR. HARRISON.

## 2011-11-20 NOTE — Assessment & Plan Note (Signed)
SX CONTROLLED  WITH PROTONIX.  CONTINUE PROTONIX. OPV IN 6 MOS

## 2011-11-20 NOTE — Assessment & Plan Note (Signed)
WITH PROTONIX.

## 2011-11-21 ENCOUNTER — Telehealth: Payer: Self-pay | Admitting: Gastroenterology

## 2011-11-21 NOTE — Telephone Encounter (Signed)
REVIEWED.  

## 2011-11-21 NOTE — Telephone Encounter (Signed)
Patient is scheduled to see Dr. Romeo Apple on 12/05/11 at 10:30 am and she is aware

## 2011-12-02 ENCOUNTER — Other Ambulatory Visit: Payer: Self-pay | Admitting: *Deleted

## 2011-12-02 ENCOUNTER — Telehealth: Payer: Self-pay | Admitting: Cardiology

## 2011-12-02 NOTE — Telephone Encounter (Signed)
Patient states that she has found her medicines. / tg

## 2011-12-05 ENCOUNTER — Ambulatory Visit (INDEPENDENT_AMBULATORY_CARE_PROVIDER_SITE_OTHER): Payer: Medicare Other | Admitting: Orthopedic Surgery

## 2011-12-05 ENCOUNTER — Encounter: Payer: Self-pay | Admitting: Orthopedic Surgery

## 2011-12-05 VITALS — Ht 62.0 in | Wt 159.0 lb

## 2011-12-05 DIAGNOSIS — M533 Sacrococcygeal disorders, not elsewhere classified: Secondary | ICD-10-CM

## 2011-12-05 DIAGNOSIS — M545 Low back pain, unspecified: Secondary | ICD-10-CM

## 2011-12-05 NOTE — Progress Notes (Signed)
Patient ID: Deborah Farrell, female   DOB: 08-03-1917, 78 y.o.   MRN: [id number] Chief Complaint  Patient presents with  . Back Pain    Back pain, no injury.    The patient was referred to [place] by Dr. Raj Janus.  Primary care physician Dr. Suzan Slick.[name]  The patient complains of chronic back pain with occasional radiation of pain down the RIGHT leg.  She denies numbness or tingling in the RIGHT lower extremity. Denies any weakness in the RIGHT lower extremity.  She is an extremely young looking 78 year old female, who walks without any assistive devices. Overall, her parents was normal. She was oriented x3. Mood and affect were normal. There is no palpable tenderness in the lower back. She had a negative RIGHT. Straight leg raise. Her hip range of motion showed slight decrease in internal rotation, but no pain full range of motion. Hip joint was stable. Muscle tone was normal.  Skin was intact. Normal distal sensation and pulses.  Hip x-rays: And lumbar spine films showed degenerative joint disease in the RIGHT hip and scoliosis and degenerative joint disease and disc disease in the lumbar spine.  Her symptoms are minor at this point in terms of surgical intervention and she would be better served at an interventionalist. Where she can have injections and/or ablation. Therapies, physical therapy, etc.  1. Back pain, lumbosacral

## 2011-12-05 NOTE — Patient Instructions (Signed)
Referral to Dr Eduard Clos for Back Pain

## 2011-12-09 ENCOUNTER — Telehealth: Payer: Self-pay | Admitting: Radiology

## 2011-12-09 NOTE — Telephone Encounter (Signed)
I faxed a referral for this this patient to Dr. Eduard Clos for back pain.

## 2011-12-10 NOTE — Progress Notes (Signed)
Reminder in epic to follow up in 6 months with SF in E30 °

## 2011-12-25 ENCOUNTER — Encounter: Payer: Self-pay | Admitting: *Deleted

## 2012-01-28 ENCOUNTER — Telehealth: Payer: Self-pay | Admitting: *Deleted

## 2012-01-28 NOTE — Telephone Encounter (Signed)
I left a message for Deborah Farrell to call us back in regards to scheduling a colonoscopy for her.

## 2012-02-26 ENCOUNTER — Encounter: Payer: Self-pay | Admitting: Gastroenterology

## 2012-02-26 ENCOUNTER — Ambulatory Visit (INDEPENDENT_AMBULATORY_CARE_PROVIDER_SITE_OTHER): Payer: Medicare Other | Admitting: Gastroenterology

## 2012-02-26 VITALS — BP 131/67 | HR 68 | Temp 97.6°F | Ht 63.0 in | Wt 158.2 lb

## 2012-02-26 DIAGNOSIS — D369 Benign neoplasm, unspecified site: Secondary | ICD-10-CM

## 2012-02-26 DIAGNOSIS — K219 Gastro-esophageal reflux disease without esophagitis: Secondary | ICD-10-CM

## 2012-02-26 NOTE — Assessment & Plan Note (Addendum)
DYSPEPSIA RESOLVED.  MAY NEED PERIODIC BID PROTONIX. NOW ON PROTONIX QD. OPV IN 6 MOS.

## 2012-02-26 NOTE — Progress Notes (Signed)
Subjective:    Patient ID: Deborah Farrell, female    DOB: 21-Jan-1917, 79 y.o.   MRN: [id number]  PCP: HALL  HPI SAW ME. FINALLY STARTED TAKING PROTONIX BID FOR 3 MOS AND NOW FEELS BETTER.  STILL WORKING OUT.SAW DR. HARRISON. HAS ARTHRITIS IN HER BACK. WENT TO PAIN MANAGEMENT. HAD ONE TREATMENT AND IT HELPED.  Past Medical History  Diagnosis Date  . 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 IBS (irritable bowel syndrome)     Non-ulcer dyspepsia; hemorrhoids  . HTN (hypertension)   . Glaucoma(365)   . Allergy history, radiographic dye   . Urinary tract infection   . Arthritis    Past Medical History  Diagnosis Date  . 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 IBS (irritable bowel syndrome)     Non-ulcer dyspepsia; hemorrhoids  . HTN (hypertension)   . Glaucoma(365)   . Allergy history, radiographic dye   . Urinary tract infection   . Arthritis     Allergies  Allergen Reactions  . Aciphex (Rabeprazole) Other (See Comments)    intolerance  . Dicyclomine Hcl     REACTION: Unknown reaction  . Hyoscyamine Sulfate     REACTION: Unknown reaction  . Iodinated Diagnostic Agents   . Nexium (Esomeprazole Magnesium)     GI UPSET  . Omeprazole Other (See Comments)    GI upset  . Sulfonamide Derivatives     REACTION: Unknown reaction    Current Outpatient Prescriptions  Medication Sig Dispense Refill  . acetaminophen (TYLENOL) 500 MG tablet Take 650 mg by mouth 3 (three) times daily as needed. As needed      . amLODipine-valsartan (EXFORGE) 5-320 MG per tablet Take 1 tablet by mouth daily.        . carvedilol (COREG) 12.5 MG tablet Take 1 tablet (12.5 mg total) by mouth 2 (two) times daily with a meal.    . chlorthalidone  (HYGROTON) 25 MG tablet Take 0.5 tablets (12.5 mg total) by mouth daily. Take 1/2 tablet daily    . fish oil-omega-3 fatty acids 1000 MG capsule Take 1 g by mouth daily.      Marland Kitchen latanoprost (XALATAN) 0.005 % ophthalmic solution Place 1 drop into both eyes at bedtime.      . Multiple Vitamins-Minerals (MULTI COMPLETE PO) Take by mouth.      . pantoprazole (PROTONIX) 40 MG tablet 1 PO 30 MINUTES PRIOR TO MEALS BID FOR 3 MOS THEN QD    . Probiotic Product (PHILLIPS COLON HEALTH PO) Take by mouth.      . solifenacin (VESICARE) 5 MG tablet Take 5 mg by mouth daily.      Marland Kitchen ALPRAZolam (XANAX) 0.25 MG tablet Take 0.25 mg by mouth at bedtime as needed. One tablet every 6 hours as needed     .       No current facility-administered medications for this visit.      Review of Systems     Objective:   Physical Exam  Vitals reviewed. Constitutional: She is oriented to person, place, and time. She appears well-nourished. No distress.  HENT:  Head: Normocephalic and atraumatic.  Mouth/Throat: Oropharynx is clear and moist. No oropharyngeal exudate.  Eyes: Pupils are equal, round, and reactive to light. No scleral  icterus.  Cardiovascular: Normal rate, regular rhythm and normal heart sounds.   Pulmonary/Chest: Effort normal and breath sounds normal. No respiratory distress.  Abdominal: Soft. Bowel sounds are normal. She exhibits no distension. There is no tenderness.  Neurological: She is alert and oriented to person, place, and time.  NO  NEW FOCAL DEFICITS   Psychiatric: She has a normal mood and affect.          Assessment & Plan:

## 2012-02-26 NOTE — Patient Instructions (Signed)
CONTINUE PROTONIX.   CALL ME TO SCHEDULE YOUR COLONOSCOPY IN APR 2014.  FOLLOW UP IN 6 MOS.

## 2012-02-26 NOTE — Assessment & Plan Note (Signed)
NO WARNING S/SX.  NEXT TCS APR 2014. CONSIDER ULTRA SLIM TCS.

## 2012-02-26 NOTE — Progress Notes (Signed)
Faxed to PCP

## 2012-03-16 NOTE — Progress Notes (Signed)
Reminder in epic to follow up in 6 months °

## 2012-03-19 ENCOUNTER — Ambulatory Visit: Payer: Self-pay | Admitting: Adult Health

## 2012-03-31 ENCOUNTER — Telehealth: Payer: Self-pay | Admitting: Cardiology

## 2012-03-31 MED ORDER — CHLORTHALIDONE 25 MG PO TABS: 12.5000 mg | ORAL_TABLET | Freq: Every day | ORAL | Status: DC

## 2012-03-31 NOTE — Telephone Encounter (Signed)
Needs refill on Chlorthalidone sent to [facility]. / tgs

## 2012-03-31 NOTE — Telephone Encounter (Signed)
Refill completed #45 tabs with 3 refills (90 day supply)

## 2012-04-03 ENCOUNTER — Telehealth: Payer: Self-pay

## 2012-04-03 NOTE — Telephone Encounter (Signed)
Pt came by the office. Said she has some intermittent pain on her right side below her breast. It occurs especially when she gets ready to have a BM in the morning. She said she is not constipated. BM's are regular and normal. But she also gets a little nauseated at that time.   She said she saw Dr. Dwana Melena, PCP, last week and he did some tests ( of what she is not sure exactly what), and she hasn't heard from them.  I told her I thought it would be good to go by his office today and check on those results since Dr.Fields is on vacation and will not be back until Tues. She said she will do so, and if Dr. Evelina Dun would like to see her when she returns, that will be OK.

## 2012-04-09 ENCOUNTER — Other Ambulatory Visit: Payer: Self-pay | Admitting: Gastroenterology

## 2012-04-09 DIAGNOSIS — Z86010 Personal history of colonic polyps: Secondary | ICD-10-CM

## 2012-04-09 MED ORDER — PEG-KCL-NACL-NASULF-NA ASC-C 100 G PO SOLR: 1.0000 | ORAL | Status: DC

## 2012-04-09 NOTE — Telephone Encounter (Signed)
SEEING PAIN MANAGEMENT FOR HER BACK. HAD A DRAWING IN HER CHEST WHOLE.  USING A CANE ONCE IN A WHILE. USING IT MORE THAN 1 OR 2 TIMES A WEEK. ALSO USING ARMS/LEGS ON BICYCLE 2 TIMES/WEEK FOR 30 MINS. ROUTINE HAD BEEN DISRUPTED DUE TO WEATHER. DOES HAVE A BICYCLE AT HOME BUT DOESN'T USE HER UPPER BODY. WENT TO SEE DR. HALL. TESTS RUN & EVERYTHING LOOKED GOOD. SX GETTING BETTER. TAKING GAS-X.  SCHEDULE TCS IN ONE MO FOR ADVANCED POLYP. NEEDS ULTRALSLIM TCS/OVERTUBE. MOVIPREP.

## 2012-04-09 NOTE — Telephone Encounter (Signed)
Patient is scheduled for TCS w/SLF on Friday April 11 at 10:45 and Mrs. Sarris is aware and I have sent her the instructions and Prep was sent to the pharmacy

## 2012-04-23 MED ORDER — SODIUM CHLORIDE 0.9 % IV SOLN
INTRAVENOUS | Status: DC
  Administered 2012-04-24: 10:00:00 via INTRAVENOUS

## 2012-04-24 ENCOUNTER — Encounter (HOSPITAL_COMMUNITY)
Admission: RE | Disposition: A | Discharge status: Home / Self Care | Payer: Self-pay | Source: Ambulatory Visit | Attending: Gastroenterology

## 2012-04-24 ENCOUNTER — Ambulatory Visit (HOSPITAL_COMMUNITY)
Admission: RE | Admit: 2012-04-24 | Discharge: 2012-04-24 | Disposition: A | Discharge status: Home / Self Care | Payer: Medicare Other | Source: Ambulatory Visit | Attending: Gastroenterology | Admitting: Gastroenterology

## 2012-04-24 ENCOUNTER — Encounter (HOSPITAL_COMMUNITY): Payer: Self-pay | Admitting: *Deleted

## 2012-04-24 DIAGNOSIS — K621 Rectal polyp: Secondary | ICD-10-CM

## 2012-04-24 DIAGNOSIS — K5730 Diverticulosis of large intestine without perforation or abscess without bleeding: Secondary | ICD-10-CM

## 2012-04-24 DIAGNOSIS — D128 Benign neoplasm of rectum: Secondary | ICD-10-CM | POA: Insufficient documentation

## 2012-04-24 DIAGNOSIS — K648 Other hemorrhoids: Secondary | ICD-10-CM | POA: Insufficient documentation

## 2012-04-24 DIAGNOSIS — Z860100 Personal history of colon polyps, unspecified: Secondary | ICD-10-CM | POA: Insufficient documentation

## 2012-04-24 DIAGNOSIS — Z1211 Encounter for screening for malignant neoplasm of colon: Secondary | ICD-10-CM

## 2012-04-24 DIAGNOSIS — I10 Essential (primary) hypertension: Secondary | ICD-10-CM | POA: Insufficient documentation

## 2012-04-24 DIAGNOSIS — K620 Anal polyp: Secondary | ICD-10-CM

## 2012-04-24 HISTORY — PX: COLONOSCOPY: SHX5424

## 2012-04-24 SURGERY — COLONOSCOPY
Anesthesia: Moderate Sedation

## 2012-04-24 MED ORDER — MIDAZOLAM HCL 5 MG/5ML IJ SOLN
INTRAMUSCULAR | Status: AC
  Filled 2012-04-24: qty 5

## 2012-04-24 MED ORDER — MEPERIDINE HCL 100 MG/ML IJ SOLN
INTRAMUSCULAR | Status: DC | PRN
  Administered 2012-04-24 (×2): 25 mg via INTRAVENOUS

## 2012-04-24 MED ORDER — STERILE WATER FOR IRRIGATION IR SOLN
Status: DC | PRN
  Administered 2012-04-24: 10:00:00

## 2012-04-24 MED ORDER — MIDAZOLAM HCL 5 MG/5ML IJ SOLN
INTRAMUSCULAR | Status: DC | PRN
  Administered 2012-04-24: 1 mg via INTRAVENOUS
  Administered 2012-04-24: 2 mg via INTRAVENOUS

## 2012-04-24 MED ORDER — MEPERIDINE HCL 100 MG/ML IJ SOLN
INTRAMUSCULAR | Status: AC
  Filled 2012-04-24: qty 1

## 2012-04-24 NOTE — Op Note (Signed)
[facility] [address] [place], [address]   COLONOSCOPY PROCEDURE REPORT  PATIENT: Deborah Farrell, Deborah Farrell  MR#: [id number] BIRTHDATE: 1917/10/29 , 89  yrs. old GENDER: Female ENDOSCOPIST: Jonette Eva, MD REFERRED ON:GEXB Hall, M.D. PROCEDURE DATE:  04/24/2012 PROCEDURE:   Colonoscopy (ULTRASLIM) with cold biopsy polypectomy INDICATIONS:High risk patient with personal history of colonic polyps. MEDICATIONS: Demerol 50 mg IV and Versed 3 mg IV  DESCRIPTION OF PROCEDURE:    Physical exam was performed.  Informed consent was obtained from the patient after explaining the benefits, risks, and alternatives to procedure.  The patient was connected to monitor and placed in left lateral position. Continuous oxygen was provided by nasal cannula and IV medicine administered through an indwelling cannula.  After administration of sedation and rectal exam, the patients rectum was intubated and the EC-[id number] ([id number])  colonoscope was advanced under direct visualization to the cecum.  The scope was removed slowly by carefully examining the color, texture, anatomy, and integrity mucosa on the way out.  The patient was recovered in endoscopy and discharged home in satisfactory condition.     COLON FINDINGS: The colon IS redundant.  UNABLE TO ADVANCE ENTRADA OVERTUBE. Manual abdominal counter-pressure was used to reach the cecum.  The patient was moved on to their back to reach the cecum, A single polyp measuring 3 mm in size was found in the rectum.  A polypectomy was performed with cold forceps.  , There was moderate diverticulosis noted in the ascending colon, descending colon, and sigmoid colon with associated muscular hypertrophy.  , and Small internal hemorrhoids were found.  PREP QUALITY: good. CECAL W/D TIME: 14 minutes COMPLICATIONS: None  ENDOSCOPIC IMPRESSION: 1.   The colon IS redundant 2.   Single RECTAL polyp 3.   Moderate diverticulosis 4.   Small  internal hemorrhoids  RECOMMENDATIONS: AWAIT BIOPSY HIGH FIBER DIET TCS IN 5 YEARS WITH PEDS SCOPE IF BENEFITS OUTWEIGH THE RISKS       _______________________________ Rosalie DoctorJonette Eva, MD 04/24/2012 4:42 PM     PATIENT NAME:  Manning Charity MR#: [id number]

## 2012-04-24 NOTE — H&P (Signed)
Primary Care Physician:  Dwana Melena, MD Primary Gastroenterologist:  Dr. Darrick Penna  Pre-Procedure History & Physical: HPI:  Deborah Farrell is a 79 y.o. female here for  PERSONAL HISTORY OF POLYPS.  Past Medical History  Diagnosis Date  . 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 IBS (irritable bowel syndrome)     Non-ulcer dyspepsia; hemorrhoids  . HTN (hypertension)   . Glaucoma(365)   . Allergy history, radiographic dye   . Urinary tract infection   . Arthritis     Past Surgical History  Procedure Laterality Date  . Appendectomy    . Vaginal hysterectomy    . Ovarian cyst removal    . Colonoscopy  2008, 2011    diverticulitis diagnosed, 1mm sessile polyp removed inascending colon. area tattood for future observation and reccomended f/u colonoscopy in one year    Prior to Admission medications   Medication Sig Start Date End Date Taking? Authorizing Provider  acetaminophen (TYLENOL) 500 MG tablet Take 650 mg by mouth 3 (three) times daily as needed for pain.    Yes Historical Provider, MD  ALPRAZolam Prudy Feeler) 0.25 MG tablet Take 0.25 mg by mouth at bedtime as needed for anxiety.    Yes Historical Provider, MD  amLODipine-valsartan (EXFORGE) 5-320 MG per tablet Take 1 tablet by mouth daily.     Yes Historical Provider, MD  carvedilol (COREG) 12.5 MG tablet Take 1 tablet (12.5 mg total) by mouth 2 (two) times daily with a meal. 11/18/11 11/17/12 Yes Kathlen Brunswick, MD  chlorthalidone (HYGROTON) 25 MG tablet Take 12.5 mg by mouth daily. 03/31/12  Yes Kathlen Brunswick, MD  fish oil-omega-3 fatty acids 1000 MG capsule Take 1 g by mouth daily.     Yes Historical Provider, MD  latanoprost (XALATAN) 0.005 % ophthalmic solution Place 1 drop into both eyes at bedtime.     Yes Historical Provider, MD  Multiple Vitamins-Minerals (MULTI COMPLETE PO) Take 1 tablet by  mouth daily.    Yes Historical Provider, MD  pantoprazole (PROTONIX) 40 MG tablet Take 40 mg by mouth daily. 10/23/11  Yes West Bali, MD  peg 3350 powder (MOVIPREP) 100 G SOLR Take 1 kit (100 g total) by mouth as directed. 04/09/12  Yes West Bali, MD  Probiotic Product (PHILLIPS COLON HEALTH PO) Take 1 capsule by mouth daily.    Yes Historical Provider, MD  solifenacin (VESICARE) 5 MG tablet Take 5 mg by mouth daily.     Yes Historical Provider, MD    Allergies as of 04/09/2012 - Review Complete 02/26/2012  Allergen Reaction Noted  . Aciphex (rabeprazole) Other (See Comments) 07/25/2011  . Dicyclomine hcl    . Hyoscyamine sulfate    . Iodinated diagnostic agents  11/21/2010  . Nexium (esomeprazole magnesium)  08/21/2011  . Omeprazole Other (See Comments) 07/25/2010  . Sulfonamide derivatives      Family History  Problem Relation Age of Onset  . Stroke Mother   . Heart attack Sister   . Heart disease Sister     History   Social History  . Marital Status: Widowed    Spouse Name: N/A    Number of Children: N/A  . Years of Education: N/A   Occupational History  . retired    Social History Main Topics  . Smoking status: Never Smoker   . Smokeless tobacco: Never Used  . Alcohol  Use: No  . Drug Use: No  . Sexually Active: No   Other Topics Concern  . Not on file   Social History Narrative  . No narrative on file    Review of Systems: See HPI, otherwise negative ROS   Physical Exam: BP 132/62  Pulse 61  Temp(Src) 97.6 F (36.4 C) (Oral)  Resp 12  Ht 5\' 3"  (1.6 m)  Wt 158 lb (71.668 kg)  BMI 28 kg/m2  SpO2 95% General:   Alert,  pleasant and cooperative in NAD Head:  Normocephalic and atraumatic. Neck:  Supple; Lungs:  Clear throughout to auscultation.    Heart:  Regular rate and rhythm. Abdomen:  Soft, nontender and nondistended. Normal bowel sounds, without guarding, and without rebound.   Neurologic:  Alert and  oriented x4;  grossly normal  neurologically.  Impression/Plan:    PERSONAL HISTORY OF POLYPS.  PLAN:  1. TCS TODAY

## 2012-04-28 ENCOUNTER — Ambulatory Visit (INDEPENDENT_AMBULATORY_CARE_PROVIDER_SITE_OTHER): Payer: Medicare Other | Admitting: Adult Health

## 2012-04-28 ENCOUNTER — Encounter: Payer: Self-pay | Admitting: Adult Health

## 2012-04-28 ENCOUNTER — Telehealth: Payer: Self-pay | Admitting: Gastroenterology

## 2012-04-28 VITALS — BP 137/90 | HR 62 | Ht 65.0 in | Wt 159.0 lb

## 2012-04-28 DIAGNOSIS — E785 Hyperlipidemia, unspecified: Secondary | ICD-10-CM

## 2012-04-28 DIAGNOSIS — I10 Essential (primary) hypertension: Secondary | ICD-10-CM

## 2012-04-28 NOTE — Patient Instructions (Addendum)
Your physician recommends that you schedule a follow-up appointment in: 12 months.  

## 2012-04-28 NOTE — Telephone Encounter (Signed)
PLEASE CALL PT. SHE HAD A BENIGN POLYP REMOVED. CONSIDER RISK V. BENEFITS OF REPEAT TCS IN 5 YEARS.

## 2012-04-28 NOTE — Assessment & Plan Note (Signed)
Essentially good control of blood pressure. She states it is usually much lower at home and in her primary care physician office. She is medically compliant. Authorize refills on all antihypertensives. We will see her again in one year unless she becomes symptomatic.

## 2012-04-28 NOTE — Progress Notes (Signed)
HPI:Deborah Farrell is a 79 year old patient of Dr. Dietrich Pates we are following for ongoing assessment and management of hypertension, with history of hyperlipidemia, nephrectomy, and IBS. She was last seen in the office in September of 2013 for annual evaluation. She has had recent labs in March of 2014 revealed mildly elevated creatinine 1.26 she is also to be mildly anemic with a hemoglobin 11.0 hematocrit 33.3, cholesterol was 211 triglycerides 94 HDL 50 LDL 42. She has had a recent colonoscopy per Dr. Darrick Penna. on 04/24/2012. He states she was found to have diverticulosis and a polyp. He denies any hospitalizations, ER visits, or new diagnoses other than diverticulosis. She did have some "drawing pain" in her chest a couple weeks ago. She saw her primary care physician who felt was gas, gave her some Gas-X and she has not had any further complaints.  Allergies  Allergen Reactions  . Aciphex (Rabeprazole) Other (See Comments)    intolerance  . Dicyclomine Hcl Other (See Comments)    Unknown  . Hyoscyamine Sulfate Other (See Comments)    Unknown  . Iodinated Diagnostic Agents   . Nexium (Esomeprazole Magnesium) Other (See Comments)    G.I. Upset  . Omeprazole Other (See Comments)    GI upset  . Sulfonamide Derivatives Other (See Comments)    Unknown    Current Outpatient Prescriptions  Medication Sig Dispense Refill  . acetaminophen (TYLENOL) 500 MG tablet Take 650 mg by mouth 3 (three) times daily as needed for pain.       Marland Kitchen ALPRAZolam (XANAX) 0.25 MG tablet Take 0.25 mg by mouth at bedtime as needed for anxiety.       Marland Kitchen amLODipine-valsartan (EXFORGE) 5-320 MG per tablet Take 1 tablet by mouth daily.        . carvedilol (COREG) 12.5 MG tablet Take 1 tablet (12.5 mg total) by mouth 2 (two) times daily with a meal.  180 tablet  3  . chlorthalidone (HYGROTON) 25 MG tablet Take 12.5 mg by mouth daily.      . fish oil-omega-3 fatty acids 1000 MG capsule Take 1 g by mouth daily.        Marland Kitchen  latanoprost (XALATAN) 0.005 % ophthalmic solution Place 1 drop into both eyes at bedtime.        . Multiple Vitamins-Minerals (MULTI COMPLETE PO) Take 1 tablet by mouth daily.       . pantoprazole (PROTONIX) 40 MG tablet Take 40 mg by mouth daily.      . Probiotic Product (PHILLIPS COLON HEALTH PO) Take 1 capsule by mouth daily.       . solifenacin (VESICARE) 5 MG tablet Take 5 mg by mouth daily.         No current facility-administered medications for this visit.    Past Medical History  Diagnosis Date  . 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 IBS (irritable bowel syndrome)     Non-ulcer dyspepsia; hemorrhoids  . HTN (hypertension)   . Glaucoma(365)   . Allergy history, radiographic dye   . Urinary tract infection   . Arthritis     Past Surgical History  Procedure Laterality Date  . Appendectomy    . Vaginal hysterectomy    . Ovarian cyst removal    . Colonoscopy  2008, 2011    diverticulitis diagnosed, 1mm sessile polyp removed inascending colon. area tattood for future observation and  reccomended f/u colonoscopy in one year    ROS: Review of systems complete and found to be negative unless listed above  PHYSICAL EXAM BP 137/90  Pulse 62  Ht 5\' 5"  (1.651 m)  Wt 159 lb (72.122 kg)  BMI 26.46 kg/m2  SpO2 95%  General: Well developed, well nourished, in no acute distress Head: Eyes PERRLA, No xanthomas.   Normal cephalic and atramatic  Lungs: Clear bilaterally to auscultation and percussion. Heart: HRRR S1 S2, without MRG.  Pulses are 2+ & equal.            No carotid bruit. No JVD.  No abdominal bruits. No femoral bruits. Abdomen: Bowel sounds are positive, abdomen soft and non-tender without masses or                  Hernia's noted. Msk:  Back normal, normal gait. Normal strength and tone for age. Extremities: No clubbing, cyanosis or  edema.  DP +1 Neuro: Alert and oriented X 3. Psych:  Good affect, responds appropriately  EKG:NSR with nonspecific T-ave flattening lead I, V1 and V4-V6. Rate of 61 bpm.  ASSESSMENT AND PLAN

## 2012-04-28 NOTE — Telephone Encounter (Signed)
Called and informed pt.  

## 2012-04-28 NOTE — Progress Notes (Deleted)
Name: Deborah Farrell    DOB: March 23, 1917  Age: 79 y.o.  MR#: [id number]       PCP:  Dwana Melena, MD      Insurance: Payor: [employer] [place] [employer]  Plan: BLUE MEDICARE  Product Type: *No Product type*    CC:    Chief Complaint  Patient presents with  . Hypertension    VS Filed Vitals:   04/28/12 1400  BP: 137/90  Pulse: 62  Height: 5\' 5"  (1.651 m)  Weight: 159 lb (72.122 kg)  SpO2: 95%    Weights Current Weight  04/28/12 159 lb (72.122 kg)  04/24/12 158 lb (71.668 kg)  04/24/12 158 lb (71.668 kg)    Blood Pressure  BP Readings from Last 3 Encounters:  04/28/12 137/90  04/24/12 102/49  04/24/12 102/49     Admit date:  (Not on file) Last encounter with RMR:  03/19/2012   Allergy Aciphex; Dicyclomine hcl; Hyoscyamine sulfate; Iodinated diagnostic agents; Nexium; Omeprazole; and Sulfonamide derivatives  Current Outpatient Prescriptions  Medication Sig Dispense Refill  . acetaminophen (TYLENOL) 500 MG tablet Take 650 mg by mouth 3 (three) times daily as needed for pain.       Marland Kitchen ALPRAZolam (XANAX) 0.25 MG tablet Take 0.25 mg by mouth at bedtime as needed for anxiety.       Marland Kitchen amLODipine-valsartan (EXFORGE) 5-320 MG per tablet Take 1 tablet by mouth daily.        . carvedilol (COREG) 12.5 MG tablet Take 1 tablet (12.5 mg total) by mouth 2 (two) times daily with a meal.  180 tablet  3  . chlorthalidone (HYGROTON) 25 MG tablet Take 12.5 mg by mouth daily.      . fish oil-omega-3 fatty acids 1000 MG capsule Take 1 g by mouth daily.        Marland Kitchen latanoprost (XALATAN) 0.005 % ophthalmic solution Place 1 drop into both eyes at bedtime.        . Multiple Vitamins-Minerals (MULTI COMPLETE PO) Take 1 tablet by mouth daily.       . pantoprazole (PROTONIX) 40 MG tablet Take 40 mg by mouth daily.      . Probiotic Product (PHILLIPS COLON HEALTH PO) Take 1 capsule by mouth daily.       . solifenacin (VESICARE) 5 MG tablet Take 5 mg by mouth daily.         No current  facility-administered medications for this visit.    Discontinued Meds:   There are no discontinued medications.  Patient Active Problem List  Diagnosis  . HYPERLIPIDEMIA  . GLAUCOMA  . HYPERTENSION  . Gastroesophageal reflux disease  . IRRITABLE BOWEL SYNDROME  . Tubulovillous adenoma  . Abdominal bloating  . Diarrhea  . Hip pain, right  . Back pain, lumbosacral    LABS    Component Value Date/Time   NA 135 11/03/2011 0000   NA 140 05/20/2011 1417   NA 140 12/24/2010 0835   K 3.9 11/03/2011 0000   K 4.3 05/20/2011 1417   K 4.9 12/24/2010 0835   CL 99 11/03/2011 0000   CL 100 05/20/2011 1417   CL 103 12/24/2010 0835   CO2 26 11/03/2011 0000   CO2 28 05/20/2011 1417   CO2 27 12/24/2010 0835   GLUCOSE 113* 11/03/2011 0000   GLUCOSE 85 05/20/2011 1417   GLUCOSE 96 12/24/2010 0835   BUN 36* 11/03/2011 0000   BUN 26* 05/20/2011 1417   BUN 36* 12/24/2010 4259  CREATININE 1.49* 11/03/2011 0000   CREATININE 1.04 05/20/2011 1417   CREATININE 1.19* 12/24/2010 0835   CREATININE 1.33* 11/07/2010 0925   CREATININE 0.99 12/05/2009 1531   CREATININE 1.66* 01/05/2008 1350   CALCIUM 9.8 11/03/2011 0000   CALCIUM 10.5 05/20/2011 1417   CALCIUM 9.9 12/24/2010 0835   GFRNONAA 29* 11/03/2011 0000   GFRNONAA 53* 12/05/2009 1531   GFRNONAA 29* 01/05/2008 1350   GFRAA 34* 11/03/2011 0000   GFRAA  Value: >60        The eGFR has been calculated using the MDRD equation. This calculation has not been validated in all clinical situations. eGFR's persistently <60 mL/min signify possible Chronic Kidney Disease. 12/05/2009 1531   GFRAA  Value: 35        The eGFR has been calculated using the MDRD equation. This calculation has not been validated in all clinical* 01/05/2008 1350   CMP     Component Value Date/Time   NA 135 11/03/2011 0000   K 3.9 11/03/2011 0000   CL 99 11/03/2011 0000   CO2 26 11/03/2011 0000   GLUCOSE 113* 11/03/2011 0000   BUN 36* 11/03/2011 0000   CREATININE 1.49* 11/03/2011  0000   CREATININE 1.04 05/20/2011 1417   CALCIUM 9.8 11/03/2011 0000   PROT 8.0 05/20/2011 1417   ALBUMIN 4.7 05/20/2011 1417   AST 20 05/20/2011 1417   ALT 13 05/20/2011 1417   ALKPHOS 49 05/20/2011 1417   BILITOT 0.5 05/20/2011 1417   GFRNONAA 29* 11/03/2011 0000   GFRAA 34* 11/03/2011 0000       Component Value Date/Time   WBC 8.3 11/03/2011 0000   WBC 8.7 05/20/2011 1417   WBC 9.3 01/05/2008 1350   HGB 10.6* 11/03/2011 0000   HGB 12.1 05/20/2011 1417   HGB 10.4* 01/05/2008 1350   HCT 31.8* 11/03/2011 0000   HCT 37.3 05/20/2011 1417   HCT 30.8* 01/05/2008 1350   MCV 95.8 11/03/2011 0000   MCV 95.9 05/20/2011 1417   MCV 95.3 01/05/2008 1350    Lipid Panel     Component Value Date/Time   CHOL 208* 12/24/2010 0835   TRIG 122 12/24/2010 0835   HDL 48 12/24/2010 0835   CHOLHDL 4.3 12/24/2010 0835   VLDL 24 12/24/2010 0835   LDLCALC 136* 12/24/2010 0835    ABG No results found for this basename: phart, pco2, pco2art, po2, po2art, hco3, tco2, acidbasedef, o2sat     No results found for this basename: TSH   BNP (last 3 results) No results found for this basename: PROBNP,  in the last 8760 hours Cardiac Panel (last 3 results) No results found for this basename: CKTOTAL, CKMB, TROPONINI, RELINDX,  in the last 72 hours  Iron/TIBC/Ferritin No results found for this basename: iron, tibc, ferritin     EKG Orders placed in visit on 04/28/12  . EKG  . EKG 12-LEAD     Prior Assessment and Plan Problem List as of 04/28/2012     ICD-9-CM   HYPERLIPIDEMIA   Last Assessment & Plan   05/20/2011 Office Visit Written 05/20/2011  2:38 PM by Kathlen Brunswick, MD     Mild to moderate hyperlipidemia with no known vascular disease.  At age 34 without substantial cardiovascular risk, pharmacologic therapy is not necessary.    GLAUCOMA   HYPERTENSION   Last Assessment & Plan   09/18/2011 Office Visit Written 09/18/2011  1:36 PM by Jodelle Gross, NP     Blood pressure has excellent control on  current medication regimen. She has some complaints of some mild lower extremity edema at the end of the day that has dissipated by the following morning. I explained to her that this is probably medication induced with the use of amlodipine. If her edema becomes significant, she can take a total of 25 mg of chlorthalidone on a when necessary basis only and continue with her usual dose of 12.5 milligrams daily as directed.     Gastroesophageal reflux disease   Last Assessment & Plan   02/26/2012 Office Visit Edited 02/26/2012 11:24 AM by West Bali, MD     DYSPEPSIA RESOLVED.  MAY NEED PERIODIC BID PROTONIX. NOW ON PROTONIX QD. OPV IN 6 MOS.    IRRITABLE BOWEL SYNDROME   Last Assessment & Plan   05/08/2011 Office Visit Written 05/08/2011 10:33 AM by West Bali, MD     Sx CONTROLLED.  CONTINUE PHILLIP'S COLON HEALTH.    Tubulovillous adenoma   Last Assessment & Plan   02/26/2012 Office Visit Written 02/26/2012 11:23 AM by West Bali, MD     NO WARNING S/SX.  NEXT TCS APR 2014. CONSIDER ULTRA SLIM TCS.    Abdominal bloating   Last Assessment & Plan   07/19/2011 Office Visit Edited 07/19/2011  8:41 AM by Joselyn Arrow, NP     Increased abdominal bloating & loose stools.  Recent antibiotics for UTI.  R/o c diff.  Underlying IBS may be contributing.  Pt believes may be due to protonix.  Stop Protonix Start Aciphex 20mg  daily Continue [facility]'s Colon Health probiotic Return stool to lab ASAP (C diff PCR)    Diarrhea   Last Assessment & Plan   11/20/2011 Office Visit Written 11/20/2011  9:28 AM by West Bali, MD     WITH PROTONIX.    Hip pain, right   Last Assessment & Plan   11/20/2011 Office Visit Written 11/20/2011  9:29 AM by West Bali, MD     XRAYS AND REFER TO DR. HARRISON.    Back pain, lumbosacral   Last Assessment & Plan   11/20/2011 Office Visit Written 11/20/2011  9:30 AM by West Bali, MD     DIFFERENTIAL DIAGNOSIS: DDD, AVASCULAR NECROSIS, SI JOINT  INFLAMMATION.  XRAYS AND REFER TO DR. HARRISON.        Imaging: No results found.

## 2012-04-28 NOTE — Assessment & Plan Note (Signed)
She is recently had lab work completed by Dr. [name] last week. No changes in her medication regimen.

## 2012-04-28 NOTE — Telephone Encounter (Signed)
Cc PCP 

## 2012-04-29 ENCOUNTER — Telehealth: Payer: Self-pay | Admitting: Gastroenterology

## 2012-04-29 ENCOUNTER — Encounter (HOSPITAL_COMMUNITY): Payer: Self-pay | Admitting: Gastroenterology

## 2012-04-29 MED ORDER — PANTOPRAZOLE SODIUM 40 MG PO TBEC: 40.0000 mg | DELAYED_RELEASE_TABLET | Freq: Two times a day (BID) | ORAL | Status: DC

## 2012-04-29 NOTE — Telephone Encounter (Signed)
Will right for BID. May need BID periodically but if she feels okay while taking just once daily then she can do daily only.  I sent RX to [facility]. Please make sure that is correct.

## 2012-04-29 NOTE — Telephone Encounter (Signed)
Pt is aware and that was correct.

## 2012-04-29 NOTE — Telephone Encounter (Signed)
Patient came by front window this morning asking about getting her mail order protonix 40 mg refilled for a 90 day supply and does SF want her to take it twice a day. She uses [facility] and if there's any questions you can reach her at [phone]

## 2012-05-26 ENCOUNTER — Telehealth: Payer: Self-pay

## 2012-05-26 NOTE — Telephone Encounter (Signed)
Pt came by the office. She said that last evening she had a BM and had bright red blood in the stool. When she wiped there was no blood  on the tissue. It happened again this AM. She said she is not constipated and has regular normal BM's. Said she does have a hx of internal hemorrhoids.  Please advise!

## 2012-05-26 NOTE — Telephone Encounter (Signed)
Pt called to see if I had heard from Dr. Darrick Penna. I told her she has not responded to the note. She has been very busy and will probably answer the note a little later. I told her it sound like her hemorrhoids, and if she started having bleeding to go to the ED. Told her I will try to have a message for her early in the AM.

## 2012-05-27 NOTE — Telephone Encounter (Signed)
Likely hemorrhoidal bleeding given TCS findings one month ago. If she is having anorectal irritation we can see in Anusol 25mg  sup BID for 10 days. If doing ok and no further bleeding, I would just monitor for now.  She should call us if ongoing bleeding. If large volume bleeding, lightheadedness, she should go to ER.

## 2012-05-27 NOTE — Telephone Encounter (Signed)
Deborah Farrell, this pt will be calling back this AM if I do not call her. Dr. Darrick Penna is off the remainder of the week. Please advise!

## 2012-05-27 NOTE — Telephone Encounter (Signed)
Called and informed pt. She has not had any more bleeding and no irritation. She will monitor for now and let us know if any problems and go to ED if large volume bleeding.

## 2012-05-29 NOTE — Telephone Encounter (Signed)
REVIEWED. AGREE. 

## 2012-06-04 NOTE — Telephone Encounter (Signed)
Called patient TO DISCUSS CONCERNS. LVM-CALL [phone] TO DISCUSS.  

## 2012-06-04 NOTE — Telephone Encounter (Signed)
REVIEWED.  

## 2012-06-04 NOTE — Telephone Encounter (Signed)
Pt called. She said she is not having any problems, has not seen ay more blood. [place] is [place] She will call if she has any more concerns.

## 2012-06-05 ENCOUNTER — Telehealth: Payer: Self-pay

## 2012-06-05 NOTE — Telephone Encounter (Signed)
Called and informed pt.  

## 2012-06-05 NOTE — Telephone Encounter (Signed)
PLEASE CALL PT. She is not bleeding internally. Hemorrhoid bleeding starts and stops. If she can exercise, she is fine.

## 2012-06-05 NOTE — Telephone Encounter (Signed)
Pt came by the office and said she didn't get to talk to Dr. Darrick Penna yesterday. She is doing fine and hasn't seen any more blood in stool/paper. She was just wondering if she could be bleeding inside. I asked her what made her ask that question. She said since she hasn't seen any more blood outside. I asked her now she felt and she said "I feel fine". [place] [facility] [place] about 40 min. I told her it sounds like she is fine, if she was losing blood, she would not feel so strong and able to do all she is doing. She said she just needed some reassurance.

## 2012-06-30 ENCOUNTER — Telehealth: Payer: Self-pay

## 2012-06-30 NOTE — Telephone Encounter (Signed)
LMOM to call.

## 2012-06-30 NOTE — Telephone Encounter (Signed)
Pt came by and was informed.  

## 2012-06-30 NOTE — Telephone Encounter (Signed)
Pt came by the office and said she has not had a BM since Friday. She had upset stomach on Thursday and loose stools and took one dose of liquid Immodium because she had an appt she had to go to. She had some BM on Fri and not any since. She is not in any pain, has just a little bloating. Wants advise on what to take. She has never had miralax, because she usually does not have this problem. She takes her probiotic daily and has taken a stool softner for the last three days. Please advise!

## 2012-06-30 NOTE — Telephone Encounter (Signed)
Miralax 17gram daily for constipation as needed. She can take two doses the first day to get bowels started if needed.

## 2012-07-01 ENCOUNTER — Telehealth: Payer: Self-pay

## 2012-07-01 NOTE — Telephone Encounter (Signed)
Called and informed pt.  

## 2012-07-01 NOTE — Telephone Encounter (Signed)
She can take Miralax twice today as well. Then once daily. If still no BM, try glycerine suppository X 1.

## 2012-07-01 NOTE — Telephone Encounter (Signed)
Pt came by the office and said she took the Miralax twice yesterday and she has still not had any results. She is not having any pain, no nausea or vomiting and feels OK. Said she is expelling gas and burping some. I told her to take the Miralax this AM, since Tana Coast, PA said she can take qd as needed. She said she would and would let me know how she does. Any other recommendations?

## 2012-07-02 ENCOUNTER — Telehealth: Payer: Self-pay

## 2012-07-02 NOTE — Telephone Encounter (Signed)
Pt called and said she still has not had a BM. She took the Miralax bid yesterday. She did not use the glycerine suppository yet. I asked her to do that and to let us know what happens. She said she is not having any pain, she is just worried because she is not having a BM.

## 2012-07-02 NOTE — Telephone Encounter (Signed)
I spoke to Deborah Farrell. She said since pt does not have a hx of constipation, she can just take Miralax at bedtime as needed. If she does not have a BM during the day, take a dose at bedtime. I called and told pt and she expresses understanding.

## 2012-07-02 NOTE — Telephone Encounter (Signed)
Pt called back. She had taken the Miralax this AM and then did the Glycerine suppository. About 30 min after the suppository, she had a good BM. She is just so glad she had results. I told her I will let her know what Deborah Farrell recommends now.

## 2012-08-25 ENCOUNTER — Encounter: Payer: Self-pay | Admitting: Gastroenterology

## 2012-08-26 ENCOUNTER — Encounter: Payer: Self-pay | Admitting: Gastroenterology

## 2012-08-26 ENCOUNTER — Ambulatory Visit (INDEPENDENT_AMBULATORY_CARE_PROVIDER_SITE_OTHER): Payer: Medicare Other | Admitting: Gastroenterology

## 2012-08-26 VITALS — BP 108/61 | HR 57 | Temp 98.4°F | Ht 62.0 in | Wt 161.0 lb

## 2012-08-26 DIAGNOSIS — K219 Gastro-esophageal reflux disease without esophagitis: Secondary | ICD-10-CM

## 2012-08-26 DIAGNOSIS — D369 Benign neoplasm, unspecified site: Secondary | ICD-10-CM

## 2012-08-26 MED ORDER — PANTOPRAZOLE SODIUM 40 MG PO TBEC: 40.0000 mg | DELAYED_RELEASE_TABLET | Freq: Two times a day (BID) | ORAL | Status: DC

## 2012-08-26 NOTE — Progress Notes (Signed)
  Subjective:    Patient ID: Deborah Farrell, female    DOB: 08/19/1917, 79 y.o.   MRN: [id number]  [name], MD  HPI SOME MEDS CONSTIPATE HER. REALLY HAD A PROBLEM WITH VESICARE. BACK IS DOING PRETTY GOOD. NOW ON PROTONIX ONCE DAILY. HAVEN'T BEEN WORKING OUT AS MUCH. FEELING LIKE SHE'S GONNA' START SOON.  PT DENIES FEVER, CHILLS, BRBPR, nausea, vomiting, melena, diarrhea, abd pain, problems swallowing. RARE heartburn or indigestion.   Past Medical History  Diagnosis Date  . Tubulovillous adenoma 2007    with multifocal high grade dysplasia-WFBH 2007;TV ADENOMA w/o dysplasia 2008;  SIMPLE ADENOMA 2009; 4mm SIMPLE ADENOMA JAN 2011  . IBS (irritable bowel syndrome)     Non-ulcer dyspepsia; hemorrhoids  . HTN (hypertension)   . Glaucoma   . Allergy history, radiographic dye   . Urinary tract infection   . Arthritis    Past Surgical History  Procedure Laterality Date  . Appendectomy    . Vaginal hysterectomy    . Ovarian cyst removal    . Colonoscopy  2008, 2011    diverticulitis diagnosed, 1mm sessile polyp removed inascending colon. area tattood for future observation and reccomended f/u colonoscopy in one year  . Colonoscopy N/A 04/24/2012    SLF:The colon IS redundant/ Single RECTAL polyp/Moderate diverticulosis/Small internal hemorrhoids   Allergies  Allergen Reactions  . Aciphex [Rabeprazole] Other (See Comments)    intolerance  . Dicyclomine Hcl Other (See Comments)    Unknown  . Hyoscyamine Sulfate Other (See Comments)    Unknown  . Iodinated Diagnostic Agents   . Nexium [Esomeprazole Magnesium] Other (See Comments)    G.I. Upset  . Omeprazole Other (See Comments)    GI upset  . Sulfonamide Derivatives Other (See Comments)    Unknown   Current Outpatient Prescriptions  Medication Sig Dispense Refill  . acetaminophen (TYLENOL) 500 MG tablet Take 650 mg by mouth 3 (three) times daily as needed for pain.       Marland Kitchen ALPRAZolam (XANAX) 0.25 MG tablet Take 0.25 mg by  mouth at bedtime as needed for anxiety.       Marland Kitchen amLODipine-valsartan (EXFORGE) 5-320 MG per tablet Take 1 tablet by mouth daily.        . carvedilol (COREG) 12.5 MG tablet Take 1 tablet (12.5 mg total) by mouth 2 (two) times daily with a meal.    . chlorthalidone (HYGROTON) 25 MG tablet Take 12.5 mg by mouth daily.    . fish oil-omega-3 fatty acids 1000 MG capsule Take 1 g by mouth daily.      Marland Kitchen latanoprost (XALATAN) 0.005 % ophthalmic solution Place 1 drop into both eyes at bedtime.      . Multiple Vitamins-Minerals (MULTI COMPLETE PO) Take 1 tablet by mouth daily.     . pantoprazole (PROTONIX) 40 MG tablet Take 1 tablet (40 mg total) by mouth 2 (two) times daily.    . Probiotic Product (PHILLIPS COLON HEALTH PO) Take 1 capsule by mouth daily.     .            Review of Systems     Objective:   Physical Exam        Assessment & Plan:

## 2012-08-26 NOTE — Assessment & Plan Note (Signed)
NEXT TCS IN 2019 IF BENEFITS OUTWEIGH THE RISKS

## 2012-08-26 NOTE — Progress Notes (Signed)
CC'd to PCP 

## 2012-08-26 NOTE — Patient Instructions (Signed)
Keep up the rehab for your back.  CONTINUE PROTONIX.    FOLLOW UP IN 6 MOS.

## 2012-08-26 NOTE — Progress Notes (Signed)
Reminder in epic °

## 2012-08-26 NOTE — Assessment & Plan Note (Signed)
SX CONTROLLED.  CONTINUE PROTONIX. OPV IN 6 MOS

## 2012-10-12 ENCOUNTER — Other Ambulatory Visit: Payer: Self-pay | Admitting: *Deleted

## 2012-10-12 MED ORDER — CARVEDILOL 12.5 MG PO TABS: 12.5000 mg | ORAL_TABLET | Freq: Two times a day (BID) | ORAL | Status: DC

## 2012-10-16 ENCOUNTER — Other Ambulatory Visit: Payer: Self-pay | Admitting: *Deleted

## 2012-10-16 MED ORDER — CARVEDILOL 12.5 MG PO TABS: 12.5000 mg | ORAL_TABLET | Freq: Two times a day (BID) | ORAL | Status: DC

## 2012-10-27 ENCOUNTER — Telehealth: Payer: Self-pay | Admitting: Adult Health

## 2012-10-27 NOTE — Telephone Encounter (Signed)
Needs refill on Carvedilol sent to [facility] / tgs

## 2012-10-28 MED ORDER — CARVEDILOL 12.5 MG PO TABS: 12.5000 mg | ORAL_TABLET | Freq: Two times a day (BID) | ORAL | Status: DC

## 2012-10-28 NOTE — Telephone Encounter (Signed)
Medication sent via escribe.  

## 2012-11-19 ENCOUNTER — Ambulatory Visit (HOSPITAL_COMMUNITY)
Admission: RE | Admit: 2012-11-19 | Discharge: 2012-11-19 | Disposition: A | Discharge status: Home / Self Care | Payer: Medicare Other | Source: Ambulatory Visit | Attending: Internal Medicine | Admitting: Internal Medicine

## 2012-11-19 DIAGNOSIS — M539 Dorsopathy, unspecified: Secondary | ICD-10-CM | POA: Insufficient documentation

## 2012-11-19 DIAGNOSIS — M6281 Muscle weakness (generalized): Secondary | ICD-10-CM | POA: Insufficient documentation

## 2012-11-19 DIAGNOSIS — M25559 Pain in unspecified hip: Secondary | ICD-10-CM | POA: Insufficient documentation

## 2012-11-19 DIAGNOSIS — M545 Low back pain, unspecified: Secondary | ICD-10-CM | POA: Insufficient documentation

## 2012-11-19 DIAGNOSIS — R269 Unspecified abnormalities of gait and mobility: Secondary | ICD-10-CM | POA: Insufficient documentation

## 2012-11-19 DIAGNOSIS — IMO0001 Reserved for inherently not codable concepts without codable children: Secondary | ICD-10-CM | POA: Insufficient documentation

## 2012-11-19 DIAGNOSIS — M2560 Stiffness of unspecified joint, not elsewhere classified: Secondary | ICD-10-CM

## 2012-11-19 DIAGNOSIS — R2689 Other abnormalities of gait and mobility: Secondary | ICD-10-CM | POA: Insufficient documentation

## 2012-11-19 NOTE — Evaluation (Signed)
Physical Therapy Evaluation  Patient Details  Name: Deborah Farrell MRN: [id number] Date of Birth: 02-02-1917  Today's Date: 11/19/2012 Time:1304  - 1345  charge:  evaluation              Visit#: 1 of 8  Re-eval: 12/19/12 Assessment Diagnosis: low back pain  Authorization: [employer] medicare     Authorization Visit#: 1 of 10   Past Medical History:  Past Medical History  Diagnosis Date  . Tubulovillous adenoma 2007    with multifocal high grade dysplasia-WFBH 2007;TV ADENOMA w/o dysplasia 2008;  SIMPLE ADENOMA 2009; 4mm SIMPLE ADENOMA JAN 2011  . IBS (irritable bowel syndrome)     Non-ulcer dyspepsia; hemorrhoids  . HTN (hypertension)   . Glaucoma   . Allergy history, radiographic dye   . Urinary tract infection   . Arthritis    Past Surgical History:  Past Surgical History  Procedure Laterality Date  . Appendectomy    . Vaginal hysterectomy    . Ovarian cyst removal    . Colonoscopy  2008, 2011    diverticulitis diagnosed, 1mm sessile polyp removed inascending colon. area tattood for future observation and reccomended f/u colonoscopy in one year  . Colonoscopy N/A 04/24/2012    SLF:The colon IS redundant/ Single RECTAL polyp/Moderate diverticulosis/Small internal hemorrhoids    Subjective Symptoms/Limitations Symptoms: Pt states that her back has bothered her for many years.  She statesl the pain is mainly on her right side but denies radiculopathy.  She has had three injections with minimal improvement How long can you sit comfortably?: 30 minutes How long can you stand comfortably?: 30 minutes  How long can you walk comfortably?: walks with a cane now; pt use to walk all the time now only walks 10 minutes Pain Assessment Currently in Pain?: Yes Pain Location: Back Pain Orientation: Right Pain Type: Chronic pain Pain Onset: More than a month ago Pain Frequency: Constant    Balance Screening Balance Screen Has the patient fallen in the past 6 months: Yes Has the  patient had a decrease in activity level because of a fear of falling? : Yes Is the patient reluctant to leave their home because of a fear of falling? : No     Sensation/Coordination/Flexibility/Functional Tests Functional Tests Functional Tests: foto  FS 50 adusted 48  Assessment RLE Strength Right Hip Flexion: 5/5 Right Hip Extension: 3/5 Right Hip ABduction: 5/5 Right Hip ADduction: 5/5 Right Knee Flexion: 4/5 Right Knee Extension: 5/5 Right Ankle Dorsiflexion: 5/5 LLE Strength Left Hip Flexion: 5/5 Left Hip Extension: 3/5 Left Hip ABduction: 5/5 Left Hip ADduction: 5/5 Left Knee Flexion: 4/5 Left Knee Extension: 5/5 Left Ankle Dorsiflexion: 5/5 Lumbar AROM Lumbar Flexion: decreased 30% Lumbar Extension: decreased 100% Lumbar - Right Side Bend: decreased 50% Lumbar - Left Side Bend: decreased 50% Lumbar - Right Rotation: decrased 70% Lumbar - Left Rotation: decreased 90%  Exercise/Treatments Mobility/Balance  Posture/Postural Control Posture/Postural Control: Postural limitations Postural Limitations: forward bent 30 degrees when standing and walking; forward head, flat back Static Standing Balance Single Leg Stance - Right Leg: 0 Single Leg Stance - Left Leg: 0   Stretches Active Hamstring Stretch: 2 reps;30 seconds Single Knee to Chest Stretch: 2 reps;30 seconds Lower Trunk Rotation: 5 reps Standing Extension: 5 reps   Standing Other Standing Lumbar Exercises: SLS x 3 Seated Other Seated Lumbar Exercises: scapular retraction    Physical Therapy Assessment and Plan PT Assessment and Plan Clinical Impression Statement: Pt with poor posture, back pain and  decreased balance referred to improve back pain and balance.  Pt will benefit from skilled PT to increase postural awareness and strength as weill as improve balance to decrease fall risk. Pt will benefit from skilled therapeutic intervention in order to improve on the following deficits: Decreased  activity tolerance;Decreased balance;Decreased range of motion;Decreased strength;Difficulty walking;Pain;Increased fascial restricitons;Improper spinal/pelvic alignment;Impaired flexibility Rehab Potential: Good PT Frequency: Min 2X/week PT Duration: 4 weeks PT Treatment/Interventions: Therapeutic activities;Therapeutic exercise;Manual techniques;Patient/family education PT Plan: Pt to be seen for balance and low back exercises.  Pt to begin t-band and decompression exercises as well as tandem stance progress towards tandem and retro wallking when pt can do so in an upright position.    Goals Home Exercise Program Pt/caregiver will Perform Home Exercise Program: For increased ROM PT Short Term Goals Time to Complete Short Term Goals: 2 weeks PT Short Term Goal 1: Pt to have a 15 degree bend when standing to decrease pressure on low back  PT Short Term Goal 2: PT to be able to SLS for 5 sec to redcuce risk of falling PT Long Term Goals Time to Complete Long Term Goals: 4 weeks PT Long Term Goal 1: I in advance HEP PT Long Term Goal 2: Pt to be able to stand erect when standing and walking Long Term Goal 3: Pt to be able to walk in her home without her cane Long Term Goal 4: Pt to be able to complete housework  PT Long Term Goal 5: Pt to be able to sls for 10 seconds.   Problem List Patient Active Problem List   Diagnosis Date Noted  . Stiffness of joints, not elsewhere classified, multiple sites 11/19/2012  . Poor balance 11/19/2012  . Hip pain, right 11/20/2011  . Back pain, lumbosacral 11/20/2011  . Abdominal bloating 07/19/2011  . Diarrhea 07/19/2011  . Tubulovillous adenoma   . HYPERLIPIDEMIA 08/20/2007  . GLAUCOMA 08/20/2007  . HYPERTENSION 08/20/2007  . Gastroesophageal reflux disease 08/20/2007  . IRRITABLE BOWEL SYNDROME 08/20/2007    PT - End of Session Equipment Utilized During Treatment: Gait belt PT Plan of Care PT Home Exercise Plan: given  GP Functional  Assessment Tool Used: foto Functional Limitation: Other PT primary Other PT Primary Current Status (R6045): At least 40 percent but less than 60 percent impaired, limited or restricted Other PT Primary Goal Status (W0981): At least 20 percent but less than 40 percent impaired, limited or restricted  RUSSELL,CINDY 11/19/2012, 3:19 PM  Physician Documentation Your signature is required to indicate approval of the treatment plan as stated above.  Please sign and either send electronically or make a copy of this report for your files and return this physician signed original.   Please mark one 1.__approve of plan  2. ___approve of plan with the following conditions.   ______________________________                                                          _____________________ Physician Signature  Date  

## 2012-11-23 ENCOUNTER — Telehealth (HOSPITAL_COMMUNITY): Payer: Self-pay

## 2012-11-24 ENCOUNTER — Ambulatory Visit (HOSPITAL_COMMUNITY): Payer: Self-pay | Admitting: *Deleted

## 2012-11-26 ENCOUNTER — Ambulatory Visit (HOSPITAL_COMMUNITY): Payer: Self-pay

## 2012-12-01 ENCOUNTER — Ambulatory Visit (HOSPITAL_COMMUNITY): Payer: Self-pay | Admitting: Physical Therapy

## 2012-12-03 ENCOUNTER — Ambulatory Visit (HOSPITAL_COMMUNITY): Payer: Self-pay | Admitting: Physical Therapy

## 2012-12-08 ENCOUNTER — Ambulatory Visit (HOSPITAL_COMMUNITY): Payer: Self-pay | Admitting: *Deleted

## 2012-12-15 ENCOUNTER — Ambulatory Visit (HOSPITAL_COMMUNITY): Payer: Self-pay | Admitting: *Deleted

## 2012-12-17 ENCOUNTER — Ambulatory Visit (HOSPITAL_COMMUNITY): Payer: Self-pay | Admitting: *Deleted

## 2013-02-04 ENCOUNTER — Telehealth: Payer: Self-pay | Admitting: *Deleted

## 2013-02-04 MED ORDER — CHLORTHALIDONE 25 MG PO TABS: 12.5000 mg | ORAL_TABLET | Freq: Every day | ORAL | Status: DC

## 2013-02-04 NOTE — Telephone Encounter (Signed)
Medication sent via escribe.  

## 2013-02-04 NOTE — Telephone Encounter (Signed)
Pt needs chlorthaladone called in to [facility]

## 2013-03-19 ENCOUNTER — Other Ambulatory Visit (HOSPITAL_COMMUNITY): Payer: Self-pay | Admitting: Internal Medicine

## 2013-03-19 DIAGNOSIS — M8580 Other specified disorders of bone density and structure, unspecified site: Secondary | ICD-10-CM

## 2013-03-25 ENCOUNTER — Ambulatory Visit (HOSPITAL_COMMUNITY)
Admission: RE | Admit: 2013-03-25 | Discharge: 2013-03-25 | Disposition: A | Discharge status: Home / Self Care | Payer: Medicare Other | Source: Ambulatory Visit | Attending: Internal Medicine | Admitting: Internal Medicine

## 2013-03-25 DIAGNOSIS — M8580 Other specified disorders of bone density and structure, unspecified site: Secondary | ICD-10-CM

## 2013-03-25 DIAGNOSIS — Z13820 Encounter for screening for osteoporosis: Secondary | ICD-10-CM | POA: Insufficient documentation

## 2013-04-01 ENCOUNTER — Telehealth: Payer: Self-pay | Admitting: *Deleted

## 2013-04-01 NOTE — Telephone Encounter (Signed)
Called and informed pt. Routing to Manuela Schwartz to make the appt.

## 2013-04-01 NOTE — Telephone Encounter (Signed)
PLEASE CALL PT. SHE MAY COME TO OFC FOR OPV MAR 26 AT 1130.

## 2013-04-01 NOTE — Telephone Encounter (Signed)
I called pt. She said she has been doing well except having a lot of gas. She has not made any changes in her diet. She has a BM once or twice a day. Occassionally a little abdominal pain. She made an appt with Dr. Oneida Alar for 05/06/2013 at 8:30 AM. She would just like to know if Dr. Oneida Alar can made any recommendation before her appt. Please advise!

## 2013-04-01 NOTE — Telephone Encounter (Signed)
Pt called stating she needs a appointment I made her a appointment for 05/06/13 at 8:30 with SF, pt requested SF, pt said she is having a lot of trouble with her stomach pt said she feels like she is having a lot of gas, pt is going to the bathroom okay pt said her stomach growls and it does not feel good at times, pt said she is not sure if she can wait that long for her appointment Please advise [phone]

## 2013-04-01 NOTE — Telephone Encounter (Signed)
Pt is aware of OV for 3/26 at 1130 with [facility]

## 2013-04-08 ENCOUNTER — Encounter (INDEPENDENT_AMBULATORY_CARE_PROVIDER_SITE_OTHER): Payer: Self-pay

## 2013-04-08 ENCOUNTER — Ambulatory Visit (INDEPENDENT_AMBULATORY_CARE_PROVIDER_SITE_OTHER): Payer: Medicare Other | Admitting: Gastroenterology

## 2013-04-08 ENCOUNTER — Encounter: Payer: Self-pay | Admitting: Gastroenterology

## 2013-04-08 VITALS — BP 103/57 | HR 64 | Temp 97.9°F | Ht 65.0 in | Wt 154.4 lb

## 2013-04-08 DIAGNOSIS — K648 Other hemorrhoids: Secondary | ICD-10-CM

## 2013-04-08 NOTE — Progress Notes (Signed)
Pt is aware of banding on 4/9 at 1130 with SF and appt card was mailed

## 2013-04-08 NOTE — Assessment & Plan Note (Signed)
L LAT BAND APR 9 DRINK WATER ADD FIBER

## 2013-04-08 NOTE — Patient Instructions (Addendum)
DRINK WATER TO KEEP YOUR URINE LIGHT YELLOW.   FOLLOW A HIGH FIBER DIET. SEE INFO BELOW.  USE FIBER POWDER OR 1 PACKET ONCE DAILY FOR 3 DAYS THEN TWICE DAILY FOR 3 DAYS THEN THREE TIMES A DAY. AVOID HIGHER DOSES IF IT CAUSES BLOATING & GAS.  CONTINUE PROBIOTIC.  FOLLOW UP APR 9 FOR ADDITIONAL BANDING AT 1130.  High-Fiber Diet A high-fiber diet changes your normal diet to include more whole grains, legumes, fruits, and vegetables. Changes in the diet involve replacing refined carbohydrates with unrefined foods. The calorie level of the diet is essentially unchanged. The Dietary Reference Intake (recommended amount) for adult males is 38 grams per day. For adult females, it is 25 grams per day. Pregnant and lactating women should consume 28 grams of fiber per day. Fiber is the intact part of a plant that is not broken down during digestion. Functional fiber is fiber that has been isolated from the plant to provide a beneficial effect in the body. PURPOSE  Increase stool bulk.   Ease and regulate bowel movements.   Lower cholesterol.  INDICATIONS THAT YOU NEED MORE FIBER  Constipation and hemorrhoids.   Uncomplicated diverticulosis (intestine condition) and irritable bowel syndrome.   Weight management.   As a protective measure against hardening of the arteries (atherosclerosis), diabetes, and cancer.   GUIDELINES FOR INCREASING FIBER IN THE DIET  Start adding fiber to the diet slowly. A gradual increase of about 5 more grams (2 slices of whole-wheat bread, 2 servings of most fruits or vegetables, or 1 bowl of high-fiber cereal) per day is best. Too rapid an increase in fiber may result in constipation, flatulence, and bloating.   Drink enough water and fluids to keep your urine clear or pale yellow. Water, juice, or caffeine-free drinks are recommended. Not drinking enough fluid may cause constipation.   Eat a variety of high-fiber foods rather than one type of fiber.   Try to  increase your intake of fiber through using high-fiber foods rather than fiber pills or supplements that contain small amounts of fiber.   The goal is to change the types of food eaten. Do not supplement your present diet with high-fiber foods, but replace foods in your present diet.  INCLUDE A VARIETY OF FIBER SOURCES  Replace refined and processed grains with whole grains, canned fruits with fresh fruits, and incorporate other fiber sources. White rice, white breads, and most bakery goods contain little or no fiber.   Brown whole-grain rice, buckwheat oats, and many fruits and vegetables are all good sources of fiber. These include: broccoli, Brussels sprouts, cabbage, cauliflower, beets, sweet potatoes, white potatoes (skin on), carrots, tomatoes, eggplant, squash, berries, fresh fruits, and dried fruits.   Cereals appear to be the richest source of fiber. Cereal fiber is found in whole grains and bran. Bran is the fiber-rich outer coat of cereal grain, which is largely removed in refining. In whole-grain cereals, the bran remains. In breakfast cereals, the largest amount of fiber is found in those with "bran" in their names. The fiber content is sometimes indicated on the label.   You may need to include additional fruits and vegetables each day.   In baking, for 1 cup white flour, you may use the following substitutions:   1 cup whole-wheat flour minus 2 tablespoons.   1/2 cup white flour plus 1/2 cup whole-wheat flour.

## 2013-04-08 NOTE — Progress Notes (Signed)
  PROCEDURE TECHNIQUE: BENEFITS RISK EXPLAINED TO PT. ANOSCOPY PERFORMED. BULGING INTERNAL HEMORRHOID COLUMN IN THE R POSTERIOR AND ANTERIOR COLUMNS. ONE CRH BAND PLACED IN RIGHT POSTERIOR POSITION. POST-BANDING RECTAL EXAM REVEALED GOOD PLACEMENT. EXAM NON-TENDER.  

## 2013-04-08 NOTE — Progress Notes (Signed)
Subjective:    Patient ID: Deborah Farrell, female    DOB: [birth year], 80 y.o.   MRN: [id number]  Deborah Cahill, MD  HPI Ate spicy spaghetti 1 mo ago. Has soft stool & finishes, but may be walking and some leaks out. IT'S ON HER UNDERCLOTHES ON A PAD. HAPPENS OFF AND ON FOR A HALF HOUR AND THEN IT RESOLVES UNTIL THE NEXT TIME. MILD RECTAL ITCHING. HAD GAS PRETTY BAD. MAY HAVE RECTAL PRESSURE BUT NO RECTAL PAIN/BLEEDING. PAIN IN R SI JOINT. THINKS CAN'T SEE CHIRP RACTOR DUE TO AGE.   Past Medical History  Diagnosis Date  . Tubulovillous adenoma 2007    with multifocal high grade dysplasia-WFBH 2007;TV ADENOMA w/o dysplasia 2008;  SIMPLE ADENOMA 2009; 35mm SIMPLE ADENOMA JAN 2011  . IBS (irritable bowel syndrome)     Non-ulcer dyspepsia; hemorrhoids  . HTN (hypertension)   . Glaucoma   . Allergy history, radiographic dye   . Urinary tract infection   . Arthritis     Past Surgical History  Procedure Laterality Date  . Appendectomy    . Vaginal hysterectomy    . Ovarian cyst removal    . Colonoscopy  2008, 2011    diverticulitis diagnosed, 62mm sessile polyp removed inascending colon. area tattood for future observation and reccomended f/u colonoscopy in one year  . Colonoscopy N/A 04/24/2012    SLF:The colon IS redundant/ Single RECTAL polyp/Moderate diverticulosis/Small internal hemorrhoids   Allergies  Allergen Reactions  . Aciphex [Rabeprazole] Other (See Comments)    intolerance  . Dicyclomine Hcl Other (See Comments)    Unknown  . Hyoscyamine Sulfate Other (See Comments)    Unknown  . Iodinated Diagnostic Agents   . Nexium [Esomeprazole Magnesium] Other (See Comments)    G.I. Upset  . Omeprazole Other (See Comments)    GI upset  . Sulfonamide Derivatives Other (See Comments)    Unknown    Current Outpatient Prescriptions  Medication Sig Dispense Refill  . amLODipine-valsartan (EXFORGE) 5-320 MG per tablet Take 0.5 tablets by mouth daily.       . carvedilol (COREG)  12.5 MG tablet Take 1 tablet (12.5 mg total) by mouth 2 (two) times daily with a meal.  180 tablet  3  . chlorthalidone (HYGROTON) 25 MG tablet Take 12.5 mg by mouth every other day.      Marland Kitchen ESTRACE VAGINAL 0.1 MG/GM vaginal cream Place 1 Applicatorful vaginally daily.       . fish oil-omega-3 fatty acids 1000 MG capsule Take 1 g by mouth 2 (two) times daily.       Marland Kitchen latanoprost (XALATAN) 0.005 % ophthalmic solution Place 1 drop into both eyes at bedtime.        . Multiple Vitamins-Minerals (MULTI COMPLETE PO) Take 1 tablet by mouth daily.       . pantoprazole (PROTONIX) 40 MG tablet Take 1 tablet (40 mg total) by mouth 2 (two) times daily.  180 tablet  3  . Probiotic Product ([place] [facility]ON HEALTH PO) Take 1 capsule by mouth daily.       Marland Kitchen acetaminophen (TYLENOL) 500 MG tablet Take 650 mg by mouth 3 (three) times daily as needed for pain.       Marland Kitchen ALPRAZolam (XANAX) 0.25 MG tablet Take 0.25 mg by mouth at bedtime as needed for anxiety.       . solifenacin (VESICARE) 5 MG tablet Take 5 mg by mouth daily.         No current facility-administered  medications for this visit.      Review of Systems     Objective:   Physical Exam  Vitals reviewed. Constitutional: She is oriented to person, place, and time. She appears well-nourished. No distress.  HENT:  Head: Normocephalic and atraumatic.  Mouth/Throat: No oropharyngeal exudate.  Eyes: Pupils are equal, round, and reactive to light. No scleral icterus.  Neck: Normal range of motion. Neck supple.  Cardiovascular: Normal rate, regular rhythm and normal heart sounds.   Pulmonary/Chest: Effort normal and breath sounds normal. No respiratory distress.  Abdominal: Soft. Bowel sounds are normal. She exhibits no distension.  Musculoskeletal: She exhibits no edema.  Neurological: She is alert and oriented to person, place, and time.  NO FOCAL DEFICITS  Psychiatric: She has a normal mood and affect.          Assessment & Plan:

## 2013-04-12 NOTE — Progress Notes (Signed)
Cc'd to pcp 

## 2013-04-22 ENCOUNTER — Encounter: Payer: Self-pay | Admitting: Gastroenterology

## 2013-04-22 ENCOUNTER — Ambulatory Visit (INDEPENDENT_AMBULATORY_CARE_PROVIDER_SITE_OTHER): Payer: Medicare Other | Admitting: Gastroenterology

## 2013-04-22 VITALS — BP 93/52 | HR 66 | Temp 97.4°F | Ht 63.0 in | Wt 159.2 lb

## 2013-04-22 DIAGNOSIS — K648 Other hemorrhoids: Secondary | ICD-10-CM

## 2013-04-22 MED ORDER — PANTOPRAZOLE SODIUM 40 MG PO TBEC: 40.0000 mg | DELAYED_RELEASE_TABLET | Freq: Two times a day (BID) | ORAL | Status: DC

## 2013-04-22 NOTE — Patient Instructions (Addendum)
DRINK WATER TO KEEP YOUR URINE LIGHT YELLOW.  FOLLOW A HIGH FIBER DIET.   FOLLOW UP APR 23 AT 1145 AM.

## 2013-04-22 NOTE — Assessment & Plan Note (Signed)
R ANT BAND TODAY L LAT BAND IN 2 WEEKS

## 2013-04-22 NOTE — Progress Notes (Signed)
SYMPTOMS: RECTAL ITCHING/soiling better  CONSTIPATION: yes DIARRHEA: no  STRAINS WITH BMs: NO  TIME SPENT ON TOILET: 5 MINS TISSUE POKES OUT OF RECTUM: NO FIBER SUPPLEMENTS: NO  GLASSES OF WATER/DAY: 6-8: YES   ADDITIONAL QUESTIONS:  LATEX ALLERGY: NO PREGNANT: NO ERECTILE DYSFUNCTION MEDS OR NITRATES: NO ANTICOAGULATION/ANTIPLATELET MEDS: NO DIAGNOSED WITH CROHN'S DISEASE, PROCTITIS, PORTAL HTN, OR ANAL/RECTAL CA: NO TAKING IMMUNOSUPPRESSANTS/XRT: NO  Plan 1. R ANT CRH band TODAY.   PROCEDURE TECHNIQUE: BENEFITS RISK EXPLAINED TO PT.  ONE CRH BAND PLACED IN RIGHT ANTERIOR POSITION. POST-BANDING RECTAL EXAM REVEALED GOOD PLACEMENT. EXAM NON-TENDER.

## 2013-05-06 ENCOUNTER — Ambulatory Visit (INDEPENDENT_AMBULATORY_CARE_PROVIDER_SITE_OTHER): Payer: Medicare Other | Admitting: Gastroenterology

## 2013-05-06 ENCOUNTER — Ambulatory Visit: Payer: Medicare Other | Admitting: Gastroenterology

## 2013-05-06 VITALS — BP 128/62 | HR 60 | Temp 97.0°F | Ht 65.0 in | Wt 157.4 lb

## 2013-05-06 DIAGNOSIS — K648 Other hemorrhoids: Secondary | ICD-10-CM

## 2013-05-06 NOTE — Progress Notes (Signed)
E30 VISIT IN 2 MOS DX: BANDING FOLLOW UP

## 2013-05-06 NOTE — Progress Notes (Addendum)
SYMPTOMS: SOILING IS BETER. Sx better: RECTAL BLEEDING, PRESSURE, ITCHING, BURNING.  CONSTIPATION: NO DIARRHEA: YES  STRAINS WITH BMs: YES  TIME SPENT ON TOILET: 15 MINS TISSUE POKES OUT OF RECTUM: YES- GOES BACK BY ITSELF FIBER SUPPLEMENTS: NO  GLASSES OF WATER/DAY: 6-8   ADDITIONAL QUESTIONS:  LATEX ALLERGY: NO PREGNANT: NO ERECTILE DYSFUNCTION MEDS OR NITRATES: NO ANTICOAGULATION/ANTIPLATELET MEDS: NO DIAGNOSED WITH CROHN'S DISEASE, PROCTITIS, PORTAL HTN, OR ANAL/RECTAL CA: NO TAKING IMMUNOSUPPRESSANTS/XRT: NO  PLAN: 1. CRH BANDING TODAY  PROCEDURE TECHNIQUE: BENEFITS RISK EXPLAINED TO PT. ONE CRH BAND PLACED IN LEFT LATERAL POSITION. POST-BANDING RECTAL EXAM REVEALED GOOD PLACEMENT. EXAM NON-TENDER.

## 2013-05-06 NOTE — Progress Notes (Signed)
Reminder in EPIC 

## 2013-05-06 NOTE — Patient Instructions (Signed)
CALL IN ONE MONTH IF YOU SEE RECTAL BLEEDING OR HAVE PROBLEMS WITH SOILING.  DRINK WATER TO KEEP YOUR URINE LIGHT YELLOW.  FOLLOW A HIGH FIBER DIET.   FOLLOW UP IN 2 MOS.

## 2013-05-06 NOTE — Assessment & Plan Note (Signed)
SX IMPROVED.  L LAT BAND TODAY CALL IN ONE MO IF HER SYMPTOMS AE NOT RESOLVED . SHE MAY NEED ANOTHER BAND OR TO CONSIDER SOLESTA. OPV IN 2 MOS

## 2013-05-07 ENCOUNTER — Ambulatory Visit: Payer: Medicare Other | Admitting: Gastroenterology

## 2013-06-09 ENCOUNTER — Telehealth: Payer: Self-pay | Admitting: Gastroenterology

## 2013-06-09 MED ORDER — PANTOPRAZOLE SODIUM 40 MG PO TBEC: 40.0000 mg | DELAYED_RELEASE_TABLET | Freq: Two times a day (BID) | ORAL | Status: DC

## 2013-06-09 NOTE — Telephone Encounter (Signed)
Called and informed pt.  

## 2013-06-09 NOTE — Addendum Note (Signed)
Addended by: Danie Binder on: 06/09/2013 10:51 AM   Modules accepted: Orders

## 2013-06-09 NOTE — Telephone Encounter (Signed)
Pt needs a refill on her Protonix 40mg  sent to [facility].

## 2013-06-09 NOTE — Telephone Encounter (Signed)
Routing to Dr. Fields.  

## 2013-06-09 NOTE — Telephone Encounter (Signed)
PLEASE CALL PT.  Rx sent.  

## 2013-06-10 ENCOUNTER — Telehealth: Payer: Self-pay | Admitting: *Deleted

## 2013-06-10 NOTE — Telephone Encounter (Signed)
Pt called back and said that her insurance would not cover the Protonix. I called the [facility] at [phone] and spoke to [facility] who said that it would require a PA for once a day or bid dosing.  She said they do not send out paper work, but to call [employer] at [phone] opt [phone] and enter ext [phone].  NOTE FOR DR. FIELDS/ Pt said she has only been taking once a day for quite sometime now. She said that it seemed to work just as well.   Routing to [place] for the PA.

## 2013-06-10 NOTE — Telephone Encounter (Signed)
Pt called with a question about her RX that was sent in yesterday. Please advise [phone]

## 2013-06-10 NOTE — Telephone Encounter (Signed)
Pt came in stating her insurance will not pay for her protonics anymore, pt needs something in place of it, pt said if she does not answer to please [facility] and she will return call.

## 2013-06-10 NOTE — Telephone Encounter (Signed)
See phone note of 06/09/2013.

## 2013-06-12 NOTE — Telephone Encounter (Signed)
PA for bid pantoprazole has been sent to her insurance.

## 2013-06-15 NOTE — Telephone Encounter (Signed)
REVIEWED.  

## 2013-06-15 NOTE — Telephone Encounter (Signed)
Pt came by the office this morning. She is aware that it is OK for her to take the Protonix bid when she needs to.   She also had a concern for Dr. Oneida Alar. She said she had some rectal swelling over the weekend , no bleeding. She got some tucks pads and the swelling seems to be better.  She just wanted to let Dr. Oneida Alar know and see if she had any recommendations.

## 2013-06-15 NOTE — Telephone Encounter (Addendum)
PLEASE CALL PT. SHE SHOULD PREPARATION H TID FOR 10 DAYS TO REDUCE RECTAL SWELLING.

## 2013-06-15 NOTE — Telephone Encounter (Signed)
LMOM to call.

## 2013-06-15 NOTE — Telephone Encounter (Signed)
Pt is aware of what [facility]F said 

## 2013-06-15 NOTE — Telephone Encounter (Signed)
REVIEWED.   PLEASE CALL PT. SHE MAY TAKE MEDS ONCE A DAY BUT IF NEEDED SHE MAY USE BID.

## 2013-06-16 NOTE — Telephone Encounter (Signed)
Pt called and said she got the Prep H. She said the instructions said to check with your doctor before using because she is on BP meds. I told her that Dr. Oneida Alar is aware of her meds, but I will let her know that she  had a concern and see if Dr. Oneida Alar has any other input.

## 2013-06-23 NOTE — Telephone Encounter (Signed)
PLEASE CALL PT. HER BP IS WELL CONTROLLED. IT IS OK FOR HER TO USE PREPARATION H.

## 2013-06-24 NOTE — Telephone Encounter (Signed)
LMOM to call.

## 2013-06-24 NOTE — Telephone Encounter (Signed)
PT returned call and was informed.  

## 2013-06-28 ENCOUNTER — Encounter: Payer: Self-pay | Admitting: *Deleted

## 2013-07-07 ENCOUNTER — Encounter: Payer: Self-pay | Admitting: Gastroenterology

## 2013-07-08 ENCOUNTER — Ambulatory Visit (INDEPENDENT_AMBULATORY_CARE_PROVIDER_SITE_OTHER): Payer: Medicare Other | Admitting: Obstetrics and Gynecology

## 2013-07-08 ENCOUNTER — Encounter: Payer: Self-pay | Admitting: Obstetrics and Gynecology

## 2013-07-08 VITALS — BP 138/72 | Ht 65.0 in | Wt 159.5 lb

## 2013-07-08 DIAGNOSIS — N3090 Cystitis, unspecified without hematuria: Secondary | ICD-10-CM | POA: Diagnosis not present

## 2013-07-08 DIAGNOSIS — N952 Postmenopausal atrophic vaginitis: Secondary | ICD-10-CM

## 2013-07-08 NOTE — Progress Notes (Signed)
This chart was scribed by Ludger Nutting, Medical Scribe, for Dr. Mallory Shirk on 07/08/13 at 11:36 AM. This chart was reviewed by Dr. Mallory Shirk for accuracy.   [facility] Visit  Patient name: Deborah Farrell MRN [id number]  Date of birth: 1917-06-04  CC & HPI:  Deborah Farrell is a 80 y.o. female presenting today for discussion of Estrace. Patient is normally seen by Dr. Michela Pitcher who started patient on vaginal Estrace. She has been taking this for a few months but is concerned about side effects. She states the last use was last night.   She reports a history of a hysterectomy due to a "tumor" but is unsure since it was performed many decades ago.     ROS:  Negative unless otherwise noted in the HPI.  Pertinent History Reviewed:   Reviewed: Significant for  Medical           Past Medical History  Diagnosis Date  . Tubulovillous adenoma 2007    with multifocal high grade dysplasia-WFBH 2007;TV ADENOMA w/o dysplasia 2008;  SIMPLE ADENOMA 2009; 65mm SIMPLE ADENOMA JAN 2011  . IBS (irritable bowel syndrome)     Non-ulcer dyspepsia; hemorrhoids  . HTN (hypertension)   . Glaucoma   . Allergy history, radiographic dye   . Urinary tract infection   . Arthritis   . Dysuria   . Overactive bladder   . Urinary frequency                             Surgical Hx:    Past Surgical History  Procedure Laterality Date  . Appendectomy    . Vaginal hysterectomy    . Ovarian cyst removal    . Colonoscopy  2008, 2011    diverticulitis diagnosed, 52mm sessile polyp removed inascending colon. area tattood for future observation and reccomended f/u colonoscopy in one year  . Colonoscopy N/A 04/24/2012    SLF:The colon IS redundant/ Single RECTAL polyp/Moderate diverticulosis/Small internal hemorrhoids   Medications: Reviewed & Updated - see associated section Social History: Reviewed -  reports that she has never smoked. She has never used smokeless tobacco.  Objective Findings:  Vitals:  Blood pressure 138/72, height 5\' 5"  (1.651 m), weight 159 lb 8 oz (72.349 kg).  Physical Examination: General appearance - alert, well appearing, and in no distress and oriented to person, place, and time Mental status - alert, oriented to person, place, and time Pelvic -  VULVA: normal appearing vulva with no masses, tenderness or lesions,  VAGINA: normal appearing vagina with normal color and discharge, no lesions, well-supported-excellent vault support, good anterior and posterior support,  CERVIX: surgically absent,  UTERUS: surgically absent, vaginal cuff well healed,  ADNEXA: normal adnexa in size, nontender and no masses   Assessment & Plan:   A: 1. Recurrent cystitis 2. Atrophic vaginitis, resolved  P: 1. Continue estrace, reduce to  0.5 g three time per week.  2. Follow up PRN.

## 2013-07-13 ENCOUNTER — Telehealth: Payer: Self-pay | Admitting: *Deleted

## 2013-07-13 NOTE — Telephone Encounter (Signed)
Pt called and as a appt in august for a follow up with hemorrhoids, pt says she still feels like she has them and don't want to wait until the aug appt. Please advise

## 2013-07-14 NOTE — Telephone Encounter (Signed)
I called pt. She has used Prep H but it has not helped much. She said she can feel the hemorrhoid. No bleeding. She has appt in August and would like earlier appt. I told her Dr. Oneida Alar is out until next week and I will check with her and see when she wants her to come in. I should know something to tell her by the middle of next week. I asked her to call back if her problem worsens and we could let her see one of the extenders.

## 2013-08-11 NOTE — Telephone Encounter (Signed)
Called and informed pt. She will come in next week and routing to [place] to put on the schedule.

## 2013-08-11 NOTE — Telephone Encounter (Signed)
I have put patient on the schedule and have cancelled the other OV

## 2013-08-11 NOTE — Telephone Encounter (Signed)
PLEASE CALL PT. She can come next week AUG 6 AT 1115 IF SHE WOULD LIKE TO BE SEEN BEFORE AUG 12.

## 2013-08-12 DIAGNOSIS — Z87448 Personal history of other diseases of urinary system: Secondary | ICD-10-CM | POA: Insufficient documentation

## 2013-08-12 DIAGNOSIS — S43499A Other sprain of unspecified shoulder joint, initial encounter: Secondary | ICD-10-CM | POA: Insufficient documentation

## 2013-08-12 DIAGNOSIS — H409 Unspecified glaucoma: Secondary | ICD-10-CM | POA: Diagnosis not present

## 2013-08-12 DIAGNOSIS — I10 Essential (primary) hypertension: Secondary | ICD-10-CM | POA: Insufficient documentation

## 2013-08-12 DIAGNOSIS — S46909A Unspecified injury of unspecified muscle, fascia and tendon at shoulder and upper arm level, unspecified arm, initial encounter: Secondary | ICD-10-CM | POA: Diagnosis present

## 2013-08-12 DIAGNOSIS — S46819A Strain of other muscles, fascia and tendons at shoulder and upper arm level, unspecified arm, initial encounter: Secondary | ICD-10-CM | POA: Diagnosis not present

## 2013-08-12 DIAGNOSIS — Z87440 Personal history of urinary (tract) infections: Secondary | ICD-10-CM | POA: Diagnosis not present

## 2013-08-12 DIAGNOSIS — Z79899 Other long term (current) drug therapy: Secondary | ICD-10-CM | POA: Insufficient documentation

## 2013-08-12 DIAGNOSIS — X500XXA Overexertion from strenuous movement or load, initial encounter: Secondary | ICD-10-CM | POA: Insufficient documentation

## 2013-08-12 DIAGNOSIS — S4980XA Other specified injuries of shoulder and upper arm, unspecified arm, initial encounter: Secondary | ICD-10-CM | POA: Diagnosis present

## 2013-08-12 DIAGNOSIS — Z8719 Personal history of other diseases of the digestive system: Secondary | ICD-10-CM | POA: Diagnosis not present

## 2013-08-12 DIAGNOSIS — M129 Arthropathy, unspecified: Secondary | ICD-10-CM | POA: Diagnosis not present

## 2013-08-12 DIAGNOSIS — Y929 Unspecified place or not applicable: Secondary | ICD-10-CM | POA: Insufficient documentation

## 2013-08-12 DIAGNOSIS — Z87898 Personal history of other specified conditions: Secondary | ICD-10-CM | POA: Insufficient documentation

## 2013-08-12 DIAGNOSIS — Y9389 Activity, other specified: Secondary | ICD-10-CM | POA: Insufficient documentation

## 2013-08-13 ENCOUNTER — Emergency Department (HOSPITAL_COMMUNITY)
Admission: EM | Admit: 2013-08-13 | Discharge: 2013-08-13 | Disposition: A | Discharge status: Home / Self Care | Payer: Medicare Other | Attending: Emergency Medicine | Admitting: Emergency Medicine

## 2013-08-13 ENCOUNTER — Encounter (HOSPITAL_COMMUNITY): Payer: Self-pay | Admitting: Emergency Medicine

## 2013-08-13 DIAGNOSIS — S46211A Strain of muscle, fascia and tendon of other parts of biceps, right arm, initial encounter: Secondary | ICD-10-CM

## 2013-08-13 NOTE — ED Provider Notes (Signed)
CSN: [id number]     Arrival date & time 08/12/13  2358 History  This chart was scribed for Sharyon Cable, MD by Peyton Bottoms, ED Scribe. This patient was seen in room APA18/APA18 and the patient's care was started at 12:33 AM.  Chief Complaint  Patient presents with  . Arm Injury    sitting in recliner ans pulled the lever and now has pain in right bicep.    Patient is a 80 y.o. female presenting with arm injury. The history is provided by the patient. No language interpreter was used.  Arm Injury Location:  Elbow and arm (right) Time since incident: today. Arm location:  R upper arm Elbow location:  R elbow Pain details:    Quality:  Aching   Severity:  Moderate   Onset quality:  Sudden Chronicity:  New Dislocation: no   Foreign body present:  No foreign bodies Prior injury to area:  No Relieved by:  Nothing Worsened by:  Nothing tried Ineffective treatments:  Ice   HPI Comments: Deborah Farrell is a 80 y.o. female who presents to the Emergency Department complaining of upper right arm pain that began when twisting her body in a rocking chair to pull the lever of the recliner earlier today.  Patient describes her pain as an "ache". Patient denies any associated weakness or numbness in arm.  Patient attempted to treat with ice with no improvement.  Patient has not taken any medications to relieve pain.     Patient denies recent injury to the fall. Patient denies any prior history of surgery or injury the arm.    Past Medical History  Diagnosis Date  . Tubulovillous adenoma 2007    with multifocal high grade dysplasia-WFBH 2007;TV ADENOMA w/o dysplasia 2008;  SIMPLE ADENOMA 2009; 23mm SIMPLE ADENOMA JAN 2011  . IBS (irritable bowel syndrome)     Non-ulcer dyspepsia; hemorrhoids  . HTN (hypertension)   . Glaucoma   . Allergy history, radiographic dye   . Urinary tract infection   . Arthritis   . Dysuria   . Overactive bladder   . Urinary frequency    Past Surgical  History  Procedure Laterality Date  . Appendectomy    . Vaginal hysterectomy    . Ovarian cyst removal    . Colonoscopy  2008, 2011    diverticulitis diagnosed, 56mm sessile polyp removed inascending colon. area tattood for future observation and reccomended f/u colonoscopy in one year  . Colonoscopy N/A 04/24/2012    SLF:The colon IS redundant/ Single RECTAL polyp/Moderate diverticulosis/Small internal hemorrhoids   Family History  Problem Relation Age of Onset  . Stroke Mother   . Heart attack Sister   . Heart disease Sister   . Cancer Maternal Grandfather    History  Substance Use Topics  . Smoking status: Never Smoker   . Smokeless tobacco: Never Used  . Alcohol Use: No   OB History   Grav Para Term Preterm Abortions TAB SAB Ect Mult Living                 Review of Systems  Musculoskeletal:       Right arm pain  Neurological: Negative for weakness and numbness.    Allergies  Aciphex; Dicyclomine hcl; Hyoscyamine sulfate; Iodinated diagnostic agents; Nexium; Omeprazole; and Sulfonamide derivatives  Home Medications   Prior to Admission medications   Medication Sig Start Date End Date Taking? Authorizing Provider  acetaminophen (TYLENOL) 500 MG tablet Take 650 mg by mouth  3 (three) times daily as needed for pain.    Yes Historical Provider, MD  amLODipine-valsartan (EXFORGE) 5-320 MG per tablet Take 0.5 tablets by mouth daily.    Yes Historical Provider, MD  carvedilol (COREG) 12.5 MG tablet Take 1 tablet (12.5 mg total) by mouth 2 (two) times daily with a meal. 10/28/12 10/28/13 Yes Lendon Colonel, NP  chlorthalidone (HYGROTON) 25 MG tablet Take 12.5 mg by mouth every other day. 02/04/13  Yes Lendon Colonel, NP  ESTRACE VAGINAL 0.1 MG/GM vaginal cream Place 1 Applicatorful vaginally daily.  03/22/13  Yes Historical Provider, MD  fish oil-omega-3 fatty acids 1000 MG capsule Take 1 g by mouth 2 (two) times daily.    Yes Historical Provider, MD  latanoprost  (XALATAN) 0.005 % ophthalmic solution Place 1 drop into both eyes at bedtime.     Yes Historical Provider, MD  Multiple Vitamins-Minerals (MULTI COMPLETE PO) Take 1 tablet by mouth daily.    Yes Historical Provider, MD  pantoprazole (PROTONIX) 40 MG tablet Take 1 tablet (40 mg total) by mouth 2 (two) times daily. 06/09/13  Yes Danie Binder, MD  Probiotic Product ([place] PO) Take 1 capsule by mouth daily.    Yes Historical Provider, MD  ALPRAZolam Duanne Moron) 0.25 MG tablet Take 0.25 mg by mouth at bedtime as needed for anxiety.     Historical Provider, MD  solifenacin (VESICARE) 5 MG tablet Take 5 mg by mouth daily.      Historical Provider, MD   Triage Vitals: BP 141/78  Pulse 65  Temp(Src) 98 F (36.7 C) (Oral)  Resp 20  Ht 5' (1.524 m)  Wt 158 lb (71.668 kg)  BMI 30.86 kg/m2  SpO2 96%  Physical Exam CONSTITUTIONAL: Well developed/well nourished HEAD: Normocephalic/atraumatic EYES: EOMI ENMT: Mucous membranes moist NECK: supple no meningeal signs NEURO: Pt is awake/alert, moves all extremitiesx4. Equal power noted in both upper extremities.  Equal hand grips as well as elbow flex/extension and shoulder abduction EXTREMITIES: pulses normal, full ROM, mild tenderness to right bicep, no deformity or evidence of tendon rupture, full range of motion of right elbow and right shoulder, no erythema or edema noted. SKIN: warm, color normal PSYCH: no abnormalities of mood noted  ED Course  Procedures   DIAGNOSTIC STUDIES: Oxygen Saturation is 96% on RA, normal by my interpretation.    COORDINATION OF CARE: 12:34 AM- Pt advised of plan for treatment and pt agrees.    MDM   Final diagnoses:  Strain of biceps muscle, right, initial encounter    Nursing notes including past medical history and social history reviewed and considered in documentation   I personally performed the services described in this documentation, which was scribed in my presence. The recorded  information has been reviewed and is accurate.       Sharyon Cable, MD 08/13/13 [phone]

## 2013-08-19 ENCOUNTER — Encounter: Payer: Self-pay | Admitting: Gastroenterology

## 2013-08-19 ENCOUNTER — Ambulatory Visit (INDEPENDENT_AMBULATORY_CARE_PROVIDER_SITE_OTHER): Payer: Medicare Other | Admitting: Gastroenterology

## 2013-08-19 VITALS — BP 108/67 | HR 65 | Temp 97.5°F | Resp 16 | Ht 65.0 in | Wt 158.0 lb

## 2013-08-19 DIAGNOSIS — K648 Other hemorrhoids: Secondary | ICD-10-CM

## 2013-08-19 NOTE — Assessment & Plan Note (Signed)
OPV in 2 mos Drink water Eat fiber BENFIBER BID PROBIOTIC DAILY

## 2013-08-19 NOTE — Patient Instructions (Addendum)
DRINK WATER TO KEEP YOUR URINE LIGHT YELLOW.  FOLLOW A HIGH FIBER DIET. AVOID ITEMS THAT CAUSE BLOATING & GAS.  CONTINUE PROBIOTICS AND FIBER.  PLEASE CALL WITH QUESTIONS OR CONCERNS.  FOLLOW UP IN 2 MOS.

## 2013-08-19 NOTE — Progress Notes (Signed)
PROCEDURE TECHNIQUE: BENEFITS RISK EXPLAINED TO PT.  ANOSCOPY PERFORMED. BULGE IN R POS AND LEFT LATERAL COLUMNS. ONE CRH BAND PLACED IN RIGHT POSTERIOR AND LEFT LATERAL POSITION. POST-BANDING RECTAL EXAM REVEALED GOOD PLACEMENT. EXAM NON-TENDER.

## 2013-08-19 NOTE — Progress Notes (Signed)
Cc to PCP 

## 2013-08-19 NOTE — Progress Notes (Signed)
Subjective:    Patient ID: Deborah Farrell, female    DOB: [birth year], 80 y.o.   MRN: [id number]  Deborah Cahill, MD  HPI EVERYTIME SHE HAS DIARRHEA GETS IRRITATED DOWN THERE. DOESN'T KNOW IF BOWELS ARE CLOSING LIKE THEY ARE SUPPOSE TO CLOSE. STILL LEAKING WHEN SHE HAS LIQUID>>> SOLID STOOLS. BOWELS WILL MOVE AND THEN AFTERWARDS MAY LEAK, STOPS AND THEN STOPS UNTIL THE NEXT TIME. HAPPENING AFTER EVERY BOWEL MOVEMENT.   Past Medical History  Diagnosis Date  . Tubulovillous adenoma 2007    with multifocal high grade dysplasia-[facility] 2007;TV ADENOMA w/o dysplasia 2008;  SIMPLE ADENOMA 2009; 65mm SIMPLE ADENOMA JAN 2011  . IBS (irritable bowel syndrome)     Non-ulcer dyspepsia; hemorrhoids  . HTN (hypertension)   . Glaucoma   . Allergy history, radiographic dye   . Urinary tract infection   . Arthritis   . Dysuria   . Overactive bladder   . Urinary frequency    Past Surgical History  Procedure Laterality Date  . Appendectomy    . Vaginal hysterectomy    . Ovarian cyst removal    . Colonoscopy  2008, 2011    diverticulitis diagnosed, 15mm sessile polyp removed inascending colon. area tattood for future observation and reccomended f/u colonoscopy in one year  . Colonoscopy N/A 04/24/2012    SLF:The colon IS redundant/ Single RECTAL polyp/Moderate diverticulosis/Small internal hemorrhoids    Allergies  Allergen Reactions  . Aciphex [Rabeprazole] Other (See Comments)    intolerance  . Dicyclomine Hcl Other (See Comments)    Unknown  . Hyoscyamine Sulfate Other (See Comments)    Unknown  . Iodinated Diagnostic Agents   . Nexium [Esomeprazole Magnesium] Other (See Comments)    G.I. Upset  . Omeprazole Other (See Comments)    GI upset  . Sulfonamide Derivatives Other (See Comments)    Unknown   Current Outpatient Prescriptions  Medication Sig Dispense Refill  . acetaminophen (TYLENOL) 500 MG tablet Take 650 mg by mouth 3 (three) times daily as needed for pain.       Marland Kitchen ALPRAZolam  (XANAX) 0.25 MG tablet Take 0.25 mg by mouth at bedtime as needed for anxiety.      Marland Kitchen amLODipine-valsartan (EXFORGE) 5-320 MG per tablet Take 0.5 tablets by mouth daily.     . carvedilol (COREG) 12.5 MG tablet Take 1 tablet (12.5 mg total) by mouth 2 (two) times daily with a meal.    . chlorthalidone (HYGROTON) 25 MG tablet Take 25 mg by mouth daily.    Marland Kitchen ESTRACE VAGINAL 0.1 MG/GM vaginal cream Place 1 Applicatorful vaginally daily.     . fish oil-omega-3 fatty acids 1000 MG capsule Take 1 g by mouth 2 (two) times daily.     Marland Kitchen latanoprost (XALATAN) 0.005 % ophthalmic solution Place 1 drop into both eyes at bedtime.      . Multiple Vitamins-Minerals (MULTI COMPLETE PO) Take 1 tablet by mouth daily.     . pantoprazole (PROTONIX) 40 MG tablet Take 1 tablet (40 mg total) by mouth 2 (two) times daily.    . Probiotic Product (PHILLIPS COLON HEALTH PO) Take 1 capsule by mouth daily.     . solifenacin (VESICARE) 5 MG tablet Take 5 mg by mouth daily.        No current facility-administered medications for this visit.     Review of Systems     Objective:   Physical Exam  Vitals reviewed. Constitutional: She is oriented to person, place, and time.  She appears well-developed and well-nourished. No distress.  HENT:  Head: Normocephalic and atraumatic.  Mouth/Throat: Oropharynx is clear and moist. No oropharyngeal exudate.  Eyes: Pupils are equal, round, and reactive to light. No scleral icterus.  Neck: Normal range of motion. Neck supple.  Cardiovascular: Normal rate, regular rhythm and normal heart sounds.   Pulmonary/Chest: Effort normal and breath sounds normal. No respiratory distress.  Abdominal: Soft. Bowel sounds are normal. She exhibits no distension. There is no tenderness.  Genitourinary:     Bulge in left lat/r pos area  Musculoskeletal: She exhibits no edema.  Lymphadenopathy:    She has no cervical adenopathy.  Neurological: She is alert and oriented to person, place, and time.   Psychiatric: She has a normal mood and affect.          Assessment & Plan:

## 2013-08-19 NOTE — Progress Notes (Signed)
Reminder in epic °

## 2013-08-25 ENCOUNTER — Ambulatory Visit: Payer: Medicare Other | Admitting: Gastroenterology

## 2013-09-22 ENCOUNTER — Encounter: Payer: Self-pay | Admitting: Gastroenterology

## 2013-12-23 ENCOUNTER — Telehealth: Payer: Self-pay | Admitting: *Deleted

## 2013-12-23 NOTE — Telephone Encounter (Signed)
Pt aware to come by the office and pick up the samples of Estrace. Pt stated that she would come by tomorrow to pick it up.

## 2013-12-30 ENCOUNTER — Encounter: Payer: Self-pay | Admitting: Gastroenterology

## 2013-12-30 ENCOUNTER — Ambulatory Visit (INDEPENDENT_AMBULATORY_CARE_PROVIDER_SITE_OTHER): Payer: Medicare Other | Admitting: Gastroenterology

## 2013-12-30 ENCOUNTER — Encounter (INDEPENDENT_AMBULATORY_CARE_PROVIDER_SITE_OTHER): Payer: Self-pay

## 2013-12-30 VITALS — BP 143/68 | HR 74 | Temp 98.1°F | Ht 62.0 in | Wt 156.4 lb

## 2013-12-30 DIAGNOSIS — K589 Irritable bowel syndrome without diarrhea: Secondary | ICD-10-CM

## 2013-12-30 DIAGNOSIS — K648 Other hemorrhoids: Secondary | ICD-10-CM

## 2013-12-30 NOTE — Patient Instructions (Signed)
YOU CAN CONTINUE TO USE EQUATE CREAM FOR HEMORRHOID SYMPTOMS.  ADD FIBER POWDER 1/2-1 DOSE DAILY OR EVERY OTHER DAY TO HELP WITH BOWELS.  FOLLOW UP IN 4 MOS. MERRY CHRISTMAS AND HAPPY NEW YEAR!

## 2013-12-30 NOTE — Progress Notes (Signed)
Subjective:    Patient ID: Deborah Farrell, female    DOB: 16-Jan-1917, 80 y.o.   MRN: [id number]  HPI Rare using [employer] EQUATE. BETTER THAT PREP H. SOMETIMES FEELS LIKE SHE DOESN'T COMPLETELY EMPTY HER BOWELS. WAS USING FIBER POWDER BUT THEN STOPPED. MAY FEEL NERVOUS IN HER STOMACH.   PT DENIES FEVER, CHILLS, HEMATOCHEZIA,  nausea, vomiting, OR melena, diarrhea.  Past Medical History  Diagnosis Date  . Tubulovillous adenoma 2007    with multifocal high grade dysplasia-[facility] 2007;TV ADENOMA w/o dysplasia 2008;  SIMPLE ADENOMA 2009; 33mm SIMPLE ADENOMA JAN 2011  . IBS (irritable bowel syndrome)     Non-ulcer dyspepsia; hemorrhoids  . HTN (hypertension)   . Glaucoma   . Allergy history, radiographic dye   . Urinary tract infection   . Arthritis   . Dysuria   . Overactive bladder   . Urinary frequency    Past Surgical History  Procedure Laterality Date  . Appendectomy    . Vaginal hysterectomy    . Ovarian cyst removal    . Colonoscopy  2008, 2011    diverticulitis diagnosed, 40mm sessile polyp removed inascending colon. area tattood for future observation and reccomended f/u colonoscopy in one year  . Colonoscopy N/A 04/24/2012    SLF:The colon IS redundant/ Single RECTAL polyp/Moderate diverticulosis/Small internal hemorrhoids   Allergies  Allergen Reactions  . Aciphex [Rabeprazole] Other (See Comments)    intolerance  . Dicyclomine Hcl Other (See Comments)    Unknown  . Hyoscyamine Sulfate Other (See Comments)    Unknown  . Iodinated Diagnostic Agents   . Nexium [Esomeprazole Magnesium] Other (See Comments)    G.I. Upset  . Omeprazole Other (See Comments)    GI upset  . Sulfonamide Derivatives Other (See Comments)    Unknown    Current Outpatient Prescriptions  Medication Sig Dispense Refill  . acetaminophen (TYLENOL) 500 MG tablet Take 650 mg by mouth 3 (three) times daily as needed for pain.     Marland Kitchen ALPRAZolam (XANAX) 0.25 MG tablet Take 0.25 mg by mouth at  bedtime as needed for anxiety.     Marland Kitchen amLODipine-valsartan (EXFORGE) 5-320 MG per tablet Take 0.5 tablets by mouth daily.     . chlorthalidone (HYGROTON) 25 MG tablet Take 25 mg by mouth daily.    Marland Kitchen ESTRACE VAGINAL 0.1 MG/GM vaginal cream Place 1 Applicatorful vaginally daily.     . fish oil-omega-3 fatty acids 1000 MG capsule Take 1 g by mouth 2 (two) times daily.     Marland Kitchen latanoprost (XALATAN) 0.005 % ophthalmic solution Place 1 drop into both eyes at bedtime.      . Multiple Vitamins-Minerals (MULTI COMPLETE PO) Take 1 tablet by mouth daily.     . pantoprazole (PROTONIX) 40 MG tablet Take 1 tablet (40 mg total) by mouth 2 (two) times daily.    . Probiotic Product (PHILLIPS COLON HEALTH PO) Take 1 capsule by mouth daily.     . solifenacin (VESICARE) 5 MG tablet Take 5 mg by mouth daily.      . carvedilol (COREG) 12.5 MG tablet Take 1 tablet (12.5 mg total) by mouth 2 (two) times daily with a meal.      Review of Systems     Objective:   Physical Exam  Constitutional: She is oriented to person, place, and time. She appears well-developed and well-nourished. No distress.  HENT:  Head: Normocephalic and atraumatic.  Mouth/Throat: Oropharynx is clear and moist. No oropharyngeal exudate.  Eyes: Pupils  are equal, round, and reactive to light. No scleral icterus.  Neck: Normal range of motion. Neck supple.  Cardiovascular: Normal rate, regular rhythm and normal heart sounds.   Pulmonary/Chest: Effort normal and breath sounds normal. No respiratory distress.  Abdominal: Soft. Bowel sounds are normal. She exhibits no distension. There is no tenderness.  Musculoskeletal: She exhibits no edema.  Lymphadenopathy:    She has no cervical adenopathy.  Neurological: She is alert and oriented to person, place, and time.  NO  NEW FOCAL DEFICITS   Psychiatric: She has a normal mood and affect.  Vitals reviewed.         Assessment & Plan:

## 2013-12-30 NOTE — Assessment & Plan Note (Signed)
Sx incomplete evacuation.  ADD FIBER POWDER  CALL WITH QUESTIONS OR CONCERNS IF POWDER DOESN'T WORK FOLLOW UP IN 4 MOS.

## 2013-12-30 NOTE — Assessment & Plan Note (Signed)
Sx improved. Using prn hemorrhoidal otc cream.  Hemorrhoidal cream prn CALL WITH QUESTIONS OR CONCERNS.

## 2014-01-03 NOTE — Progress Notes (Signed)
ON RECALL LIST  °

## 2014-01-13 NOTE — Progress Notes (Signed)
cc'ed to pcp °

## 2014-01-19 ENCOUNTER — Encounter: Payer: Self-pay | Admitting: Obstetrics and Gynecology

## 2014-01-19 ENCOUNTER — Ambulatory Visit (INDEPENDENT_AMBULATORY_CARE_PROVIDER_SITE_OTHER): Payer: Medicare Other | Admitting: Obstetrics and Gynecology

## 2014-01-19 VITALS — BP 120/60 | Ht 64.0 in | Wt 154.0 lb

## 2014-01-19 DIAGNOSIS — N952 Postmenopausal atrophic vaginitis: Secondary | ICD-10-CM

## 2014-01-19 NOTE — Progress Notes (Signed)
[facility] Visit  Patient name: Deborah Farrell MRN [id number]  Date of birth: [birth year]  CC & HPI:  Deborah Farrell is a 81 y.o. female presenting today for F/u on medication. She notes that she has been using the Estrace cream for awhile. She notes that she is less irritated now after using the cream. Denies any other symptoms.  ROS:  No other complaints.  Pertinent History Reviewed:   Reviewed: Significant for  Medical         Past Medical History  Diagnosis Date  . Tubulovillous adenoma 2007    with multifocal high grade dysplasia-[facility] 2007;TV ADENOMA w/o dysplasia 2008;  SIMPLE ADENOMA 2009; 67mm SIMPLE ADENOMA JAN 2011  . IBS (irritable bowel syndrome)     Non-ulcer dyspepsia; hemorrhoids  . HTN (hypertension)   . Glaucoma   . Allergy history, radiographic dye   . Urinary tract infection   . Arthritis   . Dysuria   . Overactive bladder   . Urinary frequency                               Surgical Hx:    Past Surgical History  Procedure Laterality Date  . Appendectomy    . Vaginal hysterectomy    . Ovarian cyst removal    . Colonoscopy  2008, 2011    diverticulitis diagnosed, 18mm sessile polyp removed inascending colon. area tattood for future observation and reccomended f/u colonoscopy in one year  . Colonoscopy N/A 04/24/2012    SLF:The colon IS redundant/ Single RECTAL polyp/Moderate diverticulosis/Small internal hemorrhoids   Medications: Reviewed & Updated - see associated section                      Current outpatient prescriptions: acetaminophen (TYLENOL) 500 MG tablet, Take 650 mg by mouth 3 (three) times daily as needed for pain. , Disp: , Rfl: ;  ALPRAZolam (XANAX) 0.25 MG tablet, Take 0.25 mg by mouth at bedtime as needed for anxiety. , Disp: , Rfl: ;  amLODipine-valsartan (EXFORGE) 5-320 MG per tablet, Take 0.5 tablets by mouth daily. , Disp: , Rfl:  carvedilol (COREG) 12.5 MG tablet, Take 1 tablet (12.5 mg total) by mouth 2 (two) times daily  with a meal., Disp: 180 tablet, Rfl: 3;  chlorthalidone (HYGROTON) 25 MG tablet, Take 25 mg by mouth daily., Disp: , Rfl: ;  ESTRACE VAGINAL 0.1 MG/GM vaginal cream, Place 1 Applicatorful vaginally daily. , Disp: , Rfl: ;  fish oil-omega-3 fatty acids 1000 MG capsule, Take 1 g by mouth 2 (two) times daily. , Disp: , Rfl:  latanoprost (XALATAN) 0.005 % ophthalmic solution, Place 1 drop into both eyes at bedtime.  , Disp: , Rfl: ;  Multiple Vitamins-Minerals (MULTI COMPLETE PO), Take 1 tablet by mouth daily. , Disp: , Rfl: ;  pantoprazole (PROTONIX) 40 MG tablet, Take 1 tablet (40 mg total) by mouth 2 (two) times daily., Disp: 30 tablet, Rfl: 11;  Probiotic Product ([place]), Take 1 capsule by mouth daily. , Disp: , Rfl:    Social History: Reviewed -  reports that she has never smoked. She has never used smokeless tobacco.  Objective Findings:  Vitals: Blood pressure 120/60, height 5\' 4"  (1.626 m), weight 154 lb (69.854 kg).  Physical Examination:  General appearance - alert, well appearing, and in no distress and oriented to person, place, and time Mental status -  alert, oriented to person, place, and time, normal mood, behavior, speech, dress, motor activity, and thought processes   Assessment & Plan:   A:  1. Atrophic Vaginitis responding to tx  P:  1. Use Estrace cream 1 time a week. Or less if no sx. 2. Reexamine prn.    This chart was scribed for Jonnie Kind, MD by Steva Colder, ED Scribe. The patient was seen in the office at 3:21 PM.

## 2014-01-19 NOTE — Progress Notes (Signed)
Patient ID: Deborah Farrell, female   DOB: 06/04/1917, 81 y.o.   MRN: [id number] Pt is here today to discuss medication. Pt states that she has been using Estrace cream for a while and wants to discuss this with Dr. Glo Herring.

## 2014-02-22 ENCOUNTER — Telehealth: Payer: Self-pay | Admitting: *Deleted

## 2014-02-22 MED ORDER — ESTRADIOL 0.1 MG/GM VA CREA: 1.0000 | TOPICAL_CREAM | Freq: Every day | VAGINAL | Status: DC

## 2014-02-22 NOTE — Telephone Encounter (Signed)
Spoke with pt and samples of Estrace were put at front desk for the pt. Pt to come by the office and pick up samples.

## 2014-02-28 ENCOUNTER — Emergency Department (HOSPITAL_COMMUNITY)
Admission: EM | Admit: 2014-02-28 | Discharge: 2014-02-28 | Disposition: A | Discharge status: Home / Self Care | Payer: Medicare Other | Attending: Emergency Medicine | Admitting: Emergency Medicine

## 2014-02-28 ENCOUNTER — Encounter (HOSPITAL_COMMUNITY): Payer: Self-pay | Admitting: Emergency Medicine

## 2014-02-28 DIAGNOSIS — Z79899 Other long term (current) drug therapy: Secondary | ICD-10-CM | POA: Insufficient documentation

## 2014-02-28 DIAGNOSIS — Z87448 Personal history of other diseases of urinary system: Secondary | ICD-10-CM | POA: Insufficient documentation

## 2014-02-28 DIAGNOSIS — L03116 Cellulitis of left lower limb: Secondary | ICD-10-CM | POA: Diagnosis not present

## 2014-02-28 DIAGNOSIS — H409 Unspecified glaucoma: Secondary | ICD-10-CM | POA: Insufficient documentation

## 2014-02-28 DIAGNOSIS — I10 Essential (primary) hypertension: Secondary | ICD-10-CM | POA: Diagnosis not present

## 2014-02-28 DIAGNOSIS — M1990 Unspecified osteoarthritis, unspecified site: Secondary | ICD-10-CM | POA: Diagnosis not present

## 2014-02-28 DIAGNOSIS — Z87440 Personal history of urinary (tract) infections: Secondary | ICD-10-CM | POA: Diagnosis not present

## 2014-02-28 DIAGNOSIS — Z8719 Personal history of other diseases of the digestive system: Secondary | ICD-10-CM | POA: Diagnosis not present

## 2014-02-28 DIAGNOSIS — M79672 Pain in left foot: Secondary | ICD-10-CM | POA: Diagnosis present

## 2014-02-28 MED ORDER — DOXYCYCLINE HYCLATE 100 MG PO CAPS: 100.0000 mg | ORAL_CAPSULE | Freq: Two times a day (BID) | ORAL | Status: DC

## 2014-02-28 MED ORDER — TRAMADOL HCL 50 MG PO TABS: 50.0000 mg | ORAL_TABLET | Freq: Four times a day (QID) | ORAL | Status: DC | PRN

## 2014-02-28 NOTE — Discharge Instructions (Signed)

## 2014-02-28 NOTE — ED Provider Notes (Signed)
TIME SEEN: 1:30 PM  CHIEF COMPLAINT: Left foot pain  HPI: Pt is a 81 y.o. female with history of hypertension who presents to the emergency department with left foot pain. States she went to her podiatrist on Wednesday to have her toenails clipped and since that time has had pain in the left foot. She noticed today that was red and warm. Denies any fevers or chills. She is not a diabetic. No history of injury to the foot. No history of gout. States she is otherwise feeling well. No numbness, tingling or focal weakness.  ROS: See HPI Constitutional: no fever  Eyes: no drainage  ENT: no runny nose   Cardiovascular:  no chest pain  Resp: no SOB  GI: no vomiting GU: no dysuria Integumentary: no rash  Allergy: no hives  Musculoskeletal: no leg swelling  Neurological: no slurred speech ROS otherwise negative  PAST MEDICAL HISTORY/PAST SURGICAL HISTORY:  Past Medical History  Diagnosis Date  . 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 IBS (irritable bowel syndrome)     Non-ulcer dyspepsia; hemorrhoids  . HTN (hypertension)   . Glaucoma   . Allergy history, radiographic dye   . Urinary tract infection   . Arthritis   . Dysuria   . Overactive bladder   . Urinary frequency     MEDICATIONS:  Prior to Admission medications   Medication Sig Start Date End Date Taking? Authorizing Provider  acetaminophen (TYLENOL) 500 MG tablet Take 650 mg by mouth 3 (three) times daily as needed for pain.     Historical Provider, MD  ALPRAZolam Duanne Moron) 0.25 MG tablet Take 0.25 mg by mouth at bedtime as needed for anxiety.     Historical Provider, MD  amLODipine-valsartan (EXFORGE) 5-320 MG per tablet Take 0.5 tablets by mouth daily.     Historical Provider, MD  carvedilol (COREG) 12.5 MG tablet Take 1 tablet (12.5 mg total) by mouth 2 (two) times daily with a meal. 10/28/12 01/19/14   Lendon Colonel, NP  chlorthalidone (HYGROTON) 25 MG tablet Take 25 mg by mouth daily.    Historical Provider, MD  estradiol (ESTRACE VAGINAL) 0.1 MG/GM vaginal cream Place 1 Applicatorful vaginally daily. 02/22/14   Jonnie Kind, MD  fish oil-omega-3 fatty acids 1000 MG capsule Take 1 g by mouth 2 (two) times daily.     Historical Provider, MD  latanoprost (XALATAN) 0.005 % ophthalmic solution Place 1 drop into both eyes at bedtime.      Historical Provider, MD  Multiple Vitamins-Minerals (MULTI COMPLETE PO) Take 1 tablet by mouth daily.     Historical Provider, MD  pantoprazole (PROTONIX) 40 MG tablet Take 1 tablet (40 mg total) by mouth 2 (two) times daily. 06/09/13   Danie Binder, MD  Probiotic Product (PHILLIPS COLON HEALTH PO) Take 1 capsule by mouth daily.     Historical Provider, MD    ALLERGIES:  Allergies  Allergen Reactions  . Aciphex [Rabeprazole] Other (See Comments)    intolerance  . Dicyclomine Hcl Other (See Comments)    Unknown  . Hyoscyamine Sulfate Other (See Comments)    Unknown  . Iodinated Diagnostic Agents   . Nexium [Esomeprazole Magnesium] Other (See Comments)    G.I. Upset  . Omeprazole Other (See Comments)    GI upset  . Sulfonamide Derivatives Other (See Comments)    Unknown    SOCIAL HISTORY:  History  Substance Use Topics  . Smoking status: Never Smoker   . Smokeless tobacco: Never Used  . Alcohol Use: No    FAMILY HISTORY: Family History  Problem Relation Age of Onset  . Stroke Mother   . Heart attack Sister   . Heart disease Sister   . Cancer Maternal Grandfather     EXAM: BP 109/50 mmHg  Pulse 75  Temp(Src) 97.8 F (36.6 C) (Oral)  Resp 18  Ht 5\' 4"  (1.626 m)  Wt 153 lb (69.4 kg)  BMI 26.25 kg/m2  SpO2 100% CONSTITUTIONAL: Alert and oriented and responds appropriately to questions. Well-appearing; well-nourished, appears younger than stated age, pleasant, smiling, nontoxic HEAD: Normocephalic EYES: Conjunctivae clear,  PERRL ENT: normal nose; no rhinorrhea; moist mucous membranes; pharynx without lesions noted NECK: Supple, no meningismus, no LAD  CARD: RRR; S1 and S2 appreciated; no murmurs, no clicks, no rubs, no gallops RESP: Normal chest excursion without splinting or tachypnea; breath sounds clear and equal bilaterally; no wheezes, no rhonchi, no rales,  ABD/GI: Normal bowel sounds; non-distended; soft, non-tender, no rebound, no guarding BACK:  The back appears normal and is non-tender to palpation, there is no CVA tenderness EXT: Patient has tenderness, erythema and warmth over the dorsal left foot but does not cross the MTP or ankle, no joint effusion, normal range of motion in the first toe and left ankle that is painless, Normal ROM in all joints; otherwise extremities are non-tender to palpation; no edema; normal capillary refill; no cyanosis; 2+ DP pulses bilaterally, no significant edema, normal sensation diffusely SKIN: Normal color for age and race; warm NEURO: Moves all extremities equally PSYCH: The patient's mood and manner are appropriate. Grooming and personal hygiene are appropriate.  MEDICAL DECISION MAKING: Patient here with what appears to be a very mild cellulitis of the dorsal left foot. She is otherwise hemodynamically stable without systemic symptoms. She is very well-appearing, nontoxic. Patient is not a diabetic. There is no sign of paronychia. No joint effusions to suggest septic arthritis and she has full and painless range of motion in all joints. She is neurovascularly intact distally. We'll discharge home on doxycycline and with prescription for tramadol to use as needed for pain. Discussed return precautions. Have outlined the area of erythema and discussed with patient at the erythema or warmth spreads or she has systemic symptoms she needs to return to the hospital. Patient verbalizes understanding and is comfortable with plan.    [place], DO 02/28/14 1347

## 2014-02-28 NOTE — ED Notes (Addendum)
Patient c/o left great toe pain. Per patient went to foot specialist Wednesday to have toenails clipped and since then has had "flare up of arthritis pain in toe." Denies taking any medications for pain. Per patient not diabetic. Patient also states she thinks she has a bunion on left great toe.

## 2014-03-15 ENCOUNTER — Telehealth: Payer: Self-pay | Admitting: *Deleted

## 2014-03-15 NOTE — Telephone Encounter (Signed)
CARVEDILOL 12.5 MG #90. FORM IN BIN

## 2014-03-15 NOTE — Telephone Encounter (Signed)
Refill denied,pt not seen in 2 yrs needs apt

## 2014-03-21 ENCOUNTER — Other Ambulatory Visit: Payer: Self-pay | Admitting: Cardiology

## 2014-03-21 ENCOUNTER — Other Ambulatory Visit: Payer: Self-pay | Admitting: *Deleted

## 2014-03-21 MED ORDER — CARVEDILOL 12.5 MG PO TABS: 12.5000 mg | ORAL_TABLET | Freq: Two times a day (BID) | ORAL | Status: DC

## 2014-03-21 NOTE — Telephone Encounter (Signed)
Needs Carvedilol sent to [facility] / tg

## 2014-03-21 NOTE — Telephone Encounter (Signed)
Already refilled confirmed by pharmacy

## 2014-04-05 ENCOUNTER — Encounter: Payer: Self-pay | Admitting: Gastroenterology

## 2014-04-22 ENCOUNTER — Ambulatory Visit (INDEPENDENT_AMBULATORY_CARE_PROVIDER_SITE_OTHER): Payer: Medicare Other | Admitting: Cardiology

## 2014-04-22 ENCOUNTER — Encounter: Payer: Self-pay | Admitting: Cardiology

## 2014-04-22 VITALS — BP 108/54 | HR 62 | Ht 60.0 in | Wt 147.0 lb

## 2014-04-22 DIAGNOSIS — R011 Cardiac murmur, unspecified: Secondary | ICD-10-CM

## 2014-04-22 DIAGNOSIS — Z136 Encounter for screening for cardiovascular disorders: Secondary | ICD-10-CM

## 2014-04-22 DIAGNOSIS — I10 Essential (primary) hypertension: Secondary | ICD-10-CM

## 2014-04-22 NOTE — Progress Notes (Signed)
Clinical Summary Deborah Farrell is a 81 y.o.female last seen by NP Purcell Nails, this is our first visit together. She is seen for the following medical problems.   1. HTN - checks at home once a month. Typically 130s/60s - compliant with meds   2. Hyperlipidemia - followed by pcp, not currerntly on statin      Past Medical History  Diagnosis Date  . 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 IBS (irritable bowel syndrome)     Non-ulcer dyspepsia; hemorrhoids  . HTN (hypertension)   . Glaucoma   . Allergy history, radiographic dye   . Urinary tract infection   . Arthritis   . Dysuria   . Overactive bladder   . Urinary frequency      Allergies  Allergen Reactions  . Aciphex [Rabeprazole] Other (See Comments)    intolerance  . Dicyclomine Hcl Other (See Comments)    Unknown  . Hyoscyamine Sulfate Other (See Comments)    Unknown  . Iodinated Diagnostic Agents   . Nexium [Esomeprazole Magnesium] Other (See Comments)    G.I. Upset  . Omeprazole Other (See Comments)    GI upset  . Sulfonamide Derivatives Other (See Comments)    Unknown     Current Outpatient Prescriptions  Medication Sig Dispense Refill  . acetaminophen (TYLENOL) 500 MG tablet Take 650 mg by mouth 3 (three) times daily as needed for pain.     Marland Kitchen ALPRAZolam (XANAX) 0.25 MG tablet Take 0.25 mg by mouth at bedtime as needed for anxiety.     Marland Kitchen amLODipine-valsartan (EXFORGE) 5-320 MG per tablet Take 0.5 tablets by mouth daily.     . carvedilol (COREG) 12.5 MG tablet Take 1 tablet (12.5 mg total) by mouth 2 (two) times daily with a meal. 180 tablet 0  . chlorthalidone (HYGROTON) 25 MG tablet Take 25 mg by mouth daily.    Marland Kitchen doxycycline (VIBRAMYCIN) 100 MG capsule Take 1 capsule (100 mg total) by mouth 2 (two) times daily. 14 capsule 0  . estradiol (ESTRACE VAGINAL) 0.1 MG/GM vaginal cream  Place 1 Applicatorful vaginally daily. 4 g 0  . fish oil-omega-3 fatty acids 1000 MG capsule Take 1 g by mouth 2 (two) times daily.     Marland Kitchen latanoprost (XALATAN) 0.005 % ophthalmic solution Place 1 drop into both eyes at bedtime.      . Multiple Vitamins-Minerals (MULTI COMPLETE PO) Take 1 tablet by mouth daily.     . pantoprazole (PROTONIX) 40 MG tablet Take 1 tablet (40 mg total) by mouth 2 (two) times daily. 30 tablet 11  . Probiotic Product (PHILLIPS COLON HEALTH PO) Take 1 capsule by mouth daily.     . traMADol (ULTRAM) 50 MG tablet Take 1 tablet (50 mg total) by mouth every 6 (six) hours as needed. 15 tablet 0   No current facility-administered medications for this visit.     Past Surgical History  Procedure Laterality Date  . Appendectomy    . Vaginal hysterectomy    . Ovarian cyst removal    . Colonoscopy  2008, 2011    diverticulitis diagnosed, 23mm sessile polyp removed inascending colon. area tattood for future observation and reccomended f/u colonoscopy in one year  . Colonoscopy N/A 04/24/2012    SLF:The colon IS redundant/ Single RECTAL polyp/Moderate diverticulosis/Small internal hemorrhoids     Allergies  Allergen Reactions  .  Aciphex [Rabeprazole] Other (See Comments)    intolerance  . Dicyclomine Hcl Other (See Comments)    Unknown  . Hyoscyamine Sulfate Other (See Comments)    Unknown  . Iodinated Diagnostic Agents   . Nexium [Esomeprazole Magnesium] Other (See Comments)    G.I. Upset  . Omeprazole Other (See Comments)    GI upset  . Sulfonamide Derivatives Other (See Comments)    Unknown      Family History  Problem Relation Age of Onset  . Stroke Mother   . Heart attack Sister   . Heart disease Sister   . Cancer Maternal Grandfather      Social History Deborah Farrell reports that she has never smoked. She has never used smokeless tobacco. Deborah Farrell reports that she does not drink alcohol.   Review of Systems CONSTITUTIONAL: No weight loss, fever,  chills, weakness or fatigue.  HEENT: Eyes: No visual loss, blurred vision, double vision or yellow sclerae.No hearing loss, sneezing, congestion, runny nose or sore throat.  SKIN: No rash or itching.  CARDIOVASCULAR: per HPI RESPIRATORY: No shortness of breath, cough or sputum.  GASTROINTESTINAL: No anorexia, nausea, vomiting or diarrhea. No abdominal pain or blood.  GENITOURINARY: No burning on urination, no polyuria NEUROLOGICAL: No headache, dizziness, syncope, paralysis, ataxia, numbness or tingling in the extremities. No change in bowel or bladder control.  MUSCULOSKELETAL: No muscle, back pain, joint pain or stiffness.  LYMPHATICS: No enlarged nodes. No history of splenectomy.  PSYCHIATRIC: No history of depression or anxiety.  ENDOCRINOLOGIC: No reports of sweating, cold or heat intolerance. No polyuria or polydipsia.  Marland Kitchen   Physical Examination p 62 bp 108/54 Wt 147 lbs BMI 29 Gen: resting comfortably, no acute distress HEENT: no scleral icterus, pupils equal round and reactive, no palptable cervical adenopathy,  CV: RRR, 3/6 systolic murmur RUSB,no JVD, no carotid bruits Resp: Clear to auscultation bilaterally GI: abdomen is soft, non-tender, non-distended, normal bowel sounds, no hepatosplenomegaly MSK: extremities are warm, no edema.  Skin: warm, no rash Neuro:  no focal deficits Psych: appropriate affect     Assessment and Plan  1. HTN - at goal, continue current meds  2. HL - will request most recent panel from pcp  3. Heart murmur - will obtain echo    F/u pending echo results  Arnoldo Lenis, M.D.

## 2014-04-22 NOTE — Patient Instructions (Signed)
Your physician recommends that you schedule a follow-up appointment in: to be determined after echo     Your physician has requested that you have an echocardiogram. Echocardiography is a painless test that uses sound waves to create images of your heart. It provides your doctor with information about the size and shape of your heart and how well your heart's chambers and valves are working. This procedure takes approximately one hour. There are no restrictions for this procedure.    Your physician recommends that you continue on your current medications as directed. Please refer to the Current Medication list given to you today.     Thank you for choosing [facility] !         

## 2014-04-25 ENCOUNTER — Other Ambulatory Visit: Payer: Self-pay | Admitting: Gastroenterology

## 2014-05-13 ENCOUNTER — Ambulatory Visit (HOSPITAL_COMMUNITY)
Admission: RE | Admit: 2014-05-13 | Discharge: 2014-05-13 | Disposition: A | Discharge status: Home / Self Care | Payer: Medicare Other | Source: Ambulatory Visit | Attending: Cardiology | Admitting: Cardiology

## 2014-05-13 DIAGNOSIS — R011 Cardiac murmur, unspecified: Secondary | ICD-10-CM | POA: Diagnosis not present

## 2014-05-13 NOTE — Progress Notes (Signed)
  Echocardiogram 2D Echocardiogram has been performed.  Deborah Farrell 05/13/2014, 11:56 AM

## 2014-05-18 ENCOUNTER — Telehealth: Payer: Self-pay | Admitting: Cardiology

## 2014-05-18 MED ORDER — CARVEDILOL 12.5 MG PO TABS: 12.5000 mg | ORAL_TABLET | Freq: Two times a day (BID) | ORAL | Status: DC

## 2014-05-18 NOTE — Telephone Encounter (Signed)
carvedilol (COREG) 12.5 MG tablet [facility]  Patient has a week supply left

## 2014-05-18 NOTE — Telephone Encounter (Signed)
Escribed Coreg as requested

## 2014-05-30 ENCOUNTER — Telehealth: Payer: Self-pay | Admitting: Cardiology

## 2014-05-30 NOTE — Telephone Encounter (Signed)
Her echo was read but did not cross into epic for some reason. Her heart function is normal, one of her heart valves (mitral) is mildly leaky but this is nothing to be concerned about and is very common, but this is the cause of her murmur  Zandra Abts MD

## 2014-05-30 NOTE — Telephone Encounter (Signed)
RX was escibed and confirmed receipt by [facility] on 05/18/14 at 5:01 pm.I called [facility] and they state pt has inactive insurance. Alphonsus Sias is investigating. I will [facility] can [facility] 8 days worth for $11.99  I cannot find echo results, have messaged  Dr Harl Bowie

## 2014-05-30 NOTE — Telephone Encounter (Signed)
carvedilol (COREG) 12.5 MG tablet  Only has 2 days left on Rx Please send RX to mail order and a few [facility] supply [facility] so that she will not run out.  ALSO>  Patient has not been told results of her ECHO she had on April 29th. Please contact her in reference to both today.

## 2014-05-30 NOTE — Telephone Encounter (Signed)
[id number] 

## 2014-05-31 NOTE — Telephone Encounter (Signed)
I spoke with patient,gave her echo results and she states [facility] has shipped her medication

## 2014-06-29 ENCOUNTER — Ambulatory Visit (INDEPENDENT_AMBULATORY_CARE_PROVIDER_SITE_OTHER): Payer: Medicare Other | Admitting: Obstetrics and Gynecology

## 2014-06-29 VITALS — BP 120/60 | Ht 64.0 in | Wt 147.0 lb

## 2014-06-29 DIAGNOSIS — N952 Postmenopausal atrophic vaginitis: Secondary | ICD-10-CM

## 2014-06-29 DIAGNOSIS — R110 Nausea: Secondary | ICD-10-CM

## 2014-06-29 MED ORDER — ONDANSETRON 4 MG PO TBDP: 4.0000 mg | ORAL_TABLET | Freq: Three times a day (TID) | ORAL | Status: DC | PRN

## 2014-06-29 NOTE — Progress Notes (Signed)
Patient ID: [name], female   DOB: Dec 20, 1917, 81 y.o.   MRN: [id number]    [facility] Visit  Patient name: GENICE KIMBERLIN MRN [id number]  Date of birth: 02-17-1917  CC & HPI:  KARYL SHARRAR is a 81 y.o. female presenting today for followup of tx of vaginal estrogen q wk. Pt here today for follow up. Pt states that she was given Estrace cream to use once a week but continues to have the same problem as before. Pt wants to discuss with Dr. Glo Herring and wants to see if she should use the medication more often. Pt was seen by PCP Monday and told that she did not have an infection.  Pt has nocturia x 3, nightly, and she feels nausea. Her main concern is the nausea at night., and how she feels in upper abdomen. Pt taking cherry juice for gout , also ibuprofen coated for gi tract Pt able to make it to bathroom to void  ROS:  No wt gain , or loss , is on diuretic q am. Denies leg swelling  Pertinent History Reviewed:   Reviewed: Significant for just doesn't like the feeling in upper abdomen that she experiences at night when she gets up/ Medical         Past Medical History  Diagnosis Date  . 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 IBS (irritable bowel syndrome)     Non-ulcer dyspepsia; hemorrhoids  . HTN (hypertension)   . Glaucoma   . Allergy history, radiographic dye   . Urinary tract infection   . Arthritis   . Dysuria   . Overactive bladder   . Urinary frequency                               Surgical Hx:    Past Surgical History  Procedure Laterality Date  . Appendectomy    . Vaginal hysterectomy    . Ovarian cyst removal    . Colonoscopy  2008, 2011    diverticulitis diagnosed, 59mm sessile polyp removed inascending colon. area tattood for future observation and reccomended f/u colonoscopy in one year  . Colonoscopy N/A 04/24/2012   SLF:The colon IS redundant/ Single RECTAL polyp/Moderate diverticulosis/Small internal hemorrhoids   Medications: Reviewed & Updated - see associated section                       Current outpatient prescriptions:  .  acetaminophen (TYLENOL) 500 MG tablet, Take 650 mg by mouth 3 (three) times daily as needed for pain. , Disp: , Rfl:  .  ALPRAZolam (XANAX) 0.25 MG tablet, Take 0.25 mg by mouth at bedtime as needed for anxiety. , Disp: , Rfl:  .  amLODipine-valsartan (EXFORGE) 5-320 MG per tablet, Take 0.5 tablets by mouth daily. , Disp: , Rfl:  .  carvedilol (COREG) 12.5 MG tablet, Take 1 tablet (12.5 mg total) by mouth 2 (two) times daily with a meal., Disp: 180 tablet, Rfl: 3 .  chlorthalidone (HYGROTON) 25 MG tablet, Take 25 mg by mouth daily., Disp: , Rfl:  .  estradiol (ESTRACE VAGINAL) 0.1 MG/GM vaginal cream, Place 1 Applicatorful vaginally daily., Disp: 4 g, Rfl: 0 .  fish oil-omega-3 fatty acids 1000 MG capsule, Take 1 g by mouth 2 (two) times daily. ,  Disp: , Rfl:  .  latanoprost (XALATAN) 0.005 % ophthalmic solution, Place 1 drop into both eyes at bedtime.  , Disp: , Rfl:  .  Multiple Vitamins-Minerals (MULTI COMPLETE PO), Take 1 tablet by mouth daily. , Disp: , Rfl:  .  pantoprazole (PROTONIX) 40 MG tablet, TAKE 1 BY MOUTH TWICE DAILY, Disp: 60 tablet, Rfl: 5 .  Probiotic Product (PHILLIPS COLON HEALTH PO), Take 1 capsule by mouth daily. , Disp: , Rfl:  .  traMADol (ULTRAM) 50 MG tablet, Take 1 tablet (50 mg total) by mouth every 6 (six) hours as needed., Disp: 15 tablet, Rfl: 0   Social History: Reviewed -  reports that she has never smoked. She has never used smokeless tobacco.  Objective Findings:  Vitals: Blood pressure 120/60, height 5\' 4"  (1.626 m), weight 147 lb (66.679 kg).  Physical Examination: General appearance - alert, well appearing, and in no distress, oriented to person, place, and time and normal appearing weight Mental status - alert, oriented to person, place,  and time Abdomen - soft, nontender, nondistended, no masses or organomegaly Pelvic - normal external genitalia, vulva, vagina, and adnexa cervix and uterus gone   Assessment & Plan:   A: no gyn probs found 1. Pt to double up on estrace VC to biweekly   P: 1. Pt to double up on estrace VC to biweekly    2 ?Rx Zofran 4 mg hs prn  1.

## 2014-06-29 NOTE — Addendum Note (Signed)
Addended by: Farley Ly on: 06/29/2014 04:25 PM   Modules accepted: Orders

## 2014-06-30 ENCOUNTER — Telehealth: Payer: Self-pay | Admitting: *Deleted

## 2014-06-30 NOTE — Telephone Encounter (Signed)
Pt aware that Dr. Glo Herring has sent in prior authorization.

## 2014-07-27 ENCOUNTER — Encounter: Payer: Self-pay | Admitting: Obstetrics and Gynecology

## 2014-07-27 ENCOUNTER — Ambulatory Visit (INDEPENDENT_AMBULATORY_CARE_PROVIDER_SITE_OTHER): Payer: Medicare Other | Admitting: Obstetrics and Gynecology

## 2014-07-27 VITALS — BP 120/76 | Ht 60.0 in

## 2014-07-27 DIAGNOSIS — R351 Nocturia: Secondary | ICD-10-CM

## 2014-07-27 NOTE — Progress Notes (Signed)
Patient ID: Deborah Farrell, female   DOB: 04-Jun-1917, 81 y.o.   MRN: [id number] Pt here today for follow up. Pt states that she did not get the nausea medication due to the price. Pt states that she did not increase the usage of the Estrace Cream but has not had an problems since her last visit. Pt seems very quiet today and doesn't seem to feel well. Pt states that she is still having the same problem as before.

## 2014-07-27 NOTE — Progress Notes (Signed)
Patient ID: Deborah Farrell, female   DOB: Feb 15, 1917, 81 y.o.   MRN: [id number]   [facility] Visit  Patient name: Deborah Farrell MRN [id number]  Date of birth: Dec 17, 1917  CC & HPI:  Deborah Farrell is a 81 y.o. female presenting today for follow-up. She complains of 3-4 episodes of nocturia a night. She denies double or incomplete voiding. Patient states she otherwise feels well today. She had seen her PCP Dr. [place] Crane a month ago, who told her her symptoms were not due to her bladder. She has increased her Estrace cream to 2x a week, with no issues since. She also notes having more gas than usual, which she is treated with Gas-X. She is scheduled to see her GI doctor, Dr. Trinda Pascal, in about 1 week. She denies weight loss or any bowel concerns.    ROS:  A complete review of systems was obtained and all systems are negative except as noted in the HPI and PMH.    Pertinent History Reviewed:   Reviewed: Significant for vaginal hysterectomy Medical         Past Medical History  Diagnosis Date  . 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 IBS (irritable bowel syndrome)     Non-ulcer dyspepsia; hemorrhoids  . HTN (hypertension)   . Glaucoma   . Allergy history, radiographic dye   . Urinary tract infection   . Arthritis   . Dysuria   . Overactive bladder   . Urinary frequency                               Surgical Hx:    Past Surgical History  Procedure Laterality Date  . Appendectomy    . Vaginal hysterectomy    . Ovarian cyst removal    . Colonoscopy  2008, 2011    diverticulitis diagnosed, 47mm sessile polyp removed inascending colon. area tattood for future observation and reccomended f/u colonoscopy in one year  . Colonoscopy N/A 04/24/2012    SLF:The colon IS redundant/ Single RECTAL polyp/Moderate diverticulosis/Small internal hemorrhoids    Medications: Reviewed & Updated - see associated section                       Current outpatient prescriptions:  .  acetaminophen (TYLENOL) 500 MG tablet, Take 650 mg by mouth 3 (three) times daily as needed for pain. , Disp: , Rfl:  .  ALPRAZolam (XANAX) 0.25 MG tablet, Take 0.25 mg by mouth at bedtime as needed for anxiety. , Disp: , Rfl:  .  amLODipine-valsartan (EXFORGE) 5-320 MG per tablet, Take 0.5 tablets by mouth daily. , Disp: , Rfl:  .  carvedilol (COREG) 12.5 MG tablet, Take 1 tablet (12.5 mg total) by mouth 2 (two) times daily with a meal., Disp: 180 tablet, Rfl: 3 .  chlorthalidone (HYGROTON) 25 MG tablet, Take 25 mg by mouth daily., Disp: , Rfl:  .  estradiol (ESTRACE VAGINAL) 0.1 MG/GM vaginal cream, Place 1 Applicatorful vaginally daily., Disp: 4 g, Rfl: 0 .  fish oil-omega-3 fatty acids 1000 MG capsule, Take 1 g by mouth 2 (two) times daily. , Disp: , Rfl:  .  latanoprost (XALATAN) 0.005 % ophthalmic solution, Place 1 drop into both eyes at bedtime.  , Disp: , Rfl:  .  Multiple Vitamins-Minerals (MULTI COMPLETE PO), Take 1 tablet by mouth daily. , Disp: , Rfl:  .  ondansetron (ZOFRAN ODT) 4 MG disintegrating tablet, Take 1 tablet (4 mg total) by mouth every 8 (eight) hours as needed for nausea., Disp: 20 tablet, Rfl: 3 .  pantoprazole (PROTONIX) 40 MG tablet, TAKE 1 BY MOUTH TWICE DAILY, Disp: 60 tablet, Rfl: 5 .  Probiotic Product (PHILLIPS COLON HEALTH PO), Take 1 capsule by mouth daily. , Disp: , Rfl:  .  traMADol (ULTRAM) 50 MG tablet, Take 1 tablet (50 mg total) by mouth every 6 (six) hours as needed., Disp: 15 tablet, Rfl: 0   Social History: Reviewed -  reports that she has never smoked. She has never used smokeless tobacco.  Objective Findings:  Vitals: Blood pressure 120/76, height 5' (1.524 m).  Physical Examination: General appearance - alert, well appearing, and in no distress, oriented to person, place, and time and normal appearing weight Mental status -  alert, oriented to person, place, and time, normal mood, behavior, speech, dress, motor activity, and thought processes, drowsy   Assessment & Plan:   A:  1. Nocturia, without evidence of UTI 2. Atrophic vaginitis , stable under control with current tx  P:  1. F/u with primary care prn urinary c/o 2. Continue estrace vc biweekly   This chart was SCRIBED for Deborah Shirk, MD by Stephania Fragmin, ED Scribe. This patient was seen in my office, and the patient's care was started at 12:03 PM.  I personally performed the services described in this documentation, which was SCRIBED in my presence. The recorded information has been reviewed and considered accurate. It has been edited as necessary during review. Jonnie Kind, MD

## 2014-08-04 ENCOUNTER — Encounter: Payer: Self-pay | Admitting: Gastroenterology

## 2014-08-04 ENCOUNTER — Ambulatory Visit (INDEPENDENT_AMBULATORY_CARE_PROVIDER_SITE_OTHER): Payer: Medicare Other | Admitting: Gastroenterology

## 2014-08-04 VITALS — BP 106/55 | HR 60 | Temp 97.2°F | Ht 64.0 in | Wt 150.6 lb

## 2014-08-04 DIAGNOSIS — R140 Abdominal distension (gaseous): Secondary | ICD-10-CM | POA: Diagnosis not present

## 2014-08-04 DIAGNOSIS — K219 Gastro-esophageal reflux disease without esophagitis: Secondary | ICD-10-CM

## 2014-08-04 DIAGNOSIS — D369 Benign neoplasm, unspecified site: Secondary | ICD-10-CM | POA: Diagnosis not present

## 2014-08-04 DIAGNOSIS — K648 Other hemorrhoids: Secondary | ICD-10-CM

## 2014-08-04 MED ORDER — PREDNISONE 10 MG PO TABS: ORAL_TABLET | ORAL | Status: DC

## 2014-08-04 MED ORDER — PREDNISONE 1 MG PO TABS: ORAL_TABLET | ORAL | Status: DC

## 2014-08-04 NOTE — Assessment & Plan Note (Addendum)
NO INDICATION FOR ENDOSCOPY AT THIS TIME.  Next TCS in 2019 if THE benefits outweigh the risks.

## 2014-08-04 NOTE — Assessment & Plan Note (Signed)
SYMPTOMS CONTROLLED/RESOLVED.  CONTINUE TO MONITOR SYMPTOMS. 

## 2014-08-04 NOTE — Assessment & Plan Note (Signed)
SYMPTOMS CONTROLLED/RESOLVED ON PROTONIX TWICE DAILY. BID DOSING CAUSED LOOSE STOOLS.  PROTONIX DAILY CONTINUE TO MONITOR SYMPTOMS.

## 2014-08-04 NOTE — Patient Instructions (Addendum)
TAKE PREDNISONE 10 MG TWICE DAILY FOR 7 DAYS THEN 10 MG ONCE A DAY FOR 7 DAYS THEN 5 MG ONCE DAILY FOR 7 DAYS THEN   START NEW BOTTLE-PRESCRIPTION FOR PREDNISONE 1 MG TABLETS: 4 PILLS DAILY FOR 5 DAYS THEN 3 PILLS DAILY FOR 5 DAYS THEN 2 PILLS DAILY FOR 5 DAYS THEN ONE PILL DAILY FOR 5 DAYS THEN STOP.  TAKE PREDNISONE WITH FOOD OR MILK TO PREVENT GI UPSET.  STEROIDS CAN CAUSE INSOMNIA, NAUSEA, AND ABDOMINAL PAIN.  PLEASE CALL ME WITH QUESTIONS OR CONCERNS.  FOLLOW UP IN 3 MOS.

## 2014-08-04 NOTE — Progress Notes (Signed)
ON RECALL  °

## 2014-08-04 NOTE — Assessment & Plan Note (Addendum)
Most likely due to gi upset from NSAIDS.  PREDNISONE TAPER FOR ONE MONTH. IBUPROFEN PRN TAKE MEDS WITH FOOD OR MILK. MED WARNINGS GIVEN. FOLLOW UP IN 3 MOS.

## 2014-08-04 NOTE — Progress Notes (Signed)
Subjective:    Patient ID: Deborah Farrell, female    DOB: 02-12-1917, 81 y.o.   MRN: [id number]  Deborah Cahill, MD   HPI After she eats she may feel MILD NAUSEA OR GASSY. SX BETTER WITH GAS-X. HAPPENS 2-3 TIMES A WEEK. NOT AN ALL DAY THING. NO PARTICULAR FOODS TRIGGER IT. 2-3X/WEEK AT NIGHT WILL TAKE A GAS-X AND FEELS BETTER IN THE MORNING. SX PASS IN A FEW MINS. DEVELOPED GOUT IN LEFT TOE AND TAKING A COATED IBUPROFEN (2 BID)& INDOMETHACIN SINCE DEC 2015 AND MAKES STOOL FORMED BUT WITH RED IN IT. ONLY HAPPENS WHEN SHE TAKES COATED IBUPROFEN. TOE STILL HURTING. BMs: OTHERWISE GOOD. WEIGHT STAYS AROUND 149 LBS. BACK PAIN IS LOUSY.   PT DENIES FEVER, CHILLS, HEMATOCHEZIA, vomiting, melena, diarrhea, CHEST PAIN, SHORTNESS OF BREATH,  CHANGE IN BOWEL IN HABITS, constipation, abdominal pain, problems swallowing, OR heartburn or indigestion.  Past Medical History  Diagnosis Date  . 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 IBS (irritable bowel syndrome)     Non-ulcer dyspepsia; hemorrhoids  . HTN (hypertension)   . Glaucoma   . Allergy history, radiographic dye   . Urinary tract infection   . Arthritis   . Dysuria   . Overactive bladder   . Urinary frequency    Past Surgical History  Procedure Laterality Date  . Appendectomy    . Vaginal hysterectomy    . Ovarian cyst removal    . Colonoscopy  2008, 2011    diverticulitis diagnosed, 69mm sessile polyp removed inascending colon. area tattood for future observation and reccomended f/u colonoscopy in one year  . Colonoscopy N/A 04/24/2012    SLF:The colon IS redundant/ Single RECTAL polyp/Moderate diverticulosis/Small internal hemorrhoids   Allergies  Allergen Reactions  . Aciphex [Rabeprazole] Other (See Comments)    intolerance  . Dicyclomine Hcl Other (See Comments)    Unknown  . Hyoscyamine Sulfate Other (See  Comments)    Unknown  . Iodinated Diagnostic Agents   . Nexium [Esomeprazole Magnesium] Other (See Comments)    G.I. Upset  . Omeprazole Other (See Comments)    GI upset  . Sulfonamide Derivatives Other (See Comments)    Unknown   Current Outpatient Prescriptions  Medication Sig Dispense Refill  . ALPRAZolam (XANAX) 0.25 MG tablet Take 0.25 mg by mouth at bedtime as needed for anxiety.     Marland Kitchen amLODipine-valsartan (EXFORGE) 5-320 MG per tablet Take 0.5 tablets by mouth daily.     . carvedilol (COREG) 12.5 MG tablet Take 1 tablet (12.5 mg total) by mouth 2 (two) times daily with a meal.    . chlorthalidone (HYGROTON) 25 MG tablet Take 25 mg by mouth daily. Take 1/2 tab    . estradiol (ESTRACE VAGINAL) 0.1 MG/GM vaginal cream Place 1 Applicatorful vaginally daily.    . fish oil-omega-3 fatty acids 1000 MG capsule Take 1 g by mouth 2 (two) times daily.     Marland Kitchen latanoprost (XALATAN) 0.005 % ophthalmic solution Place 1 drop into both eyes at bedtime.      . Multiple Vitamins-Minerals (MULTI COMPLETE PO) Take 1 tablet by mouth daily.     . pantoprazole (PROTONIX) 40 MG tablet TAKE 1 BY ONCE DAILY    . OTC COATED IBUPROFEN     . acetaminophen 500 MG tablet Take 650 mg PO TID as needed for pain.     Marland Kitchen      Marland Kitchen  Probiotic Product (PHILLIPS COLON HEALTH PO) Take 1 capsule by mouth daily.     Marland Kitchen [place] COMPOUND FOR HER TOE      Review of Systems     Objective:   Physical Exam  Constitutional: She is oriented to person, place, and time. She appears well-developed and well-nourished. No distress.  HENT:  Head: Normocephalic and atraumatic.  Mouth/Throat: Oropharynx is clear and moist. No oropharyngeal exudate.  Eyes: Pupils are equal, round, and reactive to light. No scleral icterus.  Neck: Normal range of motion. Neck supple.  Cardiovascular: Normal rate, regular rhythm and normal heart sounds.   Pulmonary/Chest: Effort normal and breath sounds normal. No respiratory distress.  Abdominal: Soft.  Bowel sounds are normal. She exhibits no distension. There is no tenderness.  Musculoskeletal: She exhibits no edema.  WALKS ASSISTED WITH A CANE & with a limp.  Lymphadenopathy:    She has no cervical adenopathy.  Neurological: She is alert and oriented to person, place, and time.  Psychiatric: She has a normal mood and affect.  Vitals reviewed.         Assessment & Plan:

## 2014-08-04 NOTE — Progress Notes (Signed)
CC'ED TO PCP 

## 2014-08-31 ENCOUNTER — Telehealth: Payer: Self-pay

## 2014-08-31 NOTE — Telephone Encounter (Signed)
Pt came by the office and said [facility] had called her 2 days in a row about some prednisone medication they had for her. She said she told them she was coming by the office to find out what they were talking about.  She said she has finished the Prednisone 10 mg tablets and has not started on the bottle of 1 mg tablets.  [facility] called [facility] and spoke to [facility] who said they do not have any meds for her and he thinks it could have been the computer doing a courtesy call.  She will let us know if she has any other questions.

## 2014-08-31 NOTE — Telephone Encounter (Signed)
REVIEWED-NO ADDITIONAL RECOMMENDATIONS. 

## 2014-09-08 ENCOUNTER — Telehealth: Payer: Self-pay

## 2014-09-08 MED ORDER — PANTOPRAZOLE SODIUM 40 MG PO TBEC: DELAYED_RELEASE_TABLET | ORAL | Status: DC

## 2014-09-08 NOTE — Telephone Encounter (Signed)
Pt came by the office and said she has run out of refills on Pantoprazole and needs it sent to the Mail Order that is listed.  She gets 3 month supply.

## 2014-09-08 NOTE — Telephone Encounter (Signed)
Pt's niece called to remind [place] that Dr. Oneida Alar had decreased the Pantoprazole to qd. She had originally been on bid and that is the way that Darolyn Rua in today. The mail order will not fill it and they are requesting the script be sent in again for qd. Please resend!  Thanks!

## 2014-09-08 NOTE — Telephone Encounter (Signed)
PT is aware the Rx has been sent.

## 2014-09-08 NOTE — Telephone Encounter (Signed)
Forwarding to Rx Refills

## 2014-09-08 NOTE — Telephone Encounter (Signed)
Completed.

## 2014-09-09 MED ORDER — PANTOPRAZOLE SODIUM 40 MG PO TBEC: DELAYED_RELEASE_TABLET | ORAL | Status: DC

## 2014-09-09 NOTE — Telephone Encounter (Signed)
Pt is aware.  

## 2014-09-09 NOTE — Addendum Note (Signed)
Addended by: Orvil Feil on: 09/09/2014 08:13 AM   Modules accepted: Orders

## 2014-09-09 NOTE — Telephone Encounter (Signed)
Completed.

## 2014-09-21 ENCOUNTER — Encounter: Payer: Self-pay | Admitting: Gastroenterology

## 2014-10-25 ENCOUNTER — Ambulatory Visit (INDEPENDENT_AMBULATORY_CARE_PROVIDER_SITE_OTHER): Payer: Medicare Other | Admitting: Gastroenterology

## 2014-10-25 ENCOUNTER — Encounter: Payer: Self-pay | Admitting: Gastroenterology

## 2014-10-25 VITALS — BP 100/61 | HR 66 | Temp 97.2°F | Ht 60.0 in | Wt 153.4 lb

## 2014-10-25 DIAGNOSIS — R1013 Epigastric pain: Secondary | ICD-10-CM

## 2014-10-25 MED ORDER — ONDANSETRON 4 MG PO TBDP: ORAL_TABLET | ORAL | Status: DC

## 2014-10-25 NOTE — Progress Notes (Signed)
cc'ed to pcp °

## 2014-10-25 NOTE — Assessment & Plan Note (Signed)
SYMPTOMS NOT IDEALLY CONTROLLED.  ADD ZOFRAN SL QAM TO PREVENT AM NAUSEA. CONTINUE PROTONIX. TAKE 30 MINUTES PRIOR TO BREAKFAST. OUTPATIENT VISIT IN 4 MOS.

## 2014-10-25 NOTE — Progress Notes (Signed)
On recall  °

## 2014-10-25 NOTE — Progress Notes (Signed)
Subjective:    Patient ID: Deborah Farrell, female    DOB: Oct 03, 1917, 81 y.o.   MRN: [id number]  Wende Neighbors, MD  HPI FEELS SICK IN STOMACH/CHEST OCCASIONALLY(3-4 TIMES A WEEK). LASTS ~ 1 HOUR. HAPPENS WHEN SHE GETS UP IN THE MORNING. A MINUTE OR SO AFTER SHE WAKES UP. HAPPENING FOR COUPLE MOS. MILD SYMPTOMS & ABDOMINAL PAIN WITH "QUEAZINESS??". NO VOMITING. FEET AND TOE FEELS GOOD. STILL HAS BACK PAIN RADIATING TO R HIP.BMs: EVERY DAY(1-2X/DAY-FORMED).  PT DENIES FEVER, CHILLS, HEMATOCHEZIA, HEMATEMESIS, melena, diarrhea, CHEST PAIN, SHORTNESS OF BREATH, CHANGE IN BOWEL IN HABITS, constipation, problems swallowing, OR heartburn or indigestion.  Past Medical History  Diagnosis Date  . Tubulovillous adenoma 2007    with multifocal high grade dysplasia-[facility]FBH 2007;TV ADENOMA w/o dysplasia 2008;  SIMPLE ADENOMA 2009; 67mm SIMPLE ADENOMA JAN 2011  . IBS (irritable bowel syndrome)     Non-ulcer dyspepsia; hemorrhoids  . HTN (hypertension)   . Glaucoma   . Allergy history, radiographic dye   . Urinary tract infection   . Arthritis   . Dysuria   . Overactive bladder   . Urinary frequency    Past Surgical History  Procedure Laterality Date  . Appendectomy    . Vaginal hysterectomy    . Ovarian cyst removal    . Colonoscopy  2008, 2011    diverticulitis diagnosed, 28mm sessile polyp removed inascending colon. area tattood for future observation and reccomended f/u colonoscopy in one year  . Colonoscopy N/A 04/24/2012    SLF:The colon IS redundant/ Single RECTAL polyp/Moderate diverticulosis/Small internal hemorrhoids   Allergies  Allergen Reactions  . Aciphex [Rabeprazole] Other (See Comments)    intolerance  . Colchicine   . Dicyclomine Hcl Other (See Comments)    Unknown  . Hyoscyamine Sulfate Other (See Comments)    Unknown  . Iodinated Diagnostic Agents   . Nexium [Esomeprazole Magnesium] Other (See Comments)    G.I. Upset  . Omeprazole Other (See Comments)    GI upset  .  Sulfonamide Derivatives Other (See Comments)    Unknown   Current Outpatient Prescriptions  Medication Sig Dispense Refill  . acetaminophen (TYLENOL) 500 MG tablet Take 650 mg by mouth 3 (three) times daily as needed for pain.     Marland Kitchen ALPRAZolam (XANAX) 0.25 MG tablet Take 0.25 mg by mouth at bedtime as needed for anxiety.     Marland Kitchen amLODipine-valsartan (EXFORGE) 5-320 MG per tablet Take 0.5 tablets by mouth daily.     . carvedilol (COREG) 12.5 MG tablet Take 1 tablet (12.5 mg total) by mouth 2 (two) times daily with a meal.    . chlorthalidone (HYGROTON) 25 MG tablet Take 25 mg by mouth daily. Take 1/2 tab    . estradiol (ESTRACE VAGINAL) 0.1 MG/GM vaginal cream Place 1 Applicatorful vaginally daily.    . fish oil-omega-3 fatty acids 1000 MG capsule Take 1 g by mouth 2 (two) times daily.     Marland Kitchen latanoprost (XALATAN) 0.005 % ophthalmic solution Place 1 drop into both eyes at bedtime.      . Multiple Vitamins-Minerals (MULTI COMPLETE PO) Take 1 tablet by mouth daily.     .      . pantoprazole (PROTONIX) 40 MG tablet TAKE 1 BY MOUTH daily    . Probiotic Product (PHILLIPS COLON HEALTH PO) Take 1 capsule by mouth daily.     . traMADol (ULTRAM) 50 MG tablet Take 1 tablet (50 mg total) by mouth every 6 (six) hours as needed.    Marland Kitchen  UNABLE TO FIND Med Name: Narda Amber apothecary compound to rub on toe for gout.    .      .       Review of Systems PER HPI OTHERWISE ALL SYSTEMS ARE NEGATIVE.    Objective:   Physical Exam  Constitutional: She is oriented to person, place, and time. She appears well-developed and well-nourished. No distress.  HENT:  Head: Normocephalic and atraumatic.  Mouth/Throat: Oropharynx is clear and moist. No oropharyngeal exudate.  Eyes: Pupils are equal, round, and reactive to light. No scleral icterus.  Neck: Normal range of motion. Neck supple.  Cardiovascular: Normal rate, regular rhythm and normal heart sounds.   Pulmonary/Chest: Effort normal and breath sounds normal. No  respiratory distress.  Abdominal: Soft. Bowel sounds are normal. She exhibits no distension. There is no tenderness.  Musculoskeletal: She exhibits no edema.  Lymphadenopathy:    She has no cervical adenopathy.  Neurological: She is alert and oriented to person, place, and time.  NO  NEW FOCAL DEFICITS  Psychiatric: She has a normal mood and affect.  Vitals reviewed.         Assessment & Plan:

## 2014-10-25 NOTE — Patient Instructions (Signed)
FIRST THING IN THE MORNING, Place ZOFRAN UNDER YOUR TONGUE AND LET IT DISSOLVE, THEN RINSE WITH WATER.  CONTINUE PROTONIX. TAKE 30 MINUTES PRIOR TO BREAKFAST.  PLEASE CALL IN ONE MONTH IF SICKNESS IN THE MORNING IS NOT BETTER.  FOLLOW UP IN 4 MOS.

## 2014-11-02 ENCOUNTER — Telehealth: Payer: Self-pay

## 2014-11-02 NOTE — Telephone Encounter (Signed)
Tried to do a PA for pts zofran. It was denied. Do you want to do an appeal? I have put the paperwork in [facility] office

## 2014-11-03 NOTE — Telephone Encounter (Signed)
WILL DRAFT APPEAL LETTER.

## 2014-11-08 NOTE — Telephone Encounter (Signed)
PT came by the office and said she is not able to get her medication yet. I told her that we are aware and that Dr. Oneida Alar has to do an appeal letter.

## 2014-11-09 ENCOUNTER — Encounter: Payer: Self-pay | Admitting: General Practice

## 2014-11-09 MED ORDER — ONDANSETRON HCL 4 MG PO TABS: ORAL_TABLET | ORAL | Status: DC

## 2014-11-09 NOTE — Addendum Note (Signed)
Addended by: Danie Binder on: 11/09/2014 05:05 PM   Modules accepted: Orders, Medications

## 2014-11-09 NOTE — Telephone Encounter (Signed)
Routing to Doris 

## 2014-11-09 NOTE — Telephone Encounter (Signed)
CALLED TO APPEAL DENIAL. ON PHONE FOR 25 MINS. ASKED TO SUBMIT LETTER TO APPEAL DENIAL. LETTER DRAFTED.   PLEASE CALL PT. IT MAY TAKE 7-30 DAYS FOR A RESPONSE FROM [employer].

## 2014-11-10 NOTE — Telephone Encounter (Signed)
I called and informed pt.

## 2014-11-11 ENCOUNTER — Telehealth: Payer: Self-pay

## 2014-11-11 NOTE — Telephone Encounter (Signed)
Another phone call from [place] at [place]. Asking the CAUSE of the nausea. I told her I don't see an exact cause, I will inform Dr. Oneida Alar although she is out of the office/hospital until next Thurs. She said they are making a decision today. I sent Dr. Oneida Alar a text message to her cell phone incase she is able to call them. Phone number is [phone].

## 2014-11-11 NOTE — Telephone Encounter (Signed)
T/C from Jalene Mullet at [facility] asking questions about the pt's nausea. She wanted to know if she had chemo or radiation induced nausea, or post opt nausea and I told her no. I asked her when would we hear and she said it would be reviewed today and she will call back after the review.

## 2014-11-11 NOTE — Telephone Encounter (Signed)
Dr. Oneida Alar text me back pt can have Compazine 10 mg #30 one Q AM with 2 refills. I called and told Abigail Butts and she asked if  Dr. Oneida Alar would like to withdraw the appeal for the Zofran. I said "YES, IF insurance is not going to cover it.

## 2014-11-11 NOTE — Telephone Encounter (Signed)
I received a text message from Dr. Oneida Alar, pt has non ulcer dyspepsia and gerd. I called Abigail Butts and left the message on Vm.   Abigail Butts called back and would like to know if prochlorperazine or chlorpromazine would be an option.  I am texting Dr. Oneida Alar again.

## 2014-11-14 NOTE — Telephone Encounter (Signed)
REVIEWED. AGREE. NO ADDITIONAL RECOMMENDATIONS. 

## 2014-11-17 NOTE — Telephone Encounter (Signed)
LMOM for pt that the Rx has been called in.

## 2014-11-17 NOTE — Telephone Encounter (Signed)
Pt came by the office and would like the Rx sent to [facility] in [place]. I called and gave the order to Shay for : Compazine 10 mg  #30 to take one tablet in the Am with 2 refills.

## 2014-11-17 NOTE — Telephone Encounter (Signed)
REVIEWED-NO ADDITIONAL RECOMMENDATIONS. 

## 2015-01-30 ENCOUNTER — Encounter: Payer: Self-pay | Admitting: Gastroenterology

## 2015-02-13 ENCOUNTER — Other Ambulatory Visit: Payer: Self-pay

## 2015-02-13 MED ORDER — PANTOPRAZOLE SODIUM 40 MG PO TBEC: DELAYED_RELEASE_TABLET | ORAL | Status: DC

## 2015-02-16 DIAGNOSIS — M9903 Segmental and somatic dysfunction of lumbar region: Secondary | ICD-10-CM | POA: Diagnosis not present

## 2015-02-16 DIAGNOSIS — M5136 Other intervertebral disc degeneration, lumbar region: Secondary | ICD-10-CM | POA: Diagnosis not present

## 2015-02-16 DIAGNOSIS — M9905 Segmental and somatic dysfunction of pelvic region: Secondary | ICD-10-CM | POA: Diagnosis not present

## 2015-02-16 DIAGNOSIS — M545 Low back pain: Secondary | ICD-10-CM | POA: Diagnosis not present

## 2015-02-17 DIAGNOSIS — B351 Tinea unguium: Secondary | ICD-10-CM | POA: Diagnosis not present

## 2015-02-17 DIAGNOSIS — M79674 Pain in right toe(s): Secondary | ICD-10-CM | POA: Diagnosis not present

## 2015-02-22 ENCOUNTER — Ambulatory Visit (INDEPENDENT_AMBULATORY_CARE_PROVIDER_SITE_OTHER): Payer: Medicare Other | Admitting: Obstetrics and Gynecology

## 2015-02-22 VITALS — BP 120/70 | HR 74 | Ht 64.0 in | Wt 151.8 lb

## 2015-02-22 DIAGNOSIS — N952 Postmenopausal atrophic vaginitis: Secondary | ICD-10-CM

## 2015-02-22 NOTE — Progress Notes (Signed)
Patient ID: Deborah Farrell, female   DOB: 05/23/1917, 82 y.o.   MRN: [id number]   [facility] Visit  Patient name: Deborah Farrell MRN [id number]  Date of birth: 03-01-1917  CC & HPI:  Deborah Farrell is a 82 y.o. female presenting today for a follow-up appointment. Pt reports that she received a letter from [facility] advising her to follow-up with this clinic for atrophic vaginitis. Pt reports she has been compliant with estrace use, however, complains of intermittent bilateral breast tenderness. During her last visit to the clinic on 07/27/14, pt was advised to continue estrace vc biweekly.   As a secondary matter, pt also complains of rhinorrhea with associated nasal congestion onset several days ago. Pt denies SOB or any other associated Sx at this time.   ROS:  10 Systems reviewed and all are negative for acute change except as noted in the HPI.  Pertinent History Reviewed:   Reviewed: Significant for HTN. UTI. Dysuria, urinary frequency, overactive bladder.  Medical         Past Medical History  Diagnosis Date  . 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 IBS (irritable bowel syndrome)     Non-ulcer dyspepsia; hemorrhoids  . HTN (hypertension)   . Glaucoma   . Allergy history, radiographic dye   . Urinary tract infection   . Arthritis   . Dysuria   . Overactive bladder   . Urinary frequency                               Surgical Hx:    Past Surgical History  Procedure Laterality Date  . Appendectomy    . Vaginal hysterectomy    . Ovarian cyst removal    . Colonoscopy  2008, 2011    diverticulitis diagnosed, 26mm sessile polyp removed inascending colon. area tattood for future observation and reccomended f/u colonoscopy in one year  . Colonoscopy N/A 04/24/2012    SLF:The colon IS redundant/ Single RECTAL polyp/Moderate diverticulosis/Small internal  hemorrhoids   Medications: Reviewed & Updated - see associated section                       Current outpatient prescriptions:  .  acetaminophen (TYLENOL) 500 MG tablet, Take 650 mg by mouth 3 (three) times daily as needed for pain. , Disp: , Rfl:  .  amLODipine-valsartan (EXFORGE) 5-320 MG per tablet, Take 0.5 tablets by mouth daily. , Disp: , Rfl:  .  carvedilol (COREG) 12.5 MG tablet, Take 1 tablet (12.5 mg total) by mouth 2 (two) times daily with a meal., Disp: 180 tablet, Rfl: 3 .  chlorthalidone (HYGROTON) 25 MG tablet, Take 25 mg by mouth daily. Take 1/2 tab, Disp: , Rfl:  .  estradiol (ESTRACE VAGINAL) 0.1 MG/GM vaginal cream, Place 1 Applicatorful vaginally daily., Disp: 4 g, Rfl: 0 .  latanoprost (XALATAN) 0.005 % ophthalmic solution, Place 1 drop into both eyes at bedtime.  , Disp: , Rfl:  .  Multiple Vitamins-Minerals (MULTI COMPLETE PO), Take 1 tablet by mouth daily. , Disp: , Rfl:  .  pantoprazole (PROTONIX) 40 MG tablet, TAKE 1 BY MOUTH daily, Disp: 90 tablet, Rfl: 3 .  Probiotic Product (PHILLIPS COLON HEALTH PO), Take 1 capsule by mouth daily. , Disp: , Rfl:  Social History: Reviewed -  reports that she has never smoked. She has never used smokeless tobacco.  Objective Findings:  Vitals: Blood pressure 120/70, pulse 74, height 5\' 4"  (1.626 m), weight 151 lb 12.8 oz (68.856 kg).  Physical Examination: General appearance - alert, well appearing, and in no distress and oriented to person, place, and time Mental status - alert, oriented to person, place, and time, normal mood, behavior, speech, dress, motor activity, and thought processes Pelvic - normal external genitalia, vulva, vagina, cervix, uterus and adnexa,  VULVA: normal appearing vulva with no masses, tenderness or lesions,  VAGINA: normal appearing vagina with normal color and discharge, no lesions,  CERVIX: normal appearing cervix without discharge or lesions, UTERUS: uterus is normal size, shape, consistency and  nontender,  ADNEXA: normal adnexa in size, nontender and no masses   Assessment & Plan:   A:  1. Atrophic vaginitis , resolved  P:  1. Continue estrace vc 1x/week     By signing my name below, I, Terressa Koyanagi, attest that this documentation has been prepared under the direction and in the presence of Mallory Shirk, MD. Electronically Signed: Terressa Koyanagi, ED Scribe. 02/22/2015. 10:48 AM.   I personally performed the services described in this documentation, which was SCRIBED in my presence. The recorded information has been reviewed and considered accurate. It has been edited as necessary during review. Jonnie Kind, MD

## 2015-02-23 ENCOUNTER — Other Ambulatory Visit: Payer: Self-pay | Admitting: *Deleted

## 2015-02-23 DIAGNOSIS — R531 Weakness: Secondary | ICD-10-CM | POA: Diagnosis not present

## 2015-02-23 DIAGNOSIS — R5383 Other fatigue: Secondary | ICD-10-CM | POA: Diagnosis not present

## 2015-02-23 DIAGNOSIS — J00 Acute nasopharyngitis [common cold]: Secondary | ICD-10-CM | POA: Diagnosis not present

## 2015-02-23 DIAGNOSIS — R05 Cough: Secondary | ICD-10-CM | POA: Diagnosis not present

## 2015-02-23 MED ORDER — ESTRADIOL 0.1 MG/GM VA CREA: 1.0000 | TOPICAL_CREAM | Freq: Every day | VAGINAL | Status: DC

## 2015-03-08 ENCOUNTER — Encounter: Payer: Self-pay | Admitting: Gastroenterology

## 2015-03-08 ENCOUNTER — Ambulatory Visit (INDEPENDENT_AMBULATORY_CARE_PROVIDER_SITE_OTHER): Payer: Medicare Other | Admitting: Gastroenterology

## 2015-03-08 VITALS — BP 117/62 | HR 68 | Temp 98.3°F | Ht 64.0 in | Wt 150.8 lb

## 2015-03-08 DIAGNOSIS — R1013 Epigastric pain: Secondary | ICD-10-CM | POA: Diagnosis not present

## 2015-03-08 MED ORDER — PROCHLORPERAZINE MALEATE 5 MG PO TABS: ORAL_TABLET | ORAL | Status: DC

## 2015-03-08 NOTE — Assessment & Plan Note (Addendum)
SYMPTOMS FAIRLY WELL CONTROLLED BUT ASSOCIATED WITH ANXIETY, WEIGHT LOSS AND TROUBLE ACCESSING FOOD.  FOLLOW A HIGH FIBER DIET. AVOID ITEMS THAT CAUSE BLOATING & GAS. HIGH FIBER DIET COMPAZINE PRN PROBIOTIC DAILY FOLLOW UP IN 3 MOS.   GREATER THAN 50% WAS SPENT IN COUNSELING & COORDINATION OF CARE WITH THE PATIENT: DISCUSSED  BENEFITS,  AND MANAGEMENT OF DYSPEPSAI AND FIGURING OUT HOW TO GET PT FOOD AND CLEANING SERVICES. TOTAL ENCOUNTER TIME: 15 MINS.

## 2015-03-08 NOTE — Progress Notes (Signed)
cc'ed to pcp °

## 2015-03-08 NOTE — Progress Notes (Signed)
Subjective:    Patient ID: Deborah Farrell, female    DOB: January 18, 1917, 82 y.o.   MRN: [id number]  Deborah Neighbors, MD  HPI HAVING NAUSEA: QUEAZY ONCE IN A WHILE. COMPAZINE HELPS. USING IT ONCE A DAY. MAKE HER DROWSY A LITTLE. BMs: GOOD. A LITTLE BLOATING AND FLATULENCE. RARE BELCHING. WATERY STOOLS: 1X/MO. MAY GET PAINS IN THE MIDDLE CHEST. FEELS VERY NERVOUS. STILL SEEING DR. MATT FOR BACK PAIN.  PT DENIES FEVER, CHILLS, HEMATOCHEZIA, vomiting, melena, diarrhea, CHEST PAIN, SHORTNESS OF BREATH, CHANGE IN BOWEL IN HABITS, constipation, abdominal pain, problems swallowing, OR heartburn or indigestion.  Past Medical History  Diagnosis Date  . 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 IBS (irritable bowel syndrome)     Non-ulcer dyspepsia; hemorrhoids  . HTN (hypertension)   . Glaucoma   . Allergy history, radiographic dye   . Urinary tract infection   . Arthritis   . Dysuria   . Overactive bladder   . Urinary frequency     Past Surgical History  Procedure Laterality Date  . Appendectomy    . Vaginal hysterectomy    . Ovarian cyst removal    . Colonoscopy  2008, 2011    diverticulitis diagnosed, 74mm sessile polyp removed inascending colon. area tattood for future observation and reccomended f/u colonoscopy in one year  . Colonoscopy N/A 04/24/2012    SLF:The colon IS redundant/ Single RECTAL polyp/Moderate diverticulosis/Small internal hemorrhoids   Allergies  Allergen Reactions  . Aciphex [Rabeprazole] Other (See Comments)    intolerance  . Colchicine   . Dicyclomine Hcl Other (See Comments)    Unknown  . Hyoscyamine Sulfate Other (See Comments)    Unknown  . Iodinated Diagnostic Agents   . Nexium [Esomeprazole Magnesium] Other (See Comments)    G.I. Upset  . Omeprazole Other (See Comments)    GI upset  . Sulfonamide Derivatives Other (See Comments)   Unknown    Current Outpatient Prescriptions  Medication Sig Dispense Refill  . acetaminophen 500 MG tablet Take 650 mg by mouth 3 (three) times daily as needed for pain.     Marland Kitchen amLODipine-valsartan  5-320 MG per tablet Take 0.5 tablets by mouth daily.     . carvedilol (COREG) 12.5 MG tablet Take 1 tablet  by mouth 2 (two) times daily with a meal.    . chlorthalidone 25 MG tablet Take 25 mg by mouth daily. Take 1/2 tab    . latanoprost 0.005 % ophthalmic solution Place 1 drop into both eyes at bedtime.      . Multiple Vitamins-Minerals  Take 1 tablet by mouth daily.     . pantoprazole (PROTONIX) 40 MG tablet TAKE 1 BY MOUTH daily    . PHILLIPS COLON HEALTH PO) Take 1 capsule by mouth daily.     .       Review of Systems PER HPI OTHERWISE ALL SYSTEMS ARE NEGATIVE.      Objective:   Physical Exam  Constitutional: She is oriented to person, place, and time. She appears well-developed and well-nourished. No distress.  HENT:  Head: Normocephalic and atraumatic.  Mouth/Throat: Oropharynx is clear and moist. No oropharyngeal exudate.  Eyes: Pupils are equal, round, and reactive to light. No scleral icterus.  Neck: Normal range of motion. Neck supple.  Cardiovascular: Normal rate, regular rhythm and normal heart sounds.   Pulmonary/Chest: Effort normal and breath  sounds normal. No respiratory distress.  Abdominal: Soft. Bowel sounds are normal. She exhibits no distension. There is no tenderness.  Musculoskeletal: She exhibits no edema.  WALKS ASSISTED WITH A CANE.   Lymphadenopathy:    She has no cervical adenopathy.  Neurological: She is alert and oriented to person, place, and time.  NO  NEW FOCAL DEFICITS  Psychiatric:  SLIGHTLY ANXIOUS MOOD, NL AFFECT  Vitals reviewed.     Assessment & Plan:

## 2015-03-08 NOTE — Patient Instructions (Signed)
DRINK WATER TO KEEP YOUR URINE LIGHT YELLOW.  FOLLOW A HIGH FIBER DIET. AVOID ITEMS THAT CAUSE BLOATING & GAS.  CONTINUE PROBIOTIC DAILY.  USE COMPAZINE AS NEEDED FOR NAUSEA.  FOLLOW UP IN 3 MOS.

## 2015-03-10 DIAGNOSIS — I10 Essential (primary) hypertension: Secondary | ICD-10-CM | POA: Diagnosis not present

## 2015-03-13 DIAGNOSIS — F5101 Primary insomnia: Secondary | ICD-10-CM | POA: Diagnosis not present

## 2015-03-13 DIAGNOSIS — D509 Iron deficiency anemia, unspecified: Secondary | ICD-10-CM | POA: Diagnosis not present

## 2015-03-13 DIAGNOSIS — I10 Essential (primary) hypertension: Secondary | ICD-10-CM | POA: Diagnosis not present

## 2015-03-13 DIAGNOSIS — R252 Cramp and spasm: Secondary | ICD-10-CM | POA: Diagnosis not present

## 2015-03-13 DIAGNOSIS — R1013 Epigastric pain: Secondary | ICD-10-CM | POA: Diagnosis not present

## 2015-03-13 DIAGNOSIS — N183 Chronic kidney disease, stage 3 (moderate): Secondary | ICD-10-CM | POA: Diagnosis not present

## 2015-03-16 ENCOUNTER — Telehealth: Payer: Self-pay

## 2015-03-16 NOTE — Telephone Encounter (Signed)
Pt called back and said she has an emergency to take care of at the [facility] tomorrow and will not be home. She is aware we close at noon tomorrow. Deborah Farrell she will call us back on Monday.

## 2015-03-16 NOTE — Telephone Encounter (Signed)
Pt called and said the compazine is causing loose stools. I asked if it was watery and she said no, just mushy. She has had 2 Bm's today that are mushy and she is concerned about it. Please advise!

## 2015-03-17 NOTE — Telephone Encounter (Signed)
Pt came by the office this morning and said that the Compazine is making her sick on her stomach. It is making her gassy and she is not able to hold her bowels at times. She feels nauseas at times. She is using a heating pad and that helps some. She has stopped the medication. Patient would like for you to call her, she is worried. I advised her that if anything changed to go to the ER to get checked out.

## 2015-03-22 NOTE — Telephone Encounter (Signed)
Patient came into the office and spoke with Dr. Oneida Alar vis telephone.  She expressed to Dr. Oneida Alar she feels much better after speaking with her and the call was disconnected.

## 2015-03-27 ENCOUNTER — Telehealth: Payer: Self-pay | Admitting: *Deleted

## 2015-03-27 NOTE — Telephone Encounter (Signed)
Pt came into office requesting samples of Estrace. 4 sample boxes given to pt. [place]

## 2015-03-28 NOTE — Telephone Encounter (Signed)
Spoke with pt MAR 8. ALL CONCERNS ADDRESSED.

## 2015-04-07 DIAGNOSIS — G3184 Mild cognitive impairment, so stated: Secondary | ICD-10-CM | POA: Diagnosis not present

## 2015-04-28 DIAGNOSIS — M79674 Pain in right toe(s): Secondary | ICD-10-CM | POA: Diagnosis not present

## 2015-04-28 DIAGNOSIS — B351 Tinea unguium: Secondary | ICD-10-CM | POA: Diagnosis not present

## 2015-05-03 DIAGNOSIS — Z79899 Other long term (current) drug therapy: Secondary | ICD-10-CM | POA: Diagnosis not present

## 2015-05-03 DIAGNOSIS — D509 Iron deficiency anemia, unspecified: Secondary | ICD-10-CM | POA: Diagnosis not present

## 2015-05-03 DIAGNOSIS — I10 Essential (primary) hypertension: Secondary | ICD-10-CM | POA: Diagnosis not present

## 2015-05-03 DIAGNOSIS — E559 Vitamin D deficiency, unspecified: Secondary | ICD-10-CM | POA: Diagnosis not present

## 2015-05-03 DIAGNOSIS — N183 Chronic kidney disease, stage 3 (moderate): Secondary | ICD-10-CM | POA: Diagnosis not present

## 2015-05-03 DIAGNOSIS — R809 Proteinuria, unspecified: Secondary | ICD-10-CM | POA: Diagnosis not present

## 2015-05-10 DIAGNOSIS — R809 Proteinuria, unspecified: Secondary | ICD-10-CM | POA: Diagnosis not present

## 2015-05-10 DIAGNOSIS — I10 Essential (primary) hypertension: Secondary | ICD-10-CM | POA: Diagnosis not present

## 2015-05-10 DIAGNOSIS — N183 Chronic kidney disease, stage 3 (moderate): Secondary | ICD-10-CM | POA: Diagnosis not present

## 2015-05-16 DIAGNOSIS — M9903 Segmental and somatic dysfunction of lumbar region: Secondary | ICD-10-CM | POA: Diagnosis not present

## 2015-05-16 DIAGNOSIS — M545 Low back pain: Secondary | ICD-10-CM | POA: Diagnosis not present

## 2015-05-16 DIAGNOSIS — M5136 Other intervertebral disc degeneration, lumbar region: Secondary | ICD-10-CM | POA: Diagnosis not present

## 2015-05-16 DIAGNOSIS — M9905 Segmental and somatic dysfunction of pelvic region: Secondary | ICD-10-CM | POA: Diagnosis not present

## 2015-05-19 DIAGNOSIS — M545 Low back pain: Secondary | ICD-10-CM | POA: Diagnosis not present

## 2015-05-19 DIAGNOSIS — M9903 Segmental and somatic dysfunction of lumbar region: Secondary | ICD-10-CM | POA: Diagnosis not present

## 2015-05-19 DIAGNOSIS — M9902 Segmental and somatic dysfunction of thoracic region: Secondary | ICD-10-CM | POA: Diagnosis not present

## 2015-05-19 DIAGNOSIS — M9905 Segmental and somatic dysfunction of pelvic region: Secondary | ICD-10-CM | POA: Diagnosis not present

## 2015-05-24 DIAGNOSIS — M9902 Segmental and somatic dysfunction of thoracic region: Secondary | ICD-10-CM | POA: Diagnosis not present

## 2015-05-24 DIAGNOSIS — M9903 Segmental and somatic dysfunction of lumbar region: Secondary | ICD-10-CM | POA: Diagnosis not present

## 2015-05-24 DIAGNOSIS — M545 Low back pain: Secondary | ICD-10-CM | POA: Diagnosis not present

## 2015-05-24 DIAGNOSIS — M9905 Segmental and somatic dysfunction of pelvic region: Secondary | ICD-10-CM | POA: Diagnosis not present

## 2015-05-31 DIAGNOSIS — M9905 Segmental and somatic dysfunction of pelvic region: Secondary | ICD-10-CM | POA: Diagnosis not present

## 2015-05-31 DIAGNOSIS — M545 Low back pain: Secondary | ICD-10-CM | POA: Diagnosis not present

## 2015-05-31 DIAGNOSIS — M9903 Segmental and somatic dysfunction of lumbar region: Secondary | ICD-10-CM | POA: Diagnosis not present

## 2015-05-31 DIAGNOSIS — M9902 Segmental and somatic dysfunction of thoracic region: Secondary | ICD-10-CM | POA: Diagnosis not present

## 2015-06-01 DIAGNOSIS — H401132 Primary open-angle glaucoma, bilateral, moderate stage: Secondary | ICD-10-CM | POA: Diagnosis not present

## 2015-06-01 DIAGNOSIS — H35372 Puckering of macula, left eye: Secondary | ICD-10-CM | POA: Diagnosis not present

## 2015-06-01 DIAGNOSIS — H26491 Other secondary cataract, right eye: Secondary | ICD-10-CM | POA: Diagnosis not present

## 2015-06-01 DIAGNOSIS — H5213 Myopia, bilateral: Secondary | ICD-10-CM | POA: Diagnosis not present

## 2015-06-01 DIAGNOSIS — H52223 Regular astigmatism, bilateral: Secondary | ICD-10-CM | POA: Diagnosis not present

## 2015-06-19 DIAGNOSIS — M545 Low back pain: Secondary | ICD-10-CM | POA: Diagnosis not present

## 2015-07-04 DIAGNOSIS — R197 Diarrhea, unspecified: Secondary | ICD-10-CM | POA: Diagnosis not present

## 2015-07-04 DIAGNOSIS — D509 Iron deficiency anemia, unspecified: Secondary | ICD-10-CM | POA: Diagnosis not present

## 2015-07-04 DIAGNOSIS — E782 Mixed hyperlipidemia: Secondary | ICD-10-CM | POA: Diagnosis not present

## 2015-07-04 DIAGNOSIS — M545 Low back pain: Secondary | ICD-10-CM | POA: Diagnosis not present

## 2015-07-05 DIAGNOSIS — D509 Iron deficiency anemia, unspecified: Secondary | ICD-10-CM | POA: Diagnosis not present

## 2015-07-05 DIAGNOSIS — N183 Chronic kidney disease, stage 3 (moderate): Secondary | ICD-10-CM | POA: Diagnosis not present

## 2015-07-05 DIAGNOSIS — F5101 Primary insomnia: Secondary | ICD-10-CM | POA: Diagnosis not present

## 2015-07-05 DIAGNOSIS — I10 Essential (primary) hypertension: Secondary | ICD-10-CM | POA: Diagnosis not present

## 2015-07-07 DIAGNOSIS — B351 Tinea unguium: Secondary | ICD-10-CM | POA: Diagnosis not present

## 2015-07-07 DIAGNOSIS — M79674 Pain in right toe(s): Secondary | ICD-10-CM | POA: Diagnosis not present

## 2015-07-28 DIAGNOSIS — R2243 Localized swelling, mass and lump, lower limb, bilateral: Secondary | ICD-10-CM | POA: Diagnosis not present

## 2015-08-11 ENCOUNTER — Telehealth: Payer: Self-pay | Admitting: Gastroenterology

## 2015-08-11 MED ORDER — PANTOPRAZOLE SODIUM 40 MG PO TBEC: DELAYED_RELEASE_TABLET | ORAL | 3 refills | Status: DC

## 2015-08-11 NOTE — Telephone Encounter (Signed)
I have changed pharmacy to her mail order. Routing to Refill box to send in pantoprazole refill.

## 2015-08-11 NOTE — Telephone Encounter (Signed)
Done

## 2015-08-11 NOTE — Addendum Note (Signed)
Addended by: Orvil Feil on: 08/11/2015 11:15 AM   Modules accepted: Orders

## 2015-08-11 NOTE — Telephone Encounter (Signed)
Pt needs her pantoprazole faxed to her mail order pharmacy.

## 2015-08-17 DIAGNOSIS — D509 Iron deficiency anemia, unspecified: Secondary | ICD-10-CM | POA: Diagnosis not present

## 2015-08-17 DIAGNOSIS — N183 Chronic kidney disease, stage 3 (moderate): Secondary | ICD-10-CM | POA: Diagnosis not present

## 2015-08-17 DIAGNOSIS — Z79899 Other long term (current) drug therapy: Secondary | ICD-10-CM | POA: Diagnosis not present

## 2015-08-17 DIAGNOSIS — E559 Vitamin D deficiency, unspecified: Secondary | ICD-10-CM | POA: Diagnosis not present

## 2015-08-17 DIAGNOSIS — I10 Essential (primary) hypertension: Secondary | ICD-10-CM | POA: Diagnosis not present

## 2015-08-17 DIAGNOSIS — R809 Proteinuria, unspecified: Secondary | ICD-10-CM | POA: Diagnosis not present

## 2015-08-18 DIAGNOSIS — M79673 Pain in unspecified foot: Secondary | ICD-10-CM | POA: Diagnosis not present

## 2015-08-18 DIAGNOSIS — M722 Plantar fascial fibromatosis: Secondary | ICD-10-CM | POA: Diagnosis not present

## 2015-08-18 DIAGNOSIS — M109 Gout, unspecified: Secondary | ICD-10-CM | POA: Diagnosis not present

## 2015-08-23 DIAGNOSIS — N183 Chronic kidney disease, stage 3 (moderate): Secondary | ICD-10-CM | POA: Diagnosis not present

## 2015-08-23 DIAGNOSIS — N250 Renal osteodystrophy: Secondary | ICD-10-CM | POA: Diagnosis not present

## 2015-08-23 DIAGNOSIS — R809 Proteinuria, unspecified: Secondary | ICD-10-CM | POA: Diagnosis not present

## 2015-08-23 DIAGNOSIS — I10 Essential (primary) hypertension: Secondary | ICD-10-CM | POA: Diagnosis not present

## 2015-08-24 NOTE — Telephone Encounter (Signed)
Called patient. SHE HAS BEEN DEALING WITH GOUT. LAST m-PREDNISOLONE TODAY. SENT FRESH N' LEARN TO HER HOUSE FOR DELIVERY NEXT WEEK. NO SHRIMP OR EGG PLANT.

## 2015-09-05 ENCOUNTER — Encounter (INDEPENDENT_AMBULATORY_CARE_PROVIDER_SITE_OTHER): Payer: Self-pay

## 2015-09-05 ENCOUNTER — Ambulatory Visit (INDEPENDENT_AMBULATORY_CARE_PROVIDER_SITE_OTHER): Payer: Medicare Other | Admitting: Obstetrics and Gynecology

## 2015-09-05 ENCOUNTER — Encounter: Payer: Self-pay | Admitting: Obstetrics and Gynecology

## 2015-09-05 VITALS — BP 120/60 | HR 80 | Ht 64.0 in | Wt 150.0 lb

## 2015-09-05 DIAGNOSIS — R351 Nocturia: Secondary | ICD-10-CM | POA: Diagnosis not present

## 2015-09-05 DIAGNOSIS — R350 Frequency of micturition: Secondary | ICD-10-CM | POA: Diagnosis not present

## 2015-09-05 LAB — POCT URINALYSIS DIPSTICK
Glucose, UA: NEGATIVE
Ketones, UA: NEGATIVE
Leukocytes, UA: NEGATIVE
Nitrite, UA: NEGATIVE
PROTEIN UA: NEGATIVE
RBC UA: NEGATIVE

## 2015-09-05 NOTE — Progress Notes (Signed)
[facility] Visit  @DATE @            Patient name: Deborah Farrell MRN [id number]  Date of birth: 05-23-1917  CC & HPI:  Deborah Farrell is a 82 y.o. female presenting today for Complaints of urinary frequency at night. She will wears a panty liner day and night, but has no incontinence during the day. At night she wakes up at least twice. The bathroom and finds this to doctors Artie soaked the has no pain and no sensation of loss of urine control as she makes it to the bathroom she is just concerned that there is "so much urine." She takes thiazide diuretic each morning, chlorthalidone 12.5 mg. She is otherwise doing fine with no weight loss weight gain. She is many years status post hysterectomy  ROS:  ROS See history of present illness  Pertinent History Reviewed:   Reviewed: Significant for Vaginal hysterectomy Medical         Past Medical History:  Diagnosis Date  . Allergy history, radiographic dye   . Arthritis   . Dysuria   . Glaucoma   . HTN (hypertension)   . IBS (irritable bowel syndrome)    Non-ulcer dyspepsia; hemorrhoids  . Overactive bladder   . Tubulovillous adenoma 2007   with multifocal high grade dysplasia-WFBH 2007;TV ADENOMA w/o dysplasia 2008;  SIMPLE ADENOMA 2009; 47mm SIMPLE ADENOMA JAN 2011  . Urinary frequency   . Urinary tract infection                               Surgical Hx:    Past Surgical History:  Procedure Laterality Date  . APPENDECTOMY    . COLONOSCOPY  2008, 2011   diverticulitis diagnosed, 10mm sessile polyp removed inascending colon. area tattood for future observation and reccomended f/u colonoscopy in one year  . COLONOSCOPY N/A 04/24/2012   SLF:The colon IS redundant/ Single RECTAL polyp/Moderate diverticulosis/Small internal hemorrhoids  . OVARIAN CYST REMOVAL    . VAGINAL HYSTERECTOMY     Medications: Reviewed & Updated - see associated section                       Current Outpatient Prescriptions:  .  acetaminophen  (TYLENOL) 500 MG tablet, Take 650 mg by mouth as needed. , Disp: , Rfl:  .  amLODipine-valsartan (EXFORGE) 5-320 MG per tablet, Take 0.5 tablets by mouth daily. , Disp: , Rfl:  .  chlorthalidone (HYGROTON) 25 MG tablet, Take 12.5 mg by mouth daily. Take 1/2 tab, Disp: , Rfl:  .  estradiol (ESTRACE VAGINAL) 0.1 MG/GM vaginal cream, Place 1 Applicatorful vaginally daily. (Patient taking differently: Place 1 Applicatorful vaginally as needed. ), Disp: 4 g, Rfl: 0 .  HYDROcodone-acetaminophen (NORCO/VICODIN) 5-325 MG tablet, TK SS TO ONE T PO BID PRN, Disp: , Rfl: 0 .  latanoprost (XALATAN) 0.005 % ophthalmic solution, Place 1 drop into both eyes at bedtime.  , Disp: , Rfl:  .  Multiple Vitamins-Minerals (MULTI COMPLETE PO), Take 1 tablet by mouth daily. , Disp: , Rfl:  .  pantoprazole (PROTONIX) 40 MG tablet, TAKE 1 BY MOUTH daily, Disp: 90 tablet, Rfl: 3 .  Probiotic Product (ALIGN PO), Take by mouth daily., Disp: , Rfl:  .  prochlorperazine (COMPAZINE) 5 MG tablet, 1-2 PO DAILY TO PREVENT NAUSEA, Disp: 60 tablet, Rfl: 5 .  carvedilol (COREG) 12.5 MG tablet, Take 1  tablet (12.5 mg total) by mouth 2 (two) times daily with a meal., Disp: 180 tablet, Rfl: 3   Social History: Reviewed -  reports that she has never smoked. She has never used smokeless tobacco.  Objective Findings:  Vitals: Blood pressure 120/60, pulse 80, height 5\' 4"  (1.626 m), weight 150 lb (68 kg).  Physical Examination: General appearance - alert, well appearing, and in no distress, oriented to person, place, and time, normal appearing weight and well hydrated Mental status - alert, oriented to person, place, and time, normal mood, behavior, speech, dress, motor activity, and thought processes Eyes - pupils equal and reactive, extraocular eye movements intact, sclera anicteric Abdomen - soft, nontender, nondistended, no masses or organomegaly Pelvic - VULVA: normal appearing vulva with no masses, tenderness or lesions, Valsalva  shows good support,  VAGINA: normal appearing vagina with normal color and discharge, no lesions, PELVIC FLOOR EXAM: no cystocele, rectocele or prolapse noted, this presence of the vaginal estrogen cream in the vagina with healthy-appearing tissues, CERVIX: normal appearing cervix without discharge or lesions, surgically absent, UTERUS: surgically absent, vaginal cuff well healed, ADNEXA: normal adnexa in size, nontender and no masses, bladder fullness noted, no palpable internal organs Neurological - alert, oriented, normal speech, no focal findings or movement disorder noted, gait normal strength 5 over 5   Assessment & Plan:   A:  1. Urinary incontinence at night, well controlled with current regimen with the pins  P:  1. I would be hesitant to use any anti-incontinence medications on her for concern over side effects. She is doing amazingly well has no vulvar irritation from the urine loss. It's hard improvement in her overall condition and I will discuss with Dr. Nevada Crane, sending him a copy of today's note if Dr. Nevada Crane wishes to consider a trial of an anti-spasmodic such as Ditropan 5 mg at bedtime, or even 2.5 mg, that might be a reasonable clinical trial but should be performed by the primary medical provider, Dr. Nevada Crane

## 2015-09-07 ENCOUNTER — Ambulatory Visit: Payer: Medicare Other | Admitting: Adult Health

## 2015-09-19 DIAGNOSIS — M79672 Pain in left foot: Secondary | ICD-10-CM | POA: Diagnosis not present

## 2015-10-15 ENCOUNTER — Other Ambulatory Visit: Payer: Self-pay | Admitting: Gastroenterology

## 2015-10-16 DIAGNOSIS — Z23 Encounter for immunization: Secondary | ICD-10-CM | POA: Diagnosis not present

## 2015-10-16 DIAGNOSIS — M5442 Lumbago with sciatica, left side: Secondary | ICD-10-CM | POA: Diagnosis not present

## 2015-10-16 DIAGNOSIS — M545 Low back pain: Secondary | ICD-10-CM | POA: Diagnosis not present

## 2015-10-16 NOTE — Telephone Encounter (Signed)
Please clarify if patient is still taking medication as the last phone note states she stopped it due to concerns for side effect. s

## 2015-10-16 NOTE — Telephone Encounter (Signed)
I called pt and she said she is still taking the Compazine. ( She never truly stopped it) and it helps her nausea. She has sent in a request for refill).

## 2015-10-16 NOTE — Telephone Encounter (Signed)
Pt called back and said she just picked up #60 pills of the compazine this AM at [facility].

## 2015-11-01 DIAGNOSIS — R2681 Unsteadiness on feet: Secondary | ICD-10-CM | POA: Diagnosis not present

## 2015-11-01 DIAGNOSIS — R2689 Other abnormalities of gait and mobility: Secondary | ICD-10-CM | POA: Diagnosis not present

## 2015-11-01 DIAGNOSIS — M6281 Muscle weakness (generalized): Secondary | ICD-10-CM | POA: Diagnosis not present

## 2015-11-01 DIAGNOSIS — M545 Low back pain: Secondary | ICD-10-CM | POA: Diagnosis not present

## 2015-11-03 DIAGNOSIS — R2689 Other abnormalities of gait and mobility: Secondary | ICD-10-CM | POA: Diagnosis not present

## 2015-11-03 DIAGNOSIS — M545 Low back pain: Secondary | ICD-10-CM | POA: Diagnosis not present

## 2015-11-03 DIAGNOSIS — M6281 Muscle weakness (generalized): Secondary | ICD-10-CM | POA: Diagnosis not present

## 2015-11-03 DIAGNOSIS — R2681 Unsteadiness on feet: Secondary | ICD-10-CM | POA: Diagnosis not present

## 2015-11-05 ENCOUNTER — Encounter (HOSPITAL_COMMUNITY): Payer: Self-pay | Admitting: Emergency Medicine

## 2015-11-05 ENCOUNTER — Emergency Department (HOSPITAL_COMMUNITY)
Admission: EM | Admit: 2015-11-05 | Discharge: 2015-11-05 | Disposition: A | Discharge status: Home / Self Care | Payer: Medicare Other | Attending: Emergency Medicine | Admitting: Emergency Medicine

## 2015-11-05 DIAGNOSIS — I10 Essential (primary) hypertension: Secondary | ICD-10-CM | POA: Diagnosis not present

## 2015-11-05 DIAGNOSIS — Z79899 Other long term (current) drug therapy: Secondary | ICD-10-CM | POA: Insufficient documentation

## 2015-11-05 DIAGNOSIS — M79674 Pain in right toe(s): Secondary | ICD-10-CM

## 2015-11-05 NOTE — Discharge Instructions (Signed)
YOur vital signs are within normal limits. No sign of infection in the toe today. No evidence of silent fracture. Please continue the warm tub soaks and the your liniment/rub. Please see the podiatrist listed above or the podiatrist of your choice for follow up and recheck.

## 2015-11-05 NOTE — ED Provider Notes (Signed)
[facility] DEPT Provider Note   CSN: [id number] Arrival date & time: 11/05/15  [id number]     History   Chief Complaint Chief Complaint  Patient presents with  . Toe Pain    HPI Deborah Farrell is a 82 y.o. female.  Patient is a 82 year old female who presents to the emergency department with a complaint of toe pain.  The patient states that she has a history of gout. She has been using warm Epsom salt soaks and absorbing horse liniment on her great toes for over a month on. Recently she's been noticing some mild redness at the base of the nail of her right big toe. She says that it is sore. She is concerned because of her age of it becoming a problem. She presents to the emergency department for evaluation and management. No recent injury. No recent operation or procedure involving the right lower extremity.   The history is provided by the patient.  Toe Pain  Pertinent negatives include no chest pain, no abdominal pain and no shortness of breath.    Past Medical History:  Diagnosis Date  . Allergy history, radiographic dye   . Arthritis   . Dysuria   . Glaucoma   . HTN (hypertension)   . IBS (irritable bowel syndrome)    Non-ulcer dyspepsia; hemorrhoids  . Overactive bladder   . 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 Urinary frequency   . Urinary tract infection     Patient Active Problem List   Diagnosis Date Noted  . Nocturia 07/27/2014  . Recurrent cystitis 07/08/2013  . Atrophic vaginitis 07/08/2013  . Internal hemorrhoids with complication [id number]  . Stiffness of joints, not elsewhere classified, multiple sites 11/19/2012  . Poor balance 11/19/2012  . Hip pain, right 11/20/2011  . Back pain, lumbosacral 11/20/2011  . Dyspepsia 07/19/2011  . Tubulovillous adenoma   . HYPERLIPIDEMIA 08/20/2007  . GLAUCOMA 08/20/2007  . HYPERTENSION  08/20/2007  . Gastroesophageal reflux disease 08/20/2007  . IRRITABLE BOWEL SYNDROME 08/20/2007    Past Surgical History:  Procedure Laterality Date  . APPENDECTOMY    . COLONOSCOPY  2008, 2011   diverticulitis diagnosed, 61mm sessile polyp removed inascending colon. area tattood for future observation and reccomended f/u colonoscopy in one year  . COLONOSCOPY N/A 04/24/2012   SLF:The colon IS redundant/ Single RECTAL polyp/Moderate diverticulosis/Small internal hemorrhoids  . OVARIAN CYST REMOVAL    . VAGINAL HYSTERECTOMY      OB History    Gravida Para Term Preterm AB Living   0 0 0 0 0 0   SAB TAB Ectopic Multiple Live Births   0 0 0 0 0       Home Medications    Prior to Admission medications   Medication Sig Start Date End Date Taking? Authorizing Provider  acetaminophen (TYLENOL) 500 MG tablet Take 650 mg by mouth as needed.    Yes Historical Provider, MD  amLODipine-valsartan (EXFORGE) 5-320 MG per tablet Take 0.5 tablets by mouth daily.    Yes Historical Provider, MD  carvedilol (COREG) 12.5 MG tablet Take 1 tablet (12.5 mg total) by mouth 2 (two) times daily with a meal. Patient taking differently: Take 6.25 mg by mouth 2 (two) times daily with a meal.  05/18/14 11/05/15 Yes Arnoldo Lenis, MD  chlorthalidone (HYGROTON) 25 MG tablet Take 12.5 mg by mouth daily. Take 1/2 tab  Yes Historical Provider, MD  estradiol (ESTRACE VAGINAL) 0.1 MG/GM vaginal cream Place 1 Applicatorful vaginally daily. Patient taking differently: Place 1 Applicatorful vaginally as needed.  02/23/15  Yes Jonnie Kind, MD  latanoprost (XALATAN) 0.005 % ophthalmic solution Place 1 drop into both eyes at bedtime.     Yes Historical Provider, MD  Multiple Vitamins-Minerals (MULTI COMPLETE PO) Take 1 tablet by mouth daily.    Yes Historical Provider, MD  pantoprazole (PROTONIX) 40 MG tablet TAKE 1 BY MOUTH daily 08/11/15  Yes Annitta Needs, NP  Probiotic Product (ALIGN PO) Take 1 tablet by mouth daily.     Yes Historical Provider, MD  prochlorperazine (COMPAZINE) 5 MG tablet TAKE 1 TO 2 TABLETS BY MOUTH DAILY TO PREVENT NAUSEA 10/18/15  Yes Mahala Menghini, PA-C    Family History Family History  Problem Relation Age of Onset  . Stroke Mother   . Heart attack Sister   . Heart disease Sister   . Cancer Maternal Grandfather     Social History Social History  Substance Use Topics  . Smoking status: Never Smoker  . Smokeless tobacco: Never Used  . Alcohol use No     Allergies   Aciphex [rabeprazole]; Colchicine; Dicyclomine hcl; Hyoscyamine sulfate; Iodinated diagnostic agents; Nexium [esomeprazole magnesium]; Omeprazole; and Sulfonamide derivatives   Review of Systems Review of Systems  Constitutional: Negative for activity change.       All ROS Neg except as noted in HPI  HENT: Negative for nosebleeds.   Eyes: Negative for photophobia and discharge.  Respiratory: Negative for cough, shortness of breath and wheezing.   Cardiovascular: Negative for chest pain and palpitations.  Gastrointestinal: Negative for abdominal pain and blood in stool.  Genitourinary: Negative for dysuria, frequency and hematuria.  Musculoskeletal: Positive for arthralgias. Negative for back pain and neck pain.  Skin: Negative.   Neurological: Negative for dizziness, seizures and speech difficulty.  Psychiatric/Behavioral: Negative for confusion and hallucinations.     Physical Exam Updated Vital Signs BP 121/62 (BP Location: Left Arm)   Pulse 63   Temp 98.1 F (36.7 C) (Oral)   Resp 18   Ht 5\' 4"  (1.626 m)   Wt 68 kg   SpO2 98%   BMI 25.75 kg/m   Physical Exam  Constitutional: Vital signs are normal. She appears well-developed and well-nourished. She is active.  HENT:  Head: Normocephalic and atraumatic.  Right Ear: Tympanic membrane and ear canal normal.  Left Ear: Tympanic membrane and ear canal normal.  Nose: Nose normal.  Mouth/Throat: Uvula is midline, oropharynx is clear and  moist and mucous membranes are normal.  Eyes: Conjunctivae, EOM and lids are normal. Pupils are equal, round, and reactive to light.  Neck: Trachea normal, normal range of motion and phonation normal. Neck supple. Carotid bruit is not present.  Cardiovascular: Normal rate, regular rhythm and normal pulses.   Abdominal: Soft. Normal appearance and bowel sounds are normal.  Musculoskeletal:  There is good capillary refill noted of the right lower extremity. There is no unusual swelling of the right foot, and in particular there is no swelling of the great toe. There is mild soreness at the base of the nail of the right great toe. No drainage appreciated. No red streaks noted. There is good range of motion of the PIP, and the MP joint on. The toe is not hot.  Lymphadenopathy:       Head (right side): No submental, no preauricular and no posterior auricular adenopathy present.  Head (left side): No submental, no preauricular and no posterior auricular adenopathy present.    She has no cervical adenopathy.  Neurological: She is alert. She has normal strength. No cranial nerve deficit or sensory deficit. GCS eye subscore is 4. GCS verbal subscore is 5. GCS motor subscore is 6.  Skin: Skin is warm and dry.  Psychiatric: Her speech is normal.     ED Treatments / Results  Labs (all labs ordered are listed, but only abnormal results are displayed) Labs Reviewed - No data to display  EKG  EKG Interpretation None       Radiology No results found.  Procedures Procedures (including critical care time)  Medications Ordered in ED Medications - No data to display   Initial Impression / Assessment and Plan / ED Course  Pt seen with me by Dr Oleta Mouse.  I have reviewed the triage vital signs and the nursing notes.  Pertinent labs & imaging results that were available during my care of the patient were reviewed by me and considered in my medical decision making (see chart for  details).  Clinical Course    **I have reviewed nursing notes, vital signs, and all appropriate lab and imaging results for this patient.*  Final Clinical Impressions(s) / ED Diagnoses  Vital signs within normal limits. There no signs of infection. There no signs of occult fracture or dislocation. The patient is referred to podiatry for additional evaluation, especially considering the increased thickening of the nails of both feet. The patient is invited to return to the emergency department if any changes, problems, or concerns.    Final diagnoses:  Toe pain, right    New Prescriptions New Prescriptions   No medications on file     Lily Kocher, PA-C 11/05/15 Bolivar Liu, MD 11/05/15 [phone]

## 2015-11-05 NOTE — ED Notes (Signed)
Patient walked to the bathroom with minimal assistance.  

## 2015-11-05 NOTE — ED Triage Notes (Signed)
Pt reports hx of gout in toes and has been using Absorbine horse liniment on her great toes for over a month.  The right one seems to be very sore at base of nail.  Denies injury.

## 2015-11-06 DIAGNOSIS — R2681 Unsteadiness on feet: Secondary | ICD-10-CM | POA: Diagnosis not present

## 2015-11-06 DIAGNOSIS — M545 Low back pain: Secondary | ICD-10-CM | POA: Diagnosis not present

## 2015-11-06 DIAGNOSIS — R2689 Other abnormalities of gait and mobility: Secondary | ICD-10-CM | POA: Diagnosis not present

## 2015-11-06 DIAGNOSIS — M6281 Muscle weakness (generalized): Secondary | ICD-10-CM | POA: Diagnosis not present

## 2015-11-08 DIAGNOSIS — R2681 Unsteadiness on feet: Secondary | ICD-10-CM | POA: Diagnosis not present

## 2015-11-08 DIAGNOSIS — R2689 Other abnormalities of gait and mobility: Secondary | ICD-10-CM | POA: Diagnosis not present

## 2015-11-08 DIAGNOSIS — Z23 Encounter for immunization: Secondary | ICD-10-CM | POA: Diagnosis not present

## 2015-11-08 DIAGNOSIS — M6281 Muscle weakness (generalized): Secondary | ICD-10-CM | POA: Diagnosis not present

## 2015-11-08 DIAGNOSIS — M545 Low back pain: Secondary | ICD-10-CM | POA: Diagnosis not present

## 2015-11-10 DIAGNOSIS — R2681 Unsteadiness on feet: Secondary | ICD-10-CM | POA: Diagnosis not present

## 2015-11-10 DIAGNOSIS — B351 Tinea unguium: Secondary | ICD-10-CM | POA: Diagnosis not present

## 2015-11-10 DIAGNOSIS — R2689 Other abnormalities of gait and mobility: Secondary | ICD-10-CM | POA: Diagnosis not present

## 2015-11-10 DIAGNOSIS — M545 Low back pain: Secondary | ICD-10-CM | POA: Diagnosis not present

## 2015-11-10 DIAGNOSIS — M6281 Muscle weakness (generalized): Secondary | ICD-10-CM | POA: Diagnosis not present

## 2015-11-13 DIAGNOSIS — R2689 Other abnormalities of gait and mobility: Secondary | ICD-10-CM | POA: Diagnosis not present

## 2015-11-13 DIAGNOSIS — M545 Low back pain: Secondary | ICD-10-CM | POA: Diagnosis not present

## 2015-11-13 DIAGNOSIS — R2681 Unsteadiness on feet: Secondary | ICD-10-CM | POA: Diagnosis not present

## 2015-11-13 DIAGNOSIS — M6281 Muscle weakness (generalized): Secondary | ICD-10-CM | POA: Diagnosis not present

## 2015-11-15 DIAGNOSIS — R2681 Unsteadiness on feet: Secondary | ICD-10-CM | POA: Diagnosis not present

## 2015-11-15 DIAGNOSIS — M6281 Muscle weakness (generalized): Secondary | ICD-10-CM | POA: Diagnosis not present

## 2015-11-15 DIAGNOSIS — R2689 Other abnormalities of gait and mobility: Secondary | ICD-10-CM | POA: Diagnosis not present

## 2015-11-15 DIAGNOSIS — M545 Low back pain: Secondary | ICD-10-CM | POA: Diagnosis not present

## 2015-11-17 DIAGNOSIS — R2681 Unsteadiness on feet: Secondary | ICD-10-CM | POA: Diagnosis not present

## 2015-11-17 DIAGNOSIS — M6281 Muscle weakness (generalized): Secondary | ICD-10-CM | POA: Diagnosis not present

## 2015-11-17 DIAGNOSIS — M545 Low back pain: Secondary | ICD-10-CM | POA: Diagnosis not present

## 2015-11-17 DIAGNOSIS — R2689 Other abnormalities of gait and mobility: Secondary | ICD-10-CM | POA: Diagnosis not present

## 2015-11-20 ENCOUNTER — Other Ambulatory Visit: Payer: Self-pay

## 2015-11-20 DIAGNOSIS — M6281 Muscle weakness (generalized): Secondary | ICD-10-CM | POA: Diagnosis not present

## 2015-11-20 DIAGNOSIS — R2681 Unsteadiness on feet: Secondary | ICD-10-CM | POA: Diagnosis not present

## 2015-11-20 DIAGNOSIS — R2689 Other abnormalities of gait and mobility: Secondary | ICD-10-CM | POA: Diagnosis not present

## 2015-11-20 DIAGNOSIS — M545 Low back pain: Secondary | ICD-10-CM | POA: Diagnosis not present

## 2015-11-20 MED ORDER — CARVEDILOL 12.5 MG PO TABS: 12.5000 mg | ORAL_TABLET | Freq: Two times a day (BID) | ORAL | 0 refills | Status: DC

## 2015-11-20 NOTE — Telephone Encounter (Signed)
Received refill form from [employer] service ,changed to that pharmacy MD ordered coreg 12.5 mg BID

## 2015-11-22 DIAGNOSIS — M6281 Muscle weakness (generalized): Secondary | ICD-10-CM | POA: Diagnosis not present

## 2015-11-22 DIAGNOSIS — R2689 Other abnormalities of gait and mobility: Secondary | ICD-10-CM | POA: Diagnosis not present

## 2015-11-22 DIAGNOSIS — M545 Low back pain: Secondary | ICD-10-CM | POA: Diagnosis not present

## 2015-11-22 DIAGNOSIS — R2681 Unsteadiness on feet: Secondary | ICD-10-CM | POA: Diagnosis not present

## 2015-11-23 ENCOUNTER — Encounter: Payer: Self-pay | Admitting: Adult Health

## 2015-11-23 ENCOUNTER — Ambulatory Visit (INDEPENDENT_AMBULATORY_CARE_PROVIDER_SITE_OTHER): Payer: Medicare Other | Admitting: Adult Health

## 2015-11-23 VITALS — BP 118/58 | HR 70 | Ht 64.0 in | Wt 150.0 lb

## 2015-11-23 DIAGNOSIS — I10 Essential (primary) hypertension: Secondary | ICD-10-CM

## 2015-11-23 DIAGNOSIS — I059 Rheumatic mitral valve disease, unspecified: Secondary | ICD-10-CM | POA: Diagnosis not present

## 2015-11-23 NOTE — Progress Notes (Signed)
Name: Deborah Farrell    DOB: 1917/10/18  Age: 82 y.o.  MR#: [id number]       PCP:  Wende Neighbors, MD      Insurance: Payor: BLUE CROSS BLUE SHIELD MEDICARE / Plan: BCBS MEDICARE / Product Type: *No Product type* /   CC:   No chief complaint on file.   VS Vitals:   11/23/15 1503  Weight: 150 lb (68 kg)  Height: '5\' 4"'$  (1.626 m)    Weights Current Weight  11/23/15 150 lb (68 kg)  11/05/15 150 lb (68 kg)  09/05/15 150 lb (68 kg)    Blood Pressure  BP Readings from Last 3 Encounters:  11/05/15 126/63  09/05/15 120/60  03/08/15 117/62     Admit date:  (Not on file) Last encounter with RMR:  Visit date not found   Allergy Aciphex [rabeprazole]; Colchicine; Dicyclomine hcl; Hyoscyamine sulfate; Iodinated diagnostic agents; Nexium [esomeprazole magnesium]; Omeprazole; and Sulfonamide derivatives  Current Outpatient Prescriptions  Medication Sig Dispense Refill  . acetaminophen (TYLENOL) 500 MG tablet Take 650 mg by mouth as needed.     Marland Kitchen amLODipine-valsartan (EXFORGE) 5-320 MG per tablet Take 0.5 tablets by mouth daily.     . carvedilol (COREG) 12.5 MG tablet Take 1 tablet (12.5 mg total) by mouth 2 (two) times daily with a meal. 180 tablet 0  . chlorthalidone (HYGROTON) 25 MG tablet Take 12.5 mg by mouth daily. Take 1/2 tab    . estradiol (ESTRACE VAGINAL) 0.1 MG/GM vaginal cream Place 1 Applicatorful vaginally daily. (Patient taking differently: Place 1 Applicatorful vaginally as needed. ) 4 g 0  . latanoprost (XALATAN) 0.005 % ophthalmic solution Place 1 drop into both eyes at bedtime.      . Multiple Vitamins-Minerals (MULTI COMPLETE PO) Take 1 tablet by mouth daily.     . pantoprazole (PROTONIX) 40 MG tablet TAKE 1 BY MOUTH daily 90 tablet 3  . Probiotic Product (ALIGN PO) Take 1 tablet by mouth daily.     . prochlorperazine (COMPAZINE) 5 MG tablet TAKE 1 TO 2 TABLETS BY MOUTH DAILY TO PREVENT NAUSEA 60 tablet 3   No current facility-administered medications for this visit.      Discontinued Meds:   There are no discontinued medications.  Patient Active Problem List   Diagnosis Date Noted  . Nocturia 07/27/2014  . Recurrent cystitis 07/08/2013  . Atrophic vaginitis 07/08/2013  . Internal hemorrhoids with complication 32/67/1245  . Stiffness of joints, not elsewhere classified, multiple sites 11/19/2012  . Poor balance 11/19/2012  . Hip pain, right 11/20/2011  . Back pain, lumbosacral 11/20/2011  . Dyspepsia 07/19/2011  . Tubulovillous adenoma   . HYPERLIPIDEMIA 08/20/2007  . GLAUCOMA 08/20/2007  . HYPERTENSION 08/20/2007  . Gastroesophageal reflux disease 08/20/2007  . IRRITABLE BOWEL SYNDROME 08/20/2007    LABS    Component Value Date/Time   NA 135 11/03/2011 0000   NA 140 05/20/2011 1417   NA 140 12/24/2010 0835   K 3.9 11/03/2011 0000   K 4.3 05/20/2011 1417   K 4.9 12/24/2010 0835   CL 99 11/03/2011 0000   CL 100 05/20/2011 1417   CL 103 12/24/2010 0835   CO2 26 11/03/2011 0000   CO2 28 05/20/2011 1417   CO2 27 12/24/2010 0835   GLUCOSE 113 (H) 11/03/2011 0000   GLUCOSE 85 05/20/2011 1417   GLUCOSE 96 12/24/2010 0835   BUN 36 (H) 11/03/2011 0000   BUN 26 (H) 05/20/2011 1417   BUN 36 (H) 12/24/2010  7253   CREATININE 1.49 (H) 11/03/2011 0000   CREATININE 1.04 05/20/2011 1417   CREATININE 1.19 (H) 12/24/2010 0835   CREATININE 1.33 (H) 11/07/2010 0925   CALCIUM 9.8 11/03/2011 0000   CALCIUM 10.5 05/20/2011 1417   CALCIUM 9.9 12/24/2010 0835   GFRNONAA 29 (L) 11/03/2011 0000   GFRNONAA 53 (L) 12/05/2009 1531   GFRNONAA 29 (L) 01/05/2008 1350   GFRAA 34 (L) 11/03/2011 0000   GFRAA  12/05/2009 1531    >60        The eGFR has been calculated using the MDRD equation. This calculation has not been validated in all clinical situations. eGFR's persistently <60 mL/min signify possible Chronic Kidney Disease.   GFRAA (L) 01/05/2008 1350    35        The eGFR has been calculated using the MDRD equation. This calculation has not  been validated in all clinical   CMP     Component Value Date/Time   NA 135 11/03/2011 0000   K 3.9 11/03/2011 0000   CL 99 11/03/2011 0000   CO2 26 11/03/2011 0000   GLUCOSE 113 (H) 11/03/2011 0000   BUN 36 (H) 11/03/2011 0000   CREATININE 1.49 (H) 11/03/2011 0000   CREATININE 1.04 05/20/2011 1417   CALCIUM 9.8 11/03/2011 0000   PROT 8.0 05/20/2011 1417   ALBUMIN 4.7 05/20/2011 1417   AST 20 05/20/2011 1417   ALT 13 05/20/2011 1417   ALKPHOS 49 05/20/2011 1417   BILITOT 0.5 05/20/2011 1417   GFRNONAA 29 (L) 11/03/2011 0000   GFRAA 34 (L) 11/03/2011 0000       Component Value Date/Time   WBC 8.3 11/03/2011 0000   WBC 8.7 05/20/2011 1417   WBC 9.3 01/05/2008 1350   HGB 10.6 (L) 11/03/2011 0000   HGB 12.1 05/20/2011 1417   HGB 10.4 (L) 01/05/2008 1350   HCT 31.8 (L) 11/03/2011 0000   HCT 37.3 05/20/2011 1417   HCT 30.8 (L) 01/05/2008 1350   MCV 95.8 11/03/2011 0000   MCV 95.9 05/20/2011 1417   MCV 95.3 01/05/2008 1350    Lipid Panel     Component Value Date/Time   CHOL 208 (H) 12/24/2010 0835   TRIG 122 12/24/2010 0835   HDL 48 12/24/2010 0835   CHOLHDL 4.3 12/24/2010 0835   VLDL 24 12/24/2010 0835   LDLCALC 136 (H) 12/24/2010 0835    ABG No results found for: PHART, PCO2ART, PO2ART, HCO3, TCO2, ACIDBASEDEF, O2SAT   No results found for: TSH BNP (last 3 results) No results for input(s): BNP in the last 8760 hours.  ProBNP (last 3 results) No results for input(s): PROBNP in the last 8760 hours.  Cardiac Panel (last 3 results) No results for input(s): CKTOTAL, CKMB, TROPONINI, RELINDX in the last 72 hours.  Iron/TIBC/Ferritin/ %Sat No results found for: IRON, TIBC, FERRITIN, IRONPCTSAT   EKG Orders placed or performed in visit on 11/23/15  . EKG 12-Lead     Prior Assessment and Plan Problem List as of 11/23/2015 Reviewed: 03/08/2015 11:58 AM by Barney Drain, MD     Cardiovascular and Mediastinum   HYPERTENSION   Last Assessment & Plan 04/28/2012  Office Visit Written 04/28/2012  2:30 PM by Lendon Colonel, NP    Essentially good control of blood pressure. She states it is usually much lower at home and in her primary care physician office. She is medically compliant. Authorize refills on all antihypertensives. We will see her again in one year unless she becomes symptomatic.  Internal hemorrhoids with complication   Last Assessment & Plan 08/04/2014 Office Visit Written 08/04/2014  9:51 AM by Danie Binder, MD    SYMPTOMS CONTROLLED/RESOLVED.  CONTINUE TO MONITOR SYMPTOMS.         Digestive   Gastroesophageal reflux disease   Last Assessment & Plan 08/04/2014 Office Visit Written 08/04/2014  9:50 AM by Danie Binder, MD    SYMPTOMS CONTROLLED/RESOLVED ON [facility]. BID DOSING CAUSED LOOSE STOOLS.  PROTONIX DAILY CONTINUE TO MONITOR SYMPTOMS.       IRRITABLE BOWEL SYNDROME   Last Assessment & Plan 12/30/2013 Office Visit Written 12/30/2013  3:37 PM by Danie Binder, MD    Sx incomplete evacuation.  ADD FIBER POWDER  CALL WITH QUESTIONS OR CONCERNS IF POWDER DOESN'T WORK FOLLOW UP IN 4 MOS.          Genitourinary   Recurrent cystitis   Atrophic vaginitis     Other   HYPERLIPIDEMIA   Last Assessment & Plan 04/28/2012 Office Visit Written 04/28/2012  2:30 PM by Lendon Colonel, NP    She is recently had lab work completed by Dr. Nevada Crane last week. No changes in her medication regimen.      GLAUCOMA   Tubulovillous adenoma   Last Assessment & Plan 08/04/2014 Office Visit Edited 08/04/2014  9:52 AM by Danie Binder, MD    NO INDICATION FOR ENDOSCOPY AT THIS TIME.  Next TCS in 2019 if THE benefits outweigh the risks.      Dyspepsia   Last Assessment & Plan 03/08/2015 Office Visit Edited 03/08/2015 12:00 PM by Danie Binder, MD    SYMPTOMS FAIRLY WELL CONTROLLED BUT ASSOCIATED WITH ANXIETY, WEIGHT LOSS AND TROUBLE ACCESSING FOOD.  FOLLOW A HIGH FIBER DIET. AVOID ITEMS THAT CAUSE BLOATING &  GAS. HIGH FIBER DIET COMPAZINE PRN PROBIOTIC DAILY FOLLOW UP IN 3 MOS.   GREATER THAN 50% WAS SPENT IN COUNSELING & COORDINATION OF CARE WITH THE PATIENT: DISCUSSED  BENEFITS,  AND MANAGEMENT OF DYSPEPSAI AND FIGURING OUT HOW TO GET PT FOOD AND CLEANING SERVICES. TOTAL ENCOUNTER TIME: 15 MINS.       Hip pain, right   Last Assessment & Plan 11/20/2011 Office Visit Written 11/20/2011  9:29 AM by Danie Binder, MD    XRAYS AND REFER TO DR. HARRISON.      Back pain, lumbosacral   Last Assessment & Plan 11/20/2011 Office Visit Written 11/20/2011  9:30 AM by Danie Binder, MD    DIFFERENTIAL DIAGNOSIS: DDD, AVASCULAR NECROSIS, SI JOINT INFLAMMATION.  XRAYS AND REFER TO DR. HARRISON.      Stiffness of joints, not elsewhere classified, multiple sites   Poor balance   Nocturia       Imaging: No results found.

## 2015-11-23 NOTE — Patient Instructions (Signed)
Your physician recommends that you schedule a follow-up appointment in: 4 months with Dr Harl Bowie    Your physician recommends that you continue on your current medications as directed. Please refer to the Current Medication list given to you today.     Your physician has requested that you have an echocardiogram. Echocardiography is a painless test that uses sound waves to create images of your heart. It provides your doctor with information about the size and shape of your heart and how well your heart's chambers and valves are working. This procedure takes approximately one hour. There are no restrictions for this procedure.      Thank you for choosing [facility] !

## 2015-11-23 NOTE — Progress Notes (Signed)
Cardiology Office Note   Date:  11/23/2015   ID:  Deborah Farrell, DOB July 18, 1917, MRN [id number]  PCP:  Wende Neighbors, MD  Cardiologist: Cloria Spring, NP   Chief Complaint  Patient presents with  . Hypertension      History of Present Illness: Deborah Farrell is a 82 y.o. female who presents for ongoing assessment management of hypertension, and hyperlipidemia. The patient was last seen by Dr. Harl Bowie in April 2016. Other history includes ear bowel syndrome, arthritis, and dysuria area. On last office visit the patient's blood pressure was well-controlled. A follow-up echocardiogram was ordered. There is no record of her completing this procedure.  She is here today with chronic lower extremity pain and back pain related to arthritis. She denies any chest pain dizziness or dyspnea on exertion. She admits that she did not go and have echocardiogram.    Past Medical History:  Diagnosis Date  . Allergy history, radiographic dye   . Arthritis   . Dysuria   . Glaucoma   . HTN (hypertension)   . IBS (irritable bowel syndrome)    Non-ulcer dyspepsia; hemorrhoids  . Overactive bladder   . Tubulovillous adenoma 2007   with multifocal high grade dysplasia-WFBH 2007;TV ADENOMA w/o dysplasia 2008;  SIMPLE ADENOMA 2009; 46mm SIMPLE ADENOMA JAN 2011  . Urinary frequency   . Urinary tract infection     Past Surgical History:  Procedure Laterality Date  . APPENDECTOMY    . COLONOSCOPY  2008, 2011   diverticulitis diagnosed, 58mm sessile polyp removed inascending colon. area tattood for future observation and reccomended f/u colonoscopy in one year  . COLONOSCOPY N/A 04/24/2012   SLF:The colon IS redundant/ Single RECTAL polyp/Moderate diverticulosis/Small internal hemorrhoids  . OVARIAN CYST REMOVAL    . VAGINAL HYSTERECTOMY       Current Outpatient Prescriptions  Medication Sig Dispense Refill  . acetaminophen (TYLENOL) 500 MG tablet Take 650 mg by mouth as needed.     Marland Kitchen  amLODipine-valsartan (EXFORGE) 5-320 MG per tablet Take 0.5 tablets by mouth daily.     . carvedilol (COREG) 12.5 MG tablet Take 1 tablet (12.5 mg total) by mouth 2 (two) times daily with a meal. 180 tablet 0  . chlorthalidone (HYGROTON) 25 MG tablet Take 12.5 mg by mouth daily. Take 1/2 tab    . estradiol (ESTRACE VAGINAL) 0.1 MG/GM vaginal cream Place 1 Applicatorful vaginally daily. (Patient taking differently: Place 1 Applicatorful vaginally as needed. ) 4 g 0  . latanoprost (XALATAN) 0.005 % ophthalmic solution Place 1 drop into both eyes at bedtime.      . Multiple Vitamins-Minerals (MULTI COMPLETE PO) Take 1 tablet by mouth daily.     . pantoprazole (PROTONIX) 40 MG tablet TAKE 1 BY MOUTH daily 90 tablet 3  . Probiotic Product (ALIGN PO) Take 1 tablet by mouth daily.     . prochlorperazine (COMPAZINE) 5 MG tablet TAKE 1 TO 2 TABLETS BY MOUTH DAILY TO PREVENT NAUSEA 60 tablet 3   No current facility-administered medications for this visit.     Allergies:   Aciphex [rabeprazole]; Colchicine; Dicyclomine hcl; Hyoscyamine sulfate; Iodinated diagnostic agents; Nexium [esomeprazole magnesium]; Omeprazole; and Sulfonamide derivatives    Social History:  The patient  reports that she has never smoked. She has never used smokeless tobacco. She reports that she does not drink alcohol or use drugs.   Family History:  The patient's family history includes Cancer in her maternal grandfather; Heart attack in her  sister; Heart disease in her sister; Stroke in her mother.    ROS: All other systems are reviewed and negative. Unless otherwise mentioned in H&P    PHYSICAL EXAM: VS:  BP (!) 118/58   Pulse 70   Ht 5\' 4"  (1.626 m)   Wt 150 lb (68 kg)   SpO2 96%   BMI 25.75 kg/m  , BMI Body mass index is 25.75 kg/m. GEN: Well nourished, well developed, in no acute distress  HEENT: normal  Neck: no JVD, carotid bruits, or masses Cardiac: RRR; no murmurs, rubs, or gallops,no edema  Respiratory:   clear to auscultation bilaterally, normal work of breathing GI: soft, nontender, nondistended, + BS MS: no deformity or atrophy  Skin: warm and dry, no rash Neuro:  Strength and sensation are intact Psych: euthymic mood, full affect   EKG:  The ekg ordered today demonstrates sinus rhythm rate of 61 bpm with T-wave flattening in the lateral leads.   Recent Labs: No results found for requested labs within last 8760 hours.    Lipid Panel    Component Value Date/Time   CHOL 208 (H) 12/24/2010 0835   TRIG 122 12/24/2010 0835   HDL 48 12/24/2010 0835   CHOLHDL 4.3 12/24/2010 0835   VLDL 24 12/24/2010 0835   LDLCALC 136 (H) 12/24/2010 0835      Wt Readings from Last 3 Encounters:  11/23/15 150 lb (68 kg)  11/05/15 150 lb (68 kg)  09/05/15 150 lb (68 kg)    ASSESSMENT AND PLAN:  1. Hypertension: Blood pressure is well controlled currently. No changes in her regimen. I will order echocardiogram is requested by Dr. Harl Bowie on last office visit.  2. Chronic back pain from arthritis: I've advised her to use over-the-counter Thera-Care heating pad that she can wear under her close and continued to walk and move about without having to stay seated with a heating pad. She is working with the physical therapy and would like to become more mobile.   Current medicines are reviewed at length with the patient today.    Labs/ tests ordered today include:  Orders Placed This Encounter  Procedures  . EKG 12-Lead  . ECHOCARDIOGRAM COMPLETE     Disposition:   FU with Dr. Harl Bowie in 4 months    Signed, Jory Sims, NP  11/23/2015 3:42 PM    [facility]. [address], [place], [place] [address] Phone: [phone]; Fax: [phone]

## 2015-11-24 DIAGNOSIS — M6281 Muscle weakness (generalized): Secondary | ICD-10-CM | POA: Diagnosis not present

## 2015-11-24 DIAGNOSIS — M545 Low back pain: Secondary | ICD-10-CM | POA: Diagnosis not present

## 2015-11-24 DIAGNOSIS — R2681 Unsteadiness on feet: Secondary | ICD-10-CM | POA: Diagnosis not present

## 2015-11-24 DIAGNOSIS — R2689 Other abnormalities of gait and mobility: Secondary | ICD-10-CM | POA: Diagnosis not present

## 2015-11-27 ENCOUNTER — Encounter: Payer: Self-pay | Admitting: *Deleted

## 2015-11-27 DIAGNOSIS — M6281 Muscle weakness (generalized): Secondary | ICD-10-CM | POA: Diagnosis not present

## 2015-11-27 DIAGNOSIS — M545 Low back pain: Secondary | ICD-10-CM | POA: Diagnosis not present

## 2015-11-27 DIAGNOSIS — R2689 Other abnormalities of gait and mobility: Secondary | ICD-10-CM | POA: Diagnosis not present

## 2015-11-27 DIAGNOSIS — R2681 Unsteadiness on feet: Secondary | ICD-10-CM | POA: Diagnosis not present

## 2015-11-28 DIAGNOSIS — B351 Tinea unguium: Secondary | ICD-10-CM | POA: Diagnosis not present

## 2015-11-28 DIAGNOSIS — M79674 Pain in right toe(s): Secondary | ICD-10-CM | POA: Diagnosis not present

## 2015-11-29 DIAGNOSIS — M6281 Muscle weakness (generalized): Secondary | ICD-10-CM | POA: Diagnosis not present

## 2015-11-29 DIAGNOSIS — M545 Low back pain: Secondary | ICD-10-CM | POA: Diagnosis not present

## 2015-11-29 DIAGNOSIS — R2689 Other abnormalities of gait and mobility: Secondary | ICD-10-CM | POA: Diagnosis not present

## 2015-11-29 DIAGNOSIS — R2681 Unsteadiness on feet: Secondary | ICD-10-CM | POA: Diagnosis not present

## 2015-12-01 DIAGNOSIS — R2681 Unsteadiness on feet: Secondary | ICD-10-CM | POA: Diagnosis not present

## 2015-12-01 DIAGNOSIS — R2689 Other abnormalities of gait and mobility: Secondary | ICD-10-CM | POA: Diagnosis not present

## 2015-12-01 DIAGNOSIS — M545 Low back pain: Secondary | ICD-10-CM | POA: Diagnosis not present

## 2015-12-01 DIAGNOSIS — M6281 Muscle weakness (generalized): Secondary | ICD-10-CM | POA: Diagnosis not present

## 2015-12-03 DIAGNOSIS — M545 Low back pain: Secondary | ICD-10-CM | POA: Diagnosis not present

## 2015-12-03 DIAGNOSIS — R2689 Other abnormalities of gait and mobility: Secondary | ICD-10-CM | POA: Diagnosis not present

## 2015-12-03 DIAGNOSIS — R2681 Unsteadiness on feet: Secondary | ICD-10-CM | POA: Diagnosis not present

## 2015-12-03 DIAGNOSIS — M6281 Muscle weakness (generalized): Secondary | ICD-10-CM | POA: Diagnosis not present

## 2015-12-04 DIAGNOSIS — M6281 Muscle weakness (generalized): Secondary | ICD-10-CM | POA: Diagnosis not present

## 2015-12-04 DIAGNOSIS — M545 Low back pain: Secondary | ICD-10-CM | POA: Diagnosis not present

## 2015-12-04 DIAGNOSIS — R2689 Other abnormalities of gait and mobility: Secondary | ICD-10-CM | POA: Diagnosis not present

## 2015-12-04 DIAGNOSIS — R2681 Unsteadiness on feet: Secondary | ICD-10-CM | POA: Diagnosis not present

## 2015-12-05 ENCOUNTER — Ambulatory Visit (HOSPITAL_COMMUNITY): Payer: Medicare Other | Attending: Adult Health

## 2015-12-06 DIAGNOSIS — H5213 Myopia, bilateral: Secondary | ICD-10-CM | POA: Diagnosis not present

## 2015-12-06 DIAGNOSIS — H401132 Primary open-angle glaucoma, bilateral, moderate stage: Secondary | ICD-10-CM | POA: Diagnosis not present

## 2015-12-06 DIAGNOSIS — H52223 Regular astigmatism, bilateral: Secondary | ICD-10-CM | POA: Diagnosis not present

## 2015-12-06 DIAGNOSIS — H26491 Other secondary cataract, right eye: Secondary | ICD-10-CM | POA: Diagnosis not present

## 2015-12-06 DIAGNOSIS — H35372 Puckering of macula, left eye: Secondary | ICD-10-CM | POA: Diagnosis not present

## 2015-12-12 DIAGNOSIS — R2681 Unsteadiness on feet: Secondary | ICD-10-CM | POA: Diagnosis not present

## 2015-12-12 DIAGNOSIS — M6281 Muscle weakness (generalized): Secondary | ICD-10-CM | POA: Diagnosis not present

## 2015-12-12 DIAGNOSIS — R2689 Other abnormalities of gait and mobility: Secondary | ICD-10-CM | POA: Diagnosis not present

## 2015-12-12 DIAGNOSIS — M545 Low back pain: Secondary | ICD-10-CM | POA: Diagnosis not present

## 2015-12-15 DIAGNOSIS — M6281 Muscle weakness (generalized): Secondary | ICD-10-CM | POA: Diagnosis not present

## 2015-12-15 DIAGNOSIS — R2681 Unsteadiness on feet: Secondary | ICD-10-CM | POA: Diagnosis not present

## 2015-12-15 DIAGNOSIS — R2689 Other abnormalities of gait and mobility: Secondary | ICD-10-CM | POA: Diagnosis not present

## 2015-12-15 DIAGNOSIS — M545 Low back pain: Secondary | ICD-10-CM | POA: Diagnosis not present

## 2015-12-18 DIAGNOSIS — R2681 Unsteadiness on feet: Secondary | ICD-10-CM | POA: Diagnosis not present

## 2015-12-18 DIAGNOSIS — R2689 Other abnormalities of gait and mobility: Secondary | ICD-10-CM | POA: Diagnosis not present

## 2015-12-18 DIAGNOSIS — M545 Low back pain: Secondary | ICD-10-CM | POA: Diagnosis not present

## 2015-12-18 DIAGNOSIS — M6281 Muscle weakness (generalized): Secondary | ICD-10-CM | POA: Diagnosis not present

## 2015-12-20 DIAGNOSIS — M545 Low back pain: Secondary | ICD-10-CM | POA: Diagnosis not present

## 2015-12-20 DIAGNOSIS — R2689 Other abnormalities of gait and mobility: Secondary | ICD-10-CM | POA: Diagnosis not present

## 2015-12-20 DIAGNOSIS — R2681 Unsteadiness on feet: Secondary | ICD-10-CM | POA: Diagnosis not present

## 2015-12-20 DIAGNOSIS — M6281 Muscle weakness (generalized): Secondary | ICD-10-CM | POA: Diagnosis not present

## 2015-12-21 DIAGNOSIS — Z79899 Other long term (current) drug therapy: Secondary | ICD-10-CM | POA: Diagnosis not present

## 2015-12-21 DIAGNOSIS — E559 Vitamin D deficiency, unspecified: Secondary | ICD-10-CM | POA: Diagnosis not present

## 2015-12-21 DIAGNOSIS — R809 Proteinuria, unspecified: Secondary | ICD-10-CM | POA: Diagnosis not present

## 2015-12-21 DIAGNOSIS — I10 Essential (primary) hypertension: Secondary | ICD-10-CM | POA: Diagnosis not present

## 2015-12-21 DIAGNOSIS — D509 Iron deficiency anemia, unspecified: Secondary | ICD-10-CM | POA: Diagnosis not present

## 2015-12-21 DIAGNOSIS — N183 Chronic kidney disease, stage 3 (moderate): Secondary | ICD-10-CM | POA: Diagnosis not present

## 2015-12-27 DIAGNOSIS — N183 Chronic kidney disease, stage 3 (moderate): Secondary | ICD-10-CM | POA: Diagnosis not present

## 2015-12-27 DIAGNOSIS — R809 Proteinuria, unspecified: Secondary | ICD-10-CM | POA: Diagnosis not present

## 2015-12-27 DIAGNOSIS — D649 Anemia, unspecified: Secondary | ICD-10-CM | POA: Diagnosis not present

## 2015-12-27 DIAGNOSIS — I10 Essential (primary) hypertension: Secondary | ICD-10-CM | POA: Diagnosis not present

## 2015-12-28 DIAGNOSIS — D509 Iron deficiency anemia, unspecified: Secondary | ICD-10-CM | POA: Diagnosis not present

## 2015-12-28 DIAGNOSIS — E782 Mixed hyperlipidemia: Secondary | ICD-10-CM | POA: Diagnosis not present

## 2016-01-02 DIAGNOSIS — Z0000 Encounter for general adult medical examination without abnormal findings: Secondary | ICD-10-CM | POA: Diagnosis not present

## 2016-01-02 DIAGNOSIS — M129 Arthropathy, unspecified: Secondary | ICD-10-CM | POA: Diagnosis not present

## 2016-01-02 DIAGNOSIS — N183 Chronic kidney disease, stage 3 (moderate): Secondary | ICD-10-CM | POA: Diagnosis not present

## 2016-01-02 DIAGNOSIS — I10 Essential (primary) hypertension: Secondary | ICD-10-CM | POA: Diagnosis not present

## 2016-02-06 ENCOUNTER — Telehealth: Payer: Self-pay | Admitting: *Deleted

## 2016-02-06 DIAGNOSIS — B351 Tinea unguium: Secondary | ICD-10-CM | POA: Diagnosis not present

## 2016-02-06 DIAGNOSIS — M79674 Pain in right toe(s): Secondary | ICD-10-CM | POA: Diagnosis not present

## 2016-02-06 NOTE — Telephone Encounter (Signed)
Pt came in to office. Informed patient that at her last visit with Dr Glo Herring, his note stated he was hesitant to order any anti-incontinence medications on her due to side effects. He also stated that needed to be performed by her primary medical provider Dr Nevada Crane. I advised patient to see Dr Nevada Crane and to contact us if he had any questions or concerns. Pt verbalized understanding.

## 2016-02-08 ENCOUNTER — Other Ambulatory Visit: Payer: Self-pay | Admitting: *Deleted

## 2016-02-08 MED ORDER — CARVEDILOL 12.5 MG PO TABS: 12.5000 mg | ORAL_TABLET | Freq: Two times a day (BID) | ORAL | 3 refills | Status: DC

## 2016-02-15 DIAGNOSIS — I10 Essential (primary) hypertension: Secondary | ICD-10-CM | POA: Diagnosis not present

## 2016-02-15 DIAGNOSIS — R3981 Functional urinary incontinence: Secondary | ICD-10-CM | POA: Diagnosis not present

## 2016-02-15 DIAGNOSIS — L22 Diaper dermatitis: Secondary | ICD-10-CM | POA: Diagnosis not present

## 2016-02-19 ENCOUNTER — Telehealth: Payer: Self-pay

## 2016-02-19 NOTE — Telephone Encounter (Signed)
Pt's niece, Tyechia Delcastillo, called and said that pt would like to discuss something personal with Dr. Oneida Alar. She didn't want to make an appt because it is ONLY PERSONAL.  Said if she could talk to Dr. Oneida Alar about 15 min. She wanted to know how to go about this and if she needs to make an appt. I told Izora Gala that I will let Dr. Oneida Alar know ( she is off today) and we will let her know how Dr. Oneida Alar will handle it. Pt can be reached at [phone].

## 2016-02-22 ENCOUNTER — Encounter (HOSPITAL_COMMUNITY): Payer: Self-pay | Admitting: *Deleted

## 2016-02-22 ENCOUNTER — Emergency Department (HOSPITAL_COMMUNITY)
Admission: EM | Admit: 2016-02-22 | Discharge: 2016-02-22 | Disposition: A | Discharge status: Home / Self Care | Payer: Medicare Other | Attending: Emergency Medicine | Admitting: Emergency Medicine

## 2016-02-22 ENCOUNTER — Telehealth: Payer: Self-pay | Admitting: Gastroenterology

## 2016-02-22 DIAGNOSIS — I10 Essential (primary) hypertension: Secondary | ICD-10-CM | POA: Diagnosis not present

## 2016-02-22 DIAGNOSIS — R21 Rash and other nonspecific skin eruption: Secondary | ICD-10-CM | POA: Diagnosis not present

## 2016-02-22 DIAGNOSIS — L989 Disorder of the skin and subcutaneous tissue, unspecified: Secondary | ICD-10-CM | POA: Insufficient documentation

## 2016-02-22 DIAGNOSIS — Z79899 Other long term (current) drug therapy: Secondary | ICD-10-CM | POA: Diagnosis not present

## 2016-02-22 DIAGNOSIS — R238 Other skin changes: Secondary | ICD-10-CM

## 2016-02-22 NOTE — ED Provider Notes (Signed)
[facility] DEPT Provider Note   CSN: [id number] Arrival date & time: 02/22/16  [id number]  By signing my name below, I, Deborah Farrell, attest that this documentation has been prepared under the direction and in the presence of Deborah Belling, MD. Electronically Signed: Collene Farrell, Scribe. 02/22/16. 10:24 AM.  History   Chief Complaint Chief Complaint  Patient presents with  . Rash    HPI Comments: Deborah Farrell is a 83 y.o. female with a hx of overactive bladder, who presents to the Emergency Department complaining of a sudden-onset rash that appeared 3 weeks ago. This is a new problem. Patient reports a rash between the legs due to urine leakage around her diaper. Patient has associated irritation. Patient reports being seen by her primary care physician one and a half weeks ago, she was prescribed myrbetiq due to urinary frequency. Patient reports taking an over the counter rash cream from [employer] (moisture barrier cream) with no improvement. Patient denies any urinary frequency, fever, cold symptoms, weight gain, or dysuria.   The history is provided by the patient. No language interpreter was used.    Past Medical History:  Diagnosis Date  . Allergy history, radiographic dye   . Arthritis   . Dysuria   . Glaucoma   . HTN (hypertension)   . IBS (irritable bowel syndrome)    Non-ulcer dyspepsia; hemorrhoids  . Overactive bladder   . Tubulovillous adenoma 2007   with multifocal high grade dysplasia-WFBH 2007;TV ADENOMA w/o dysplasia 2008;  SIMPLE ADENOMA 2009; 60mm SIMPLE ADENOMA JAN 2011  . Urinary frequency   . Urinary tract infection     Patient Active Problem List   Diagnosis Date Noted  . Nocturia 07/27/2014  . Recurrent cystitis 07/08/2013  . Atrophic vaginitis 07/08/2013  . Internal hemorrhoids with complication [id number]  . Stiffness of joints, not elsewhere classified, multiple sites 11/19/2012  . Poor balance 11/19/2012  . Hip pain, right 11/20/2011  .  Back pain, lumbosacral 11/20/2011  . Dyspepsia 07/19/2011  . Tubulovillous adenoma   . HYPERLIPIDEMIA 08/20/2007  . GLAUCOMA 08/20/2007  . HYPERTENSION 08/20/2007  . Gastroesophageal reflux disease 08/20/2007  . IRRITABLE BOWEL SYNDROME 08/20/2007    Past Surgical History:  Procedure Laterality Date  . APPENDECTOMY    . CHOLECYSTECTOMY    . COLONOSCOPY  2008, 2011   diverticulitis diagnosed, 30mm sessile polyp removed inascending colon. area tattood for future observation and reccomended f/u colonoscopy in one year  . COLONOSCOPY N/A 04/24/2012   SLF:The colon IS redundant/ Single RECTAL polyp/Moderate diverticulosis/Small internal hemorrhoids  . OVARIAN CYST REMOVAL    . VAGINAL HYSTERECTOMY      OB History    Gravida Para Term Preterm AB Living   0 0 0 0 0 0   SAB TAB Ectopic Multiple Live Births   0 0 0 0 0       Home Medications    Prior to Admission medications   Medication Sig Start Date End Date Taking? Authorizing Provider  acetaminophen (TYLENOL) 500 MG tablet Take 650 mg by mouth as needed.     Historical Provider, MD  amLODipine-valsartan (EXFORGE) 5-320 MG per tablet Take 0.5 tablets by mouth daily.     Historical Provider, MD  carvedilol (COREG) 12.5 MG tablet Take 1 tablet (12.5 mg total) by mouth 2 (two) times daily with a meal. 02/08/16 07/28/17  Lendon Colonel, NP  chlorthalidone (HYGROTON) 25 MG tablet Take 12.5 mg by mouth daily. Take 1/2 tab    Historical Provider,  MD  estradiol (ESTRACE VAGINAL) 0.1 MG/GM vaginal cream Place 1 Applicatorful vaginally daily. Patient taking differently: Place 1 Applicatorful vaginally as needed.  02/23/15   Jonnie Kind, MD  latanoprost (XALATAN) 0.005 % ophthalmic solution Place 1 drop into both eyes at bedtime.      Historical Provider, MD  Multiple Vitamins-Minerals (MULTI COMPLETE PO) Take 1 tablet by mouth daily.     Historical Provider, MD  pantoprazole (PROTONIX) 40 MG tablet TAKE 1 BY MOUTH daily 08/11/15   Annitta Needs, NP  Probiotic Product (ALIGN PO) Take 1 tablet by mouth daily.     Historical Provider, MD  prochlorperazine (COMPAZINE) 5 MG tablet TAKE 1 TO 2 TABLETS BY MOUTH DAILY TO PREVENT NAUSEA 10/18/15   Mahala Menghini, PA-C    Family History Family History  Problem Relation Age of Onset  . Stroke Mother   . Heart attack Sister   . Heart disease Sister   . Cancer Maternal Grandfather     Social History Social History  Substance Use Topics  . Smoking status: Never Smoker  . Smokeless tobacco: Never Used  . Alcohol use No     Allergies   Aciphex [rabeprazole]; Colchicine; Dicyclomine hcl; Hyoscyamine sulfate; Iodinated diagnostic agents; Nexium [esomeprazole magnesium]; Omeprazole; and Sulfonamide derivatives   Review of Systems Review of Systems  Genitourinary: Negative for decreased urine volume and frequency.  Skin: Positive for rash.     Physical Exam Updated Vital Signs BP 119/90 (BP Location: Right Arm)   Pulse 61   Temp 97.3 F (36.3 C) (Oral)   Resp 18   Ht 5\' 4"  (1.626 m)   Wt 152 lb (68.9 kg)   SpO2 100%   BMI 26.09 kg/m   Physical Exam  Constitutional: She is oriented to person, place, and time. She appears well-developed.  HENT:  Head: Normocephalic and atraumatic.  Mouth/Throat: Oropharynx is clear and moist.  Cardiovascular: Normal rate.   Pulmonary/Chest: Effort normal.  Musculoskeletal: Normal range of motion.  Neurological: She is alert and oriented to person, place, and time.  Skin: Skin is warm and dry. Rash noted.  Patient with at most slight erythema and perineal area. There is some barrier cream. No labial irritation. No fluctuance.  Psychiatric: She has a normal mood and affect.     ED Treatments / Results  DIAGNOSTIC STUDIES: Oxygen Saturation is 100% on RA, normal by my interpretation.    COORDINATION OF CARE: 10:22 AM Discussed treatment plan with pt at bedside and pt agreed to plan.  Labs (all labs ordered are listed, but  only abnormal results are displayed) Labs Reviewed - No data to display  EKG  EKG Interpretation None       Radiology No results found.  Procedures Procedures (including critical care time)  Medications Ordered in ED Medications - No data to display   Initial Impression / Assessment and Plan / ED Course  I have reviewed the triage vital signs and the nursing notes.  Pertinent labs & imaging results that were available during my care of the patient were reviewed by me and considered in my medical decision making (see chart for details).   patient with irritation from her diaper. She is concerned about the leaking of her urine but the skin overall looks good. Does not appear to be irritated from the moistness. Does have a barrier cream in place. Blood tried different types of diapers and may adjust her cream. Discharge home. Fina l Clinical Impressions(s) /  ED Diagnoses   Final diagnoses:  Skin irritation    New Prescriptions Discharge Medication List as of 02/22/2016 10:27 AM     I personally performed the services described in this documentation, which was scribed in my presence. The recorded information has been reviewed and is accurate.       Deborah Belling, MD 02/22/16 [phone]

## 2016-02-22 NOTE — Telephone Encounter (Signed)
REVIEWED. CONTACTED PATIENT.

## 2016-02-22 NOTE — Discharge Instructions (Signed)
Try wearing a nighttime diaper. Try different types the daytime diaper. You may try without the barrier cream for a few days and see if that helps.

## 2016-02-22 NOTE — Telephone Encounter (Signed)
Inesha Pinheiro was calling to speak with [facility] about patient. Please call her at [phone]

## 2016-02-22 NOTE — Telephone Encounter (Signed)
Deborah Farrell said she has spoken to Dr. Oneida Alar.

## 2016-02-22 NOTE — ED Triage Notes (Addendum)
Pt c/o rash between legs from urine leakage around her diaper x 3 weeks. Pt denies unusual urinary frequency. Hx of overactive bladder.

## 2016-02-26 ENCOUNTER — Telehealth: Payer: Self-pay | Admitting: Gastroenterology

## 2016-02-26 MED ORDER — PROCHLORPERAZINE MALEATE 5 MG PO TABS: 5.0000 mg | ORAL_TABLET | Freq: Four times a day (QID) | ORAL | 3 refills | Status: DC | PRN

## 2016-02-26 NOTE — Telephone Encounter (Signed)
Pt is aware.  

## 2016-02-26 NOTE — Addendum Note (Signed)
Addended by: Danie Binder on: 02/26/2016 04:11 PM   Modules accepted: Orders

## 2016-02-26 NOTE — Telephone Encounter (Signed)
Pt said that [facility] had put her on compazine and the prescription has expired. She wants to know if [facility] still wants her to take this medication. Please advise and call [phone]

## 2016-02-26 NOTE — Telephone Encounter (Signed)
Pt said she really does still need the compazine for nausea if Dr. Oneida Alar would like to keep her on it. Forwarding to Dr. Oneida Alar.

## 2016-02-26 NOTE — Telephone Encounter (Signed)
PLEASE CALL PT. RX SENT FOR COMPAZINE 3 MO SUPPLY.

## 2016-02-27 DIAGNOSIS — R21 Rash and other nonspecific skin eruption: Secondary | ICD-10-CM | POA: Diagnosis not present

## 2016-02-27 DIAGNOSIS — Z6824 Body mass index (BMI) 24.0-24.9, adult: Secondary | ICD-10-CM | POA: Diagnosis not present

## 2016-03-06 NOTE — Telephone Encounter (Signed)
REVIEWED-NO ADDITIONAL RECOMMENDATIONS. 

## 2016-03-12 DIAGNOSIS — F5101 Primary insomnia: Secondary | ICD-10-CM | POA: Diagnosis not present

## 2016-03-12 DIAGNOSIS — B373 Candidiasis of vulva and vagina: Secondary | ICD-10-CM | POA: Diagnosis not present

## 2016-03-20 DIAGNOSIS — B3789 Other sites of candidiasis: Secondary | ICD-10-CM | POA: Diagnosis not present

## 2016-03-26 ENCOUNTER — Ambulatory Visit (INDEPENDENT_AMBULATORY_CARE_PROVIDER_SITE_OTHER): Payer: Medicare Other | Admitting: Cardiology

## 2016-03-26 ENCOUNTER — Encounter: Payer: Self-pay | Admitting: Cardiology

## 2016-03-26 VITALS — BP 108/66 | HR 68 | Ht 64.0 in | Wt 145.0 lb

## 2016-03-26 DIAGNOSIS — R011 Cardiac murmur, unspecified: Secondary | ICD-10-CM | POA: Diagnosis not present

## 2016-03-26 DIAGNOSIS — E782 Mixed hyperlipidemia: Secondary | ICD-10-CM | POA: Diagnosis not present

## 2016-03-26 DIAGNOSIS — I10 Essential (primary) hypertension: Secondary | ICD-10-CM | POA: Diagnosis not present

## 2016-03-26 DIAGNOSIS — R002 Palpitations: Secondary | ICD-10-CM

## 2016-03-26 MED ORDER — CARVEDILOL 12.5 MG PO TABS: 12.5000 mg | ORAL_TABLET | Freq: Two times a day (BID) | ORAL | 3 refills | Status: DC

## 2016-03-26 NOTE — Progress Notes (Signed)
Clinical Summary Ms. Schurman is a 83 y.o.female seen today for follow up of the following medical problems.   1. HTN - she remains compliant with meds - does not check bp at home regularly.   2. Hyperlipidemia - followed by pcp, has not been on statin   3. Heart murmur - noted at prior visits. Echo was ordered however patient did not schedule to have done.  - denies any significant SOB/DOE, no LE edema  4. Palpitations - occasional palpitations in the morning. She associates it with taking her mybetriq at night time. Does not have symptoms at any other time of day.    Past Medical History:  Diagnosis Date  . Allergy history, radiographic dye   . Arthritis   . Dysuria   . Glaucoma   . HTN (hypertension)   . IBS (irritable bowel syndrome)    Non-ulcer dyspepsia; hemorrhoids  . Overactive bladder   . 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 Urinary frequency   . Urinary tract infection      Allergies  Allergen Reactions  . Aciphex [Rabeprazole] Other (See Comments)    intolerance  . Colchicine   . Dicyclomine Hcl Other (See Comments)    Unknown  . Hyoscyamine Sulfate Other (See Comments)    Unknown  . Iodinated Diagnostic Agents   . Nexium [Esomeprazole Magnesium] Other (See Comments)    G.I. Upset  . Omeprazole Other (See Comments)    GI upset  . Sulfonamide Derivatives Other (See Comments)    Unknown     Current Outpatient Prescriptions  Medication Sig Dispense Refill  . acetaminophen (TYLENOL) 500 MG tablet Take 650 mg by mouth as needed.     Marland Kitchen amLODipine-valsartan (EXFORGE) 5-320 MG per tablet Take 0.5 tablets by mouth daily.     . carvedilol (COREG) 12.5 MG tablet Take 1 tablet (12.5 mg total) by mouth 2 (two) times daily with a meal. 180 tablet 3  . chlorthalidone (HYGROTON) 25 MG tablet Take 12.5 mg by mouth daily. Take 1/2 tab     . estradiol (ESTRACE VAGINAL) 0.1 MG/GM vaginal cream Place 1 Applicatorful vaginally daily. (Patient taking differently: Place 1 Applicatorful vaginally as needed. ) 4 g 0  . latanoprost (XALATAN) 0.005 % ophthalmic solution Place 1 drop into both eyes at bedtime.      . Multiple Vitamins-Minerals (MULTI COMPLETE PO) Take 1 tablet by mouth daily.     . pantoprazole (PROTONIX) 40 MG tablet TAKE 1 BY MOUTH daily 90 tablet 3  . Probiotic Product (ALIGN PO) Take 1 tablet by mouth daily.     . prochlorperazine (COMPAZINE) 5 MG tablet Take 1 tablet (5 mg total) by mouth every 6 (six) hours as needed for nausea or vomiting. 180 tablet 3   No current facility-administered medications for this visit.      Past Surgical History:  Procedure Laterality Date  . APPENDECTOMY    . CHOLECYSTECTOMY    . COLONOSCOPY  2008, 2011   diverticulitis diagnosed, 23mm sessile polyp removed inascending colon. area tattood for future observation and reccomended f/u colonoscopy in one year  . COLONOSCOPY N/A 04/24/2012   SLF:The colon IS redundant/ Single RECTAL polyp/Moderate diverticulosis/Small internal hemorrhoids  . OVARIAN CYST REMOVAL    . VAGINAL HYSTERECTOMY       Allergies  Allergen Reactions  . Aciphex [Rabeprazole] Other (See Comments)  intolerance  . Colchicine   . Dicyclomine Hcl Other (See Comments)    Unknown  . Hyoscyamine Sulfate Other (See Comments)    Unknown  . Iodinated Diagnostic Agents   . Nexium [Esomeprazole Magnesium] Other (See Comments)    G.I. Upset  . Omeprazole Other (See Comments)    GI upset  . Sulfonamide Derivatives Other (See Comments)    Unknown      Family History  Problem Relation Age of Onset  . Stroke Mother   . Heart attack Sister   . Heart disease Sister   . Cancer Maternal Grandfather      Social History Ms. Korenek reports that she has never smoked. She has never used smokeless tobacco. Ms. Temkin reports that she does not drink  alcohol.   Review of Systems CONSTITUTIONAL: No weight loss, fever, chills, weakness or fatigue.  HEENT: Eyes: No visual loss, blurred vision, double vision or yellow sclerae.No hearing loss, sneezing, congestion, runny nose or sore throat.  SKIN: No rash or itching.  CARDIOVASCULAR: per HPI RESPIRATORY: No shortness of breath, cough or sputum.  GASTROINTESTINAL: No anorexia, nausea, vomiting or diarrhea. No abdominal pain or blood.  GENITOURINARY: No burning on urination, no polyuria NEUROLOGICAL: No headache, dizziness, syncope, paralysis, ataxia, numbness or tingling in the extremities. No change in bowel or bladder control.  MUSCULOSKELETAL: No muscle, back pain, joint pain or stiffness.  LYMPHATICS: No enlarged nodes. No history of splenectomy.  PSYCHIATRIC: No history of depression or anxiety.  ENDOCRINOLOGIC: No reports of sweating, cold or heat intolerance. No polyuria or polydipsia.  Marland Kitchen   Physical Examination Vitals:   03/26/16 1444  BP: 108/66  Pulse: 68   Vitals:   03/26/16 1444  Weight: 145 lb (65.8 kg)  Height: 5\' 4"  (1.626 m)    Gen: resting comfortably, no acute distress HEENT: no scleral icterus, pupils equal round and reactive, no palptable cervical adenopathy,  CV: RRR, no m/r/g, no jvd Resp: Clear to auscultation bilaterally GI: abdomen is soft, non-tender, non-distended, normal bowel sounds, no hepatosplenomegaly MSK: extremities are warm, no edema.  Skin: warm, no rash Neuro:  no focal deficits Psych: appropriate affect      Assessment and Plan  1. HTN - bp is at goal - due to palpitations in the early AM we will try increasing her PM coreg. To allow room with her bp we will lower her amlodopine/valsartan combination.  - she will submit bp log in 1 week.   2. HL -request labs from pcp  3. Heart murmur - no further workup at this time. Given her advanced age and lack of symptoms, echo would not change management  4. Palpitations - only  occur in early AM, possibly due to mybetriq at night. Try increasing evening dose of coreg.         Arnoldo Lenis, M.D.

## 2016-03-26 NOTE — Patient Instructions (Addendum)
Medication Instructions:  COREG- TAKE 6.25 MG IN THE MORNING ( 1/2 TABLET) TAKE 12.5 MG IN THE EVENING  ( 1 TABLET)  Take EXFORGE 2.5-15 MG DAILY ( 1/2 TABLET )  Labwork: I WILL REQUEST A COPY OF LABS FROM PCP  Testing/Procedures: NONE  Follow-Up: Your physician wants you to follow-up in: 1 YEAR .  You will receive a reminder letter in the mail two months in advance. If you don't receive a letter, please call our office to schedule the follow-up appointment.  Any Other Special Instructions Will Be Listed Below (If Applicable).  KEEP A BLOOD PRESSURE LOG FOR 1 WEEK AND EITHER CALL WITH THE NUMBERS OR DROP IT OFF BY THE OFFICE FOR DR. BRANCH TO REVIEW.    If you need a refill on your cardiac medications before your next appointment, please call your pharmacy.

## 2016-04-10 ENCOUNTER — Telehealth: Payer: Self-pay | Admitting: *Deleted

## 2016-04-10 NOTE — Telephone Encounter (Signed)
Patient notified and voiced understanding.

## 2016-04-10 NOTE — Telephone Encounter (Signed)
-----   Message from Arnoldo Lenis, MD sent at 04/10/2016  1:42 PM EDT ----- Home Bp's look good, no changes  Zandra Abts MD

## 2016-04-16 DIAGNOSIS — B351 Tinea unguium: Secondary | ICD-10-CM | POA: Diagnosis not present

## 2016-04-16 DIAGNOSIS — M79674 Pain in right toe(s): Secondary | ICD-10-CM | POA: Diagnosis not present

## 2016-05-02 DIAGNOSIS — B372 Candidiasis of skin and nail: Secondary | ICD-10-CM | POA: Diagnosis not present

## 2016-05-02 DIAGNOSIS — N3281 Overactive bladder: Secondary | ICD-10-CM | POA: Diagnosis not present

## 2016-05-03 DIAGNOSIS — N183 Chronic kidney disease, stage 3 (moderate): Secondary | ICD-10-CM | POA: Diagnosis not present

## 2016-05-03 DIAGNOSIS — Z79899 Other long term (current) drug therapy: Secondary | ICD-10-CM | POA: Diagnosis not present

## 2016-05-03 DIAGNOSIS — D509 Iron deficiency anemia, unspecified: Secondary | ICD-10-CM | POA: Diagnosis not present

## 2016-05-03 DIAGNOSIS — I10 Essential (primary) hypertension: Secondary | ICD-10-CM | POA: Diagnosis not present

## 2016-05-08 DIAGNOSIS — D638 Anemia in other chronic diseases classified elsewhere: Secondary | ICD-10-CM | POA: Diagnosis not present

## 2016-05-08 DIAGNOSIS — N183 Chronic kidney disease, stage 3 (moderate): Secondary | ICD-10-CM | POA: Diagnosis not present

## 2016-05-08 DIAGNOSIS — R809 Proteinuria, unspecified: Secondary | ICD-10-CM | POA: Diagnosis not present

## 2016-05-17 DIAGNOSIS — L299 Pruritus, unspecified: Secondary | ICD-10-CM | POA: Diagnosis not present

## 2016-05-17 DIAGNOSIS — N3281 Overactive bladder: Secondary | ICD-10-CM | POA: Diagnosis not present

## 2016-05-17 DIAGNOSIS — N760 Acute vaginitis: Secondary | ICD-10-CM | POA: Diagnosis not present

## 2016-05-17 DIAGNOSIS — Z6821 Body mass index (BMI) 21.0-21.9, adult: Secondary | ICD-10-CM | POA: Diagnosis not present

## 2016-05-28 ENCOUNTER — Encounter: Payer: Self-pay | Admitting: Obstetrics and Gynecology

## 2016-05-28 ENCOUNTER — Ambulatory Visit (INDEPENDENT_AMBULATORY_CARE_PROVIDER_SITE_OTHER): Payer: Medicare Other | Admitting: Obstetrics and Gynecology

## 2016-05-28 VITALS — BP 110/60 | HR 78 | Wt 147.3 lb

## 2016-05-28 DIAGNOSIS — L259 Unspecified contact dermatitis, unspecified cause: Secondary | ICD-10-CM

## 2016-05-29 ENCOUNTER — Telehealth: Payer: Self-pay | Admitting: Obstetrics and Gynecology

## 2016-05-29 ENCOUNTER — Telehealth: Payer: Self-pay | Admitting: *Deleted

## 2016-05-29 NOTE — Telephone Encounter (Signed)
Verbal ordered called to [facility] for Hydrocortizone cream 1% to be applied to affected area daily with PRN refills.

## 2016-05-29 NOTE — Telephone Encounter (Signed)
Called patient stated she wanted it sent to [facility] in [place]. Will call in there.

## 2016-06-02 NOTE — Progress Notes (Signed)
[facility] Visit  @DATE @            Patient name: Deborah Farrell MRN [id number]  Date of birth: [birth year]  CC & HPI:  JAKARI JACOT is a 83 y.o. female presenting today for Vulvar irritation on the inner thighs bilaterally.Marland Kitchen She was unclear whether was gentle or skin related until the exam  ROS:  ROS   Pertinent History Reviewed:   Reviewed: Significant for  Medical         Past Medical History:  Diagnosis Date  . Allergy history, radiographic dye   . Arthritis   . Dysuria   . Glaucoma   . HTN (hypertension)   . IBS (irritable bowel syndrome)    Non-ulcer dyspepsia; hemorrhoids  . Overactive bladder   . 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 Urinary frequency   . Urinary tract infection                               Surgical Hx:    Past Surgical History:  Procedure Laterality Date  . APPENDECTOMY    . CHOLECYSTECTOMY    . COLONOSCOPY  2008, 2011   diverticulitis diagnosed, 62mm sessile polyp removed inascending colon. area tattood for future observation and reccomended f/u colonoscopy in one year  . COLONOSCOPY N/A 04/24/2012   SLF:The colon IS redundant/ Single RECTAL polyp/Moderate diverticulosis/Small internal hemorrhoids  . OVARIAN CYST REMOVAL    . VAGINAL HYSTERECTOMY     Medications: Reviewed & Updated - see associated section                       Current Outpatient Prescriptions:  .  acetaminophen (TYLENOL) 500 MG tablet, Take 650 mg by mouth as needed. , Disp: , Rfl:  .  amLODipine-valsartan (EXFORGE) 5-320 MG per tablet, Take 0.5 tablets by mouth daily. , Disp: , Rfl:  .  carvedilol (COREG) 12.5 MG tablet, Take 1 tablet (12.5 mg total) by mouth 2 (two) times daily with a meal. TAKE 6.25 IN THE MORNING(1/2 TABLET) TAKE 12.5 IN THE EVENING.(1 TABLET), Disp: 180 tablet, Rfl: 3 .  chlorthalidone (HYGROTON) 25 MG tablet, Take  12.5 mg by mouth daily. Take 1/2 tab, Disp: , Rfl:  .  latanoprost (XALATAN) 0.005 % ophthalmic solution, Place 1 drop into both eyes at bedtime.  , Disp: , Rfl:  .  Multiple Vitamins-Minerals (MULTI COMPLETE PO), Take 1 tablet by mouth daily. , Disp: , Rfl:  .  MYRBETRIQ 25 MG TB24 tablet, TK 1 T PO D, Disp: , Rfl: 6 .  pantoprazole (PROTONIX) 40 MG tablet, TAKE 1 BY MOUTH daily, Disp: 90 tablet, Rfl: 3 .  Probiotic Product (ALIGN PO), Take 1 tablet by mouth daily. , Disp: , Rfl:  .  prochlorperazine (COMPAZINE) 5 MG tablet, Take 1 tablet (5 mg total) by mouth every 6 (six) hours as needed for nausea or vomiting., Disp: 180 tablet, Rfl: 3 .  estradiol (ESTRACE VAGINAL) 0.1 MG/GM vaginal cream, Place 1 Applicatorful vaginally daily. (Patient not taking: Reported on 05/28/2016), Disp: 4 g, Rfl: 0   Social History: Reviewed -  reports that she has never smoked. She has never used smokeless tobacco.  Objective Findings:  Vitals: Blood pressure 110/60, pulse 78, weight 147 lb 4.8 oz (66.8 kg).  Physical  Examination: General appearance - alert, well appearing, and in no distress, oriented to person, place, and time and normal appearing weight Mental status - alert, oriented to person, place, and time, normal mood, behavior, speech, dress, motor activity, and thought processes Pelvic - VULVA: normal appearing vulva with no masses, tenderness or lesions, VAGINA: normal appearing vagina with normal color and discharge, no lesions Extremities - peripheral pulses normal, no pedal edema, no clubbing or cyanosis, patient complains of irritation on the inner thigh and in the skin creases. I think she has a contact dermatitis related to her panty type. Is encouraged to wear loose clothing that does not have adhesive bands in the groin creases   Assessment & Plan:   A:  1. Contact dermatitis inner thighs bilaterally  P:  1. One percent hydrocortisone, loose clothing, follow-up. As needed

## 2016-06-06 DIAGNOSIS — H401132 Primary open-angle glaucoma, bilateral, moderate stage: Secondary | ICD-10-CM | POA: Diagnosis not present

## 2016-06-06 DIAGNOSIS — H5213 Myopia, bilateral: Secondary | ICD-10-CM | POA: Diagnosis not present

## 2016-06-06 DIAGNOSIS — H26491 Other secondary cataract, right eye: Secondary | ICD-10-CM | POA: Diagnosis not present

## 2016-06-06 DIAGNOSIS — H35372 Puckering of macula, left eye: Secondary | ICD-10-CM | POA: Diagnosis not present

## 2016-06-19 DIAGNOSIS — H9201 Otalgia, right ear: Secondary | ICD-10-CM | POA: Diagnosis not present

## 2016-06-19 DIAGNOSIS — Z6823 Body mass index (BMI) 23.0-23.9, adult: Secondary | ICD-10-CM | POA: Diagnosis not present

## 2016-06-26 ENCOUNTER — Ambulatory Visit (INDEPENDENT_AMBULATORY_CARE_PROVIDER_SITE_OTHER): Payer: Medicare Other | Admitting: Obstetrics and Gynecology

## 2016-06-26 ENCOUNTER — Encounter: Payer: Self-pay | Admitting: Obstetrics and Gynecology

## 2016-06-26 VITALS — BP 118/64 | HR 60 | Wt 150.0 lb

## 2016-06-26 DIAGNOSIS — R238 Other skin changes: Secondary | ICD-10-CM

## 2016-06-26 NOTE — Progress Notes (Signed)
Patient ID: Deborah Farrell, female   DOB: 1917/11/04, 83 y.o.   MRN: [id number]    [facility] Visit  @DATE @            Patient name: Deborah Farrell MRN [id number]  Date of birth: 10/27/1917  CC & HPI:   Deborah Farrell is a 83 y.o. female presenting today for tx of contact dermatitis to the inner thighs with 1% hydrocortisone 05/28/16. Pt states she believes the area is not healing well and continues to have sensitivity to the buttock. Patient has extensive questions, and is slow to communicate , though intellect remains apparently functional.  ROS:  ROS +rash  +skin sensitivity over ischial tuberosities.  Pertinent History Reviewed:   Reviewed: Significant for vaginal hysterectomy, ovarian cyst removal  Medical         Past Medical History:  Diagnosis Date  . Allergy history, radiographic dye   . Arthritis   . Dysuria   . Glaucoma   . HTN (hypertension)   . IBS (irritable bowel syndrome)    Non-ulcer dyspepsia; hemorrhoids  . Overactive bladder   . 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 Urinary frequency   . Urinary tract infection                               Surgical Hx:    Past Surgical History:  Procedure Laterality Date  . APPENDECTOMY    . CHOLECYSTECTOMY    . COLONOSCOPY  2008, 2011   diverticulitis diagnosed, 58mm sessile polyp removed inascending colon. area tattood for future observation and reccomended f/u colonoscopy in one year  . COLONOSCOPY N/A 04/24/2012   SLF:The colon IS redundant/ Single RECTAL polyp/Moderate diverticulosis/Small internal hemorrhoids  . OVARIAN CYST REMOVAL    . VAGINAL HYSTERECTOMY     Medications: Reviewed & Updated - see associated section                       Current Outpatient Prescriptions:  .  acetaminophen (TYLENOL) 500 MG tablet, Take 650 mg by mouth as needed. , Disp: , Rfl:  .   amLODipine-valsartan (EXFORGE) 5-320 MG per tablet, Take 0.5 tablets by mouth daily. , Disp: , Rfl:  .  carvedilol (COREG) 12.5 MG tablet, Take 1 tablet (12.5 mg total) by mouth 2 (two) times daily with a meal. TAKE 6.25 IN THE MORNING(1/2 TABLET) TAKE 12.5 IN THE EVENING.(1 TABLET), Disp: 180 tablet, Rfl: 3 .  chlorthalidone (HYGROTON) 25 MG tablet, Take 12.5 mg by mouth daily. Take 1/2 tab, Disp: , Rfl:  .  latanoprost (XALATAN) 0.005 % ophthalmic solution, Place 1 drop into both eyes at bedtime.  , Disp: , Rfl:  .  Multiple Vitamins-Minerals (MULTI COMPLETE PO), Take 1 tablet by mouth daily. , Disp: , Rfl:  .  MYRBETRIQ 25 MG TB24 tablet, TK 1 T PO D, Disp: , Rfl: 6 .  pantoprazole (PROTONIX) 40 MG tablet, TAKE 1 BY MOUTH daily, Disp: 90 tablet, Rfl: 3 .  Probiotic Product (ALIGN PO), Take 1 tablet by mouth daily. , Disp: , Rfl:  .  prochlorperazine (COMPAZINE) 5 MG tablet, Take 1 tablet (5 mg total) by mouth every 6 (six) hours as needed for nausea or vomiting., Disp: 180 tablet, Rfl: 3 .  estradiol (ESTRACE VAGINAL) 0.1 MG/GM  vaginal cream, Place 1 Applicatorful vaginally daily. (Patient not taking: Reported on 05/28/2016), Disp: 4 g, Rfl: 0   Social History: Reviewed -  reports that she has never smoked. She has never used smokeless tobacco.  Objective Findings:  Vitals: Blood pressure 118/64, pulse 60, weight 150 lb (68 kg).  Physical Examination: General appearance - alert, well appearing, and in no distress Mental status - alert, oriented to person, place, and time Skin- Healthy appearing tissues to inner thighs and external genitalia  Points of perceived tenderness , no skin changes local heat, or desquamation, with pt having very little skin fat beneath ischial tuberosities. External genitalia- cluster of sebaceous cysts on the left labia majora   Assessment & Plan:   A:  1. F/u of contact dermatitis - resolved  2. Skin sensitivity over sacral promontory d/t prolonged sitting    3. Sebaceous cyst   P:  1. Recommended gel cushion to disperse weight sitting down  2. Hot compresses to encourage drainage of sebaceous cysts     By signing my name below, I, Hansel Feinstein, attest that this documentation has been prepared under the direction and in the presence of Jonnie Kind, MD. Electronically Signed: Hansel Feinstein, ED Scribe. 06/26/16. 4:03 PM.  I personally performed the services described in this documentation, which was SCRIBED in my presence. The recorded information has been reviewed and considered accurate. It has been edited as necessary during review. Jonnie Kind, MD    Patient returns from waiting room with recurrent questions, answers patiently given.

## 2016-06-26 NOTE — Patient Instructions (Signed)
Please get a gel seat (can be found at [facility]) that will spread weight out more evenly while sitting down.

## 2016-07-01 DIAGNOSIS — I10 Essential (primary) hypertension: Secondary | ICD-10-CM | POA: Diagnosis not present

## 2016-07-01 DIAGNOSIS — D509 Iron deficiency anemia, unspecified: Secondary | ICD-10-CM | POA: Diagnosis not present

## 2016-07-02 DIAGNOSIS — D509 Iron deficiency anemia, unspecified: Secondary | ICD-10-CM | POA: Diagnosis not present

## 2016-07-02 DIAGNOSIS — N183 Chronic kidney disease, stage 3 (moderate): Secondary | ICD-10-CM | POA: Diagnosis not present

## 2016-07-02 DIAGNOSIS — N3281 Overactive bladder: Secondary | ICD-10-CM | POA: Diagnosis not present

## 2016-07-02 DIAGNOSIS — I10 Essential (primary) hypertension: Secondary | ICD-10-CM | POA: Diagnosis not present

## 2016-07-09 DIAGNOSIS — M79674 Pain in right toe(s): Secondary | ICD-10-CM | POA: Diagnosis not present

## 2016-07-09 DIAGNOSIS — B351 Tinea unguium: Secondary | ICD-10-CM | POA: Diagnosis not present

## 2016-08-19 DIAGNOSIS — D649 Anemia, unspecified: Secondary | ICD-10-CM | POA: Diagnosis not present

## 2016-08-19 DIAGNOSIS — F5101 Primary insomnia: Secondary | ICD-10-CM | POA: Diagnosis not present

## 2016-08-19 DIAGNOSIS — I10 Essential (primary) hypertension: Secondary | ICD-10-CM | POA: Diagnosis not present

## 2016-08-19 DIAGNOSIS — M130 Polyarthritis, unspecified: Secondary | ICD-10-CM | POA: Diagnosis not present

## 2016-08-19 DIAGNOSIS — N183 Chronic kidney disease, stage 3 (moderate): Secondary | ICD-10-CM | POA: Diagnosis not present

## 2016-08-23 ENCOUNTER — Other Ambulatory Visit: Payer: Self-pay | Admitting: Gastroenterology

## 2016-08-26 ENCOUNTER — Other Ambulatory Visit: Payer: Self-pay

## 2016-09-06 DIAGNOSIS — R809 Proteinuria, unspecified: Secondary | ICD-10-CM | POA: Diagnosis not present

## 2016-09-06 DIAGNOSIS — I10 Essential (primary) hypertension: Secondary | ICD-10-CM | POA: Diagnosis not present

## 2016-09-06 DIAGNOSIS — Z79899 Other long term (current) drug therapy: Secondary | ICD-10-CM | POA: Diagnosis not present

## 2016-09-06 DIAGNOSIS — N183 Chronic kidney disease, stage 3 (moderate): Secondary | ICD-10-CM | POA: Diagnosis not present

## 2016-09-11 DIAGNOSIS — N183 Chronic kidney disease, stage 3 (moderate): Secondary | ICD-10-CM | POA: Diagnosis not present

## 2016-09-11 DIAGNOSIS — M109 Gout, unspecified: Secondary | ICD-10-CM | POA: Diagnosis not present

## 2016-09-11 DIAGNOSIS — I10 Essential (primary) hypertension: Secondary | ICD-10-CM | POA: Diagnosis not present

## 2016-09-11 DIAGNOSIS — R809 Proteinuria, unspecified: Secondary | ICD-10-CM | POA: Diagnosis not present

## 2016-09-17 DIAGNOSIS — B351 Tinea unguium: Secondary | ICD-10-CM | POA: Diagnosis not present

## 2016-09-17 DIAGNOSIS — M79674 Pain in right toe(s): Secondary | ICD-10-CM | POA: Diagnosis not present

## 2016-11-07 DIAGNOSIS — Z23 Encounter for immunization: Secondary | ICD-10-CM | POA: Diagnosis not present

## 2016-11-20 DIAGNOSIS — I10 Essential (primary) hypertension: Secondary | ICD-10-CM | POA: Diagnosis not present

## 2016-11-20 DIAGNOSIS — E782 Mixed hyperlipidemia: Secondary | ICD-10-CM | POA: Diagnosis not present

## 2016-11-22 ENCOUNTER — Other Ambulatory Visit: Payer: Self-pay | Admitting: Gastroenterology

## 2016-11-22 DIAGNOSIS — N183 Chronic kidney disease, stage 3 (moderate): Secondary | ICD-10-CM | POA: Diagnosis not present

## 2016-11-22 DIAGNOSIS — N3281 Overactive bladder: Secondary | ICD-10-CM | POA: Diagnosis not present

## 2016-11-22 DIAGNOSIS — I10 Essential (primary) hypertension: Secondary | ICD-10-CM | POA: Diagnosis not present

## 2016-11-22 DIAGNOSIS — D509 Iron deficiency anemia, unspecified: Secondary | ICD-10-CM | POA: Diagnosis not present

## 2016-12-12 DIAGNOSIS — H5213 Myopia, bilateral: Secondary | ICD-10-CM | POA: Diagnosis not present

## 2016-12-12 DIAGNOSIS — H35372 Puckering of macula, left eye: Secondary | ICD-10-CM | POA: Diagnosis not present

## 2016-12-12 DIAGNOSIS — H26491 Other secondary cataract, right eye: Secondary | ICD-10-CM | POA: Diagnosis not present

## 2016-12-12 DIAGNOSIS — H401132 Primary open-angle glaucoma, bilateral, moderate stage: Secondary | ICD-10-CM | POA: Diagnosis not present

## 2016-12-17 DIAGNOSIS — B351 Tinea unguium: Secondary | ICD-10-CM | POA: Diagnosis not present

## 2016-12-17 DIAGNOSIS — B372 Candidiasis of skin and nail: Secondary | ICD-10-CM | POA: Diagnosis not present

## 2016-12-17 DIAGNOSIS — M79674 Pain in right toe(s): Secondary | ICD-10-CM | POA: Diagnosis not present

## 2017-02-11 DIAGNOSIS — M1A0720 Idiopathic chronic gout, left ankle and foot, without tophus (tophi): Secondary | ICD-10-CM | POA: Diagnosis not present

## 2017-02-11 DIAGNOSIS — M79673 Pain in unspecified foot: Secondary | ICD-10-CM | POA: Diagnosis not present

## 2017-02-22 DIAGNOSIS — N3281 Overactive bladder: Secondary | ICD-10-CM | POA: Diagnosis not present

## 2017-02-22 DIAGNOSIS — I10 Essential (primary) hypertension: Secondary | ICD-10-CM | POA: Diagnosis not present

## 2017-02-22 DIAGNOSIS — K5900 Constipation, unspecified: Secondary | ICD-10-CM | POA: Diagnosis not present

## 2017-02-22 DIAGNOSIS — D509 Iron deficiency anemia, unspecified: Secondary | ICD-10-CM | POA: Diagnosis not present

## 2017-02-25 DIAGNOSIS — M79674 Pain in right toe(s): Secondary | ICD-10-CM | POA: Diagnosis not present

## 2017-02-25 DIAGNOSIS — B351 Tinea unguium: Secondary | ICD-10-CM | POA: Diagnosis not present

## 2017-02-26 ENCOUNTER — Ambulatory Visit: Payer: Medicare Other | Admitting: Gastroenterology

## 2017-02-26 ENCOUNTER — Encounter: Payer: Self-pay | Admitting: Gastroenterology

## 2017-02-26 DIAGNOSIS — K582 Mixed irritable bowel syndrome: Secondary | ICD-10-CM

## 2017-02-26 NOTE — Patient Instructions (Addendum)
DRINK WATER TO KEEP YOUR URINE LIGHT YELLOW.  CHERRY JUICE, COFFEE, AND APPLES ARE OK. HOLD OFF ON PRUNE JUICE.   USE BENEFIBER ONCE OR TWICE DAILY. AVOID HIGHER DOSE IF IT CAUSES BLOATING & GAS.  TAKE LINZESS WITH MEALS EVERY MON, WED, & Friday AND IF NEEDED ON SAT OR SUN. OPEN LINZESS CAPSULE. PLACE GRANULES IN 4 TEASPOONS OF WATER. STIR IT FOR 30 SECONDS. TAKE 2 TSP OF THE WATER EVERY MON WED AND FRI AND ON SAT OR SUN IF NEEDED. KEEP THE ADDITIONAL 2 TSP COVERED IN A GLASS. YOU DO NOT NEED TO TAKE THE GRANULES THE MEDICINE IS IN THE WATER. IT MAY CAUSE EXPLOSIVE DIARRHEA.   PLEASE CALL IN ONE MONTH IF YOUR SYMPTOMS ARE NOT IMPROVED.   FOLLOW UP IN 3 MOS.

## 2017-02-26 NOTE — Progress Notes (Signed)
Subjective:    Patient ID: Deborah Farrell, female    DOB: Jun 03, 1917, 84 y.o.   MRN: [id number]  Celene Squibb, MD  HPI HAD TO  MOVE IN WITH HER NIECE. BOWELS GOT BLOCKED UP. THEN ON SOLUMEDROL FOR GOUT. TOOK MIRALAX AND GOT REAL BAD DIARRHEA. SEEN BY PCP. TOOK BENEFIBER AND DIARRHEA STOPPED. WOULD LIKE SOMETHING TO HELP BOWELS MOVE ALONG GENTLY. WENT TO DOCTOR TUES FOR TOENAILS. GOUT  PAIN IS BETTER. WANTS TO DRINK CHERRY JUICE, COFFEE, APPLES, OR PRUNE JUICE. APPETITE: GOOD  PT DENIES FEVER, CHILLS, HEMATOCHEZIA, HEMATEMESIS, nausea, vomiting, melena, diarrhea, CHEST PAIN, SHORTNESS OF BREATH,  CHANGE IN BOWEL IN HABITS, constipation, abdominal pain, problems swallowing, problems with sedation, heartburn or indigestion.  Past Medical History:  Diagnosis Date  . Allergy history, radiographic dye   . Arthritis   . Dysuria   . Glaucoma   . HTN (hypertension)   . IBS (irritable bowel syndrome)    Non-ulcer dyspepsia; hemorrhoids  . Overactive bladder   . 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 Urinary frequency   . Urinary tract infection     Past Surgical History:  Procedure Laterality Date  . APPENDECTOMY    . CHOLECYSTECTOMY    . COLONOSCOPY  2008, 2011   diverticulitis diagnosed, 21mm sessile polyp removed inascending colon. area tattood for future observation and reccomended f/u colonoscopy in one year  . COLONOSCOPY N/A 04/24/2012   SLF:The colon IS redundant/ Single RECTAL polyp/Moderate diverticulosis/Small internal hemorrhoids  . OVARIAN CYST REMOVAL    . VAGINAL HYSTERECTOMY      Allergies  Allergen Reactions  . Tramadol Anaphylaxis  . Aciphex [Rabeprazole] Other (See Comments)    intolerance  . Colchicine   . Dicyclomine Hcl Other (See Comments)    Unknown  . Hyoscyamine Sulfate Other (See Comments)    Unknown  . Iodinated Diagnostic Agents    . Nexium [Esomeprazole Magnesium] Other (See Comments)    G.I. Upset  . Omeprazole Other (See Comments)    GI upset  . Sulfonamide Derivatives Other (See Comments)    Unknown    Current Outpatient Medications  Medication Sig Dispense Refill  . acetaminophen (TYLENOL) 500 MG tablet Take 650 mg by mouth as needed.     Marland Kitchen amLODipine-valsartan (EXFORGE) 5-320 MG per tablet Take 0.5 tablets by mouth daily.     . carvedilol (COREG) 12.5 MG tablet Take 1 tablet (12.5 mg total) by mouth 2 (two) times daily with a meal. TAKE 6.25 IN THE MORNING(1/2 TABLET) TAKE 12.5 IN THE EVENING.(1 TABLET)    . chlorthalidone (HYGROTON) 25 MG tablet Take 12.5 mg by mouth daily. Take 1/2 tab    . estradiol (ESTRACE VAGINAL) 0.1 MG/GM vaginal cream Place 1 Applicatorful vaginally daily. (Patient taking differently: Place 1 Applicatorful vaginally as needed. )    . latanoprost (XALATAN) 0.005 % ophthalmic solution Place 1 drop into both eyes at bedtime.      . Multiple Vitamins-Minerals (MULTI COMPLETE PO) Take 1 tablet by mouth daily.     Marland Kitchen MYRBETRIQ 25 MG TB24 tablet TK 1 T PO D    . pantoprazole (PROTONIX) 40 MG tablet TAKE 1 TABLET BY MOUTH DAILY    . Probiotic Product (ALIGN PO) Take 1 tablet by mouth daily.     . prochlorperazine (COMPAZINE) 5 MG tablet Take 1 tablet (5 mg total) by mouth every  6 (six) hours as needed for nausea or vomiting.     Review of Systems PER HPI OTHERWISE ALL SYSTEMS ARE NEGATIVE.    Objective:   Physical Exam  Constitutional: She is oriented to person, place, and time. She appears well-developed and well-nourished. No distress.  HENT:  Head: Normocephalic and atraumatic.  Mouth/Throat: Oropharynx is clear and moist. No oropharyngeal exudate.  Eyes: Pupils are equal, round, and reactive to light. No scleral icterus.  Neck: Normal range of motion. Neck supple.  Cardiovascular: Normal rate, regular rhythm and normal heart sounds.  Pulmonary/Chest: Effort normal and breath sounds  normal. No respiratory distress.  Abdominal: Soft. Bowel sounds are normal. She exhibits no distension. There is no tenderness.  Musculoskeletal: She exhibits no edema.  WALKS ASSISTED WITH A CANE.  Lymphadenopathy:    She has no cervical adenopathy.  Neurological: She is alert and oriented to person, place, and time.  NO  NEW FOCAL DEFICITS  Psychiatric: She has a normal mood and affect.  Vitals reviewed.     Assessment & Plan:

## 2017-02-26 NOTE — Assessment & Plan Note (Signed)
TROUBLE WITH CONSTIPATION AND THEN DIARRHEA. SYMPTOMS FAIRLY WELL CONTROLLED NOW.   DRINK WATER TO KEEP YOUR URINE LIGHT YELLOW. CHERRY JUICE, COFFEE, AND APPLES ARE OK. HOLD OFF ON PRUNE JUICE.  USE BENEFIBER ONCE OR TWICE DAILY. AVOID HIGHER DOSE IF IT CAUSES BLOATING & GAS. TAKE LINZESS WITH MEALS EVERY MON, WED, & Friday AND IF NEEDED ON SAT OR SUN. OPEN LINZESS CAPSULE. PLACE GRANULES IN 4 TEASPOONS OF WATER. STIR IT FOR 30 SECONDS. TAKE 2 TSP OF THE WATER EVERY MON WED AND FRI AND ON SAT OR SUN IF NEEDED. KEEP THE ADDITIONAL 2 TSP COVERED IN A GLASS. YOU DO NOT NEED TO TAKE THE GRANULES THE MEDICINE IS IN THE WATER. IT MAY CAUSE EXPLOSIVE DIARRHEA. PLEASE CALL IN ONE MONTH IF YOUR SYMPTOMS ARE NOT IMPROVED.  FOLLOW UP IN 3 MOS.

## 2017-02-27 NOTE — Progress Notes (Signed)
CC'D TO PCP °

## 2017-02-27 NOTE — Progress Notes (Signed)
ON RECALL  °

## 2017-03-04 ENCOUNTER — Telehealth: Payer: Self-pay

## 2017-03-04 MED ORDER — LIDOCAINE-HYDROCORTISONE ACE 3-2.5 % RE KIT
PACK | RECTAL | 3 refills | Status: DC
Start: 2017-03-04 — End: 2018-02-05

## 2017-03-04 NOTE — Telephone Encounter (Signed)
Pt's niece called and said pt has irritated bottom and needs something sent to pharmacy. She is not having diarrhea now, but has had some and bottom got very irritated.  Wants to know if Dr. Oneida Alar can send in something to the pharmacy for her.

## 2017-03-04 NOTE — Telephone Encounter (Signed)
PLEASE CALL PT'S NIECE. SENT HEMORRHOID CREAM TO [facility] TO BE COMPOUNDED. IF IT'S TOO EXPENSIVE SHE CAN USE WITCH [employer] PADS AND PREPARATION H OINTMENT.

## 2017-03-04 NOTE — Telephone Encounter (Signed)
PT's niece is aware.

## 2017-03-21 DIAGNOSIS — Z1383 Encounter for screening for respiratory disorder NEC: Secondary | ICD-10-CM | POA: Diagnosis not present

## 2017-03-21 DIAGNOSIS — I10 Essential (primary) hypertension: Secondary | ICD-10-CM | POA: Diagnosis not present

## 2017-03-21 DIAGNOSIS — D509 Iron deficiency anemia, unspecified: Secondary | ICD-10-CM | POA: Diagnosis not present

## 2017-03-21 DIAGNOSIS — N183 Chronic kidney disease, stage 3 (moderate): Secondary | ICD-10-CM | POA: Diagnosis not present

## 2017-03-24 DIAGNOSIS — F419 Anxiety disorder, unspecified: Secondary | ICD-10-CM | POA: Diagnosis not present

## 2017-03-24 DIAGNOSIS — L989 Disorder of the skin and subcutaneous tissue, unspecified: Secondary | ICD-10-CM | POA: Diagnosis not present

## 2017-03-24 DIAGNOSIS — D509 Iron deficiency anemia, unspecified: Secondary | ICD-10-CM | POA: Diagnosis not present

## 2017-03-24 DIAGNOSIS — I10 Essential (primary) hypertension: Secondary | ICD-10-CM | POA: Diagnosis not present

## 2017-03-25 DIAGNOSIS — Z111 Encounter for screening for respiratory tuberculosis: Secondary | ICD-10-CM | POA: Diagnosis not present

## 2017-03-27 ENCOUNTER — Ambulatory Visit: Payer: Medicare Other | Admitting: Cardiology

## 2017-03-27 ENCOUNTER — Encounter: Payer: Self-pay | Admitting: Cardiology

## 2017-03-27 VITALS — BP 112/64 | HR 63 | Ht 64.0 in | Wt 143.0 lb

## 2017-03-27 DIAGNOSIS — E782 Mixed hyperlipidemia: Secondary | ICD-10-CM

## 2017-03-27 DIAGNOSIS — I10 Essential (primary) hypertension: Secondary | ICD-10-CM | POA: Diagnosis not present

## 2017-03-27 DIAGNOSIS — R002 Palpitations: Secondary | ICD-10-CM | POA: Diagnosis not present

## 2017-03-27 NOTE — Patient Instructions (Signed)
Medication Instructions:  Your physician recommends that you continue on your current medications as directed. Please refer to the Current Medication list given to you today.   Labwork: I WILL REQUEST LABS FROM PCP.   Testing/Procedures: NONE  Follow-Up: Your physician recommends that you schedule a follow-up appointment in: AS NEEDED   Any Other Special Instructions Will Be Listed Below (If Applicable).     If you need a refill on your cardiac medications before your next appointment, please call your pharmacy.   

## 2017-03-27 NOTE — Progress Notes (Signed)
Clinical Summary Deborah Farrell is a 84 y.o.female seen today for follow up of the following medical problems.   1. HTN -compliant with meds  2. Hyperlipidemia - followed by pcp, has not been on statin   3. Palpitations - occasional palpitations in the morning. She associates it with taking her mybetriq at night time. Does not have symptoms at any other time of day.   - no recent symptoms since last visit  Past Medical History:  Diagnosis Date  . Allergy history, radiographic dye   . Arthritis   . Dysuria   . Glaucoma   . HTN (hypertension)   . IBS (irritable bowel syndrome)    Non-ulcer dyspepsia; hemorrhoids  . Overactive bladder   . 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 Urinary frequency   . Urinary tract infection      Allergies  Allergen Reactions  . Tramadol Anaphylaxis  . Aciphex [Rabeprazole] Other (See Comments)    intolerance  . Colchicine   . Dicyclomine Hcl Other (See Comments)    Unknown  . Hyoscyamine Sulfate Other (See Comments)    Unknown  . Iodinated Diagnostic Agents   . Nexium [Esomeprazole Magnesium] Other (See Comments)    G.I. Upset  . Omeprazole Other (See Comments)    GI upset  . Sulfonamide Derivatives Other (See Comments)    Unknown     Current Outpatient Medications  Medication Sig Dispense Refill  . acetaminophen (TYLENOL) 500 MG tablet Take 650 mg by mouth as needed.     .Marland KitchenamLODipine-valsartan (EXFORGE) 5-320 MG per tablet Take 0.5 tablets by mouth daily.     . carvedilol (COREG) 12.5 MG tablet Take 1 tablet (12.5 mg total) by mouth 2 (two) times daily with a meal. TAKE 6.25 IN THE MORNING(1/2 TABLET) TAKE 12.5 IN THE EVENING.(1 TABLET) 180 tablet 3  . chlorthalidone (HYGROTON) 25 MG tablet Take 12.5 mg by mouth daily. Take 1/2 tab    . estradiol (ESTRACE VAGINAL) 0.1 MG/GM vaginal cream Place 1  Applicatorful vaginally daily. (Patient taking differently: Place 1 Applicatorful vaginally as needed. ) 4 g 0  . latanoprost (XALATAN) 0.005 % ophthalmic solution Place 1 drop into both eyes at bedtime.      . Lidocaine-Hydrocortisone Ace 3-2.5 % KIT APPLY TO RECTUM QID AS NEEDED FOR RECTAL PAIN OR BLEEDING 1 each 3  . Multiple Vitamins-Minerals (MULTI COMPLETE PO) Take 1 tablet by mouth daily.     .Marland KitchenMYRBETRIQ 25 MG TB24 tablet TK 1 T PO D  6  . pantoprazole (PROTONIX) 40 MG tablet TAKE 1 TABLET BY MOUTH DAILY 90 tablet 3  . PRESCRIPTION MEDICATION Takes Iron once a day (unsure of dose)    . Probiotic Product (ALIGN PO) Take 1 tablet by mouth daily.     . prochlorperazine (COMPAZINE) 5 MG tablet Take 1 tablet (5 mg total) by mouth every 6 (six) hours as needed for nausea or vomiting. 180 tablet 3   No current facility-administered medications for this visit.      Past Surgical History:  Procedure Laterality Date  . APPENDECTOMY    . CHOLECYSTECTOMY    . COLONOSCOPY  2008, 2011   diverticulitis diagnosed, 162msessile polyp removed inascending colon. area tattood for future observation and reccomended f/u colonoscopy in one year  . COLONOSCOPY N/A 04/24/2012   SLF:The colon IS redundant/ Single RECTAL polyp/Moderate  diverticulosis/Small internal hemorrhoids  . OVARIAN CYST REMOVAL    . VAGINAL HYSTERECTOMY       Allergies  Allergen Reactions  . Tramadol Anaphylaxis  . Aciphex [Rabeprazole] Other (See Comments)    intolerance  . Colchicine   . Dicyclomine Hcl Other (See Comments)    Unknown  . Hyoscyamine Sulfate Other (See Comments)    Unknown  . Iodinated Diagnostic Agents   . Nexium [Esomeprazole Magnesium] Other (See Comments)    G.I. Upset  . Omeprazole Other (See Comments)    GI upset  . Sulfonamide Derivatives Other (See Comments)    Unknown      Family History  Problem Relation Age of Onset  . Stroke Mother   . Heart attack Sister   . Heart disease Sister   .  Cancer Maternal Grandfather      Social History Ms. Wadsworth reports that  has never smoked. she has never used smokeless tobacco. Ms. Hemrick reports that she does not drink alcohol.   Review of Systems CONSTITUTIONAL: No weight loss, fever, chills, weakness or fatigue.  HEENT: Eyes: No visual loss, blurred vision, double vision or yellow sclerae.No hearing loss, sneezing, congestion, runny nose or sore throat.  SKIN: No rash or itching.  CARDIOVASCULAR: per hpi RESPIRATORY: No shortness of breath, cough or sputum.  GASTROINTESTINAL: No anorexia, nausea, vomiting or diarrhea. No abdominal pain or blood.  GENITOURINARY: No burning on urination, no polyuria NEUROLOGICAL: No headache, dizziness, syncope, paralysis, ataxia, numbness or tingling in the extremities. No change in bowel or bladder control.  MUSCULOSKELETAL: No muscle, back pain, joint pain or stiffness.  LYMPHATICS: No enlarged nodes. No history of splenectomy.  PSYCHIATRIC: No history of depression or anxiety.  ENDOCRINOLOGIC: No reports of sweating, cold or heat intolerance. No polyuria or polydipsia.  Marland Kitchen   Physical Examination Vitals:   03/27/17 1157  BP: 112/64  Pulse: 63  SpO2: 97%   Vitals:   03/27/17 1157  Weight: 143 lb (64.9 kg)  Height: '5\' 4"'$  (1.626 m)    Gen: resting comfortably, no acute distress HEENT: no scleral icterus, pupils equal round and reactive, no palptable cervical adenopathy,  CV: RRR, no m/r/g, no jvd Resp: Clear to auscultation bilaterally GI: abdomen is soft, non-tender, non-distended, normal bowel sounds, no hepatosplenomegaly MSK: extremities are warm, no edema.  Skin: warm, no rash Neuro:  no focal deficits Psych: appropriate affect   Diagnostic Studies     Assessment and Plan  1. HTN - at goal, continue current meds  2. HL -we will ask for labs from pcp   3. Palpitations - no recent symptoms, continue to monitor.    F/u as needed   Arnoldo Lenis, M.D

## 2017-03-28 MED ORDER — CARVEDILOL 12.5 MG PO TABS: 12.5000 mg | ORAL_TABLET | Freq: Two times a day (BID) | ORAL | 3 refills | Status: DC

## 2017-03-28 MED ORDER — CARVEDILOL 12.5 MG PO TABS: ORAL_TABLET | ORAL | 3 refills | Status: DC

## 2017-03-31 ENCOUNTER — Encounter: Payer: Self-pay | Admitting: Cardiology

## 2017-04-09 ENCOUNTER — Encounter: Payer: Self-pay | Admitting: Gastroenterology

## 2017-04-11 DIAGNOSIS — F5101 Primary insomnia: Secondary | ICD-10-CM | POA: Diagnosis not present

## 2017-04-11 DIAGNOSIS — D509 Iron deficiency anemia, unspecified: Secondary | ICD-10-CM | POA: Diagnosis not present

## 2017-04-11 DIAGNOSIS — R55 Syncope and collapse: Secondary | ICD-10-CM | POA: Diagnosis not present

## 2017-04-11 DIAGNOSIS — I10 Essential (primary) hypertension: Secondary | ICD-10-CM | POA: Diagnosis not present

## 2017-04-25 DIAGNOSIS — N3281 Overactive bladder: Secondary | ICD-10-CM | POA: Diagnosis not present

## 2017-04-25 DIAGNOSIS — I10 Essential (primary) hypertension: Secondary | ICD-10-CM | POA: Diagnosis not present

## 2017-04-25 DIAGNOSIS — F5101 Primary insomnia: Secondary | ICD-10-CM | POA: Diagnosis not present

## 2017-04-25 DIAGNOSIS — D509 Iron deficiency anemia, unspecified: Secondary | ICD-10-CM | POA: Diagnosis not present

## 2017-04-29 DIAGNOSIS — F5101 Primary insomnia: Secondary | ICD-10-CM | POA: Diagnosis not present

## 2017-04-29 DIAGNOSIS — I10 Essential (primary) hypertension: Secondary | ICD-10-CM | POA: Diagnosis not present

## 2017-04-29 DIAGNOSIS — D649 Anemia, unspecified: Secondary | ICD-10-CM | POA: Diagnosis not present

## 2017-04-29 DIAGNOSIS — N183 Chronic kidney disease, stage 3 (moderate): Secondary | ICD-10-CM | POA: Diagnosis not present

## 2017-05-06 DIAGNOSIS — B351 Tinea unguium: Secondary | ICD-10-CM | POA: Diagnosis not present

## 2017-05-06 DIAGNOSIS — M79674 Pain in right toe(s): Secondary | ICD-10-CM | POA: Diagnosis not present

## 2017-05-28 DIAGNOSIS — D509 Iron deficiency anemia, unspecified: Secondary | ICD-10-CM | POA: Diagnosis not present

## 2017-05-28 DIAGNOSIS — I10 Essential (primary) hypertension: Secondary | ICD-10-CM | POA: Diagnosis not present

## 2017-05-28 DIAGNOSIS — N183 Chronic kidney disease, stage 3 (moderate): Secondary | ICD-10-CM | POA: Diagnosis not present

## 2017-05-28 DIAGNOSIS — F5101 Primary insomnia: Secondary | ICD-10-CM | POA: Diagnosis not present

## 2017-05-28 DIAGNOSIS — E782 Mixed hyperlipidemia: Secondary | ICD-10-CM | POA: Diagnosis not present

## 2017-06-05 DIAGNOSIS — H26491 Other secondary cataract, right eye: Secondary | ICD-10-CM | POA: Diagnosis not present

## 2017-06-05 DIAGNOSIS — H35372 Puckering of macula, left eye: Secondary | ICD-10-CM | POA: Diagnosis not present

## 2017-06-05 DIAGNOSIS — H52223 Regular astigmatism, bilateral: Secondary | ICD-10-CM | POA: Diagnosis not present

## 2017-06-05 DIAGNOSIS — H401132 Primary open-angle glaucoma, bilateral, moderate stage: Secondary | ICD-10-CM | POA: Diagnosis not present

## 2017-06-15 ENCOUNTER — Other Ambulatory Visit: Payer: Self-pay | Admitting: Obstetrics and Gynecology

## 2017-06-20 ENCOUNTER — Telehealth: Payer: Self-pay | Admitting: Gastroenterology

## 2017-06-20 MED ORDER — LINACLOTIDE 72 MCG PO CAPS: ORAL_CAPSULE | ORAL | 11 refills | Status: DC

## 2017-06-20 NOTE — Addendum Note (Signed)
Addended by: Danie Binder on: 06/20/2017 02:21 PM   Modules accepted: Orders

## 2017-06-20 NOTE — Telephone Encounter (Addendum)
CALLED NIECE. SENT RX IN FOR [place]. DRINK WATER. EAT FIBER.

## 2017-06-20 NOTE — Telephone Encounter (Signed)
Pt called asking to speak with DS. DS not available at the moment. Please call pt at [phone]

## 2017-06-20 NOTE — Telephone Encounter (Signed)
Pt said she has not had a BM since Sunday. She said it gets there, but will not come out. She has taken 3 doses of fiber. She is aware that Dr. Oneida Alar is at the hospital and we leave at noon. She uses [facility] on [address].

## 2017-06-21 ENCOUNTER — Emergency Department (HOSPITAL_COMMUNITY)
Admission: EM | Admit: 2017-06-21 | Discharge: 2017-06-21 | Disposition: A | Discharge status: Home / Self Care | Payer: Medicare Other | Attending: Emergency Medicine | Admitting: Emergency Medicine

## 2017-06-21 ENCOUNTER — Encounter (HOSPITAL_COMMUNITY): Payer: Self-pay

## 2017-06-21 ENCOUNTER — Emergency Department (HOSPITAL_COMMUNITY): Payer: Medicare Other

## 2017-06-21 ENCOUNTER — Other Ambulatory Visit: Payer: Self-pay

## 2017-06-21 DIAGNOSIS — I10 Essential (primary) hypertension: Secondary | ICD-10-CM | POA: Diagnosis not present

## 2017-06-21 DIAGNOSIS — E785 Hyperlipidemia, unspecified: Secondary | ICD-10-CM | POA: Insufficient documentation

## 2017-06-21 DIAGNOSIS — Z79899 Other long term (current) drug therapy: Secondary | ICD-10-CM | POA: Diagnosis not present

## 2017-06-21 DIAGNOSIS — F0390 Unspecified dementia without behavioral disturbance: Secondary | ICD-10-CM | POA: Diagnosis not present

## 2017-06-21 DIAGNOSIS — K5900 Constipation, unspecified: Secondary | ICD-10-CM | POA: Insufficient documentation

## 2017-06-21 NOTE — ED Provider Notes (Signed)
[facility] EMERGENCY DEPARTMENT Provider Note   CSN: [id number] Arrival date & time: 06/21/17  0744     History   Chief Complaint Chief Complaint  Patient presents with  . Constipation    HPI Deborah Farrell is a 84 y.o. female.  Level 5 caveat for mild dementia.  Patient has not had a bowel movement in 6 days.  She was started on a new medication recently called Memantine for dementia.  Niece thinks this may be the cause.  No vomiting, diarrhea, fever, sweats, chills, dysuria.  Patient is eating normally.     Past Medical History:  Diagnosis Date  . Allergy history, radiographic dye   . Arthritis   . Dysuria   . Glaucoma   . HTN (hypertension)   . IBS (irritable bowel syndrome)    Non-ulcer dyspepsia; hemorrhoids  . Overactive bladder   . 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 Urinary frequency   . Urinary tract infection     Patient Active Problem List   Diagnosis Date Noted  . Nocturia 07/27/2014  . Recurrent cystitis 07/08/2013  . Atrophic vaginitis 07/08/2013  . Internal hemorrhoids with complication 017/51/0258 . Stiffness of joints, not elsewhere classified, multiple sites 11/19/2012  . Poor balance 11/19/2012  . Hip pain, right 11/20/2011  . Back pain, lumbosacral 11/20/2011  . Dyspepsia 07/19/2011  . Tubulovillous adenoma   . HYPERLIPIDEMIA 08/20/2007  . GLAUCOMA 08/20/2007  . HYPERTENSION 08/20/2007  . Gastroesophageal reflux disease 08/20/2007  . IRRITABLE BOWEL SYNDROME 08/20/2007    Past Surgical History:  Procedure Laterality Date  . APPENDECTOMY    . CHOLECYSTECTOMY    . COLONOSCOPY  2008, 2011   diverticulitis diagnosed, 153msessile polyp removed inascending colon. area tattood for future observation and reccomended f/u colonoscopy in one year  . COLONOSCOPY N/A 04/24/2012   SLF:The colon IS redundant/ Single RECTAL  polyp/Moderate diverticulosis/Small internal hemorrhoids  . OVARIAN CYST REMOVAL    . VAGINAL HYSTERECTOMY       OB History    Gravida  0   Para  0   Term  0   Preterm  0   AB  0   Living  0     SAB  0   TAB  0   Ectopic  0   Multiple  0   Live Births  0            Home Medications    Prior to Admission medications   Medication Sig Start Date End Date Taking? Authorizing Provider  acetaminophen (TYLENOL) 500 MG tablet Take 650 mg by mouth as needed.     [provider]  ALPRAZolam (XDuanne Moron0.5 MG tablet Take 0.5 mg by mouth at bedtime as needed for anxiety.    [provider]  amLODipine-valsartan (EXFORGE) 5-320 MG per tablet Take 0.5 tablets by mouth daily.     [provider]  carvedilol (COREG) 12.5 MG tablet Take 6.25 mg ( 1/2 tablet in the am) and 12. 5 mg (whole tablet) in the pm 03/28/17   BrArnoldo LenisMD  chlorthalidone (HYGROTON) 25 MG tablet Take 12.5 mg by mouth daily. Take 1/2 tab    [provider]  estradiol (ESTRACE VAGINAL) 0.1 MG/GM vaginal cream Place 1 Applicatorful vaginally daily. Patient taking differently: Place 1 Applicatorful vaginally as needed.  02/23/15   FeJonnie Kind  MD  hydrocortisone cream 1 % APPLY TO THE AFFECTED AREAS DAILY 06/16/17   Jonnie Kind, MD  latanoprost (XALATAN) 0.005 % ophthalmic solution Place 1 drop into both eyes at bedtime.      [provider]  Lidocaine-Hydrocortisone Ace 3-2.5 % KIT APPLY TO RECTUM QID AS NEEDED FOR RECTAL PAIN OR BLEEDING 03/04/17   Fields, Marga Melnick, MD  linaclotide (LINZESS) 72 MCG capsule 1 OR 2 DAILY FOR 3 DAYS THEN 1 PO DAILY IF NEEDED FOR CONSTIPATION 06/20/17   Fields, Marga Melnick, MD  MYRBETRIQ 25 MG TB24 tablet TK 1 T PO D 03/09/16   [provider]  pantoprazole (PROTONIX) 40 MG tablet TAKE 1 TABLET BY MOUTH DAILY 11/22/16   Mahala Menghini, PA-C  PRESCRIPTION MEDICATION Takes Iron once a day (unsure of dose)    [provider]  Probiotic Product (ALIGN PO) Take 1 tablet by mouth daily.     [provider]  prochlorperazine (COMPAZINE) 5 MG tablet Take 1 tablet (5 mg total) by mouth every 6 (six) hours as needed for nausea or vomiting. 02/26/16   Danie Binder, MD    Family History Family History  Problem Relation Age of Onset  . Stroke Mother   . Heart attack Sister   . Heart disease Sister   . Cancer Maternal Grandfather     Social History Social History   Tobacco Use  . Smoking status: Never Smoker  . Smokeless tobacco: Never Used  Substance Use Topics  . Alcohol use: No  . Drug use: No     Allergies   Tramadol; Aciphex [rabeprazole]; Colchicine; Dicyclomine hcl; Hyoscyamine sulfate; Iodinated diagnostic agents; Nexium [esomeprazole magnesium]; Omeprazole; and Sulfonamide derivatives   Review of Systems Review of Systems  Unable to perform ROS: Dementia     Physical Exam Updated Vital Signs BP (!) 109/54   Pulse (!) 58   Temp 98 F (36.7 C) (Oral)   Resp 18   Ht _0  (1.651 m)   Wt 64.4 kg (142 lb)   SpO2 98%   BMI 23.63 kg/m   Physical Exam  Constitutional: She is oriented to person, place, and time. She appears well-developed and well-nourished.  HENT:  Head: Normocephalic and atraumatic.  Eyes: Conjunctivae are normal.  Neck: Neck supple.  Cardiovascular: Normal rate and regular rhythm.  Pulmonary/Chest: Effort normal and breath sounds normal.  Abdominal: Soft. Bowel sounds are normal.  soft  Musculoskeletal: Normal range of motion.  Neurological: She is alert and oriented to person, place, and time.  Skin: Skin is warm and dry.  Psychiatric: She has a normal mood and affect. Her behavior is normal.  Mild dementia  Nursing note and vitals reviewed.    ED Treatments / Results  Labs (all labs ordered are listed, but only abnormal results are displayed) Labs Reviewed - No data to display  EKG None  Radiology Dg Abdomen 1 View  Result  Date: 06/21/2017 CLINICAL DATA:  Constipation EXAM: ABDOMEN - 1 VIEW COMPARISON:  12/25/2007 abdominal CT FINDINGS: Moderate stool volume seen throughout most colonic segments without over distension or rectal impaction. No small bowel distention. No concerning mass effect or gas collection. Cholecystectomy clips. Advanced spinal degeneration with scoliosis. IMPRESSION: Moderate stool burden.  No obstruction or rectal impaction. Electronically Signed   By: Monte Fantasia M.D.   On: 06/21/2017 09:01    Procedures Fecal disimpaction Date/Time: 06/21/2017 9:00 AM Performed by: Nat Christen, MD Authorized by: Nat Christen, MD  Consent: Verbal consent obtained. Risks and benefits: risks, benefits and alternatives were discussed Consent given by: patient and guardian Patient understanding: patient states understanding of the procedure being performed Relevant documents: relevant documents present and verified Imaging studies: imaging studies available Local anesthesia used: no  Anesthesia: Local anesthesia used: no  Sedation: Patient sedated: no  Patient tolerance: Patient tolerated the procedure well with no immediate complications Comments: Digital disimpaction performed with moderate amount of stool extracted.  Fleets enema was applied.  Patient had a large bowel movement after procedure.    (including critical care time)  Medications Ordered in ED Medications - No data to display   Initial Impression / Assessment and Plan / ED Course  I have reviewed the triage vital signs and the nursing notes.  Pertinent labs & imaging results that were available during my care of the patient were reviewed by me and considered in my medical decision making (see chart for details).     Patient presents with constipation for several days.  No acute abdomen.  Disimpaction was performed.  Fleets enema applied.  Patient feels much better at discharge.  Final Clinical Impressions(s) / ED Diagnoses    Final diagnoses:  Constipation, unspecified constipation type    ED Discharge Orders    None       Nat Christen, MD 06/21/17 1015

## 2017-06-21 NOTE — Discharge Instructions (Addendum)
Increase fluids, fruit, fiber.  Discuss your medications with the primary care doctor.  Try milk of magnesia, magnesium citrate, Dulcolax.  You can also consider an enema.

## 2017-06-21 NOTE — ED Triage Notes (Signed)
Pt reports has not had a bm since last sat or Sunday.  Says feels like she has stool in her rectum but can't have a bm.  Denies any urinary symptoms.  Denies abd pain.  Reports gas.

## 2017-06-23 ENCOUNTER — Telehealth: Payer: Self-pay | Admitting: Gastroenterology

## 2017-06-23 NOTE — Telephone Encounter (Signed)
CALLED PT'S NIECE. LVM TO CALL TO DISCUSS RESULTS. PT WENT TO ED FOR CONSTIPATION. SUGGESTED HALF DOSE MOM. CONTINUE HALF DOSE MOM DAILY. ADD LINZESS 72 MCG 1-2 TIMES A WEEK TO AVOID CONSTIPATION. #12 SAMPLES PUT AT [place].

## 2017-06-23 NOTE — Telephone Encounter (Signed)
I spoke with Izora Gala. She said pt had to go to the ED this weekend. She wants to know if Dr. Nona Dell still would like her to have the Linzess 72 mcg. I told her I will find out and we do have some samples if she does want her to have it.

## 2017-06-23 NOTE — Telephone Encounter (Signed)
Samples at front for pick up.

## 2017-06-23 NOTE — Telephone Encounter (Signed)
Pt's niece is aware and her husband will be by to pick up the samples.

## 2017-06-23 NOTE — Telephone Encounter (Signed)
Patient linzess is over 500$, they need another option. Please call them [phone].

## 2017-06-30 DIAGNOSIS — R809 Proteinuria, unspecified: Secondary | ICD-10-CM | POA: Diagnosis not present

## 2017-06-30 DIAGNOSIS — N183 Chronic kidney disease, stage 3 (moderate): Secondary | ICD-10-CM | POA: Diagnosis not present

## 2017-06-30 DIAGNOSIS — I10 Essential (primary) hypertension: Secondary | ICD-10-CM | POA: Diagnosis not present

## 2017-06-30 DIAGNOSIS — E559 Vitamin D deficiency, unspecified: Secondary | ICD-10-CM | POA: Diagnosis not present

## 2017-07-01 DIAGNOSIS — D649 Anemia, unspecified: Secondary | ICD-10-CM | POA: Diagnosis not present

## 2017-07-01 DIAGNOSIS — I10 Essential (primary) hypertension: Secondary | ICD-10-CM | POA: Diagnosis not present

## 2017-07-01 DIAGNOSIS — R809 Proteinuria, unspecified: Secondary | ICD-10-CM | POA: Diagnosis not present

## 2017-07-01 DIAGNOSIS — N183 Chronic kidney disease, stage 3 (moderate): Secondary | ICD-10-CM | POA: Diagnosis not present

## 2017-07-07 DIAGNOSIS — Z6822 Body mass index (BMI) 22.0-22.9, adult: Secondary | ICD-10-CM | POA: Diagnosis not present

## 2017-07-07 DIAGNOSIS — R55 Syncope and collapse: Secondary | ICD-10-CM | POA: Diagnosis not present

## 2017-07-07 DIAGNOSIS — I10 Essential (primary) hypertension: Secondary | ICD-10-CM | POA: Diagnosis not present

## 2017-07-15 ENCOUNTER — Encounter: Payer: Self-pay | Admitting: Gastroenterology

## 2017-07-15 DIAGNOSIS — M79675 Pain in left toe(s): Secondary | ICD-10-CM | POA: Diagnosis not present

## 2017-07-15 DIAGNOSIS — M79674 Pain in right toe(s): Secondary | ICD-10-CM | POA: Diagnosis not present

## 2017-07-15 DIAGNOSIS — B351 Tinea unguium: Secondary | ICD-10-CM | POA: Diagnosis not present

## 2017-07-18 DIAGNOSIS — G4700 Insomnia, unspecified: Secondary | ICD-10-CM | POA: Diagnosis not present

## 2017-07-18 DIAGNOSIS — R55 Syncope and collapse: Secondary | ICD-10-CM | POA: Diagnosis not present

## 2017-07-18 DIAGNOSIS — F419 Anxiety disorder, unspecified: Secondary | ICD-10-CM | POA: Diagnosis not present

## 2017-07-18 DIAGNOSIS — I10 Essential (primary) hypertension: Secondary | ICD-10-CM | POA: Diagnosis not present

## 2017-08-01 ENCOUNTER — Other Ambulatory Visit: Payer: Self-pay | Admitting: Gastroenterology

## 2017-08-05 ENCOUNTER — Telehealth: Payer: Self-pay

## 2017-08-05 NOTE — Telephone Encounter (Signed)
PT's niece, Gloriann Riede, called and said they requested refill on Compazine several days ago. Pt still had #8 tablets but gets from [facility] and it takes about a week. It is in the refill box, and we are short staffed, sending message to Walden Field, NP who is doing refills that pt will need soon.

## 2017-08-05 NOTE — Telephone Encounter (Signed)
Per Walden Field, NP he has just sent the Rx in and I called and informed Izora Gala.

## 2017-08-07 ENCOUNTER — Encounter: Payer: Self-pay | Admitting: Obstetrics and Gynecology

## 2017-08-07 ENCOUNTER — Ambulatory Visit: Payer: Medicare Other | Admitting: Obstetrics and Gynecology

## 2017-08-07 ENCOUNTER — Telehealth: Payer: Self-pay | Admitting: Gastroenterology

## 2017-08-07 VITALS — BP 98/60 | HR 66 | Ht 65.0 in | Wt 139.4 lb

## 2017-08-07 DIAGNOSIS — R110 Nausea: Secondary | ICD-10-CM

## 2017-08-07 DIAGNOSIS — N94818 Other vulvodynia: Secondary | ICD-10-CM | POA: Diagnosis not present

## 2017-08-07 NOTE — Telephone Encounter (Signed)
Pt.s niece called to speak with DS. She said that the pharmacy that the compazine was called into was on backorder and they don't know when they will get any in. Patient wants prescription called into [facility] on [address].

## 2017-08-07 NOTE — Telephone Encounter (Signed)
See note from 08/05/17. EG sent medication into pts pharmacy. Pt would like rx sent into [facility].

## 2017-08-07 NOTE — Progress Notes (Signed)
Patient ID: Deborah Farrell, female   DOB: [birth year], 84 y.o.   MRN: [id number]   [facility] Visit  _0 @            Patient name: Deborah Farrell MRN [id number]  Date of birth: [birth year]  CC & HPI:  Deborah Farrell is a 84 y.o. female presenting today for irritation underneath her buttocks and reports that it is worse on the right side. She was last seen on 05/28/2016 for contact dermatitis on the inner thighs and she used 1% hydrocortisone for it. However, she reports that she has not been using it correctly. She increased it to bid, and sx resolved. She says that her undergarments irritate the inside of her legs and she has tried many different kinds. She was prescribed Nystatin by another physician. The patient denies fever, chills or any other symptoms or complaints at this time.   ROS:  ROS + irritation underneath buttocks - fever - chills  All systems are negative except as noted in the HPI and PMH.   Pertinent History Reviewed:   Reviewed:  Medical         Past Medical History:  Diagnosis Date  . Allergy history, radiographic dye   . Arthritis   . Dysuria   . Glaucoma   . HTN (hypertension)   . IBS (irritable bowel syndrome)    Non-ulcer dyspepsia; hemorrhoids  . Overactive bladder   . 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 Urinary frequency   . Urinary tract infection                               Surgical Hx:    Past Surgical History:  Procedure Laterality Date  . APPENDECTOMY    . CHOLECYSTECTOMY    . COLONOSCOPY  2008, 2011   diverticulitis diagnosed, 174msessile polyp removed inascending colon. area tattood for future observation and reccomended f/u colonoscopy in one year  . COLONOSCOPY N/A 04/24/2012   SLF:The colon IS redundant/ Single RECTAL polyp/Moderate diverticulosis/Small internal hemorrhoids  . OVARIAN CYST REMOVAL    .  VAGINAL HYSTERECTOMY     Medications: Reviewed & Updated - see associated section                       Current Outpatient Medications:  .  acetaminophen (TYLENOL) 500 MG tablet, Take 650 mg by mouth as needed. , Disp: , Rfl:  .  ALPRAZolam (XANAX) 0.5 MG tablet, Take 0.5 mg by mouth at bedtime as needed for anxiety., Disp: , Rfl:  .  amLODipine-valsartan (EXFORGE) 5-320 MG per tablet, Take 0.5 tablets by mouth daily. , Disp: , Rfl:  .  chlorthalidone (HYGROTON) 25 MG tablet, Take 12.5 mg by mouth daily. Take 1/2 tab, Disp: , Rfl:  .  estradiol (ESTRACE VAGINAL) 0.1 MG/GM vaginal cream, Place 1 Applicatorful vaginally daily. (Patient taking differently: Place 1 Applicatorful vaginally as needed. ), Disp: 4 g, Rfl: 0 .  hydrocortisone cream 1 %, APPLY TO THE AFFECTED AREAS DAILY, Disp: 28.35 g, Rfl: 0 .  latanoprost (XALATAN) 0.005 % ophthalmic solution, Place 1 drop into both eyes at bedtime.  , Disp: , Rfl:  .  Lidocaine-Hydrocortisone Ace 3-2.5 % KIT, APPLY TO RECTUM QID AS NEEDED FOR RECTAL PAIN OR BLEEDING, Disp: 1 each, Rfl:  3 .  linaclotide (LINZESS) 72 MCG capsule, 1 OR 2 DAILY FOR 3 DAYS THEN 1 PO DAILY IF NEEDED FOR CONSTIPATION, Disp: 30 capsule, Rfl: 11 .  memantine (NAMENDA) 10 MG tablet, Take 10 mg by mouth 2 (two) times daily., Disp: , Rfl:  .  MYRBETRIQ 25 MG TB24 tablet, TK 1 T PO D, Disp: , Rfl: 6 .  pantoprazole (PROTONIX) 40 MG tablet, TAKE 1 TABLET BY MOUTH DAILY, Disp: 90 tablet, Rfl: 3 .  PRESCRIPTION MEDICATION, Takes Iron once a day (unsure of dose), Disp: , Rfl:  .  Probiotic Product (ALIGN PO), Take 1 tablet by mouth daily. , Disp: , Rfl:  .  prochlorperazine (COMPAZINE) 5 MG tablet, TAKE 1 TABLET BY MOUTH EVERY 6 HOURS AS NEEDED FOR NAUSEA OR VOMITING, Disp: 180 tablet, Rfl: 3 .  carvedilol (COREG) 12.5 MG tablet, Take 6.25 mg ( 1/2 tablet in the am) and 12. 5 mg (whole tablet) in the pm (Patient not taking: Reported on 08/07/2017), Disp: 135 tablet, Rfl: 3 .   trandolapril (MAVIK) 4 MG tablet, Take 4 mg by mouth daily., Disp: , Rfl:    Social History: Reviewed -  reports that she has never smoked. She has never used smokeless tobacco.  Objective Findings:  Vitals: Height _0  (1.651 m), weight 139 lb 6.4 oz (63.2 kg).  PHYSICAL EXAMINATION General appearance - alert, well appearing, and in no distress, oriented to person, place, and time and normal appearing weight Mental status - alert, oriented to person, place, and time, normal mood, behavior, speech, dress, motor activity, and thought processes, affect appropriate to mood  PELVIC External genitalia - plugged oil gland (taken care of during visit today) Vulva - normal Vagina - normal Cervix -  Uterus -  Adnexa -  Skin - looks "marvelous"  Assessment & Plan:   A:  1.  Normal skin with paresthesias 2. No evidence of acute infection  P:  1. Stop using 1% hydrocortisone, consider nystatin prn itching 2. Topical skin moisturizer prn   By signing my name below, I, De Burrs, attest that this documentation has been prepared under the direction and in the presence of Jonnie Kind, MD. Electronically Signed: De Burrs, Medical Scribe. 08/07/17. 1:21 PM.  I personally performed the services described in this documentation, which was SCRIBED in my presence. The recorded information has been reviewed and considered accurate. It has been edited as necessary during review. Jonnie Kind, MD

## 2017-08-13 MED ORDER — PROCHLORPERAZINE MALEATE 5 MG PO TABS: ORAL_TABLET | ORAL | 3 refills | Status: DC

## 2017-08-13 NOTE — Addendum Note (Signed)
Addended by: Gordy Levan, Evonne Rinks A on: 08/13/2017 04:38 PM   Modules accepted: Orders

## 2017-08-13 NOTE — Telephone Encounter (Signed)
Sent!

## 2017-08-18 ENCOUNTER — Ambulatory Visit (HOSPITAL_COMMUNITY): Payer: Medicare Other | Attending: Internal Medicine

## 2017-08-18 ENCOUNTER — Encounter (HOSPITAL_COMMUNITY): Payer: Self-pay

## 2017-08-18 DIAGNOSIS — M6281 Muscle weakness (generalized): Secondary | ICD-10-CM | POA: Diagnosis present

## 2017-08-18 DIAGNOSIS — R2689 Other abnormalities of gait and mobility: Secondary | ICD-10-CM | POA: Insufficient documentation

## 2017-08-18 DIAGNOSIS — R262 Difficulty in walking, not elsewhere classified: Secondary | ICD-10-CM

## 2017-08-18 NOTE — Therapy (Signed)
[facility] [address] [place], [place], [address] Phone: [phone]   Fax:  [phone]  Physical Therapy Evaluation  Patient Details  Name: Deborah Farrell MRN: [id number] Date of Birth: 82-Apr-1919 Referring Provider: Dr Allyn Kenner, MD   Encounter Date: 08/18/2017  PT End of Session - 08/18/17 1611    Visit Number  1    Number of Visits  16    Date for PT Re-Evaluation  10/13/17 mini-re-assess 09/15/17    Authorization Type  [employer] Medicare HMO; no deduct; copay $40.00 per visit; co insurance OOP [id number]    Authorization Time Period  01-14-17 thru 01-13-18    Authorization - Visit Number  1    Authorization - Number of Visits  10    PT Start Time  6962    PT Stop Time  1430    PT Time Calculation (min)  45 min    Activity Tolerance  Patient tolerated treatment well    Behavior During Therapy  [facility] for tasks assessed/performed       Past Medical History:  Diagnosis Date  . Allergy history, radiographic dye   . Arthritis   . Dysuria   . Glaucoma   . HTN (hypertension)   . IBS (irritable bowel syndrome)    Non-ulcer dyspepsia; hemorrhoids  . Overactive bladder   . Tubulovillous adenoma 2007   with multifocal high grade dysplasia-WFBH 2007;TV ADENOMA w/o dysplasia 2008;  SIMPLE ADENOMA 2009; 55mm SIMPLE ADENOMA JAN 2011  . Urinary frequency   . Urinary tract infection     Past Surgical History:  Procedure Laterality Date  . APPENDECTOMY    . CHOLECYSTECTOMY    . COLONOSCOPY  2008, 2011   diverticulitis diagnosed, 51mm sessile polyp removed inascending colon. area tattood for future observation and reccomended f/u colonoscopy in one year  . COLONOSCOPY N/A 04/24/2012   SLF:The colon IS redundant/ Single RECTAL polyp/Moderate diverticulosis/Small internal hemorrhoids  . OVARIAN CYST REMOVAL    . VAGINAL HYSTERECTOMY      There were no vitals filed for this visit.   Subjective Assessment - 08/18/17 1359    Subjective   Patient wants PT for weakness and knee pain. Difficulty walking, standing for a period of time causes back pain.     Patient is accompained by:  Family member    Limitations  Standing;Walking;House hold activities    How long can you stand comfortably?  5 minutes, limited by back and knee pain    How long can you walk comfortably?  with Rollator, can walk 400-500 feet before needing to rest due to pain    Patient Stated Goals  to be able to walk better with less pain    Currently in Pain?  Yes    Pain Score  8     Pain Location  Back    Pain Orientation  Right;Left;Lower    Pain Descriptors / Indicators  Aching;Dull    Pain Onset  More than a month ago    Pain Frequency  Intermittent    Aggravating Factors   standing, walking, unable to perform household activities    Pain Relieving Factors  resting, sitting down    Effect of Pain on Daily Activities  limits activity level         [facility] PT Assessment - 08/18/17 0001      Assessment   Medical Diagnosis  decreased mobility and weakness    Referring Provider  Dr Allyn Kenner, MD  Onset Date/Surgical Date  07/30/17      Home Environment   Additional Comments  lives with niece and her husband      Prior Function   Level of Independence  Independent with household mobility with device    Vocation  Retired    Leisure  visit with family      Cognition   Overall Cognitive Status  History of cognitive impairments - at baseline      Posture/Postural Control   Posture/Postural Control  Postural limitations    Postural Limitations  Rounded Shoulders;Forward head      Strength   Right Hip Flexion  4-/5    Right Hip Extension  3-/5    Right Hip ABduction  3+/5    Left Hip Flexion  4-/5    Left Hip Extension  3-/5    Left Hip ABduction  3+/5    Right Knee Flexion  4-/5    Right Knee Extension  4/5    Left Knee Flexion  4-/5    Left Knee Extension  4/5    Right Ankle Dorsiflexion  4/5    Left Ankle Dorsiflexion  4/5       Transfers   Five time sit to stand comments   32.83 no UE support      Ambulation/Gait   Ambulation/Gait  Yes    Ambulation/Gait Assistance  5: Supervision    Ambulation Distance (Feet)  110 Feet 2MWT    Gait velocity  0.31 m/s    Gait Comments  TUG - 59.52 sec                Objective measurements completed on examination: See above findings.              PT Education - 08/18/17 1610    Education Details  evaluation process, purpose of PT for functional strengthening and balance to improve functional abilities.    Person(s) Educated  Patient;Child(ren)    Methods  Explanation    Comprehension  Verbalized understanding       PT Short Term Goals - 08/18/17 1623      PT SHORT TERM GOAL #1   Title  Patient will be independent with HEP and perform regularly at home.     Time  4    Period  Weeks    Status  New    Target Date  10/13/17      PT SHORT TERM GOAL #2   Title  Patient will exhibit a 1/2 grade improvement in MMT for bilateral lower extremities to indicate improved strength and functional balance ability.     Time  4    Period  Weeks    Status  New      PT SHORT TERM GOAL #3   Title  Patient will complete TUG in <50 seconds to indicate improvement in functional balance and decreased risk for falls.     Time  4    Period  Weeks    Status  New      PT SHORT TERM GOAL #4   Title  Patient will improve 2MWT by 25 feet to indicate improved balance, functional strength and endurance for activity and decreased risk for falls.         PT Long Term Goals - 08/18/17 1626      PT LONG TERM GOAL #1   Title  Patient will be independent with advanced HEP and perform regularly at home.     Time  8    Period  Weeks    Status  New    Target Date  10/13/17      PT LONG TERM GOAL #2   Title  Patient will exhibit a 1 grade improvement in MMT for bilateral lower extremities to indicate improved strength and functional balance ability.     Time  8     Period  Weeks    Status  New      PT LONG TERM GOAL #3   Title  Patient will complete TUG in <40 seconds to indicate improvement in functional balance and decreased risk for falls.     Time  8    Period  Weeks    Status  New      PT LONG TERM GOAL #4   Title  Patient will improve 2MWT by 50 feet to indicate improved balance, functional strength and endurance for activity and decreased risk for falls.     Time  8    Period  Weeks    Status  New             Plan - 08/18/17 1614    Clinical Impression Statement  Patient is a 84 year old female evaluated for decreased mobilty and weakness. She presents with slow ambulation ambulating with SPC, increased pain in low back and bilateral knees, decreased strength, decreased balance, decreased functional ability. Niece reoprts she does have a Rollator. Patient is at increased risk for falls and associated injury possibilities. Patient would benefit from skilled physical therapy to address the aforementioned deficits.     History and Personal Factors relevant to plan of care:  HLD< HTN, Rt hip pain, LBP, GERD, IBS    Clinical Presentation  Stable    Clinical Presentation due to:  MMT, TUG, 2MWT, 5xSTS, clinical judgement    Clinical Decision Making  Low    Rehab Potential  Fair    PT Frequency  2x / week    PT Duration  4 weeks    PT Treatment/Interventions  ADLs/Self Care Home Management;Aquatic Therapy;DME Instruction;Gait training;Stair training;Functional mobility training;Therapeutic activities;Therapeutic exercise;Manual techniques;Patient/family education;Neuromuscular re-education;Balance training;Energy conservation    PT Next Visit Plan  initiate supine and seated strengthening exercises progressing into more weight bearing strengthening and balance exercises; transfer training.    Consulted and Agree with Plan of Care  Patient;Family member/caregiver    Family Member Consulted  niece       Patient will benefit from skilled  therapeutic intervention in order to improve the following deficits and impairments:  Abnormal gait, Decreased endurance, Decreased activity tolerance, Decreased strength, Pain, Decreased balance, Decreased mobility, Difficulty walking, Postural dysfunction  Visit Diagnosis: Difficulty in walking, not elsewhere classified - Plan: PT plan of care cert/re-cert  Muscle weakness (generalized) - Plan: PT plan of care cert/re-cert  Other abnormalities of gait and mobility - Plan: PT plan of care cert/re-cert     Problem List Patient Active Problem List   Diagnosis Date Noted  . Vulvar discomfort 08/07/2017  . Nocturia 07/27/2014  . Recurrent cystitis 07/08/2013  . Atrophic vaginitis 07/08/2013  . Internal hemorrhoids with complication 56/21/3086  . Stiffness of joints, not elsewhere classified, multiple sites 11/19/2012  . Poor balance 11/19/2012  . Hip pain, right 11/20/2011  . Back pain, lumbosacral 11/20/2011  . Dyspepsia 07/19/2011  . Tubulovillous adenoma   . HYPERLIPIDEMIA 08/20/2007  . GLAUCOMA 08/20/2007  . HYPERTENSION 08/20/2007  . Gastroesophageal reflux disease 08/20/2007  . IRRITABLE BOWEL SYNDROME 08/20/2007  Floria Raveling. Hartnett-Rands, MS, PT [facility] #[id number] 08/18/2017, 4:31 PM  [facility] [address] [place], [place], [address] Phone: [phone]   Fax:  [phone]  Name: GERRY HEAPHY MRN: [id number] Date of Birth: Aug 21, 1917

## 2017-08-20 ENCOUNTER — Ambulatory Visit (HOSPITAL_COMMUNITY): Payer: Medicare Other

## 2017-08-20 ENCOUNTER — Encounter (HOSPITAL_COMMUNITY): Payer: Self-pay

## 2017-08-20 DIAGNOSIS — R262 Difficulty in walking, not elsewhere classified: Secondary | ICD-10-CM | POA: Diagnosis not present

## 2017-08-20 DIAGNOSIS — M6281 Muscle weakness (generalized): Secondary | ICD-10-CM

## 2017-08-20 DIAGNOSIS — R2689 Other abnormalities of gait and mobility: Secondary | ICD-10-CM

## 2017-08-20 NOTE — Patient Instructions (Signed)
Log Roll    Lying on back, bend left knee and place left arm across chest. Roll all in one movement to the right. Reverse to roll to the left. Always move as one unit.   Copyright  VHI. All rights reserved.   Supine to Sit (Active)    Lie on back, left leg bent. Roll to other side. From side-lying, sit up on side of bed.   Copyright  VHI. All rights reserved.

## 2017-08-20 NOTE — Therapy (Signed)
[facility] [address] [place], [place], [address] Phone: [phone]   Fax:  [phone]  Physical Therapy Treatment  Patient Details  Name: Deborah Farrell MRN: [id number] Date of Birth: [birth year] Referring Provider: Dr Allyn Kenner, MD   Encounter Date: 08/20/2017  PT End of Session - 08/20/17 1258    Visit Number  2    Number of Visits  16    Date for PT Re-Evaluation  10/13/17 mini-re-assess 09/15/17    Authorization Type  [employer] Medicare HMO; no deduct; copay $40.00 per visit; co insurance OOP $[id number]    Authorization Time Period  01-14-17 thru 01-13-18    Authorization - Visit Number  2    Authorization - Number of Visits  10    Activity Tolerance  Patient tolerated treatment well    Behavior During Therapy  [facility] for tasks assessed/performed       Past Medical History:  Diagnosis Date  . Allergy history, radiographic dye   . Arthritis   . Dysuria   . Glaucoma   . HTN (hypertension)   . IBS (irritable bowel syndrome)    Non-ulcer dyspepsia; hemorrhoids  . Overactive bladder   . Tubulovillous adenoma 2007   with multifocal high grade dysplasia-WFBH 2007;TV ADENOMA w/o dysplasia 2008;  SIMPLE ADENOMA 2009; 70mm SIMPLE ADENOMA JAN 2011  . Urinary frequency   . Urinary tract infection     Past Surgical History:  Procedure Laterality Date  . APPENDECTOMY    . CHOLECYSTECTOMY    . COLONOSCOPY  2008, 2011   diverticulitis diagnosed, 74mm sessile polyp removed inascending colon. area tattood for future observation and reccomended f/u colonoscopy in one year  . COLONOSCOPY N/A 04/24/2012   SLF:The colon IS redundant/ Single RECTAL polyp/Moderate diverticulosis/Small internal hemorrhoids  . OVARIAN CYST REMOVAL    . VAGINAL HYSTERECTOMY      There were no vitals filed for this visit.  Subjective Assessment - 08/20/17 1258    Subjective  Patient reports she is stiff today with 8/10 LBP both sides. Rubbed Epsom salts onto both  knees before coming to therapy today.     Patient is accompained by:  Family member    Limitations  Standing;Walking;House hold activities    How long can you stand comfortably?  5 minutes, limited by back and knee pain    How long can you walk comfortably?  with Rollator, can walk 400-500 feet before needing to rest due to pain    Patient Stated Goals  to be able to walk better with less pain    Pain Onset  More than a month ago                       [facility] Adult PT Treatment/Exercise - 08/20/17 0001      Bed Mobility   Bed Mobility  Rolling Left;Left Sidelying to Sit;Sit to Sidelying Left    Rolling Left  Minimal Assistance - Patient > 75%    Left Sidelying to Sit  Minimal Assistance - Patient >75%    Sit to Sidelying Left  Minimal Assistance - Patient > 75%      Lumbar Exercises: Supine   Bridge  3 seconds;10 reps    Other Supine Lumbar Exercises  supine decompression exercises 1-5      Lumbar Exercises: Sidelying   Clam  Both;10 reps;2 seconds             PT Education - 08/20/17 1329  Education Details  reviewed initial evaluation goals and objectives.        PT Short Term Goals - 08/18/17 1623      PT SHORT TERM GOAL #1   Title  Patient will be independent with HEP and perform regularly at home.     Time  4    Period  Weeks    Status  New    Target Date  10/13/17      PT SHORT TERM GOAL #2   Title  Patient will exhibit a 1/2 grade improvement in MMT for bilateral lower extremities to indicate improved strength and functional balance ability.     Time  4    Period  Weeks    Status  New      PT SHORT TERM GOAL #3   Title  Patient will complete TUG in <50 seconds to indicate improvement in functional balance and decreased risk for falls.     Time  4    Period  Weeks    Status  New      PT SHORT TERM GOAL #4   Title  Patient will improve 2MWT by 25 feet to indicate improved balance, functional strength and endurance for activity and  decreased risk for falls.         PT Long Term Goals - 08/18/17 1626      PT LONG TERM GOAL #1   Title  Patient will be independent with advanced HEP and perform regularly at home.     Time  8    Period  Weeks    Status  New    Target Date  10/13/17      PT LONG TERM GOAL #2   Title  Patient will exhibit a 1 grade improvement in MMT for bilateral lower extremities to indicate improved strength and functional balance ability.     Time  8    Period  Weeks    Status  New      PT LONG TERM GOAL #3   Title  Patient will complete TUG in <40 seconds to indicate improvement in functional balance and decreased risk for falls.     Time  8    Period  Weeks    Status  New      PT LONG TERM GOAL #4   Title  Patient will improve 2MWT by 50 feet to indicate improved balance, functional strength and endurance for activity and decreased risk for falls.     Time  8    Period  Weeks    Status  New            Plan - 08/20/17 1258    Clinical Impression Statement  Patient tolerated treatment fairly well. Review initial evaluation goals and objectives with patient and niece. Slow ambulation with SPC. Slow transfer, transitions and bed mobility. Initiated HEP and reviewed the niece. 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 Continue with current plan; review HEP; advance exercises and HEP as able.     Rehab Potential  Fair    PT Frequency  2x / week    PT Duration  4 weeks    PT Treatment/Interventions  ADLs/Self Care Home Management;Aquatic Therapy;DME Instruction;Gait training;Stair training;Functional mobility training;Therapeutic activities;Therapeutic exercise;Manual techniques;Patient/family education;Neuromuscular re-education;Balance training;Energy conservation    PT Next Visit Plan  review initial HEP;  mat and seated strengthening exercises progressing into more weight bearing strengthening and balance exercises; bed mobility/transfer  training.  PT Home Exercise Plan  decompression exercises 1-5; log roll; side-lying to/from sit    Consulted and Agree with Plan of Care  Patient;Family member/caregiver    Family Member Consulted  niece       Patient will benefit from skilled therapeutic intervention in order to improve the following deficits and impairments:  Abnormal gait, Decreased endurance, Decreased activity tolerance, Decreased strength, Pain, Decreased balance, Decreased mobility, Difficulty walking, Postural dysfunction  Visit Diagnosis: Difficulty in walking, not elsewhere classified  Muscle weakness (generalized)  Other abnormalities of gait and mobility     Problem List Patient Active Problem List   Diagnosis Date Noted  . Vulvar discomfort 08/07/2017  . Nocturia 07/27/2014  . Recurrent cystitis 07/08/2013  . Atrophic vaginitis 07/08/2013  . Internal hemorrhoids with complication 40/35/2481  . Stiffness of joints, not elsewhere classified, multiple sites 11/19/2012  . Poor balance 11/19/2012  . Hip pain, right 11/20/2011  . Back pain, lumbosacral 11/20/2011  . Dyspepsia 07/19/2011  . Tubulovillous adenoma   . HYPERLIPIDEMIA 08/20/2007  . GLAUCOMA 08/20/2007  . HYPERTENSION 08/20/2007  . Gastroesophageal reflux disease 08/20/2007  . IRRITABLE BOWEL SYNDROME 08/20/2007    Floria Raveling. Hartnett-Rands, MS, PT [facility] #[id number] 08/20/2017, 2:03 PM  [facility] [address] [place], [place], [address] Phone: [phone]   Fax:  [phone]  Name: PARRISH DADDARIO MRN: [id number] Date of Birth: 1917/09/06

## 2017-08-26 ENCOUNTER — Ambulatory Visit (HOSPITAL_COMMUNITY): Payer: Medicare Other

## 2017-08-26 ENCOUNTER — Encounter (HOSPITAL_COMMUNITY): Payer: Self-pay

## 2017-08-26 DIAGNOSIS — R262 Difficulty in walking, not elsewhere classified: Secondary | ICD-10-CM

## 2017-08-26 DIAGNOSIS — R2689 Other abnormalities of gait and mobility: Secondary | ICD-10-CM

## 2017-08-26 DIAGNOSIS — M6281 Muscle weakness (generalized): Secondary | ICD-10-CM

## 2017-08-26 NOTE — Therapy (Signed)
[facility] [address] [place], [place], [address] Phone: [phone]   Fax:  [phone]  Physical Therapy Treatment  Patient Details  Name: Deborah Farrell MRN: [id number] Date of Birth: 1917/11/24 Referring Provider: Dr Allyn Kenner, MD   Encounter Date: 08/26/2017  PT End of Session - 08/26/17 1521    Visit Number  3    Number of Visits  16    Date for PT Re-Evaluation  10/13/17   mini-re-assess 09/15/17   Authorization Type  [employer] Medicare HMO; no deduct; copay $40.00 per visit; co insurance OOP [id number]    Authorization Time Period  01-14-17 thru 01-13-18    Authorization - Visit Number  3    Authorization - Number of Visits  10    PT Start Time  1520    PT Stop Time  1559    PT Time Calculation (min)  39 min    Activity Tolerance  Patient tolerated treatment well    Behavior During Therapy  [facility] for tasks assessed/performed       Past Medical History:  Diagnosis Date  . Allergy history, radiographic dye   . Arthritis   . Dysuria   . Glaucoma   . HTN (hypertension)   . IBS (irritable bowel syndrome)    Non-ulcer dyspepsia; hemorrhoids  . Overactive bladder   . 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 Urinary frequency   . Urinary tract infection     Past Surgical History:  Procedure Laterality Date  . APPENDECTOMY    . CHOLECYSTECTOMY    . COLONOSCOPY  2008, 2011   diverticulitis diagnosed, 42mm sessile polyp removed inascending colon. area tattood for future observation and reccomended f/u colonoscopy in one year  . COLONOSCOPY N/A 04/24/2012   SLF:The colon IS redundant/ Single RECTAL polyp/Moderate diverticulosis/Small internal hemorrhoids  . OVARIAN CYST REMOVAL    . VAGINAL HYSTERECTOMY      There were no vitals filed for this visit.  Subjective Assessment - 08/26/17 1521    Subjective  Pt  stats that her back is still hurting her at 8/10 today. her niece reports that she was so sore from therapy last visit that she couldn't do her HEP due to pain.     Patient is accompained by:  Family member    Limitations  Standing;Walking;House hold activities    How long can you stand comfortably?  5 minutes, limited by back and knee pain    How long can you walk comfortably?  with Rollator, can walk 400-500 feet before needing to rest due to pain    Patient Stated Goals  to be able to walk better with less pain    Currently in Pain?  Yes    Pain Score  8     Pain Location  Back    Pain Orientation  Lower    Pain Descriptors / Indicators  Aching;Dull;Sore    Pain Type  Chronic pain    Pain Onset  More than a month ago    Pain Frequency  Intermittent    Aggravating Factors   standing, walking, unable to perform household activities    Pain Relieving Factors  resting, sitting down    Effect of Pain on Daily Activities  limits activity level             [facility] Adult PT Treatment/Exercise - 08/26/17  0001      Bed Mobility   Bed Mobility  Rolling Right;Right Sidelying to Sit    Rolling Right  Contact Guard/Touching assist    Right Sidelying to Sit  Contact Guard/Touching assist;Minimal Assistance - Patient > 75%      Exercises   Exercises  Lumbar      Lumbar Exercises: Seated   Long Arc Quad on Chair  Both;10 reps    LAQ on Chair Limitations  3" holds    Other Seated Lumbar Exercises  ankle DF/PF x10      Lumbar Exercises: Supine   Clam  10 reps;3 seconds    Clam Limitations  thin orange band, 2 sets, BLE    Bent Knee Raise  10 reps;3 seconds    Bent Knee Raise Limitations  +ab set     Bridge  10 reps;3 seconds    Bridge Limitations  2 sets    Large Ball Oblique Isometric Limitations  SAQ 10x3" holds each    Other Supine Lumbar Exercises  supine decompression exercises 1-5    Other Supine Lumbar Exercises  ball squeezes with kickball 2x10 reps x3-5" holds each               PT Short Term Goals - 08/18/17 1623      PT SHORT TERM GOAL #1   Title  Patient will be independent with HEP and perform regularly at home.     Time  4    Period  Weeks    Status  New    Target Date  10/13/17      PT SHORT TERM GOAL #2   Title  Patient will exhibit a 1/2 grade improvement in MMT for bilateral lower extremities to indicate improved strength and functional balance ability.     Time  4    Period  Weeks    Status  New      PT SHORT TERM GOAL #3   Title  Patient will complete TUG in <50 seconds to indicate improvement in functional balance and decreased risk for falls.     Time  4    Period  Weeks    Status  New      PT SHORT TERM GOAL #4   Title  Patient will improve 2MWT by 25 feet to indicate improved balance, functional strength and endurance for activity and decreased risk for falls.         PT Long Term Goals - 08/18/17 1626      PT LONG TERM GOAL #1   Title  Patient will be independent with advanced HEP and perform regularly at home.     Time  8    Period  Weeks    Status  New    Target Date  10/13/17      PT LONG TERM GOAL #2   Title  Patient will exhibit a 1 grade improvement in MMT for bilateral lower extremities to indicate improved strength and functional balance ability.     Time  8    Period  Weeks    Status  New      PT LONG TERM GOAL #3   Title  Patient will complete TUG in <40 seconds to indicate improvement in functional balance and decreased risk for falls.     Time  8    Period  Weeks    Status  New      PT LONG TERM GOAL #4   Title  Patient will  improve 2MWT by 50 feet to indicate improved balance, functional strength and endurance for activity and decreased risk for falls.     Time  8    Period  Weeks    Status  New            Plan - 08/26/17 1603    Clinical Impression Statement  Pt's niece reporting that pt was very sore for 3-4 days following her last treatment session and stated that she had  significant difficulty with the sidelying clams. Regressed pt's therex this date to all supine mat exercises and seated quad and ankle strengthening. PT tolerating session well, not reporting any pain and reported decreased LBP during therex. Educated pt and her niece that it is normal to have muscle soreness following exercises, however, it should not be as intense as it was. Educated them that she will like still be sore from today, but it shouldn't be as bad since almost all exercises were performed in gravity-eliminated position on the mat. Assess pt's response to regressed therex and progress slowly as able.     Rehab Potential  Fair    PT Frequency  2x / week    PT Duration  4 weeks    PT Treatment/Interventions  ADLs/Self Care Home Management;Aquatic Therapy;DME Instruction;Gait training;Stair training;Functional mobility training;Therapeutic activities;Therapeutic exercise;Manual techniques;Patient/family education;Neuromuscular re-education;Balance training;Energy conservation    PT Next Visit Plan  continue mat and seated strengthening exercises progressing into more weight bearing strengthening and balance exercises; bed mobility/transfer training.    PT Home Exercise Plan  decompression exercises 1-5; log roll; side-lying to/from sit    Consulted and Agree with Plan of Care  Patient;Family member/caregiver    Family Member Consulted  niece       Patient will benefit from skilled therapeutic intervention in order to improve the following deficits and impairments:  Abnormal gait, Decreased endurance, Decreased activity tolerance, Decreased strength, Pain, Decreased balance, Decreased mobility, Difficulty walking, Postural dysfunction  Visit Diagnosis: Difficulty in walking, not elsewhere classified  Muscle weakness (generalized)  Other abnormalities of gait and mobility     Problem List Patient Active Problem List   Diagnosis Date Noted  . Vulvar discomfort 08/07/2017  .  Nocturia 07/27/2014  . Recurrent cystitis 07/08/2013  . Atrophic vaginitis 07/08/2013  . Internal hemorrhoids with complication 67/67/2094  . Stiffness of joints, not elsewhere classified, multiple sites 11/19/2012  . Poor balance 11/19/2012  . Hip pain, right 11/20/2011  . Back pain, lumbosacral 11/20/2011  . Dyspepsia 07/19/2011  . Tubulovillous adenoma   . HYPERLIPIDEMIA 08/20/2007  . GLAUCOMA 08/20/2007  . HYPERTENSION 08/20/2007  . Gastroesophageal reflux disease 08/20/2007  . IRRITABLE BOWEL SYNDROME 08/20/2007      Geraldine Solar PT, DPT  [facility] [address] [place], [place], [address] Phone: [phone]   Fax:  [phone]  Name: Deborah Farrell MRN: [id number] Date of Birth: 11-16-1917

## 2017-08-28 ENCOUNTER — Ambulatory Visit (HOSPITAL_COMMUNITY): Payer: Medicare Other

## 2017-08-28 DIAGNOSIS — R262 Difficulty in walking, not elsewhere classified: Secondary | ICD-10-CM | POA: Diagnosis not present

## 2017-08-28 DIAGNOSIS — R2689 Other abnormalities of gait and mobility: Secondary | ICD-10-CM

## 2017-08-28 DIAGNOSIS — M6281 Muscle weakness (generalized): Secondary | ICD-10-CM

## 2017-08-28 NOTE — Therapy (Signed)
[facility] [address] [place], [place], [address] Phone: [phone]   Fax:  [phone]  Physical Therapy Treatment  Patient Details  Name: Deborah Farrell MRN: [id number] Date of Birth: 30-May-1917 Referring Provider: Dr Allyn Kenner, MD   Encounter Date: 08/28/2017  PT End of Session - 08/28/17 1418    Visit Number  4    Number of Visits  16    Date for PT Re-Evaluation  10/13/17   mini-re-assess 09/15/17   Authorization Type  [employer] Medicare HMO; no deduct; copay $40.00 per visit; co insurance OOP [id number]    Authorization Time Period  01-14-17 thru 01-13-18    Authorization - Visit Number  4    Authorization - Number of Visits  10    PT Start Time  1017    PT Stop Time  1517    PT Time Calculation (min)  46 min    Activity Tolerance  Patient tolerated treatment well    Behavior During Therapy  [facility] for tasks assessed/performed       Past Medical History:  Diagnosis Date  . Allergy history, radiographic dye   . Arthritis   . Dysuria   . Glaucoma   . HTN (hypertension)   . IBS (irritable bowel syndrome)    Non-ulcer dyspepsia; hemorrhoids  . Overactive bladder   . 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 Urinary frequency   . Urinary tract infection     Past Surgical History:  Procedure Laterality Date  . APPENDECTOMY    . CHOLECYSTECTOMY    . COLONOSCOPY  2008, 2011   diverticulitis diagnosed, 54mm sessile polyp removed inascending colon. area tattood for future observation and reccomended f/u colonoscopy in one year  . COLONOSCOPY N/A 04/24/2012   SLF:The colon IS redundant/ Single RECTAL polyp/Moderate diverticulosis/Small internal hemorrhoids  . OVARIAN CYST REMOVAL    . VAGINAL HYSTERECTOMY      There were no vitals filed for this visit.  Subjective Assessment - 08/28/17 1418    Subjective   Niece reports patient has been performing HEP and doing very well with it. Needs help for keeping count. Patient reoprts 7/10 dull achy soreness in rt knee.    Patient is accompained by:  Family member    Limitations  Standing;Walking;House hold activities    How long can you stand comfortably?  5 minutes, limited by back and knee pain    How long can you walk comfortably?  with Rollator, can walk 400-500 feet before needing to rest due to pain    Patient Stated Goals  to be able to walk better with less pain    Currently in Pain?  Yes    Pain Score  7     Pain Location  Knee    Pain Orientation  Right    Pain Descriptors / Indicators  Aching;Dull;Sore    Pain Type  Chronic pain    Pain Onset  More than a month ago                       [facility] Adult PT Treatment/Exercise - 08/28/17 0001      Bed Mobility   Bed Mobility  Rolling Right;Right Sidelying to Sit    Rolling Left  Contact Guard/Touching assist    Left Sidelying to Sit  Contact Guard/Touching assist  Sit to Sidelying Left  Contact Guard/Touching assist      Posture/Postural Control   Posture/Postural Control  Postural limitations    Postural Limitations  Rounded Shoulders;Forward head      Lumbar Exercises: Seated   Long Arc Quad on Chair  Both;10 reps    LAQ on Chair Limitations  3" holds    Other Seated Lumbar Exercises  ankle DF/PF x10      Lumbar Exercises: Supine   Clam  10 reps;3 seconds    Clam Limitations  thin orange band, 2 sets, BLE    Bent Knee Raise  10 reps;3 seconds    Bent Knee Raise Limitations  +ab set     Bridge  10 reps;3 seconds    Bridge Limitations  2 sets    Large Ball Oblique Isometric Limitations  SAQ 10x3" holds each    Other Supine Lumbar Exercises  supine decompression exercises 1-5    Other Supine Lumbar Exercises  ball squeezes with kickball 2x10 reps x3-5" holds each             PT Education - 08/28/17 1524    Education Details  reviewed HEP and askig for  help from family members to keep count for her.     Person(s) Educated  Patient    Methods  Explanation    Comprehension  Verbalized understanding;Need further instruction;Verbal cues required;Tactile cues required       PT Short Term Goals - 08/28/17 1418      PT SHORT TERM GOAL #1   Title  Patient will be independent with HEP and perform regularly at home.     Time  4    Period  Weeks    Status  On-going      PT SHORT TERM GOAL #2   Title  Patient will exhibit a 1/2 grade improvement in MMT for bilateral lower extremities to indicate improved strength and functional balance ability.     Time  4    Period  Weeks    Status  On-going      PT SHORT TERM GOAL #3   Title  Patient will complete TUG in <50 seconds to indicate improvement in functional balance and decreased risk for falls.     Time  4    Period  Weeks    Status  On-going      PT SHORT TERM GOAL #4   Title  Patient will improve 2MWT by 25 feet to indicate improved balance, functional strength and endurance for activity and decreased risk for falls.     Status  On-going        PT Long Term Goals - 08/28/17 1418      PT LONG TERM GOAL #1   Title  Patient will be independent with advanced HEP and perform regularly at home.     Time  8    Period  Weeks    Status  On-going      PT LONG TERM GOAL #2   Title  Patient will exhibit a 1 grade improvement in MMT for bilateral lower extremities to indicate improved strength and functional balance ability.     Time  8    Period  Weeks    Status  On-going      PT LONG TERM GOAL #3   Title  Patient will complete TUG in <40 seconds to indicate improvement in functional balance and decreased risk for falls.     Time  8  Period  Weeks    Status  On-going      PT LONG TERM GOAL #4   Title  Patient will improve 2MWT by 50 feet to indicate improved balance, functional strength and endurance for activity and decreased risk for falls.     Time  8    Period  Weeks     Status  On-going            Plan - 08/28/17 1418    Clinical Impression Statement  Session focused on reviewing HEP and seated or supine core and LE strengthening exercises. Soreness much better today. Patient tolerated treatment well, with pain report decreased in rt knee to 3/10 at EOS. No pain complaints during therex. Continue with current program, progressing as able. Patient will continue to require assistance from family members for HEP follow through so family members need to be educated on new HEP exs.     Rehab Potential  Fair    PT Frequency  2x / week    PT Duration  4 weeks    PT Treatment/Interventions  ADLs/Self Care Home Management;Aquatic Therapy;DME Instruction;Gait training;Stair training;Functional mobility training;Therapeutic activities;Therapeutic exercise;Manual techniques;Patient/family education;Neuromuscular re-education;Balance training;Energy conservation    PT Next Visit Plan  continue mat and seated strengthening exercises progressing into more weight bearing strengthening and balance exercises; bed mobility/transfer training.    PT Home Exercise Plan  decompression exercises 1-5; log roll; side-lying to/from sit    Consulted and Agree with Plan of Care  Patient    Family Member Consulted  --       Patient will benefit from skilled therapeutic intervention in order to improve the following deficits and impairments:  Abnormal gait, Decreased endurance, Decreased activity tolerance, Decreased strength, Pain, Decreased balance, Decreased mobility, Difficulty walking, Postural dysfunction  Visit Diagnosis: Difficulty in walking, not elsewhere classified  Muscle weakness (generalized)  Other abnormalities of gait and mobility     Problem List Patient Active Problem List   Diagnosis Date Noted  . Vulvar discomfort 08/07/2017  . Nocturia 07/27/2014  . Recurrent cystitis 07/08/2013  . Atrophic vaginitis 07/08/2013  . Internal hemorrhoids with  complication 58/83/2549  . Stiffness of joints, not elsewhere classified, multiple sites 11/19/2012  . Poor balance 11/19/2012  . Hip pain, right 11/20/2011  . Back pain, lumbosacral 11/20/2011  . Dyspepsia 07/19/2011  . Tubulovillous adenoma   . HYPERLIPIDEMIA 08/20/2007  . GLAUCOMA 08/20/2007  . HYPERTENSION 08/20/2007  . Gastroesophageal reflux disease 08/20/2007  . IRRITABLE BOWEL SYNDROME 08/20/2007    Floria Raveling. Hartnett-Rands, MS, PT Per [facility] #[id number] 08/28/2017, 3:29 PM  [facility] [address] [place], [place], [address] Phone: [phone]   Fax:  [phone]  Name: Deborah Farrell MRN: [id number] Date of Birth: 1917/01/30

## 2017-09-02 ENCOUNTER — Ambulatory Visit (HOSPITAL_COMMUNITY): Payer: Medicare Other | Admitting: Physical Therapy

## 2017-09-04 ENCOUNTER — Ambulatory Visit (HOSPITAL_COMMUNITY): Payer: Medicare Other | Admitting: Physical Therapy

## 2017-09-04 DIAGNOSIS — M6281 Muscle weakness (generalized): Secondary | ICD-10-CM

## 2017-09-04 DIAGNOSIS — R2689 Other abnormalities of gait and mobility: Secondary | ICD-10-CM

## 2017-09-04 DIAGNOSIS — R262 Difficulty in walking, not elsewhere classified: Secondary | ICD-10-CM | POA: Diagnosis not present

## 2017-09-04 NOTE — Therapy (Signed)
[facility] [address] [place], [place], [address] Phone: [phone]   Fax:  [phone]  Physical Therapy Treatment  Patient Details  Name: Deborah Farrell MRN: [id number] Date of Birth: Oct 19, 1917 Referring Provider: Dr Allyn Kenner, MD   Encounter Date: 09/04/2017  PT End of Session - 09/04/17 1634    Visit Number  5    Number of Visits  16    Date for PT Re-Evaluation  10/13/17   mini-re-assess 09/15/17   Authorization Type  [employer] Medicare HMO; no deduct; copay $40.00 per visit; co insurance OOP [id number]    Authorization Time Period  01-14-17 thru 01-13-18    Authorization - Visit Number  5    Authorization - Number of Visits  10    PT Start Time  9924    PT Stop Time  1518    PT Time Calculation (min)  42 min    Activity Tolerance  Patient tolerated treatment well    Behavior During Therapy  [facility] for tasks assessed/performed       Past Medical History:  Diagnosis Date  . Allergy history, radiographic dye   . Arthritis   . Dysuria   . Glaucoma   . HTN (hypertension)   . IBS (irritable bowel syndrome)    Non-ulcer dyspepsia; hemorrhoids  . Overactive bladder   . 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 Urinary frequency   . Urinary tract infection     Past Surgical History:  Procedure Laterality Date  . APPENDECTOMY    . CHOLECYSTECTOMY    . COLONOSCOPY  2008, 2011   diverticulitis diagnosed, 85mm sessile polyp removed inascending colon. area tattood for future observation and reccomended f/u colonoscopy in one year  . COLONOSCOPY N/A 04/24/2012   SLF:The colon IS redundant/ Single RECTAL polyp/Moderate diverticulosis/Small internal hemorrhoids  . OVARIAN CYST REMOVAL    . VAGINAL HYSTERECTOMY      There were no vitals filed for this visit.  Subjective Assessment - 09/04/17 1449    Subjective  Pt  reports still some achiness in her Rt knee.      Currently in Pain?  Yes    Pain Score  7     Pain Location  Knee    Pain Orientation  Right    Pain Descriptors / Indicators  Aching    Pain Type  Chronic pain                       [facility] Adult PT Treatment/Exercise - 09/04/17 0001      Ambulation/Gait   Gait Comments  gt with SPC 226 feet with cues for larger stride, posture      Lumbar Exercises: Seated   Long Arc Quad on Chair  Both;15 reps    LAQ on Chair Limitations  3" holds      Lumbar Exercises: Supine   Clam  10 reps;3 seconds    Clam Limitations  red band    Bent Knee Raise  10 reps;3 seconds    Bent Knee Raise Limitations  +ab set 2 sets    Bridge  10 reps;3 seconds    Bridge Limitations  2 sets    Other Supine Lumbar Exercises  supine decompression exercises 1-5 5 reps, theraband decompression wtih RTB 5 reps  PT Short Term Goals - 08/28/17 1418      PT SHORT TERM GOAL #1   Title  Patient will be independent with HEP and perform regularly at home.     Time  4    Period  Weeks    Status  On-going      PT SHORT TERM GOAL #2   Title  Patient will exhibit a 1/2 grade improvement in MMT for bilateral lower extremities to indicate improved strength and functional balance ability.     Time  4    Period  Weeks    Status  On-going      PT SHORT TERM GOAL #3   Title  Patient will complete TUG in <50 seconds to indicate improvement in functional balance and decreased risk for falls.     Time  4    Period  Weeks    Status  On-going      PT SHORT TERM GOAL #4   Title  Patient will improve 2MWT by 25 feet to indicate improved balance, functional strength and endurance for activity and decreased risk for falls.     Status  On-going        PT Long Term Goals - 08/28/17 1418      PT LONG TERM GOAL #1   Title  Patient will be independent with advanced HEP and perform regularly at home.     Time  8    Period  Weeks    Status   On-going      PT LONG TERM GOAL #2   Title  Patient will exhibit a 1 grade improvement in MMT for bilateral lower extremities to indicate improved strength and functional balance ability.     Time  8    Period  Weeks    Status  On-going      PT LONG TERM GOAL #3   Title  Patient will complete TUG in <40 seconds to indicate improvement in functional balance and decreased risk for falls.     Time  8    Period  Weeks    Status  On-going      PT LONG TERM GOAL #4   Title  Patient will improve 2MWT by 50 feet to indicate improved balance, functional strength and endurance for activity and decreased risk for falls.     Time  8    Period  Weeks    Status  On-going            Plan - 09/04/17 1635    Clinical Impression Statement  continued with focus on improving gait quality, core and LE stengthening. Improved bed mobility demonstrated today, however still requires cues for logroll technique.  Pt also mobilizes very slowly requiring cues for increased stride.  Pwith difficulty maintaining erect posturing with LAQ due to core weakness as well.      Rehab Potential  Fair    PT Frequency  2x / week    PT Duration  4 weeks    PT Treatment/Interventions  ADLs/Self Care Home Management;Aquatic Therapy;DME Instruction;Gait training;Stair training;Functional mobility training;Therapeutic activities;Therapeutic exercise;Manual techniques;Patient/family education;Neuromuscular re-education;Balance training;Energy conservation    PT Next Visit Plan  continue mat and seated strengthening exercises progressing into more weight bearing strengthening and balance exercises; bed mobility/transfer training.  Next session add decompression theraband exercises and sit to stands.     PT Home Exercise Plan  decompression exercises 1-5; log roll; side-lying to/from sit    Consulted and Agree with Plan  of Care  Patient       Patient will benefit from skilled therapeutic intervention in order to improve the  following deficits and impairments:  Abnormal gait, Decreased endurance, Decreased activity tolerance, Decreased strength, Pain, Decreased balance, Decreased mobility, Difficulty walking, Postural dysfunction  Visit Diagnosis: Muscle weakness (generalized)  Other abnormalities of gait and mobility     Problem List Patient Active Problem List   Diagnosis Date Noted  . Vulvar discomfort 08/07/2017  . Nocturia 07/27/2014  . Recurrent cystitis 07/08/2013  . Atrophic vaginitis 07/08/2013  . Internal hemorrhoids with complication 87/57/9728  . Stiffness of joints, not elsewhere classified, multiple sites 11/19/2012  . Poor balance 11/19/2012  . Hip pain, right 11/20/2011  . Back pain, lumbosacral 11/20/2011  . Dyspepsia 07/19/2011  . Tubulovillous adenoma   . HYPERLIPIDEMIA 08/20/2007  . GLAUCOMA 08/20/2007  . HYPERTENSION 08/20/2007  . Gastroesophageal reflux disease 08/20/2007  . IRRITABLE BOWEL SYNDROME 08/20/2007   Teena Irani, PTA/CLT [phone]  Teena Irani 09/04/2017, 4:48 PM  [facility] [place], [place], [address] Phone: [phone]   Fax:  [phone]  Name: Deborah Farrell MRN: [id number] Date of Birth: 06/10/1917

## 2017-09-09 ENCOUNTER — Other Ambulatory Visit: Payer: Self-pay

## 2017-09-09 ENCOUNTER — Ambulatory Visit (HOSPITAL_COMMUNITY): Payer: Medicare Other

## 2017-09-09 ENCOUNTER — Encounter (HOSPITAL_COMMUNITY): Payer: Self-pay

## 2017-09-09 DIAGNOSIS — R262 Difficulty in walking, not elsewhere classified: Secondary | ICD-10-CM | POA: Diagnosis not present

## 2017-09-09 DIAGNOSIS — M6281 Muscle weakness (generalized): Secondary | ICD-10-CM

## 2017-09-09 DIAGNOSIS — R2689 Other abnormalities of gait and mobility: Secondary | ICD-10-CM

## 2017-09-09 NOTE — Therapy (Signed)
**Note Deborah-Identified via Obfuscation** [facility] [address] [place], [place], [address] Phone: [phone]   Fax:  [phone]  Physical Therapy Treatment  Patient Details  Name: Deborah Farrell MRN: [id number] Date of Birth: [birth year] Referring Provider: Dr Allyn Kenner, MD   Encounter Date: 09/09/2017  PT End of Session - 09/09/17 1514    Visit Number  6    Number of Visits  16    Date for PT Re-Evaluation  10/13/17   mini-re-assess 09/15/17   Authorization Type  [employer] Medicare HMO; no deduct; copay $40.00 per visit; co insurance OOP [id number]    Authorization Time Period  01-14-17 thru 01-13-18    Authorization - Visit Number  6    Authorization - Number of Visits  10    PT Start Time  1435    PT Stop Time  1519    PT Time Calculation (min)  44 min    Activity Tolerance  Patient tolerated treatment well    Behavior During Therapy  [facility] for tasks assessed/performed       Past Medical History:  Diagnosis Date  . Allergy history, radiographic dye   . Arthritis   . Dysuria   . Glaucoma   . HTN (hypertension)   . IBS (irritable bowel syndrome)    Non-ulcer dyspepsia; hemorrhoids  . Overactive bladder   . Tubulovillous adenoma 2007   with multifocal high grade dysplasia-WFBH 2007;TV ADENOMA w/o dysplasia 2008;  SIMPLE ADENOMA 2009; 22mm SIMPLE ADENOMA JAN 2011  . Urinary frequency   . Urinary tract infection     Past Surgical History:  Procedure Laterality Date  . APPENDECTOMY    . CHOLECYSTECTOMY    . COLONOSCOPY  2008, 2011   diverticulitis diagnosed, 82mm sessile polyp removed inascending colon. area tattood for future observation and reccomended f/u colonoscopy in one year  . COLONOSCOPY N/A 04/24/2012   SLF:The colon IS redundant/ Single RECTAL polyp/Moderate diverticulosis/Small internal hemorrhoids  . OVARIAN CYST REMOVAL    . VAGINAL HYSTERECTOMY      There were no vitals filed for this visit.  Subjective Assessment - 09/09/17 1449    Subjective   Patient reports she feels stiff today and her arthritis is making her ache today. She states this weather is difficult and makes her jionts hurt more.    Patient is accompained by:  Family member    Limitations  Standing;Walking;House hold activities    How long can you stand comfortably?  5 minutes, limited by back and knee pain    How long can you walk comfortably?  with Rollator, can walk 400-500 feet before needing to rest due to pain    Patient Stated Goals  to be able to walk better with less pain    Currently in Pain?  Yes    Pain Score  5     Pain Location  Knee    Pain Orientation  Left;Right    Pain Descriptors / Indicators  Aching    Pain Type  Chronic pain    Pain Onset  More than a month ago    Pain Frequency  Intermittent    Aggravating Factors   standing, walking, cold rainy weather    Pain Relieving Factors  rest, sitting    Effect of Pain on Daily Activities  limited endurance       [facility] Adult PT Treatment/Exercise - 09/09/17 0001      Transfers   Comments  Log roll performed 2x sit<>supine. patient required  minimal cues on teh first transfer and no cues needed on second attempt.      Ambulation/Gait   Ambulation Distance (Feet)  126 Feet    Assistive device  Straight cane    Gait Comments  gait training for sequencing cane with Lt LE and cues for upright posture and increased hip/knee flexion to prevent toe catching      Lumbar Exercises: Seated   Other Seated Lumbar Exercises  Money: bil shoulder er, 2x 10 reps with 3 second holds      Lumbar Exercises: Supine   Clam  10 reps;3 seconds    Clam Limitations  2 sets; red band    Bent Knee Raise  10 reps;3 seconds    Bent Knee Raise Limitations  +ab set 2 sets   patient with difficulty performingTrA activation   Bridge  10 reps;3 seconds    Bridge Limitations  2 sets    Other Supine Lumbar Exercises  SAQ, 2x 10 reps bil LE        PT Education - 09/09/17 1529    Education Details  Educated on how to assess  cane height to make correct fit and discussed that her current cane is too tall for her and cannot be lowered any further. Updated HEP with posture exercise.    Person(s) Educated  Patient;Other (comment)   patient's neice   Methods  Explanation;Handout    Comprehension  Verbalized understanding;Returned demonstration       PT Short Term Goals - 08/28/17 1418      PT SHORT TERM GOAL #1   Title  Patient will be independent with HEP and perform regularly at home.     Time  4    Period  Weeks    Status  On-going      PT SHORT TERM GOAL #2   Title  Patient will exhibit a 1/2 grade improvement in MMT for bilateral lower extremities to indicate improved strength and functional balance ability.     Time  4    Period  Weeks    Status  On-going      PT SHORT TERM GOAL #3   Title  Patient will complete TUG in <50 seconds to indicate improvement in functional balance and decreased risk for falls.     Time  4    Period  Weeks    Status  On-going      PT SHORT TERM GOAL #4   Title  Patient will improve 2MWT by 25 feet to indicate improved balance, functional strength and endurance for activity and decreased risk for falls.     Status  On-going        PT Long Term Goals - 08/28/17 1418      PT LONG TERM GOAL #1   Title  Patient will be independent with advanced HEP and perform regularly at home.     Time  8    Period  Weeks    Status  On-going      PT LONG TERM GOAL #2   Title  Patient will exhibit a 1 grade improvement in MMT for bilateral lower extremities to indicate improved strength and functional balance ability.     Time  8    Period  Weeks    Status  On-going      PT LONG TERM GOAL #3   Title  Patient will complete TUG in <40 seconds to indicate improvement in functional balance and decreased risk for falls.  Time  8    Period  Weeks    Status  On-going      PT LONG TERM GOAL #4   Title  Patient will improve 2MWT by 50 feet to indicate improved balance, functional  strength and endurance for activity and decreased risk for falls.     Time  8    Period  Weeks    Status  On-going        Plan - 09/09/17 1531    Clinical Impression Statement  Session continued with current POC for LE strengthening and gait training with SPC. Patient requires cues to increase hip/knee flexion to achieve heel to toe step pattern. Her cane is too high for her currently and is at the lowest height setting available; I educated the patient and her niece about this and encouraged them to try other cane available at their home. Continued with log roll training today and patient had difficulty at start of session to sequence this; at EOS she was able to perform 2 times with minimal assistance. She reported feeling more confused today. Postural exercise with bil shoulder external rotation was added today and patient had excellent form, therefore exercise was added to HEP. She will continue to benefit from skilled PT interventions to maintain current functional level and mobility.    Rehab Potential  Fair    PT Frequency  2x / week    PT Duration  4 weeks    PT Treatment/Interventions  ADLs/Self Care Home Management;Aquatic Therapy;DME Instruction;Gait training;Stair training;Functional mobility training;Therapeutic activities;Therapeutic exercise;Manual techniques;Patient/family education;Neuromuscular re-education;Balance training;Energy conservation    PT Next Visit Plan  Continue supine and seated strengthening exercises progressing into more weight bearing strengthening and balance exercises; bed mobility/transfer training.  Perform sit to stands next session and side stepping in // bars. Add standing hip abd/ext.    PT Home Exercise Plan  decompression exercises 1-5; log roll; side-lying to/from sit; money in sitting    Consulted and Agree with Plan of Care  Patient    Family Member Consulted  niece       Patient will benefit from skilled therapeutic intervention in order to  improve the following deficits and impairments:  Abnormal gait, Decreased endurance, Decreased activity tolerance, Decreased strength, Pain, Decreased balance, Decreased mobility, Difficulty walking, Postural dysfunction  Visit Diagnosis: Muscle weakness (generalized)  Other abnormalities of gait and mobility  Difficulty in walking, not elsewhere classified     Problem List Patient Active Problem List   Diagnosis Date Noted  . Vulvar discomfort 08/07/2017  . Nocturia 07/27/2014  . Recurrent cystitis 07/08/2013  . Atrophic vaginitis 07/08/2013  . Internal hemorrhoids with complication 68/03/2120  . Stiffness of joints, not elsewhere classified, multiple sites 11/19/2012  . Poor balance 11/19/2012  . Hip pain, right 11/20/2011  . Back pain, lumbosacral 11/20/2011  . Dyspepsia 07/19/2011  . Tubulovillous adenoma   . HYPERLIPIDEMIA 08/20/2007  . GLAUCOMA 08/20/2007  . HYPERTENSION 08/20/2007  . Gastroesophageal reflux disease 08/20/2007  . IRRITABLE BOWEL SYNDROME 08/20/2007    Kipp Brood, PT, DPT Physical Therapist with [facility]  09/09/2017 3:40 PM    [facility] [address] [place], [place], [address] Phone: [phone]   Fax:  [phone]  Name: Deborah Farrell MRN: [id number] Date of Birth: [birth year]

## 2017-09-11 ENCOUNTER — Ambulatory Visit (HOSPITAL_COMMUNITY): Payer: Medicare Other | Admitting: Physical Therapy

## 2017-09-11 DIAGNOSIS — R2689 Other abnormalities of gait and mobility: Secondary | ICD-10-CM

## 2017-09-11 DIAGNOSIS — R262 Difficulty in walking, not elsewhere classified: Secondary | ICD-10-CM | POA: Diagnosis not present

## 2017-09-11 DIAGNOSIS — M6281 Muscle weakness (generalized): Secondary | ICD-10-CM

## 2017-09-11 NOTE — Therapy (Signed)
[facility] [address] [place], [place], [address] Phone: [phone]   Fax:  [phone]  Physical Therapy Treatment  Patient Details  Name: JALINE PINCOCK MRN: [id number] Date of Birth: 1917/03/06 Referring Provider: Dr Allyn Kenner, MD   Encounter Date: 09/11/2017  PT End of Session - 09/11/17 1444    Visit Number  7    Number of Visits  16    Date for PT Re-Evaluation  10/13/17   mini-re-assess 09/15/17   Authorization Type  [employer] Medicare HMO; no deduct; copay $40.00 per visit; co insurance OOP [id number]    Authorization Time Period  01-14-17 thru 01-13-18    Authorization - Visit Number  7    Authorization - Number of Visits  10    PT Start Time  1423    PT Stop Time  1505    PT Time Calculation (min)  42 min    Activity Tolerance  Patient tolerated treatment well    Behavior During Therapy  [facility] for tasks assessed/performed       Past Medical History:  Diagnosis Date  . Allergy history, radiographic dye   . Arthritis   . Dysuria   . Glaucoma   . HTN (hypertension)   . IBS (irritable bowel syndrome)    Non-ulcer dyspepsia; hemorrhoids  . Overactive bladder   . Tubulovillous adenoma 2007   with multifocal high grade dysplasia-WFBH 2007;TV ADENOMA w/o dysplasia 2008;  SIMPLE ADENOMA 2009; 9mm SIMPLE ADENOMA JAN 2011  . Urinary frequency   . Urinary tract infection     Past Surgical History:  Procedure Laterality Date  . APPENDECTOMY    . CHOLECYSTECTOMY    . COLONOSCOPY  2008, 2011   diverticulitis diagnosed, 30mm sessile polyp removed inascending colon. area tattood for future observation and reccomended f/u colonoscopy in one year  . COLONOSCOPY N/A 04/24/2012   SLF:The colon IS redundant/ Single RECTAL polyp/Moderate diverticulosis/Small internal hemorrhoids  . OVARIAN CYST REMOVAL    . VAGINAL HYSTERECTOMY      There were no vitals filed for this visit.  Subjective Assessment - 09/11/17 1433    Subjective  pt  states her back is hurting today at 7/10.      Currently in Pain?  Yes    Pain Score  7     Pain Location  Back    Pain Orientation  Mid;Lower    Pain Descriptors / Indicators  Aching                       [facility] Adult PT Treatment/Exercise - 09/11/17 0001      Lumbar Exercises: Standing   Heel Raises  10 reps    Other Standing Lumbar Exercises  hip abduction, extension 10 reps each    Other Standing Lumbar Exercises  side stepping in parallel bars 2RT, UE's hovering over rail and cues for larger steps      Lumbar Exercises: Seated   Sit to Stand  10 reps;Limitations    Sit to Stand Limitations  no UEs from low mat    Other Seated Lumbar Exercises  Money: bil shoulder er, 2x 10 reps with 3 second holds    Other Seated Lumbar Exercises  w-backs 10 reps      Lumbar Exercises: Supine   Clam  15 reps;3 seconds    Clam Limitations  2 sets; red band    Bent Knee Raise  15 reps    Bent Knee  Raise Limitations  +ab set 2 sets    Bridge  15 reps;3 seconds    Bridge Limitations  2 sets               PT Short Term Goals - 08/28/17 1418      PT SHORT TERM GOAL #1   Title  Patient will be independent with HEP and perform regularly at home.     Time  4    Period  Weeks    Status  On-going      PT SHORT TERM GOAL #2   Title  Patient will exhibit a 1/2 grade improvement in MMT for bilateral lower extremities to indicate improved strength and functional balance ability.     Time  4    Period  Weeks    Status  On-going      PT SHORT TERM GOAL #3   Title  Patient will complete TUG in <50 seconds to indicate improvement in functional balance and decreased risk for falls.     Time  4    Period  Weeks    Status  On-going      PT SHORT TERM GOAL #4   Title  Patient will improve 2MWT by 25 feet to indicate improved balance, functional strength and endurance for activity and decreased risk for falls.     Status  On-going        PT Long Term Goals - 08/28/17 1418       PT LONG TERM GOAL #1   Title  Patient will be independent with advanced HEP and perform regularly at home.     Time  8    Period  Weeks    Status  On-going      PT LONG TERM GOAL #2   Title  Patient will exhibit a 1 grade improvement in MMT for bilateral lower extremities to indicate improved strength and functional balance ability.     Time  8    Period  Weeks    Status  On-going      PT LONG TERM GOAL #3   Title  Patient will complete TUG in <40 seconds to indicate improvement in functional balance and decreased risk for falls.     Time  8    Period  Weeks    Status  On-going      PT LONG TERM GOAL #4   Title  Patient will improve 2MWT by 50 feet to indicate improved balance, functional strength and endurance for activity and decreased risk for falls.     Time  8    Period  Weeks    Status  On-going            Plan - 09/11/17 1445    Clinical Impression Statement  continued with LE strengthening and improving safety/cadence with SPC.  Added side stepping in parallel bars with cues for taking larger steps and completing with less UE assistance.  Also began hip abduction and extension in standing with tactile cues for posturing and isolation of correct mm.  Pt continues to mobilize slowly, however improved from last week needing less cues.  Able to increase mat activites to 2 sets of 15 reps today as well.     PT Next Visit Plan  continue to work towards goals. Next session begin tandem stance.  Update HEP to include seated and standing exericses (pt informed therapist as walking out at end of session she is unable to complete supine exercises as  her bed is too soft).        Patient will benefit from skilled therapeutic intervention in order to improve the following deficits and impairments:     Visit Diagnosis: Muscle weakness (generalized)  Other abnormalities of gait and mobility  Difficulty in walking, not elsewhere classified     Problem List Patient  Active Problem List   Diagnosis Date Noted  . Vulvar discomfort 08/07/2017  . Nocturia 07/27/2014  . Recurrent cystitis 07/08/2013  . Atrophic vaginitis 07/08/2013  . Internal hemorrhoids with complication 51/76/1607  . Stiffness of joints, not elsewhere classified, multiple sites 11/19/2012  . Poor balance 11/19/2012  . Hip pain, right 11/20/2011  . Back pain, lumbosacral 11/20/2011  . Dyspepsia 07/19/2011  . Tubulovillous adenoma   . HYPERLIPIDEMIA 08/20/2007  . GLAUCOMA 08/20/2007  . HYPERTENSION 08/20/2007  . Gastroesophageal reflux disease 08/20/2007  . IRRITABLE BOWEL SYNDROME 08/20/2007   Teena Irani, PTA/CLT [phone]  Teena Irani 09/11/2017, 3:11 PM  [facility] [address] [place], [place], [address] Phone: [phone]   Fax:  [phone]  Name: Deborah Farrell MRN: [id number] Date of Birth: July 03, 1917

## 2017-09-16 ENCOUNTER — Other Ambulatory Visit: Payer: Self-pay

## 2017-09-16 ENCOUNTER — Encounter (HOSPITAL_COMMUNITY): Payer: Self-pay

## 2017-09-16 ENCOUNTER — Ambulatory Visit (HOSPITAL_COMMUNITY): Payer: Medicare Other | Attending: Internal Medicine

## 2017-09-16 DIAGNOSIS — R2689 Other abnormalities of gait and mobility: Secondary | ICD-10-CM | POA: Diagnosis not present

## 2017-09-16 DIAGNOSIS — R262 Difficulty in walking, not elsewhere classified: Secondary | ICD-10-CM | POA: Insufficient documentation

## 2017-09-16 DIAGNOSIS — M6281 Muscle weakness (generalized): Secondary | ICD-10-CM | POA: Insufficient documentation

## 2017-09-16 NOTE — Therapy (Signed)
[facility] [address] [place], [place], [address] Phone: [phone]   Fax:  [phone]  Physical Therapy Treatment/Progress Note  Patient Details  Name: Deborah Farrell MRN: [id number] Date of Birth: 06-30-1917 Referring Provider: Dr Allyn Kenner, MD   Encounter Date: 09/16/2017   Progress Note Reporting Period 08/18/17 to 09/16/17  See note below for Objective Data and Assessment of Progress/Goals.     PT End of Session - 09/16/17 1629    Visit Number  8    Number of Visits  16    Date for PT Re-Evaluation  10/13/17   mini-re-assess 09/15/17   Authorization Type  BCBS Medicare HMO; no deduct; copay $40.00 per visit; co insurance OOP $[id number]    Authorization Time Period  Cert = 06/15/42 - 03/29/38 (01-14-17 thru 01-13-18)    Authorization - Visit Number  1    Authorization - Number of Visits  10    PT Start Time  1435    PT Stop Time  1518    PT Time Calculation (min)  43 min    Activity Tolerance  Patient tolerated treatment well    Behavior During Therapy  WFL for tasks assessed/performed       Past Medical History:  Diagnosis Date  . Allergy history, radiographic dye   . Arthritis   . Dysuria   . Glaucoma   . HTN (hypertension)   . IBS (irritable bowel syndrome)    Non-ulcer dyspepsia; hemorrhoids  . Overactive bladder   . 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 Urinary frequency   . Urinary tract infection     Past Surgical History:  Procedure Laterality Date  . APPENDECTOMY    . CHOLECYSTECTOMY    . COLONOSCOPY  2008, 2011   diverticulitis diagnosed, 185msessile polyp removed inascending colon. area tattood for future observation and reccomended f/u colonoscopy in one year  . COLONOSCOPY N/A 04/24/2012   SLF:The colon IS redundant/ Single RECTAL polyp/Moderate diverticulosis/Small internal  hemorrhoids  . OVARIAN CYST REMOVAL    . VAGINAL HYSTERECTOMY      There were no vitals filed for this visit.  Subjective Assessment - 09/16/17 1627    Subjective  Patient reports her Rt hip is hurting today.    Patient is accompained by:  Family member   niece   Limitations  Standing;Walking;House hold activities    How long can you stand comfortably?  5 minutes, limited by back and knee pain    How long can you walk comfortably?  with Rollator, can walk 400-500 feet before needing to rest due to pain    Patient Stated Goals  to be able to walk better with less pain    Currently in Pain?  Yes    Pain Score  8     Pain Location  Hip    Pain Orientation  Right    Pain Descriptors / Indicators  Aching;Sore    Pain Type  Acute pain    Pain Onset  In the past 7 days    Pain Frequency  Intermittent    Aggravating Factors   walking, exercises    Pain Relieving Factors  rest    Effect of Pain on Daily Activities  decreased strength/endurance         [facility] PT Assessment - 09/16/17 0001      Assessment  Medical Diagnosis  decreased mobility and weakness    Referring Provider  Dr Allyn Kenner, MD    Onset Date/Surgical Date  07/30/17      Precautions   Precautions  None      Restrictions   Weight Bearing Restrictions  No      Cognition   Overall Cognitive Status  History of cognitive impairments - at baseline      Strength   Right Hip Flexion  4/5   4-   Right Hip ABduction  3+/5   3+   Left Hip Flexion  4/5   4-   Left Hip ABduction  3+/5   3+   Right Knee Flexion  4/5   4-   Right Knee Extension  4+/5   4   Left Knee Flexion  4/5   4-   Left Knee Extension  4+/5   4   Right Ankle Dorsiflexion  4/5   4   Left Ankle Dorsiflexion  4/5   4     Ambulation/Gait   Ambulation/Gait  Yes    Ambulation/Gait Assistance  5: Supervision    Ambulation Distance (Feet)  138 Feet   2MWT   Gait Pattern  Decreased arm swing - right;Decreased arm swing - left;Decreased  hip/knee flexion - right;Decreased stride length;Decreased hip/knee flexion - left;Poor foot clearance - right;Narrow base of support    Ambulation Surface  Level;Indoor    Gait velocity  0.35 m/s   was 0.27 m/s on 08/18/17     Standardized Balance Assessment   Standardized Balance Assessment  Timed Up and Go Test      Timed Up and Go Test   TUG  Normal TUG    Normal TUG (seconds)  42.4   59.52 seconds on 08/18/17   TUG Comments  with SPC        [facility] Adult PT Treatment/Exercise - 09/16/17 0001      Ambulation/Gait   Assistive device  Straight cane      Exercises   Exercises  Knee/Hip      Lumbar Exercises: Seated   Long Arc Quad on Chair  Both;2 sets;10 reps    Sit to Stand  10 reps    Other Seated Lumbar Exercises  Money: bil shoulder er, 2x 10 reps with 3 second holds      Knee/Hip Exercises: Seated   Marching  Both;2 sets;10 reps        PT Education - 09/16/17 1628    Education Details  Edcuated on progress towards goals and on updted seated HEP for strengthening at home. Reviewed with patient's niece.    Person(s) Educated  Patient;Other (comment)   patient's niece   Methods  Explanation;Handout    Comprehension  Verbalized understanding;Returned demonstration       PT Short Term Goals - 09/16/17 1630      PT SHORT TERM GOAL #1   Title  Patient will be independent with HEP and perform regularly at home.     Baseline  at least every other day    Time  4    Period  Weeks    Status  Partially Met      PT SHORT TERM GOAL #2   Title  Patient will exhibit a 1/2 grade improvement in MMT for bilateral lower extremities to indicate improved strength and functional balance ability.     Time  4    Period  Weeks    Status  Partially Met  PT SHORT TERM GOAL #3   Title  Patient will complete TUG in <50 seconds to indicate improvement in functional balance and decreased risk for falls.     Time  4    Period  Weeks    Status  Achieved      PT SHORT TERM GOAL #4    Title  Patient will improve 2MWT by 25 feet to indicate improved balance, functional strength and endurance for activity and decreased risk for falls.     Status  Achieved        PT Long Term Goals - 09/16/17 1630      PT LONG TERM GOAL #1   Title  Patient will be independent with advanced HEP and perform regularly at home.     Time  8    Period  Weeks    Status  On-going      PT LONG TERM GOAL #2   Title  Patient will exhibit a 1 grade improvement in MMT for bilateral lower extremities to indicate improved strength and functional balance ability.     Time  8    Period  Weeks    Status  On-going      PT LONG TERM GOAL #3   Title  Patient will complete TUG in <40 seconds to indicate improvement in functional balance and decreased risk for falls.     Time  8    Period  Weeks    Status  On-going      PT LONG TERM GOAL #4   Title  Patient will improve 2MWT by 50 feet to indicate improved balance, functional strength and endurance for activity and decreased risk for falls.     Time  8    Period  Weeks    Status  On-going        Plan - 09/16/17 1629    Clinical Impression Statement  Re-assessment performed this session and patient has made good progress towards short and long-term goals. She has improved her gait velocity significantly however still demonstrates decreased stride length, hip flexion, and foot clearance. She improved her TUG time significantly however remains in the fall risk category. Bil LE strength improved by  grade for some muscle groups tested. Patient did experience discomfort in her Rt shoulder while rolling from prone to Rt side-lying to perform MMT. She was instructed on updated HEP in sitting to progress strengthening at home as she expressed difficulty performing exercises in her bed.    Rehab Potential  Fair    PT Frequency  2x / week    PT Duration  4 weeks    PT Treatment/Interventions  ADLs/Self Care Home Management;Aquatic Therapy;DME  Instruction;Gait training;Stair training;Functional mobility training;Therapeutic activities;Therapeutic exercise;Manual techniques;Patient/family education;Neuromuscular re-education;Balance training;Energy conservation    PT Next Visit Plan  Add standing exercise to HEP. Next session begin tandem stance.  Follow up on Rt hip and shoulder pain.     PT Home Exercise Plan  decompression exercises 1-5; log roll; side-lying to/from sit; money in sitting; seated marching, sit to stand, LAQ    Consulted and Agree with Plan of Care  Patient;Family member/caregiver    Family Member Consulted  niece       Patient will benefit from skilled therapeutic intervention in order to improve the following deficits and impairments:  Abnormal gait, Decreased endurance, Decreased activity tolerance, Decreased strength, Pain, Decreased balance, Decreased mobility, Difficulty walking, Postural dysfunction  Visit Diagnosis: Muscle weakness (generalized)  Other abnormalities of gait and  mobility  Difficulty in walking, not elsewhere classified     Problem List Patient Active Problem List   Diagnosis Date Noted  . Vulvar discomfort 08/07/2017  . Nocturia 07/27/2014  . Recurrent cystitis 07/08/2013  . Atrophic vaginitis 07/08/2013  . Internal hemorrhoids with complication 12/81/1886  . Stiffness of joints, not elsewhere classified, multiple sites 11/19/2012  . Poor balance 11/19/2012  . Hip pain, right 11/20/2011  . Back pain, lumbosacral 11/20/2011  . Dyspepsia 07/19/2011  . Tubulovillous adenoma   . HYPERLIPIDEMIA 08/20/2007  . GLAUCOMA 08/20/2007  . HYPERTENSION 08/20/2007  . Gastroesophageal reflux disease 08/20/2007  . IRRITABLE BOWEL SYNDROME 08/20/2007    Kipp Brood, PT, DPT Physical Therapist with [facility]  09/16/2017 4:50 PM    [facility] [address] [place], [place], [address] Phone: [phone]   Fax:   [phone]  Name: EVALIE HARGRAVES MRN: [id number] Date of Birth: 1917-04-16

## 2017-09-18 ENCOUNTER — Other Ambulatory Visit: Payer: Self-pay

## 2017-09-18 ENCOUNTER — Encounter (HOSPITAL_COMMUNITY): Payer: Self-pay

## 2017-09-18 ENCOUNTER — Ambulatory Visit (HOSPITAL_COMMUNITY): Payer: Medicare Other

## 2017-09-18 DIAGNOSIS — M6281 Muscle weakness (generalized): Secondary | ICD-10-CM | POA: Diagnosis not present

## 2017-09-18 DIAGNOSIS — R262 Difficulty in walking, not elsewhere classified: Secondary | ICD-10-CM

## 2017-09-18 DIAGNOSIS — R2689 Other abnormalities of gait and mobility: Secondary | ICD-10-CM

## 2017-09-18 NOTE — Therapy (Signed)
[facility] [address] [place], [place], [address] Phone: [phone]   Fax:  [phone]  Physical Therapy Treatment  Patient Details  Name: TUYEN UNCAPHER MRN: [id number] Date of Birth: 06-22-1917 Referring Provider: Dr Allyn Kenner, MD   Encounter Date: 09/18/2017  PT End of Session - 09/18/17 1657    Visit Number  9    Number of Visits  16    Date for PT Re-Evaluation  10/13/17   mini-re-assess 09/15/17   Authorization Type  BCBS Medicare HMO; no deduct; copay $40.00 per visit; co insurance [place] $[id number]    Authorization Time Period  Cert = 03/16/10 - 2/48/25 (01-14-17 thru 01-13-18)    Authorization - Visit Number  2    Authorization - Number of Visits  10    PT Start Time  1435    PT Stop Time  1518    PT Time Calculation (min)  43 min    Activity Tolerance  Patient tolerated treatment well    Behavior During Therapy  [facility] for tasks assessed/performed       Past Medical History:  Diagnosis Date  . Allergy history, radiographic dye   . Arthritis   . Dysuria   . Glaucoma   . HTN (hypertension)   . IBS (irritable bowel syndrome)    Non-ulcer dyspepsia; hemorrhoids  . Overactive bladder   . 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 Urinary frequency   . Urinary tract infection     Past Surgical History:  Procedure Laterality Date  . APPENDECTOMY    . CHOLECYSTECTOMY    . COLONOSCOPY  2008, 2011   diverticulitis diagnosed, 162msessile polyp removed inascending colon. area tattood for future observation and reccomended f/u colonoscopy in one year  . COLONOSCOPY N/A 04/24/2012   SLF:The colon IS redundant/ Single RECTAL polyp/Moderate diverticulosis/Small internal hemorrhoids  . OVARIAN CYST REMOVAL    . VAGINAL HYSTERECTOMY      There were no vitals filed for this visit.  Subjective Assessment - 09/18/17  1455    Subjective  Patient reports her Rt hip is still bothering her.     Patient is accompained by:  Family member   niece   Limitations  Standing;Walking;House hold activities    How long can you stand comfortably?  5 minutes, limited by back and knee pain    How long can you walk comfortably?  with Rollator, can walk 400-500 feet before needing to rest due to pain    Patient Stated Goals  to be able to walk better with less pain    Currently in Pain?  Yes    Pain Score  8     Pain Location  Hip    Pain Orientation  Right    Pain Descriptors / Indicators  Aching;Sore    Pain Type  Acute pain    Pain Onset  In the past 7 days    Pain Frequency  Intermittent    Aggravating Factors   walking    Pain Relieving Factors  rest    Effect of Pain on Daily Activities  decresaed strength       [facility] Adult PT Treatment/Exercise - 09/18/17 0001      Lumbar Exercises: Standing   Heel Raises  10 reps   2 sets   Other Standing Lumbar Exercises  hip abduction, extension 1x 10  reps bil LE      Lumbar Exercises: Seated   Sit to Stand  10 reps   2 sets     Balance Exercises - 09/18/17 1706      Balance Exercises: Standing   Tandem Stance  Eyes open;4 reps;20 secs   alt foot alignment   Step Over Hurdles / Cones  4x forward step over 4x 6" hurdles; 2x RT for lateral step over 4x 6" hurdles, UE assist in // bars    Marching Limitations  1x 10 reps bil LE with UE assist (tactile/visual cue for increased hip flexion); 2x 10 reps bil LE with min assist for weight shift to for SLS training        PT Education - 09/18/17 1657    Education Details  Edcuated on exxercises in standing, and updated HEP.    Person(s) Educated  Patient;Other (comment)   niece   Methods  Explanation;Handout;Demonstration    Comprehension  Verbalized understanding;Returned demonstration       PT Short Term Goals - 09/16/17 1630      PT SHORT TERM GOAL #1   Title  Patient will be independent with HEP and  perform regularly at home.     Baseline  at least every other day    Time  4    Period  Weeks    Status  Partially Met      PT SHORT TERM GOAL #2   Title  Patient will exhibit a 1/2 grade improvement in MMT for bilateral lower extremities to indicate improved strength and functional balance ability.     Time  4    Period  Weeks    Status  Partially Met      PT SHORT TERM GOAL #3   Title  Patient will complete TUG in <50 seconds to indicate improvement in functional balance and decreased risk for falls.     Time  4    Period  Weeks    Status  Achieved      PT SHORT TERM GOAL #4   Title  Patient will improve 2MWT by 25 feet to indicate improved balance, functional strength and endurance for activity and decreased risk for falls.     Status  Achieved        PT Long Term Goals - 09/16/17 1630      PT LONG TERM GOAL #1   Title  Patient will be independent with advanced HEP and perform regularly at home.     Time  8    Period  Weeks    Status  On-going      PT LONG TERM GOAL #2   Title  Patient will exhibit a 1 grade improvement in MMT for bilateral lower extremities to indicate improved strength and functional balance ability.     Time  8    Period  Weeks    Status  On-going      PT LONG TERM GOAL #3   Title  Patient will complete TUG in <40 seconds to indicate improvement in functional balance and decreased risk for falls.     Time  8    Period  Weeks    Status  On-going      PT LONG TERM GOAL #4   Title  Patient will improve 2MWT by 50 feet to indicate improved balance, functional strength and endurance for activity and decreased risk for falls.     Time  8    Period  Weeks  Status  On-going        Plan - 09/18/17 1515    Clinical Impression Statement  Therapy continued with focus on LE strengthening and balance training and patient progressed to standing exercises this session. She demonstrated good activity tolerance and required intermittent rest breaks after  2-3 exercises. She demonstrated improved tandem standing balance today and initiated hurdle step over to challenge SLS. Standing heel/toe raises was added to her HEP as she demonstrated good form and height with this. She will continue to benefit from skilled PT interventions to address impairments and progress endurance and safety with functional mobility.    Rehab Potential  Fair    PT Frequency  2x / week    PT Duration  4 weeks    PT Treatment/Interventions  ADLs/Self Care Home Management;Aquatic Therapy;DME Instruction;Gait training;Stair training;Functional mobility training;Therapeutic activities;Therapeutic exercise;Manual techniques;Patient/family education;Neuromuscular re-education;Balance training;Energy conservation    PT Next Visit Plan  Continue with standing exercises and balance training. Add standing hip ext/abd to HEP.    PT Home Exercise Plan  decompression exercises 1-5; log roll; side-lying to/from sit; money in sitting; seated marching, sit to stand, LAQ; 09/18/17 - standing heel/toe raises;     Consulted and Agree with Plan of Care  Patient;Family member/caregiver    Family Member Consulted  niece       Patient will benefit from skilled therapeutic intervention in order to improve the following deficits and impairments:  Abnormal gait, Decreased endurance, Decreased activity tolerance, Decreased strength, Pain, Decreased balance, Decreased mobility, Difficulty walking, Postural dysfunction  Visit Diagnosis: Muscle weakness (generalized)  Other abnormalities of gait and mobility  Difficulty in walking, not elsewhere classified     Problem List Patient Active Problem List   Diagnosis Date Noted  . Vulvar discomfort 08/07/2017  . Nocturia 07/27/2014  . Recurrent cystitis 07/08/2013  . Atrophic vaginitis 07/08/2013  . Internal hemorrhoids with complication 16/10/9602  . Stiffness of joints, not elsewhere classified, multiple sites 11/19/2012  . Poor balance  11/19/2012  . Hip pain, right 11/20/2011  . Back pain, lumbosacral 11/20/2011  . Dyspepsia 07/19/2011  . Tubulovillous adenoma   . HYPERLIPIDEMIA 08/20/2007  . GLAUCOMA 08/20/2007  . HYPERTENSION 08/20/2007  . Gastroesophageal reflux disease 08/20/2007  . IRRITABLE BOWEL SYNDROME 08/20/2007    Kipp Brood, PT, DPT Physical Therapist with [facility]  09/18/2017 5:09 PM    [facility] [address] [place], [place], [address] Phone: [phone]   Fax:  [phone]  Name: VIONA HOSKING MRN: [id number] Date of Birth: Jun 06, 1917

## 2017-09-22 ENCOUNTER — Telehealth (HOSPITAL_COMMUNITY): Payer: Self-pay | Admitting: Internal Medicine

## 2017-09-22 NOTE — Telephone Encounter (Signed)
09/22/17  message was left to cx appt but no reason was given

## 2017-09-23 ENCOUNTER — Encounter (HOSPITAL_COMMUNITY): Payer: Medicare Other

## 2017-09-23 DIAGNOSIS — M79675 Pain in left toe(s): Secondary | ICD-10-CM | POA: Diagnosis not present

## 2017-09-23 DIAGNOSIS — B351 Tinea unguium: Secondary | ICD-10-CM | POA: Diagnosis not present

## 2017-09-23 DIAGNOSIS — M79674 Pain in right toe(s): Secondary | ICD-10-CM | POA: Diagnosis not present

## 2017-09-25 ENCOUNTER — Ambulatory Visit (HOSPITAL_COMMUNITY): Payer: Medicare Other

## 2017-09-25 DIAGNOSIS — M6281 Muscle weakness (generalized): Secondary | ICD-10-CM

## 2017-09-25 DIAGNOSIS — R262 Difficulty in walking, not elsewhere classified: Secondary | ICD-10-CM

## 2017-09-25 DIAGNOSIS — R2689 Other abnormalities of gait and mobility: Secondary | ICD-10-CM

## 2017-09-25 NOTE — Therapy (Signed)
[facility] [address] [place], [place], [address] Phone: [phone]   Fax:  [phone]  Physical Therapy Treatment  Patient Details  Name: Deborah Farrell MRN: [id number] Date of Birth: 1917/01/31 Referring Provider: Dr Allyn Kenner, MD   Encounter Date: 09/25/2017  PT End of Session - 09/25/17 1342    Visit Number  10    Number of Visits  16    Date for PT Re-Evaluation  10/13/17   mini-re-assess 09/15/17   Authorization Type  BCBS Medicare HMO; no deduct; copay $40.00 per visit; co insurance [place] $[id number]    Authorization Time Period  Cert = 09/18/47 - 6/75/91 (01-14-17 thru 01-13-18)    Authorization - Visit Number  3    Authorization - Number of Visits  10    PT Start Time  1346    PT Stop Time  1431    PT Time Calculation (min)  45 min    Activity Tolerance  Patient tolerated treatment well    Behavior During Therapy  [facility] for tasks assessed/performed       Past Medical History:  Diagnosis Date  . Allergy history, radiographic dye   . Arthritis   . Dysuria   . Glaucoma   . HTN (hypertension)   . IBS (irritable bowel syndrome)    Non-ulcer dyspepsia; hemorrhoids  . Overactive bladder   . 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 Urinary frequency   . Urinary tract infection     Past Surgical History:  Procedure Laterality Date  . APPENDECTOMY    . CHOLECYSTECTOMY    . COLONOSCOPY  2008, 2011   diverticulitis diagnosed, 131msessile polyp removed inascending colon. area tattood for future observation and reccomended f/u colonoscopy in one year  . COLONOSCOPY N/A 04/24/2012   SLF:The colon IS redundant/ Single RECTAL polyp/Moderate diverticulosis/Small internal hemorrhoids  . OVARIAN CYST REMOVAL    . VAGINAL HYSTERECTOMY      There were no vitals filed for this visit.  Subjective Assessment -  09/25/17 1342    Subjective  Patient reports her Rt hip is still bothering her. Both knees and right hip really painful and sore after last treatment session. Thinks it was the sit down and stand up exercises.     Patient is accompained by:  Family member   niece   Limitations  Standing;Walking;House hold activities    How long can you stand comfortably?  5 minutes, limited by back and knee pain    How long can you walk comfortably?  with Rollator, can walk 400-500 feet before needing to rest due to pain    Patient Stated Goals  to be able to walk better with less pain    Currently in Pain?  Yes    Pain Score  9     Pain Location  Hip    Pain Orientation  Right;Posterior    Pain Descriptors / Indicators  Aching;Sore    Pain Onset  In the past 7 days                       [facility] - 09/25/17 0001      Lumbar Exercises: Standing   Other Standing Lumbar Exercises  hip abduction, extension 1x 10 reps bil LE    Other Standing Lumbar Exercises  heel/toe raises  Lumbar Exercises: Seated   Other Seated Lumbar Exercises  Money: bil shoulder er, 2x 10 reps with 3 second holds    Other Seated Lumbar Exercises  heel/toe raises      Lumbar Exercises: Supine   Bridge  10 reps;3 seconds      Knee/Hip Exercises: Standing   Hip Abduction  Stengthening;Both;1 set;10 reps      Knee/Hip Exercises: Seated   Marching  Both;2 sets;10 reps      Manual Therapy   Manual Therapy  Muscle Energy Technique;Soft tissue mobilization    Manual therapy comments  in left side-lying    Soft tissue mobilization  to right SI jt, gluts/piriformis and ITB    Muscle Energy Technique  hook-lying hip add followed by hip abd 3RT   given as HEP w/ neice, towel roll and RTB            PT Education - 09/25/17 1446    Education Details  Educated on new HEP and exercises technique and purpose throughout session.    Person(s) Educated  Patient   niece   Methods   Explanation;Demonstration;Handout    Comprehension  Verbalized understanding;Returned demonstration       PT Short Term Goals - 09/25/17 1451      PT SHORT TERM GOAL #1   Title  Patient will be independent with HEP and perform regularly at home.     Baseline  at least every other day    Time  4    Period  Weeks    Status  Partially Met      PT SHORT TERM GOAL #2   Title  Patient will exhibit a 1/2 grade improvement in MMT for bilateral lower extremities to indicate improved strength and functional balance ability.     Time  4    Period  Weeks    Status  Partially Met      PT SHORT TERM GOAL #3   Title  Patient will complete TUG in <50 seconds to indicate improvement in functional balance and decreased risk for falls.     Time  4    Period  Weeks    Status  Achieved      PT SHORT TERM GOAL #4   Title  Patient will improve 2MWT by 25 feet to indicate improved balance, functional strength and endurance for activity and decreased risk for falls.     Status  Achieved        PT Long Term Goals - 09/25/17 1452      PT LONG TERM GOAL #1   Title  Patient will be independent with advanced HEP and perform regularly at home.     Time  8    Period  Weeks    Status  On-going      PT LONG TERM GOAL #2   Title  Patient will exhibit a 1 grade improvement in MMT for bilateral lower extremities to indicate improved strength and functional balance ability.     Time  8    Period  Weeks    Status  On-going      PT LONG TERM GOAL #3   Title  Patient will complete TUG in <40 seconds to indicate improvement in functional balance and decreased risk for falls.     Time  8    Period  Weeks    Status  On-going      PT LONG TERM GOAL #4   Title  Patient will improve 2MWT  by 50 feet to indicate improved balance, functional strength and endurance for activity and decreased risk for falls.     Time  8    Period  Weeks    Status  On-going            Plan - 09/25/17 1342    Clinical  Impression Statement  Session focus continued on LE strengthening; sit-to-stand held today due to bilateral knee pain after last session. Patient demonstrated good activity tolerance and required intermittent rest breaks. Added manual therapy for pain control on right SI joint, gluteals/piriformis and ITB band. At EOS patient reported 5/10 pain in right sacroiliac joint. Added SI joint muscle energy technique to HEP with niece to confirm technique at home. Patient  will continue to benefit from skilled PT interventions to address impairments and progress endurance and safety with functional mobility.     Rehab Potential  Fair    PT Frequency  2x / week    PT Duration  4 weeks    PT Treatment/Interventions  ADLs/Self Care Home Management;Aquatic Therapy;DME Instruction;Gait training;Stair training;Functional mobility training;Therapeutic activities;Therapeutic exercise;Manual techniques;Patient/family education;Neuromuscular re-education;Balance training;Energy conservation    PT Next Visit Plan  Continue with standing exercises and balance training. Add standing hip ext/abd to HEP. Possibly hold STS due to bilat knee pain    PT Home Exercise Plan  decompression exercises 1-5; log roll; side-lying to/from sit; money in sitting; seated marching, sit to stand, LAQ; 09/18/17 - standing heel/toe raises;     Consulted and Agree with Plan of Care  Patient;Family member/caregiver    Family Member Consulted  niece       Patient will benefit from skilled therapeutic intervention in order to improve the following deficits and impairments:  Abnormal gait, Decreased endurance, Decreased activity tolerance, Decreased strength, Pain, Decreased balance, Decreased mobility, Difficulty walking, Postural dysfunction  Visit Diagnosis: Muscle weakness (generalized)  Other abnormalities of gait and mobility  Difficulty in walking, not elsewhere classified     Problem List Patient Active Problem List   Diagnosis  Date Noted  . Vulvar discomfort 08/07/2017  . Nocturia 07/27/2014  . Recurrent cystitis 07/08/2013  . Atrophic vaginitis 07/08/2013  . Internal hemorrhoids with complication 08/81/1031  . Stiffness of joints, not elsewhere classified, multiple sites 11/19/2012  . Poor balance 11/19/2012  . Hip pain, right 11/20/2011  . Back pain, lumbosacral 11/20/2011  . Dyspepsia 07/19/2011  . Tubulovillous adenoma   . HYPERLIPIDEMIA 08/20/2007  . GLAUCOMA 08/20/2007  . HYPERTENSION 08/20/2007  . Gastroesophageal reflux disease 08/20/2007  . IRRITABLE BOWEL SYNDROME 08/20/2007    Floria Raveling. Hartnett-Rands, MS, PT [facility] #[id number] 09/25/2017, 2:52 PM  [facility] [address] [place], [place], [address] Phone: [phone]   Fax:  [phone]  Name: PRINCESS KARNES MRN: [id number] Date of Birth: Apr 06, 1917

## 2017-09-25 NOTE — Patient Instructions (Signed)
Hook-lying muscle energy technique for sacroiliac joint with towel roll and RTB - in/out sec each; 3 rounds through for right SI joint pain relief

## 2017-09-29 DIAGNOSIS — Z6822 Body mass index (BMI) 22.0-22.9, adult: Secondary | ICD-10-CM | POA: Diagnosis not present

## 2017-09-29 DIAGNOSIS — D649 Anemia, unspecified: Secondary | ICD-10-CM | POA: Diagnosis not present

## 2017-09-29 DIAGNOSIS — F419 Anxiety disorder, unspecified: Secondary | ICD-10-CM | POA: Diagnosis not present

## 2017-09-29 DIAGNOSIS — Z022 Encounter for examination for admission to residential institution: Secondary | ICD-10-CM | POA: Diagnosis not present

## 2017-09-29 DIAGNOSIS — R4182 Altered mental status, unspecified: Secondary | ICD-10-CM | POA: Diagnosis not present

## 2017-09-29 DIAGNOSIS — B372 Candidiasis of skin and nail: Secondary | ICD-10-CM | POA: Diagnosis not present

## 2017-09-29 DIAGNOSIS — Z23 Encounter for immunization: Secondary | ICD-10-CM | POA: Diagnosis not present

## 2017-09-29 DIAGNOSIS — D509 Iron deficiency anemia, unspecified: Secondary | ICD-10-CM | POA: Diagnosis not present

## 2017-09-30 ENCOUNTER — Ambulatory Visit (HOSPITAL_COMMUNITY): Payer: Medicare Other | Admitting: Physical Therapy

## 2017-09-30 DIAGNOSIS — R262 Difficulty in walking, not elsewhere classified: Secondary | ICD-10-CM

## 2017-09-30 DIAGNOSIS — R2689 Other abnormalities of gait and mobility: Secondary | ICD-10-CM

## 2017-09-30 DIAGNOSIS — M6281 Muscle weakness (generalized): Secondary | ICD-10-CM

## 2017-09-30 NOTE — Therapy (Signed)
[facility] [address] [place], [place], [address] Phone: [phone]   Fax:  [phone]  Physical Therapy Treatment  Patient Details  Name: Deborah Farrell MRN: [id number] Date of Birth: 09/15/1917 Referring Provider: Dr Allyn Kenner, MD   Encounter Date: 09/30/2017  PT End of Session - 09/30/17 1539    Visit Number  11    Number of Visits  16    Date for PT Re-Evaluation  10/13/17   mini-re-assess 09/15/17   Authorization Type  BCBS Medicare HMO; no deduct; copay $40.00 per visit; co insurance [place] [id number]    Authorization Time Period  Cert = 5/0/53 - 9/76/73 (01-14-17 thru 01-13-18)    Authorization - Visit Number  3    Authorization - Number of Visits  10    PT Start Time  4193    PT Stop Time  1517    PT Time Calculation (min)  45 min    Activity Tolerance  Patient tolerated treatment well    Behavior During Therapy  [facility] for tasks assessed/performed       Past Medical History:  Diagnosis Date  . Allergy history, radiographic dye   . Arthritis   . Dysuria   . Glaucoma   . HTN (hypertension)   . IBS (irritable bowel syndrome)    Non-ulcer dyspepsia; hemorrhoids  . Overactive bladder   . 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 Urinary frequency   . Urinary tract infection     Past Surgical History:  Procedure Laterality Date  . APPENDECTOMY    . CHOLECYSTECTOMY    . COLONOSCOPY  2008, 2011   diverticulitis diagnosed, 128msessile polyp removed inascending colon. area tattood for future observation and reccomended f/u colonoscopy in one year  . COLONOSCOPY N/A 04/24/2012   SLF:The colon IS redundant/ Single RECTAL polyp/Moderate diverticulosis/Small internal hemorrhoids  . OVARIAN CYST REMOVAL    . VAGINAL HYSTERECTOMY      There were no vitals filed for this visit.  Subjective Assessment -  09/30/17 1459    Subjective  Pt's neice with patient.  States they tried the clam with theraband, however it was too hard and she was too sore to use it.      Currently in Pain?  Yes    Pain Score  4     Pain Location  Hip    Pain Orientation  Right    Pain Descriptors / Indicators  Aching;Sore    Pain Type  Acute pain                       [facility] Adult PT Treatment/Exercise - 09/30/17 0001      Lumbar Exercises: Standing   Other Standing Lumbar Exercises  hip abduction, extension 1x 10 reps bil LE    Other Standing Lumbar Exercises  heel/toe raises 10 reps      Lumbar Exercises: Seated   Long Arc Quad on Chair  Both;2 sets;10 reps    Other Seated Lumbar Exercises  Money: bil shoulder er, 2x 10 reps with 3 second holds      Lumbar Exercises: Supine   Clam  10 reps   no  band   Bent Knee Raise  15 reps    Bridge  10 reps;3 seconds   2 sets with ball between knees     Lumbar  Exercises: Sidelying   Clam  Both;10 reps   2 sets     Manual Therapy   Manual Therapy  Soft tissue mobilization    Manual therapy comments  in left side-lying    Soft tissue mobilization  to right SI jt, gluts/piriformis and ITB               PT Short Term Goals - 09/25/17 1451      PT SHORT TERM GOAL #1   Title  Patient will be independent with HEP and perform regularly at home.     Baseline  at least every other day    Time  4    Period  Weeks    Status  Partially Met      PT SHORT TERM GOAL #2   Title  Patient will exhibit a 1/2 grade improvement in MMT for bilateral lower extremities to indicate improved strength and functional balance ability.     Time  4    Period  Weeks    Status  Partially Met      PT SHORT TERM GOAL #3   Title  Patient will complete TUG in <50 seconds to indicate improvement in functional balance and decreased risk for falls.     Time  4    Period  Weeks    Status  Achieved      PT SHORT TERM GOAL #4   Title  Patient will improve 2MWT by  25 feet to indicate improved balance, functional strength and endurance for activity and decreased risk for falls.     Status  Achieved        PT Long Term Goals - 09/25/17 1452      PT LONG TERM GOAL #1   Title  Patient will be independent with advanced HEP and perform regularly at home.     Time  8    Period  Weeks    Status  On-going      PT LONG TERM GOAL #2   Title  Patient will exhibit a 1 grade improvement in MMT for bilateral lower extremities to indicate improved strength and functional balance ability.     Time  8    Period  Weeks    Status  On-going      PT LONG TERM GOAL #3   Title  Patient will complete TUG in <40 seconds to indicate improvement in functional balance and decreased risk for falls.     Time  8    Period  Weeks    Status  On-going      PT LONG TERM GOAL #4   Title  Patient will improve 2MWT by 50 feet to indicate improved balance, functional strength and endurance for activity and decreased risk for falls.     Time  8    Period  Weeks    Status  On-going            Plan - 09/30/17 1541    Clinical Impression Statement  Continued with strengthening exercises, however modified clams to be completed without theraband and in sidelying position for gravity resistance only.  Pt reported more tolerable and without pain. Pt required cues to complete    Rehab Potential  Fair    PT Frequency  2x / week    PT Duration  4 weeks    PT Treatment/Interventions  ADLs/Self Care Home Management;Aquatic Therapy;DME Instruction;Gait training;Stair training;Functional mobility training;Therapeutic activities;Therapeutic exercise;Manual techniques;Patient/family education;Neuromuscular re-education;Balance training;Energy conservation  PT Next Visit Plan  Continue with standing exercises and balance training. Increase difficulty of therex/challenge as tolerated.  Add vectors next session.     PT Home Exercise Plan  decompression exercises 1-5; log roll; side-lying  to/from sit; money in sitting; seated marching, sit to stand, LAQ; 09/18/17 - standing heel/toe raises;     Consulted and Agree with Plan of Care  Patient;Family member/caregiver    Family Member Consulted  niece       Patient will benefit from skilled therapeutic intervention in order to improve the following deficits and impairments:  Abnormal gait, Decreased endurance, Decreased activity tolerance, Decreased strength, Pain, Decreased balance, Decreased mobility, Difficulty walking, Postural dysfunction  Visit Diagnosis: Muscle weakness (generalized)  Other abnormalities of gait and mobility  Difficulty in walking, not elsewhere classified     Problem List Patient Active Problem List   Diagnosis Date Noted  . Vulvar discomfort 08/07/2017  . Nocturia 07/27/2014  . Recurrent cystitis 07/08/2013  . Atrophic vaginitis 07/08/2013  . Internal hemorrhoids with complication 12/81/1886  . Stiffness of joints, not elsewhere classified, multiple sites 11/19/2012  . Poor balance 11/19/2012  . Hip pain, right 11/20/2011  . Back pain, lumbosacral 11/20/2011  . Dyspepsia 07/19/2011  . Tubulovillous adenoma   . HYPERLIPIDEMIA 08/20/2007  . GLAUCOMA 08/20/2007  . HYPERTENSION 08/20/2007  . Gastroesophageal reflux disease 08/20/2007  . IRRITABLE BOWEL SYNDROME 08/20/2007    Teena Irani, PTA/CLT [phone]  Teena Irani 09/30/2017, 3:52 PM  [facility] [address] [place], [place], [address] Phone: [phone]   Fax:  [phone]  Name: Deborah Farrell MRN: [id number] Date of Birth: 03/10/1917

## 2017-10-02 ENCOUNTER — Ambulatory Visit (HOSPITAL_COMMUNITY): Payer: Medicare Other | Admitting: Physical Therapy

## 2017-10-02 ENCOUNTER — Telehealth (HOSPITAL_COMMUNITY): Payer: Self-pay | Admitting: Internal Medicine

## 2017-10-02 NOTE — Telephone Encounter (Signed)
10/02/17  the lady that brings her called and cx said that she wasn't feeling well today

## 2017-10-06 ENCOUNTER — Telehealth (HOSPITAL_COMMUNITY): Payer: Self-pay | Admitting: Internal Medicine

## 2017-10-06 NOTE — Telephone Encounter (Signed)
10/06/17  The lady that brings Deborah Farrell left a message to cx appointments because Dr. Nevada Crane said to stop at this time

## 2017-10-07 ENCOUNTER — Ambulatory Visit (HOSPITAL_COMMUNITY): Payer: Medicare Other | Admitting: Physical Therapy

## 2017-10-09 ENCOUNTER — Encounter (HOSPITAL_COMMUNITY): Payer: Medicare Other

## 2017-10-21 ENCOUNTER — Ambulatory Visit: Payer: Medicare Other | Admitting: Gastroenterology

## 2017-10-21 ENCOUNTER — Encounter: Payer: Self-pay | Admitting: Gastroenterology

## 2017-10-21 VITALS — BP 111/55 | HR 60 | Temp 97.5°F | Ht 65.0 in | Wt 140.0 lb

## 2017-10-21 DIAGNOSIS — Z86010 Personal history of colonic polyps: Secondary | ICD-10-CM

## 2017-10-21 DIAGNOSIS — K5900 Constipation, unspecified: Secondary | ICD-10-CM

## 2017-10-21 NOTE — Assessment & Plan Note (Signed)
To discuss with Dr. Oneida Alar per patient requests, but would consider NO future surveillance colonoscopies unless new symptoms arise.

## 2017-10-21 NOTE — Progress Notes (Signed)
cc'd to pcp 

## 2017-10-21 NOTE — Patient Instructions (Signed)
1. Continue milk of magnesium as you are doing. If you have further problems controlling constipation let us know. 2. I will discuss with Dr. Oneida Alar regarding possible colonoscopy for history of colon polyps. I don't think she will advise it at this time given age and new guidelines. Further recommendations to follow.  3. Return to the office in six months or sooner if needed.

## 2017-10-21 NOTE — Assessment & Plan Note (Signed)
Clinically doing well with current regimen.  Continue for now. Return to the office in six months or sooner if needed.

## 2017-10-21 NOTE — Progress Notes (Addendum)
REVIEWED. BENEFITS OF TCS DO NOT OUTWEIGH THE RISKS. OK TO USE MOM OR MIRALAX FOR CONSTIPATION.    Primary Care Physician: Celene Squibb, MD  Primary Gastroenterologist:  Barney Drain, MD   Chief Complaint  Patient presents with  . Constipation    HPI: Deborah Farrell is a 84 y.o. female here for follow-up of constipation.  Seen back in February.  With MiraLAX, developed diarrhea.  Was using Benefiber once to twice daily.  Was given modified dose of Linzess but could not afford it. Patient went to ED in June for constipation and was disimpacted.   Currently taking 30 cc of milk of magnesia every other day as advised by PCP.  Working well for her.  No blood per rectum.  No abdominal pain.  Appetite is good.  No upper GI symptoms.  Continues pantoprazole, underlying.  Compazine as needed for nausea.  Patient has a history of tubulovillous adenoma back in 2008.  Her last colonoscopy was in 2014, single rectal polyp which was benign with no adenomatous changes was removed.:  Redundant.  Moderate diverticulosis.  Small internal hemorrhoids.  Recommended possible 5-year follow-up colonoscopy benefits outweigh the risk.  Patient came up for recall.  She really is not interested in pursuing a colonoscopy unless Dr. Oneida Alar advised that it is absolutely necessary.  We discussed changing guidelines since her last colonoscopy.  Given she has no new symptoms, states that she needed another colonoscopy at age 69 but will discuss with Dr. Oneida Alar per patient's request.     Current Outpatient Medications  Medication Sig Dispense Refill  . acetaminophen (TYLENOL) 500 MG tablet Take 1,000 mg by mouth as needed.     . ALPRAZolam (XANAX) 0.5 MG tablet Take 0.5 mg by mouth at bedtime as needed for anxiety.    Marland Kitchen amLODipine-valsartan (EXFORGE) 5-320 MG per tablet Take 0.5 tablets by mouth daily.     . carvedilol (COREG) 12.5 MG tablet Take 6.25 mg ( 1/2 tablet in the am) and 12. 5 mg (whole tablet) in the pm  135 tablet 3  . chlorthalidone (HYGROTON) 25 MG tablet Take 12.5 mg by mouth as needed. Take 1/2 tab    . estradiol (ESTRACE VAGINAL) 0.1 MG/GM vaginal cream Place 1 Applicatorful vaginally daily. (Patient taking differently: Place 1 Applicatorful vaginally as needed. ) 4 g 0  . hydrocortisone cream 1 % APPLY TO THE AFFECTED AREAS DAILY (Patient taking differently: as needed. ) 28.35 g 0  . latanoprost (XALATAN) 0.005 % ophthalmic solution Place 1 drop into both eyes at bedtime.      . Lidocaine-Hydrocortisone Ace 3-2.5 % KIT APPLY TO RECTUM QID AS NEEDED FOR RECTAL PAIN OR BLEEDING 1 each 3  . Magnesium Hydroxide (MILK OF MAGNESIA PO) Take by mouth as needed. Takes 82m by mouth as needed    . memantine (NAMENDA) 10 MG tablet Take 10 mg by mouth 2 (two) times daily.    .Marland KitchenMYRBETRIQ 25 MG TB24 tablet TK 1 T PO D  6  . pantoprazole (PROTONIX) 40 MG tablet TAKE 1 TABLET BY MOUTH DAILY 90 tablet 3  . PRESCRIPTION MEDICATION Takes Iron once a day (unsure of dose)    . Probiotic Product (ALIGN PO) Take 1 tablet by mouth daily.     . prochlorperazine (COMPAZINE) 5 MG tablet TAKE 1 TABLET BY MOUTH EVERY 6 HOURS AS NEEDED FOR NAUSEA OR VOMITING 180 tablet 3   No current facility-administered medications for this visit.     Allergies  as of 10/21/2017 - Review Complete 10/21/2017  Allergen Reaction Noted  . Tramadol Anaphylaxis 03/26/2016  . Aciphex [rabeprazole] Other (See Comments) 07/25/2011  . Colchicine  08/04/2014  . Dicyclomine hcl Other (See Comments)   . Hyoscyamine sulfate Other (See Comments)   . Iodinated diagnostic agents  11/21/2010  . Nexium [esomeprazole magnesium] Other (See Comments) 08/21/2011  . Omeprazole Other (See Comments) 07/25/2010  . Sulfonamide derivatives Other (See Comments)     ROS:  General: Negative for anorexia, weight loss, fever, chills, fatigue, weakness. ENT: Negative for hoarseness, difficulty swallowing , nasal congestion. CV: Negative for chest pain,  angina, palpitations, dyspnea on exertion, peripheral edema.  Respiratory: Negative for dyspnea at rest, dyspnea on exertion, cough, sputum, wheezing.  GI: See history of present illness. GU:  Negative for dysuria, hematuria, urinary incontinence, urinary frequency, nocturnal urination.  Endo: Negative for unusual weight change.    Physical Examination:   BP (!) 111/55   Pulse 60   Temp (!) 97.5 F (36.4 C) (Oral)   Ht '5\' 5"'$  (1.651 m)   Wt 140 lb (63.5 kg)   BMI 23.30 kg/m   General: Well-nourished, well-developed in no acute distress.  Eyes: No icterus. Mouth: Oropharyngeal mucosa moist and pink , no lesions erythema or exudate. Lungs: Clear to auscultation bilaterally.  Heart: Regular rate and rhythm, no murmurs rubs or gallops.  Abdomen: Bowel sounds are normal, nontender, nondistended, no hepatosplenomegaly or masses, no abdominal bruits or hernia , no rebound or guarding.   Extremities: No lower extremity edema. No clubbing or deformities. Neuro: Alert and oriented x 4   Skin: Warm and dry, no jaundice.   Psych: Alert and cooperative, normal mood and affect.

## 2017-11-06 NOTE — Progress Notes (Signed)
Please make pt aware of SLF recommendations. Sorry for the delay.

## 2017-11-07 NOTE — Progress Notes (Signed)
PT's aunt, Izora Gala is aware.

## 2017-12-02 DIAGNOSIS — M79675 Pain in left toe(s): Secondary | ICD-10-CM | POA: Diagnosis not present

## 2017-12-02 DIAGNOSIS — M79674 Pain in right toe(s): Secondary | ICD-10-CM | POA: Diagnosis not present

## 2017-12-02 DIAGNOSIS — B351 Tinea unguium: Secondary | ICD-10-CM | POA: Diagnosis not present

## 2017-12-04 DIAGNOSIS — H26491 Other secondary cataract, right eye: Secondary | ICD-10-CM | POA: Diagnosis not present

## 2017-12-04 DIAGNOSIS — H401132 Primary open-angle glaucoma, bilateral, moderate stage: Secondary | ICD-10-CM | POA: Diagnosis not present

## 2017-12-04 DIAGNOSIS — H35372 Puckering of macula, left eye: Secondary | ICD-10-CM | POA: Diagnosis not present

## 2017-12-08 ENCOUNTER — Encounter (HOSPITAL_COMMUNITY): Payer: Self-pay

## 2017-12-08 NOTE — Therapy (Signed)
[facility] [address] [place], [place], [address] Phone: [phone]   Fax:  [phone]  Patient Details  Name: Deborah Farrell MRN: [id number] Date of Birth: [birth year] Referring Provider:  No ref. provider found  Encounter Date: 12/08/2017   PHYSICAL THERAPY DISCHARGE SUMMARY  Visits from Start of Care: 11  Current functional level related to goals / functional outcomes: Patient participated in therapy from 08/18/17-09/30/17. She made some progress as seen by her goal status below. She cancelled her last 2 appointments and was told by Dr. Nevada Crane to stop therapy as she has been experiencing some hip pain. She has not returned since calling on 10/06/17 to cancel her appointments and will be discharged from this episode.    Remaining deficits:  Goal status at last visit: 09/30/17 PT Short Term Goals - 09/25/17 1451            PT SHORT TERM GOAL #1   Title  Patient will be independent with HEP and perform regularly at home.     Baseline  at least every other day    Time  4    Period  Weeks    Status  Partially Met        PT SHORT TERM GOAL #2   Title  Patient will exhibit a 1/2 grade improvement in MMT for bilateral lower extremities to indicate improved strength and functional balance ability.     Time  4    Period  Weeks    Status  Partially Met        PT SHORT TERM GOAL #3   Title  Patient will complete TUG in <50 seconds to indicate improvement in functional balance and decreased risk for falls.     Time  4    Period  Weeks    Status  Achieved        PT SHORT TERM GOAL #4   Title  Patient will improve 2MWT by 25 feet to indicate improved balance, functional strength and endurance for activity and decreased risk for falls.     Status  Achieved           PT Long Term Goals - 09/25/17 1452            PT LONG TERM GOAL #1   Title  Patient will be independent with advanced HEP and perform  regularly at home.     Time  8    Period  Weeks    Status  On-going        PT LONG TERM GOAL #2   Title  Patient will exhibit a 1 grade improvement in MMT for bilateral lower extremities to indicate improved strength and functional balance ability.     Time  8    Period  Weeks    Status  On-going        PT LONG TERM GOAL #3   Title  Patient will complete TUG in <40 seconds to indicate improvement in functional balance and decreased risk for falls.     Time  8    Period  Weeks    Status  On-going        PT LONG TERM GOAL #4   Title  Patient will improve 2MWT by 50 feet to indicate improved balance, functional strength and endurance for activity and decreased risk for falls.     Time  8    Period  Weeks    Status  On-going  Education / Equipment: Patient provided HEP throughout and educated on benefits of therapy and attendance policy.   Plan: Patient agrees to discharge.  Patient goals were partially met. Patient is being discharged due to the patient's request.  ?????     Kipp Brood, PT, DPT Physical Therapist with [facility]  12/08/2017 11:48 AM     [place], [place], [address] Phone: [phone]   Fax:  [phone]

## 2017-12-09 ENCOUNTER — Other Ambulatory Visit: Payer: Self-pay | Admitting: Gastroenterology

## 2018-02-05 ENCOUNTER — Encounter (HOSPITAL_COMMUNITY): Payer: Self-pay | Admitting: Emergency Medicine

## 2018-02-05 ENCOUNTER — Other Ambulatory Visit: Payer: Self-pay

## 2018-02-05 ENCOUNTER — Emergency Department (HOSPITAL_COMMUNITY): Payer: Medicare Other

## 2018-02-05 ENCOUNTER — Observation Stay (HOSPITAL_COMMUNITY)
Admission: EM | Admit: 2018-02-05 | Discharge: 2018-02-06 | Disposition: A | Discharge status: Home / Self Care | Payer: Medicare Other | Attending: Family Medicine | Admitting: Family Medicine

## 2018-02-05 DIAGNOSIS — R509 Fever, unspecified: Secondary | ICD-10-CM | POA: Insufficient documentation

## 2018-02-05 DIAGNOSIS — H409 Unspecified glaucoma: Secondary | ICD-10-CM | POA: Diagnosis not present

## 2018-02-05 DIAGNOSIS — E860 Dehydration: Secondary | ICD-10-CM | POA: Diagnosis not present

## 2018-02-05 DIAGNOSIS — A09 Infectious gastroenteritis and colitis, unspecified: Secondary | ICD-10-CM | POA: Diagnosis not present

## 2018-02-05 DIAGNOSIS — E785 Hyperlipidemia, unspecified: Secondary | ICD-10-CM | POA: Diagnosis not present

## 2018-02-05 DIAGNOSIS — K219 Gastro-esophageal reflux disease without esophagitis: Secondary | ICD-10-CM | POA: Diagnosis not present

## 2018-02-05 DIAGNOSIS — K589 Irritable bowel syndrome without diarrhea: Secondary | ICD-10-CM | POA: Diagnosis present

## 2018-02-05 DIAGNOSIS — R531 Weakness: Secondary | ICD-10-CM

## 2018-02-05 DIAGNOSIS — R945 Abnormal results of liver function studies: Secondary | ICD-10-CM | POA: Diagnosis not present

## 2018-02-05 DIAGNOSIS — R197 Diarrhea, unspecified: Secondary | ICD-10-CM | POA: Diagnosis not present

## 2018-02-05 DIAGNOSIS — F0390 Unspecified dementia without behavioral disturbance: Secondary | ICD-10-CM | POA: Insufficient documentation

## 2018-02-05 DIAGNOSIS — I10 Essential (primary) hypertension: Secondary | ICD-10-CM | POA: Insufficient documentation

## 2018-02-05 DIAGNOSIS — R51 Headache: Secondary | ICD-10-CM | POA: Diagnosis not present

## 2018-02-05 DIAGNOSIS — R2689 Other abnormalities of gait and mobility: Secondary | ICD-10-CM | POA: Diagnosis not present

## 2018-02-05 DIAGNOSIS — R748 Abnormal levels of other serum enzymes: Secondary | ICD-10-CM

## 2018-02-05 DIAGNOSIS — R1110 Vomiting, unspecified: Secondary | ICD-10-CM

## 2018-02-05 DIAGNOSIS — K5289 Other specified noninfective gastroenteritis and colitis: Principal | ICD-10-CM | POA: Insufficient documentation

## 2018-02-05 DIAGNOSIS — R260 Ataxic gait: Secondary | ICD-10-CM | POA: Diagnosis not present

## 2018-02-05 DIAGNOSIS — R7989 Other specified abnormal findings of blood chemistry: Secondary | ICD-10-CM | POA: Diagnosis not present

## 2018-02-05 DIAGNOSIS — K5790 Diverticulosis of intestine, part unspecified, without perforation or abscess without bleeding: Secondary | ICD-10-CM | POA: Insufficient documentation

## 2018-02-05 DIAGNOSIS — R112 Nausea with vomiting, unspecified: Secondary | ICD-10-CM | POA: Diagnosis not present

## 2018-02-05 DIAGNOSIS — Z79899 Other long term (current) drug therapy: Secondary | ICD-10-CM | POA: Diagnosis not present

## 2018-02-05 DIAGNOSIS — K529 Noninfective gastroenteritis and colitis, unspecified: Secondary | ICD-10-CM

## 2018-02-05 LAB — COMPREHENSIVE METABOLIC PANEL
ALT: 196 U/L — ABNORMAL HIGH (ref 0–44)
AST: 165 U/L — ABNORMAL HIGH (ref 15–41)
Albumin: 4.3 g/dL (ref 3.5–5.0)
Alkaline Phosphatase: 80 U/L (ref 38–126)
Anion gap: 12 (ref 5–15)
BUN: 24 mg/dL — ABNORMAL HIGH (ref 8–23)
CHLORIDE: 105 mmol/L (ref 98–111)
CO2: 22 mmol/L (ref 22–32)
Calcium: 9.3 mg/dL (ref 8.9–10.3)
Creatinine, Ser: 1.03 mg/dL — ABNORMAL HIGH (ref 0.44–1.00)
GFR calc Af Amer: 52 mL/min — ABNORMAL LOW (ref >60)
GFR calc non Af Amer: 45 mL/min — ABNORMAL LOW (ref >60)
GLUCOSE: 95 mg/dL (ref 70–99)
Potassium: 3.5 mmol/L (ref 3.5–5.1)
Sodium: 139 mmol/L (ref 135–145)
Total Bilirubin: 1.3 mg/dL — ABNORMAL HIGH (ref 0.3–1.2)
Total Protein: 7.7 g/dL (ref 6.5–8.1)

## 2018-02-05 LAB — URINALYSIS, ROUTINE W REFLEX MICROSCOPIC
BILIRUBIN URINE: NEGATIVE
Glucose, UA: NEGATIVE mg/dL
HGB URINE DIPSTICK: NEGATIVE
KETONES UR: NEGATIVE mg/dL
Nitrite: NEGATIVE
Protein, ur: NEGATIVE mg/dL
Specific Gravity, Urine: 1.009 (ref 1.005–1.030)
pH: 6 (ref 5.0–8.0)

## 2018-02-05 LAB — CBC WITH DIFFERENTIAL/PLATELET
Abs Immature Granulocytes: 0.03 10*3/uL (ref 0.00–0.07)
BASOS ABS: 0 10*3/uL (ref 0.0–0.1)
Basophils Relative: 0 %
Eosinophils Absolute: 0.2 10*3/uL (ref 0.0–0.5)
Eosinophils Relative: 2 %
HCT: 37.5 % (ref 36.0–46.0)
Hemoglobin: 11.7 g/dL — ABNORMAL LOW (ref 12.0–15.0)
Immature Granulocytes: 0 %
Lymphocytes Relative: 13 %
Lymphs Abs: 1.3 10*3/uL (ref 0.7–4.0)
MCH: 31.5 pg (ref 26.0–34.0)
MCHC: 31.2 g/dL (ref 30.0–36.0)
MCV: 100.8 fL — ABNORMAL HIGH (ref 80.0–100.0)
Monocytes Absolute: 0.6 10*3/uL (ref 0.1–1.0)
Monocytes Relative: 6 %
Neutro Abs: 8 10*3/uL — ABNORMAL HIGH (ref 1.7–7.7)
Neutrophils Relative %: 79 %
PLATELETS: 140 10*3/uL — AB (ref 150–400)
RBC: 3.72 MIL/uL — ABNORMAL LOW (ref 3.87–5.11)
RDW: 13.4 % (ref 11.5–15.5)
WBC: 10.1 10*3/uL (ref 4.0–10.5)
nRBC: 0 % (ref 0.0–0.2)

## 2018-02-05 LAB — TROPONIN I
Troponin I: 0.09 ng/mL (ref <0.03)
Troponin I: 0.11 ng/mL (ref <0.03)
Troponin I: 0.1 ng/mL (ref <0.03)

## 2018-02-05 LAB — TSH: TSH: 2.038 u[IU]/mL (ref 0.350–4.500)

## 2018-02-05 LAB — INFLUENZA PANEL BY PCR (TYPE A & B)
Influenza A By PCR: NEGATIVE
Influenza B By PCR: NEGATIVE

## 2018-02-05 LAB — MRSA PCR SCREENING: MRSA by PCR: NEGATIVE

## 2018-02-05 MED ORDER — ACETAMINOPHEN 325 MG PO TABS: 650.0000 mg | ORAL_TABLET | Freq: Four times a day (QID) | ORAL | Status: DC | PRN

## 2018-02-05 MED ORDER — CARVEDILOL 12.5 MG PO TABS
12.5000 mg | ORAL_TABLET | Freq: Every evening | ORAL | Status: DC
  Administered 2018-02-05: 12.5 mg via ORAL
  Filled 2018-02-05: qty 1

## 2018-02-05 MED ORDER — ACETAMINOPHEN 650 MG RE SUPP: 650.0000 mg | Freq: Four times a day (QID) | RECTAL | Status: DC | PRN

## 2018-02-05 MED ORDER — IRBESARTAN 150 MG PO TABS
150.0000 mg | ORAL_TABLET | Freq: Every day | ORAL | Status: DC
  Administered 2018-02-05 – 2018-02-06 (×2): 150 mg via ORAL
  Filled 2018-02-05 (×2): qty 1

## 2018-02-05 MED ORDER — ONDANSETRON HCL 4 MG PO TABS: 4.0000 mg | ORAL_TABLET | Freq: Four times a day (QID) | ORAL | Status: DC | PRN

## 2018-02-05 MED ORDER — ONDANSETRON HCL 4 MG/2ML IJ SOLN: 4.0000 mg | Freq: Four times a day (QID) | INTRAMUSCULAR | Status: DC | PRN

## 2018-02-05 MED ORDER — LATANOPROST 0.005 % OP SOLN
1.0000 [drp] | Freq: Every day | OPHTHALMIC | Status: DC
  Administered 2018-02-05: 1 [drp] via OPHTHALMIC
  Filled 2018-02-05 (×2): qty 2.5

## 2018-02-05 MED ORDER — SODIUM CHLORIDE 0.9 % IV SOLN
INTRAVENOUS | Status: DC
  Administered 2018-02-05: 21:00:00 via INTRAVENOUS

## 2018-02-05 MED ORDER — CARVEDILOL 3.125 MG PO TABS
6.2500 mg | ORAL_TABLET | Freq: Every morning | ORAL | Status: DC
  Administered 2018-02-06: 6.25 mg via ORAL
  Filled 2018-02-05: qty 2

## 2018-02-05 MED ORDER — SODIUM CHLORIDE 0.9 % IV BOLUS
500.0000 mL | Freq: Once | INTRAVENOUS | Status: AC
  Administered 2018-02-05: 500 mL via INTRAVENOUS

## 2018-02-05 MED ORDER — PANTOPRAZOLE SODIUM 40 MG PO TBEC
40.0000 mg | DELAYED_RELEASE_TABLET | Freq: Every day | ORAL | Status: DC
  Administered 2018-02-05 – 2018-02-06 (×2): 40 mg via ORAL
  Filled 2018-02-05: qty 1

## 2018-02-05 MED ORDER — AMLODIPINE BESYLATE 5 MG PO TABS
5.0000 mg | ORAL_TABLET | Freq: Every day | ORAL | Status: DC
  Administered 2018-02-05 – 2018-02-06 (×2): 5 mg via ORAL
  Filled 2018-02-05 (×2): qty 1

## 2018-02-05 MED ORDER — ENOXAPARIN SODIUM 30 MG/0.3ML ~~LOC~~ SOLN
30.0000 mg | SUBCUTANEOUS | Status: DC
  Administered 2018-02-05: 30 mg via SUBCUTANEOUS
  Filled 2018-02-05: qty 0.3

## 2018-02-05 MED ORDER — MEMANTINE HCL 10 MG PO TABS
5.0000 mg | ORAL_TABLET | Freq: Two times a day (BID) | ORAL | Status: DC
  Administered 2018-02-05 – 2018-02-06 (×2): 5 mg via ORAL
  Filled 2018-02-05 (×2): qty 1

## 2018-02-05 MED ORDER — PROCHLORPERAZINE MALEATE 5 MG PO TABS: 5.0000 mg | ORAL_TABLET | Freq: Four times a day (QID) | ORAL | Status: DC | PRN

## 2018-02-05 MED ORDER — LACTATED RINGERS IV SOLN
INTRAVENOUS | Status: DC
  Administered 2018-02-05: 18:00:00 via INTRAVENOUS

## 2018-02-05 MED ORDER — MIRABEGRON ER 25 MG PO TB24
25.0000 mg | ORAL_TABLET | Freq: Every day | ORAL | Status: DC
  Administered 2018-02-05: 25 mg via ORAL
  Filled 2018-02-05: qty 1

## 2018-02-05 MED ORDER — ALPRAZOLAM 0.5 MG PO TABS: 0.5000 mg | ORAL_TABLET | Freq: Every evening | ORAL | Status: DC | PRN

## 2018-02-05 NOTE — ED Notes (Signed)
Date and time results received: 02/05/18 0748 (use smartphrase ".now" to insert current time)  Test: trop Critical Value: 0.08  Name of Provider Notified: pollina  Orders Received? Or Actions Taken?: see chart

## 2018-02-05 NOTE — ED Provider Notes (Addendum)
[facility] EMERGENCY DEPARTMENT Provider Note   CSN: [id number] Arrival date & time: 02/05/18  0503     History   Chief Complaint Chief Complaint  Patient presents with  . Weakness    HPI Deborah Farrell is a 85 y.o. female.  Patient sent to the emergency department from skilled nursing facility for evaluation of generalized weakness.  Patient reports that she has been experiencing diarrhea for 3 days.  She did have nausea and vomiting yesterday but that has resolved.  Patient has become progressively more weak.  She reports that she has had some cramping of her abdomen at times but currently no abdominal pain.     Past Medical History:  Diagnosis Date  . Allergy history, radiographic dye   . Arthritis   . Dysuria   . Glaucoma   . HTN (hypertension)   . IBS (irritable bowel syndrome)    Non-ulcer dyspepsia; hemorrhoids  . Overactive bladder   . Tubulovillous adenoma 2007   with multifocal high grade dysplasia-[facility] 2007;TV ADENOMA w/o dysplasia 2008;  SIMPLE ADENOMA 2009; 15m SIMPLE ADENOMA JAN 2011  . Urinary frequency   . Urinary tract infection     Patient Active Problem List   Diagnosis Date Noted  . Hx of adenomatous colonic polyps 10/21/2017  . Vulvar discomfort 08/07/2017  . Nocturia 07/27/2014  . Recurrent cystitis 07/08/2013  . Atrophic vaginitis 07/08/2013  . Internal hemorrhoids with complication [id number] . Stiffness of joints, not elsewhere classified, multiple sites 11/19/2012  . Poor balance 11/19/2012  . Hip pain, right 11/20/2011  . Back pain, lumbosacral 11/20/2011  . Dyspepsia 07/19/2011  . Tubulovillous adenoma   . HYPERLIPIDEMIA 08/20/2007  . GLAUCOMA 08/20/2007  . HYPERTENSION 08/20/2007  . Gastroesophageal reflux disease 08/20/2007  . Constipation 08/20/2007  . IRRITABLE BOWEL SYNDROME 08/20/2007    Past Surgical History:  Procedure Laterality Date  . APPENDECTOMY    . CHOLECYSTECTOMY    . COLONOSCOPY  2008, 2011   diverticulitis diagnosed, 128msessile polyp removed inascending colon. area tattood for future observation and reccomended f/u colonoscopy in one year  . COLONOSCOPY N/A 04/24/2012   SLF:The colon IS redundant/ Single RECTAL polyp/Moderate diverticulosis/Small internal hemorrhoids  . OVARIAN CYST REMOVAL    . VAGINAL HYSTERECTOMY       OB History    Gravida  0   Para  0   Term  0   Preterm  0   AB  0   Living  0     SAB  0   TAB  0   Ectopic  0   Multiple  0   Live Births  0            Home Medications    Prior to Admission medications   Medication Sig Start Date End Date Taking? Authorizing Provider  acetaminophen (TYLENOL) 500 MG tablet Take 1,000 mg by mouth as needed.     [provider]  ALPRAZolam (XDuanne Moron0.5 MG tablet Take 0.5 mg by mouth at bedtime as needed for anxiety.    [provider]  amLODipine-valsartan (EXFORGE) 5-320 MG per tablet Take 0.5 tablets by mouth daily.     [provider]  carvedilol (COREG) 12.5 MG tablet Take 6.25 mg ( 1/2 tablet in the am) and 12. 5 mg (whole tablet) in the pm 03/28/17   BrArnoldo LenisMD  chlorthalidone (HYGROTON) 25 MG tablet Take 12.5 mg by mouth as needed. Take 1/2 tab    [provider]  estradiol (ESTRACE VAGINAL) 0.1 MG/GM vaginal cream Place 1 Applicatorful vaginally daily. Patient taking differently: Place 1 Applicatorful vaginally as needed.  02/23/15   Jonnie Kind, MD  hydrocortisone cream 1 % APPLY TO THE AFFECTED AREAS DAILY Patient taking differently: as needed.  06/16/17   Jonnie Kind, MD  latanoprost (XALATAN) 0.005 % ophthalmic solution Place 1 drop into both eyes at bedtime.      [provider]  Lidocaine-Hydrocortisone Ace 3-2.5 % KIT APPLY TO RECTUM QID AS NEEDED FOR RECTAL PAIN OR BLEEDING 03/04/17   Fields, Marga Melnick, MD  Magnesium Hydroxide (MILK OF MAGNESIA PO) Take by mouth as needed. Takes 45m by mouth as needed    [provider]   memantine (NAMENDA) 10 MG tablet Take 10 mg by mouth 2 (two) times daily.    [provider]  MYRBETRIQ 25 MG TB24 tablet TK 1 T PO D 03/09/16   [provider]  pantoprazole (PROTONIX) 40 MG tablet TAKE 1 TABLET BY MOUTH DAILY 12/10/17   GCarlis Stable NP  PRESCRIPTION MEDICATION Takes Iron once a day (unsure of dose)    [provider]  Probiotic Product (ALIGN PO) Take 1 tablet by mouth daily.     [provider]  prochlorperazine (COMPAZINE) 5 MG tablet TAKE 1 TABLET BY MOUTH EVERY 6 HOURS AS NEEDED FOR NAUSEA OR VOMITING 08/13/17   GCarlis Stable NP    Family History Family History  Problem Relation Age of Onset  . Stroke Mother   . Heart attack Sister   . Heart disease Sister   . Cancer Maternal Grandfather     Social History Social History   Tobacco Use  . Smoking status: Never Smoker  . Smokeless tobacco: Never Used  Substance Use Topics  . Alcohol use: No  . Drug use: No     Allergies   Tramadol; Aciphex [rabeprazole]; Colchicine; Dicyclomine hcl; Hyoscyamine sulfate; Iodinated diagnostic agents; Nexium [esomeprazole magnesium]; Omeprazole; and Sulfonamide derivatives   Review of Systems Review of Systems  Gastrointestinal: Positive for abdominal pain, diarrhea, nausea and vomiting.  All other systems reviewed and are negative.    Physical Exam Updated Vital Signs BP 133/67   Pulse 83   Temp 99.2 F (37.3 C) (Oral)   Resp (!) 21   Ht '5\' 4"'$  (1.626 m)   Wt 61.2 kg   SpO2 100%   BMI 23.17 kg/m   Physical Exam Vitals signs and nursing note reviewed.  Constitutional:      General: She is not in acute distress.    Appearance: Normal appearance. She is well-developed.  HENT:     Head: Normocephalic and atraumatic.     Right Ear: Hearing normal.     Left Ear: Hearing normal.     Nose: Nose normal.  Eyes:     Conjunctiva/sclera: Conjunctivae normal.     Pupils: Pupils are equal, round, and reactive to light.  Neck:      Musculoskeletal: Normal range of motion and neck supple.  Cardiovascular:     Rate and Rhythm: Regular rhythm.     Heart sounds: S1 normal and S2 normal. No murmur. No friction rub. No gallop.   Pulmonary:     Effort: Pulmonary effort is normal. No respiratory distress.     Breath sounds: Normal breath sounds.  Chest:     Chest wall: No tenderness.  Abdominal:     General: Bowel sounds are normal.  Palpations: Abdomen is soft.     Tenderness: There is no abdominal tenderness. There is no guarding or rebound. Negative signs include Murphy's sign and McBurney's sign.     Hernia: No hernia is present.  Musculoskeletal: Normal range of motion.  Skin:    General: Skin is warm and dry.     Findings: No rash.  Neurological:     Mental Status: She is alert and oriented to person, place, and time.     GCS: GCS eye subscore is 4. GCS verbal subscore is 5. GCS motor subscore is 6.     Cranial Nerves: No cranial nerve deficit.     Sensory: No sensory deficit.     Coordination: Coordination normal.  Psychiatric:        Speech: Speech normal.        Behavior: Behavior normal.        Thought Content: Thought content normal.      ED Treatments / Results  Labs (all labs ordered are listed, but only abnormal results are displayed) Labs Reviewed  CBC WITH DIFFERENTIAL/PLATELET - Abnormal; Notable for the following components:      Result Value   RBC 3.72 (*)    Hemoglobin 11.7 (*)    MCV 100.8 (*)    Platelets 140 (*)    Neutro Abs 8.0 (*)    All other components within normal limits  COMPREHENSIVE METABOLIC PANEL - Abnormal; Notable for the following components:   BUN 24 (*)    Creatinine, Ser 1.03 (*)    AST 165 (*)    ALT 196 (*)    Total Bilirubin 1.3 (*)    GFR calc non Af Amer 45 (*)    GFR calc Af Amer 52 (*)    All other components within normal limits  TROPONIN I - Abnormal; Notable for the following components:   Troponin I 0.09 (*)    All other components within  normal limits  C DIFFICILE QUICK SCREEN W PCR REFLEX  GASTROINTESTINAL PANEL BY PCR, STOOL (REPLACES STOOL CULTURE)  INFLUENZA PANEL BY PCR (TYPE A & B)  URINALYSIS, ROUTINE W REFLEX MICROSCOPIC    EKG EKG Interpretation  Date/Time:  Thursday February 05 2018 05:25:56 EST Ventricular Rate:  86 PR Interval:    QRS Duration: 80 QT Interval:  322 QTC Calculation: 386 R Axis:   21 Text Interpretation:  Unknown rhythm, irregular rate Borderline prolonged PR interval Nonspecific repol abnormality, diffuse leads Confirmed by Orpah Greek [phone]) on 02/05/2018 5:41:53 AM   Radiology Dg Chest 2 View  Result Date: 02/05/2018 CLINICAL DATA:  Weakness and diarrhea for 3 days. EXAM: CHEST - 2 VIEW COMPARISON:  12/05/2009 FINDINGS: Mildly low lung volumes with probable atelectatic opacity on the lateral view. There is no edema, consolidation, effusion, or pneumothorax. Stable likely normal heart size. Negative aortic and hilar contours. IMPRESSION: No evidence of acute disease. Electronically Signed   By: Monte Fantasia M.D.   On: 02/05/2018 06:27    Procedures Procedures (including critical care time)  Medications Ordered in ED Medications  sodium chloride 0.9 % bolus 500 mL (0 mLs Intravenous Stopped 02/05/18 0631)     Initial Impression / Assessment and Plan / ED Course  I have reviewed the triage vital signs and the nursing notes.  Pertinent labs & imaging results that were available during my care of the patient were reviewed by me and considered in my medical decision making (see chart for details).     Patient  presents to the emergency department for evaluation of nausea, vomiting and diarrhea.  Symptoms ongoing for several days.  She has developed generalized weakness.  EMS report that she was able to get up and walk with assistance to their stretcher.  She is not experiencing any pain currently.  Her vital signs are unremarkable.    Patient has a normal CBC.   Comprehensive metabolic panel, however, does reveal  elevated AST, ALT and total bilirubin.  Will require abdominal imaging.  Patient's troponin is slightly elevated at 0.09.  EKG does have lateral ST segment changes that are nonspecific but could be consistent with repolarization abnormality.  She has not, however, experiencing any chest pain.  Patient will be signed out to oncoming ER physician to follow-up imaging and remainder of her work-up.  Final Clinical Impressions(s) / ED Diagnoses   Final diagnoses:  Vomiting and diarrhea    ED Discharge Orders    None       Stefania Goulart, Gwenyth Allegra, MD 02/05/18 3244    Orpah Greek, MD 02/05/18 [phone]

## 2018-02-05 NOTE — ED Notes (Signed)
Patient ambulatory with assistance to bathroom.  Patient did not have a bowel movement.

## 2018-02-05 NOTE — ED Provider Notes (Signed)
Blood pressure 133/67, pulse 83, temperature 99.6 F (37.6 C), temperature source Rectal, resp. rate (!) 21, height 5\' 4"  (1.626 m), weight 61.2 kg, SpO2 100 %.  Assuming care from Dr. Betsey Holiday.  In short, Deborah Farrell is a 85 y.o. female with a chief complaint of Weakness .  Refer to the original H&P for additional details.  The current plan of care is to f/u CT abdomen/pelvis and reassess.  Repeat troponin slightly elevated to 0.11.  No diarrhea while in the emergency department.  Patient appears clinically dehydrated and LFTs are elevated.  CT without contrast shows no biliary dilation and surgically absent gallbladder.  Findings on CT support acute diarrheal illness.  I spoke with Dr. Harl Bowie by phone regarding the patient's slightly elevated troponin.  Suspects demand ischemia rather than acute MI.  Plan for trending overnight, IV fluids for dehydration, and stool studies if able to obtain. Discussed plan with patient and family at bedside.   Discussed patient's case with Hospitalist to request admission. Patient and family (if present) updated with plan. Care transferred to Hospitalist service.  I reviewed all nursing notes, vitals, pertinent old records, EKGs, labs, imaging (as available).    Margette Fast, MD 02/05/18 [phone]

## 2018-02-05 NOTE — ED Notes (Addendum)
[facility] ([facility] POA called please call with information if pt is either discharged back to facility or admitted.

## 2018-02-05 NOTE — ED Triage Notes (Signed)
Pt here from [facility] for weakness and diarrhea x 3 days

## 2018-02-05 NOTE — H&P (Signed)
History and Physical  Deborah Farrell JHE:[id number] DOB: 1917-08-05 DOA: 02/05/2018  Referring physician: Laverta Baltimore, MD  PCP: Celene Squibb, MD   Chief Complaint: generalized weakness and diarrhea   Pt coming from: [facility]   HPI: Deborah Farrell is a 85 y.o. female who presented to the ED from nursing home with complaints of 3 days of generalized weakness and diarrhea.  The patient reports that 3 residents at the facility have been sick with diarrheal illnesses.  She has had a low-grade fever.  She has had poor oral intake.  She has had difficulty with ambulation due to weakness.  She denies shortness of breath and chest pain.  She denies dysuria.  Complaints involve nausea and vomiting that has resolved in the last 24 hours.  She has been increasingly weak which prompted her referral to the emergency department.  She denies abdominal pain at this time.  ED course: The patient has been monitored in the emergency department for several hours.  The patient was noted to have a low-grade temperature of 99.6.  The patient was noted to be clinically dehydrated.  Blood pressure 111/93.  Heart rate 84.  The patient had a CT of the abdomen but there were no acute findings other than fluid levels consistent with diarrheal illness.  The patient has been started on IV fluid hydration.  Labs reveal a WBC of 10.1.  Creatinine mildly elevated at 1.03 with BUN 24.  Troponin was mildly elevated at 0.09 and repeat test was 0.11.  The patient's liver enzymes were mildly elevated with an ALT of 196 and AST 165.  The patient is being admitted for observation and supportive therapy.  The patient has not had any diarrhea since arriving in the ED today.  The patient says that she is hungry and would like to try eating now.  Review of Systems: All systems reviewed and apart from history of presenting illness, are negative.  Past Medical History:  Diagnosis Date  . Allergy history, radiographic dye   . Arthritis   .  Dysuria   . Glaucoma   . HTN (hypertension)   . IBS (irritable bowel syndrome)    Non-ulcer dyspepsia; hemorrhoids  . Overactive bladder   . 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 Urinary frequency   . Urinary tract infection    Past Surgical History:  Procedure Laterality Date  . APPENDECTOMY    . CHOLECYSTECTOMY    . COLONOSCOPY  2008, 2011   diverticulitis diagnosed, 4mm sessile polyp removed inascending colon. area tattood for future observation and reccomended f/u colonoscopy in one year  . COLONOSCOPY N/A 04/24/2012   SLF:The colon IS redundant/ Single RECTAL polyp/Moderate diverticulosis/Small internal hemorrhoids  . OVARIAN CYST REMOVAL    . VAGINAL HYSTERECTOMY     Social History:  reports that she has never smoked. She has never used smokeless tobacco. She reports that she does not drink alcohol or use drugs.  Allergies  Allergen Reactions  . Tramadol Anaphylaxis  . Aciphex [Rabeprazole] Other (See Comments)    intolerance  . Colchicine   . Dicyclomine Hcl Other (See Comments)    Unknown  . Hyoscyamine Sulfate Other (See Comments)    Unknown  . Iodinated Diagnostic Agents   . Nexium [Esomeprazole Magnesium] Other (See Comments)    G.I. Upset  . Omeprazole Other (See Comments)    GI upset  .  Sulfonamide Derivatives Other (See Comments)    Unknown    Family History  Problem Relation Age of Onset  . Stroke Mother   . Heart attack Sister   . Heart disease Sister   . Cancer Maternal Grandfather     Prior to Admission medications   Medication Sig Start Date End Date Taking? Authorizing Provider  ALPRAZolam Duanne Moron) 0.5 MG tablet Take 0.5 mg by mouth at bedtime as needed for anxiety.   Yes [provider]  amLODipine (NORVASC) 5 MG tablet Take 5 mg by mouth daily.   Yes [provider]  carvedilol (COREG) 12.5 MG tablet Take  6.25 mg ( 1/2 tablet in the am) and 12. 5 mg (whole tablet) in the pm 03/28/17  Yes Branch, Alphonse Guild, MD  Iron-FA-B Cmp-C-Biot-Probiotic (FUSION PLUS PO) Take 1 capsule by mouth daily.   Yes [provider]  latanoprost (XALATAN) 0.005 % ophthalmic solution Place 1 drop into both eyes at bedtime.     Yes [provider]  memantine (NAMENDA) 5 MG tablet Take 5 mg by mouth 2 (two) times daily.   Yes [provider]  Multiple Vitamins-Minerals (CENTRUM SILVER PO) Take 1 tablet by mouth daily.   Yes [provider]  MYRBETRIQ 25 MG TB24 tablet Take 25 mg by mouth at bedtime.  03/09/16  Yes [provider]  nystatin cream (MYCOSTATIN) Apply 1 application topically 2 (two) times daily.   Yes [provider]  olmesartan (BENICAR) 20 MG tablet Take 20 mg by mouth daily.   Yes [provider]  pantoprazole (PROTONIX) 40 MG tablet TAKE 1 TABLET BY MOUTH DAILY 12/10/17  Yes Carlis Stable, NP  Probiotic Product (ALIGN PO) Take 1 tablet by mouth daily.    Yes [provider]  prochlorperazine (COMPAZINE) 5 MG tablet TAKE 1 TABLET BY MOUTH EVERY 6 HOURS AS NEEDED FOR NAUSEA OR VOMITING 08/13/17  Yes Carlis Stable, NP   Physical Exam: Vitals:   02/05/18 0800 02/05/18 0815 02/05/18 0825 02/05/18 0830  BP: (!) 127/56   (!) 111/93  Pulse: 84 77    Resp: 15 14  (!) 27  Temp:   99.6 F (37.6 C)   TempSrc:   Rectal   SpO2: 95% 96%    Weight:      Height:         General exam: Moderately built and nourished patient, lying comfortably supine on the gurney in no obvious distress.  The patient appears younger than stated age.  Head, eyes and ENT: Nontraumatic and normocephalic. Pupils equally reacting to light and accommodation. Oral mucosa dry.  Neck: Supple. No JVD, carotid bruit or thyromegaly.  Lymphatics: No lymphadenopathy.  Respiratory system: Clear to auscultation. No increased work of breathing.  Cardiovascular system: S1 and  S2 heard, RRR. No JVD, murmurs, gallops, clicks or pedal edema.  Gastrointestinal system: Abdomen is nondistended, soft and nontender. Normal bowel sounds heard. No organomegaly or masses appreciated.  Central nervous system: Alert and oriented. No focal neurological deficits.  Extremities: Symmetric 5 x 5 power. Peripheral pulses symmetrically felt.   Skin: No rashes or acute findings.  Musculoskeletal system: Negative exam.  Psychiatry: Pleasant and cooperative.  Labs on Admission:  Basic Metabolic Panel: Recent Labs  Lab 02/05/18 0647  NA 139  K 3.5  CL 105  CO2 22  GLUCOSE 95  BUN 24*  CREATININE 1.03*  CALCIUM 9.3   Liver Function Tests: Recent Labs  Lab 02/05/18 0647  AST  165*  ALT 196*  ALKPHOS 80  BILITOT 1.3*  PROT 7.7  ALBUMIN 4.3   No results for input(s): LIPASE, AMYLASE in the last 168 hours. No results for input(s): AMMONIA in the last 168 hours. CBC: Recent Labs  Lab 02/05/18 0647  WBC 10.1  NEUTROABS 8.0*  HGB 11.7*  HCT 37.5  MCV 100.8*  PLT 140*   Cardiac Enzymes: Recent Labs  Lab 02/05/18 0647 02/05/18 0952  TROPONINI 0.09* 0.11*    BNP (last 3 results) No results for input(s): PROBNP in the last 8760 hours. CBG: No results for input(s): GLUCAP in the last 168 hours.  Radiological Exams on Admission: Ct Abdomen Pelvis Wo Contrast  Result Date: 02/05/2018 CLINICAL DATA:  Diarrhea for 3 days. EXAM: CT ABDOMEN AND PELVIS WITHOUT CONTRAST TECHNIQUE: Multidetector CT imaging of the abdomen and pelvis was performed following the standard protocol without IV contrast. COMPARISON:  CT abdomen and pelvis 12/25/2007. FINDINGS: Lower chest: There is mild dependent atelectasis in the lung bases. Heart size is upper normal. Calcific coronary artery calcifications are identified. No pleural or pericardial effusion. Hepatobiliary: No focal liver abnormality is seen. Status post cholecystectomy. No biliary dilatation. Pancreas: Unremarkable. No  pancreatic ductal dilatation or surrounding inflammatory changes. Spleen: Normal in size without focal abnormality. Adrenals/Urinary Tract: Adrenal glands are unremarkable. Kidneys are normal, without renal calculi, solid lesion, or hydronephrosis. Small left renal cyst is incidentally noted. Bladder is unremarkable. Stomach/Bowel: Scattered fluid fluid levels are seen in the colon consistent with the patient's history of diarrhea. The colon is otherwise normal appearance without evidence inflammatory change or mass. The stomach and small bowel appear normal. Vascular/Lymphatic: Aortic atherosclerosis. No enlarged abdominal or pelvic lymph nodes. Reproductive: Status post hysterectomy. No adnexal masses. Other: None. Musculoskeletal: No lytic or sclerotic lesion. Convex left scoliosis and multilevel lumbar spondylosis noted. Mild-to-moderate degenerative change is also seen about the hips. IMPRESSION: Scattered fluid fluid levels in the colon consistent with the patient's history of diarrhea. Cause for this finding is not identified. The bowel otherwise appears normal. Calcific aortic and coronary atherosclerosis. Electronically Signed   By: Inge Rise M.D.   On: 02/05/2018 12:36   Dg Chest 2 View  Result Date: 02/05/2018 CLINICAL DATA:  Weakness and diarrhea for 3 days. EXAM: CHEST - 2 VIEW COMPARISON:  12/05/2009 FINDINGS: Mildly low lung volumes with probable atelectatic opacity on the lateral view. There is no edema, consolidation, effusion, or pneumothorax. Stable likely normal heart size. Negative aortic and hilar contours. IMPRESSION: No evidence of acute disease. Electronically Signed   By: Monte Fantasia M.D.   On: 02/05/2018 06:27   EKG: Independently reviewed.  No acute ST-T wave abnormality seen.  Assessment/Plan Principal Problem:   Acute gastroenteritis Active Problems:   Hyperlipidemia   Unspecified glaucoma   Essential hypertension   Gastroesophageal reflux disease   Irritable  bowel syndrome   Diarrhea   Poor balance   Dehydration   Elevated liver enzymes   Low grade fever   Dementia (HCC)   Diverticulosis   Generalized weakness  1. Acute gastroenteritis- likely contracted from other residents at the SNF that have been ill.  Treating supportively with IV fluid hydration.  Stool testing has been ordered however she has not had diarrhea or within the last several hours while waiting in the emergency department. 2. Diarrheal illness- seems to be resolving, stool testing ordered if she has recurrent loose stools.  Continue supportive therapy. 3. Dehydration-treating with IV fluids. 4. GERD-resume Protonix daily. 5.  Glaucoma-resume home eye medications. 6. Elevated troponin -suspect this is a demand ischemia.  ED physician discussed with cardiology who recommended cycling troponins. 7. Essential hypertension- resume home blood pressure medications and follow. 8. Generalized weakness-likely secondary to #1, treating supportively and will obtain PT evaluation. 9. Dementia- we have resumed her home Namenda and encouraged family members to stay with her as much as possible while in the hospital. 10. Elevated liver enzymes- possibly reactive from acute illness, repeat in a.m. 11. Gait instability-fall precautions.  DVT Prophylaxis: Lovenox Code Status: DNR Family Communication: Nephew at bedside Disposition Plan: SNF when medically stabilized  Time spent: 32 minutes  Irwin Brakeman, MD  02/05/2018, 1:30 PM

## 2018-02-06 ENCOUNTER — Encounter (HOSPITAL_COMMUNITY): Payer: Self-pay | Admitting: Family Medicine

## 2018-02-06 DIAGNOSIS — K529 Noninfective gastroenteritis and colitis, unspecified: Secondary | ICD-10-CM | POA: Diagnosis not present

## 2018-02-06 DIAGNOSIS — K5790 Diverticulosis of intestine, part unspecified, without perforation or abscess without bleeding: Secondary | ICD-10-CM | POA: Diagnosis not present

## 2018-02-06 DIAGNOSIS — F0390 Unspecified dementia without behavioral disturbance: Secondary | ICD-10-CM | POA: Diagnosis not present

## 2018-02-06 DIAGNOSIS — E860 Dehydration: Secondary | ICD-10-CM | POA: Diagnosis not present

## 2018-02-06 LAB — COMPREHENSIVE METABOLIC PANEL
ALT: 117 U/L — ABNORMAL HIGH (ref 0–44)
AST: 65 U/L — ABNORMAL HIGH (ref 15–41)
Albumin: 3.3 g/dL — ABNORMAL LOW (ref 3.5–5.0)
Alkaline Phosphatase: 62 U/L (ref 38–126)
Anion gap: 7 (ref 5–15)
BUN: 18 mg/dL (ref 8–23)
CHLORIDE: 107 mmol/L (ref 98–111)
CO2: 24 mmol/L (ref 22–32)
Calcium: 8.7 mg/dL — ABNORMAL LOW (ref 8.9–10.3)
Creatinine, Ser: 0.98 mg/dL (ref 0.44–1.00)
GFR calc Af Amer: 56 mL/min — ABNORMAL LOW (ref >60)
GFR, EST NON AFRICAN AMERICAN: 48 mL/min — AB (ref >60)
Glucose, Bld: 90 mg/dL (ref 70–99)
Potassium: 3.3 mmol/L — ABNORMAL LOW (ref 3.5–5.1)
Sodium: 138 mmol/L (ref 135–145)
Total Bilirubin: 1 mg/dL (ref 0.3–1.2)
Total Protein: 6.7 g/dL (ref 6.5–8.1)

## 2018-02-06 LAB — CBC WITH DIFFERENTIAL/PLATELET
Abs Immature Granulocytes: 0.04 10*3/uL (ref 0.00–0.07)
Basophils Absolute: 0 10*3/uL (ref 0.0–0.1)
Basophils Relative: 0 %
Eosinophils Absolute: 0.3 10*3/uL (ref 0.0–0.5)
Eosinophils Relative: 3 %
HEMATOCRIT: 29.8 % — AB (ref 36.0–46.0)
Hemoglobin: 9.4 g/dL — ABNORMAL LOW (ref 12.0–15.0)
Immature Granulocytes: 0 %
LYMPHS ABS: 1.6 10*3/uL (ref 0.7–4.0)
Lymphocytes Relative: 17 %
MCH: 30.9 pg (ref 26.0–34.0)
MCHC: 31.5 g/dL (ref 30.0–36.0)
MCV: 98 fL (ref 80.0–100.0)
Monocytes Absolute: 0.8 10*3/uL (ref 0.1–1.0)
Monocytes Relative: 8 %
Neutro Abs: 6.8 10*3/uL (ref 1.7–7.7)
Neutrophils Relative %: 72 %
Platelets: 179 10*3/uL (ref 150–400)
RBC: 3.04 MIL/uL — ABNORMAL LOW (ref 3.87–5.11)
RDW: 13.8 % (ref 11.5–15.5)
WBC: 9.5 10*3/uL (ref 4.0–10.5)
nRBC: 0 % (ref 0.0–0.2)

## 2018-02-06 LAB — MAGNESIUM: Magnesium: 1.7 mg/dL (ref 1.7–2.4)

## 2018-02-06 LAB — TROPONIN I
Troponin I: 0.09 ng/mL (ref <0.03)
Troponin I: 0.07 ng/mL (ref <0.03)

## 2018-02-06 MED ORDER — ALPRAZOLAM 0.5 MG PO TABS: 0.5000 mg | ORAL_TABLET | Freq: Every evening | ORAL | 0 refills | Status: DC | PRN

## 2018-02-06 MED ORDER — POTASSIUM CHLORIDE 10 MEQ/100ML IV SOLN
10.0000 meq | INTRAVENOUS | Status: AC
  Administered 2018-02-06 (×3): 10 meq via INTRAVENOUS
  Filled 2018-02-06 (×2): qty 100

## 2018-02-06 NOTE — Care Management Note (Signed)
Case Management Note  Patient Details  Name: Deborah Farrell MRN: [id number] Date of Birth: 19-Nov-1917  Subjective/Objective:       Observation. From [facility]. PT recommends HH PT. Facility given [employer] provider list and chosen [facility]. Aware HH has 48 hrs to make first visit.              Action/Plan: CSW has made arrangements for return to facility. Tresea Mall, [facility] rep, given referral and will pull pt info.   Expected Discharge Date:  02/06/18               Expected Discharge Plan:  Assisted Living / Rest Home  In-House Referral:  Clinical Social Work  Discharge planning Services  CM Consult  Post Acute Care Choice:  Home Health Choice offered to:  (facility preference)  HH Arranged:  PT HH Agency:  [place]  Status of Service:  Completed, signed off  If discussed at [place] of Stay Meetings, dates discussed:    Additional Comments:  Sherald Barge, RN 02/06/2018, 1:21 PM

## 2018-02-06 NOTE — Evaluation (Signed)
Physical Therapy Evaluation Patient Details Name: Deborah Farrell MRN: [id number] DOB: [birth year] Today's Date: 02/06/2018   History of Present Illness  Deborah Farrell is a 85 y.o. female who presented to the ED from nursing home with complaints of 3 days of generalized weakness and diarrhea.  The patient reports that 3 residents at the facility have been sick with diarrheal illnesses.  She has had a low-grade fever.  She has had poor oral intake.  She has had difficulty with ambulation due to weakness.  She denies shortness of breath and chest pain.  She denies dysuria.  Complaints involve nausea and vomiting that has resolved in the last 24 hours.  She has been increasingly weak which prompted her referral to the emergency department.  She denies abdominal pain at this time.    Clinical Impression  Patient functioning near baseline for functional mobility and gait, slightly labored slow movement for sitting up, once feet able to ambulate around nursing station without loss of balance with occasional hands on assist mostly to keep depends from sliding down legs.  Patient tolerated sitting up in chair after therapy.  Patient to be discharged back to ALF today and discharged from physical therapy to care of nursing for ambulation daily as tolerated for length of stay.       Follow Up Recommendations Home health PT;Supervision for mobility/OOB;Supervision - Intermittent    Equipment Recommendations  None recommended by PT    Recommendations for Other Services       Precautions / Restrictions Restrictions Weight Bearing Restrictions: No      Mobility  Bed Mobility Overal bed mobility: Needs Assistance Bed Mobility: Supine to Sit     Supine to sit: Min guard     General bed mobility comments: increased time, slightly labored movement  Transfers Overall transfer level: Needs assistance Equipment used: Rolling walker (2 wheeled) Transfers: Sit to/from [employer] Sit to Stand: Min guard Stand pivot transfers: Min guard       General transfer comment: increased time  Ambulation/Gait Ambulation/Gait assistance: Min guard;Supervision Gait Distance (Feet): 100 Feet Assistive device: Rolling walker (2 wheeled) Gait Pattern/deviations: Decreased step length - right;Decreased step length - left;Decreased stride length Gait velocity: near normal   General Gait Details: slightly labored cadence without loss of balance  Stairs            Wheelchair Mobility    Modified Rankin (Stroke Patients Only)       Balance Overall balance assessment: Needs assistance Sitting-balance support: Feet supported;No upper extremity supported Sitting balance-Leahy Scale: Good     Standing balance support: During functional activity;Bilateral upper extremity supported Standing balance-Leahy Scale: Fair Standing balance comment: using RW                             Pertinent Vitals/Pain Pain Assessment: No/denies pain    Home Living Family/patient expects to be discharged to:: Assisted living               Home Equipment: Walker - 4 wheels      Prior Function Level of Independence: Needs assistance   Gait / Transfers Assistance Needed: Supervised household ambulation with Rollator  ADL's / Homemaking Assistance Needed: assisted by ALF staff        Hand Dominance        Extremity/Trunk Assessment   Upper Extremity Assessment Upper Extremity Assessment: Overall WFL for tasks assessed    Lower  Extremity Assessment Lower Extremity Assessment: Generalized weakness    Cervical / Trunk Assessment Cervical / Trunk Assessment: Normal  Communication   Communication: HOH  Cognition Arousal/Alertness: Awake/alert Behavior During Therapy: WFL for tasks assessed/performed Overall Cognitive Status: Within Functional Limits for tasks assessed                                        General  Comments      Exercises     Assessment/Plan    PT Assessment All further PT needs can be met in the next venue of care  PT Problem List Decreased strength;Decreased activity tolerance;Decreased balance;Decreased mobility       PT Treatment Interventions      PT Goals (Current goals can be found in the Care Plan section)  Acute Rehab PT Goals Patient Stated Goal: return home PT Goal Formulation: With patient Time For Goal Achievement: 02/06/18 Potential to Achieve Goals: Good    Frequency     Barriers to discharge        Co-evaluation               AM-PAC PT "6 Clicks" Mobility  Outcome Measure Help needed turning from your back to your side while in a flat bed without using bedrails?: None Help needed moving from lying on your back to sitting on the side of a flat bed without using bedrails?: A Little Help needed moving to and from a bed to a chair (including a wheelchair)?: A Little Help needed standing up from a chair using your arms (e.g., wheelchair or bedside chair)?: A Little Help needed to walk in hospital room?: A Little Help needed climbing 3-5 steps with a railing? : A Little 6 Click Score: 19    End of Session   Activity Tolerance: Patient tolerated treatment well;Patient limited by fatigue Patient left: in chair;with call bell/phone within reach Nurse Communication: Mobility status PT Visit Diagnosis: Unsteadiness on feet (R26.81);Other abnormalities of gait and mobility (R26.89);Muscle weakness (generalized) (M62.81)    Time: 1100-3496 PT Time Calculation (min) (ACUTE ONLY): 26 min   Charges:   PT Evaluation $PT Eval Moderate Complexity: 1 Mod PT Treatments $Gait Training: 8-22 mins        12:16 PM, 02/06/18 Lonell Grandchild, MPT Physical Therapist with [facility] [phone] office [phone] mobile phone

## 2018-02-06 NOTE — Progress Notes (Signed)
RN paged Dr. Myna Hidalgo to make him aware patient's Troponin is down from 0.10 to 0.9, awaiting response.  P.J. Linus Mako, RN

## 2018-02-06 NOTE — Clinical Social Work Note (Signed)
Patient is from [facility]. She has been in observation for less than 24 hours and is currently discharged. Facility notified of discharge. Discharge summary faxed to facility. LCSW contacted niece, Izora Gala, and advised of discharge. Niece to pick patient up around 1 p.m. to transport back to the facility.   LCSW signing off.     Waverly Tarquinio, Clydene Pugh, LCSW

## 2018-02-06 NOTE — Progress Notes (Signed)
Discharge instructions given to son w/ med changes and followup appointments. IV discontinued. Pt alert and oriented. All questions answered. Pt transported to [facility] via car w/ son.

## 2018-02-06 NOTE — Discharge Summary (Addendum)
Physician Discharge Summary  Deborah Farrell QMG:[id number] DOB: 1917/02/25 DOA: 02/05/2018  PCP: Celene Squibb, MD  Admit date: 02/05/2018 Discharge date: 02/06/2018  Admitted From: ALF Disposition: ALF [facility])  Recommendations for Outpatient Follow-up:  1. Follow up with PCP in 1 weeks  Discharge Condition: STABLE   CODE STATUS: DNR    Home health: PT   Brief Hospitalization Summary: Please see all hospital notes, images, labs for full details of the hospitalization.  HPI: Deborah Farrell is a 85 y.o. female who presented to the ED from nursing home with complaints of 3 days of generalized weakness and diarrhea.  The patient reports that 3 residents at the facility have been sick with diarrheal illnesses.  She has had a low-grade fever.  She has had poor oral intake.  She has had difficulty with ambulation due to weakness.  She denies shortness of breath and chest pain.  She denies dysuria.  Complaints involve nausea and vomiting that has resolved in the last 24 hours.  She has been increasingly weak which prompted her referral to the emergency department.  She denies abdominal pain at this time.  ED course: The patient has been monitored in the emergency department for several hours.  The patient was noted to have a low-grade temperature of 99.6.  The patient was noted to be clinically dehydrated.  Blood pressure 111/93.  Heart rate 84.  The patient had a CT of the abdomen but there were no acute findings other than fluid levels consistent with diarrheal illness.  The patient has been started on IV fluid hydration.  Labs reveal a WBC of 10.1.  Creatinine mildly elevated at 1.03 with BUN 24.  Troponin was mildly elevated at 0.09 and repeat test was 0.11.  The patient's liver enzymes were mildly elevated with an ALT of 196 and AST 165.  The patient is being admitted for observation and supportive therapy.  The patient has not had any diarrhea since arriving in the ED today.  The patient  says that she is hungry and would like to try eating now.  Hospital Course: The patient was admitted into the hospital for observation and started on supportive therapy with IV fluid hydration.  C. difficile testing was ordered and GI pathogen panel testing was ordered however the patient did not have further diarrhea during her hospital stay.  She had one formed bowel movement.  C. difficile testing cannot be done with formed stool.  The patient did very well with supportive therapy.  The patient had a mildly elevated troponin and it was discussed with cardiology who had recommended serial troponin testing which revealed that troponin is trending down.  The patient has no symptoms of chest pain or shortness of breath.  We suspect that she had a mild demand ischemia associated with her acute illness.  Now that her troponin is trending down she can safely discharge to her ALF.  She was eating and drinking very well.  She did have a mild elevation of her liver enzymes on admission however repeat testing this morning shows that her liver enzymes are trending down.  There is a possibility that she passed a gallstone.  She is having no difficulty eating or drinking at this time.  She has no abdominal pain.  Her vital signs remained stable.  She had a mild hypokalemia that was replenished with IV potassium.  She is stable to return to assisted living facility.  Follow-up with PCP within a week.  Return  for any recurrent symptoms.  Patient is stable for discharge.  Discharge Diagnoses:  Principal Problem:   Acute gastroenteritis Active Problems:   Hyperlipidemia   Unspecified glaucoma   Essential hypertension   Gastroesophageal reflux disease   Irritable bowel syndrome   Diarrhea   Poor balance   Dehydration   Elevated liver enzymes   Low grade fever   Dementia (HCC)   Diverticulosis   Generalized weakness  Discharge Instructions: Discharge Instructions    Increase activity slowly   Complete by:  As  directed      Allergies as of 02/06/2018      Reactions   Tramadol Anaphylaxis   Aciphex [rabeprazole] Other (See Comments)   intolerance   Colchicine    Dicyclomine Hcl Other (See Comments)   Unknown   Hyoscyamine Sulfate Other (See Comments)   Unknown   Iodinated Diagnostic Agents    Nexium [esomeprazole Magnesium] Other (See Comments)   G.I. Upset   Omeprazole Other (See Comments)   GI upset   Sulfonamide Derivatives Other (See Comments)   Unknown      Medication List    TAKE these medications   ALIGN PO Take 1 tablet by mouth daily.   ALPRAZolam 0.5 MG tablet Commonly known as:  XANAX Take 1 tablet (0.5 mg total) by mouth at bedtime as needed for anxiety.   amLODipine 5 MG tablet Commonly known as:  NORVASC Take 5 mg by mouth daily.   carvedilol 12.5 MG tablet Commonly known as:  COREG Take 6.25 mg ( 1/2 tablet in the am) and 12. 5 mg (whole tablet) in the pm   CENTRUM SILVER PO Take 1 tablet by mouth daily.   FUSION PLUS PO Take 1 capsule by mouth daily.   latanoprost 0.005 % ophthalmic solution Commonly known as:  XALATAN Place 1 drop into both eyes at bedtime.   memantine 5 MG tablet Commonly known as:  NAMENDA Take 5 mg by mouth 2 (two) times daily.   MYRBETRIQ 25 MG Tb24 tablet Generic drug:  mirabegron ER Take 25 mg by mouth at bedtime.   nystatin cream Commonly known as:  MYCOSTATIN Apply 1 application topically 2 (two) times daily.   olmesartan 20 MG tablet Commonly known as:  BENICAR Take 20 mg by mouth daily.   pantoprazole 40 MG tablet Commonly known as:  PROTONIX TAKE 1 TABLET BY MOUTH DAILY   prochlorperazine 5 MG tablet Commonly known as:  COMPAZINE TAKE 1 TABLET BY MOUTH EVERY 6 HOURS AS NEEDED FOR NAUSEA OR VOMITING      Follow-up Information    Celene Squibb, MD. Schedule an appointment as soon as possible for a visit in 1 week(s).   Specialty:  Internal Medicine Why:  Hospital Follow Up  Contact information: [place] [facility] [address] [phone]        Arnoldo Lenis, MD. Schedule an appointment as soon as possible for a visit in 2 week(s).   Specialty:  Cardiology Contact information: [place] [address] [phone]          Allergies  Allergen Reactions  . Tramadol Anaphylaxis  . Aciphex [Rabeprazole] Other (See Comments)    intolerance  . Colchicine   . Dicyclomine Hcl Other (See Comments)    Unknown  . Hyoscyamine Sulfate Other (See Comments)    Unknown  . Iodinated Diagnostic Agents   . Nexium [Esomeprazole Magnesium] Other (See Comments)    G.I. Upset  .  Omeprazole Other (See Comments)    GI upset  . Sulfonamide Derivatives Other (See Comments)    Unknown   Allergies as of 02/06/2018      Reactions   Tramadol Anaphylaxis   Aciphex [rabeprazole] Other (See Comments)   intolerance   Colchicine    Dicyclomine Hcl Other (See Comments)   Unknown   Hyoscyamine Sulfate Other (See Comments)   Unknown   Iodinated Diagnostic Agents    Nexium [esomeprazole Magnesium] Other (See Comments)   G.I. Upset   Omeprazole Other (See Comments)   GI upset   Sulfonamide Derivatives Other (See Comments)   Unknown      Medication List    TAKE these medications   ALIGN PO Take 1 tablet by mouth daily.   ALPRAZolam 0.5 MG tablet Commonly known as:  XANAX Take 1 tablet (0.5 mg total) by mouth at bedtime as needed for anxiety.   amLODipine 5 MG tablet Commonly known as:  NORVASC Take 5 mg by mouth daily.   carvedilol 12.5 MG tablet Commonly known as:  COREG Take 6.25 mg ( 1/2 tablet in the am) and 12. 5 mg (whole tablet) in the pm   CENTRUM SILVER PO Take 1 tablet by mouth daily.   FUSION PLUS PO Take 1 capsule by mouth daily.   latanoprost 0.005 % ophthalmic solution Commonly known as:  XALATAN Place 1 drop into both eyes at bedtime.   memantine 5 MG tablet Commonly known as:  NAMENDA Take 5 mg by mouth 2 (two) times  daily.   MYRBETRIQ 25 MG Tb24 tablet Generic drug:  mirabegron ER Take 25 mg by mouth at bedtime.   nystatin cream Commonly known as:  MYCOSTATIN Apply 1 application topically 2 (two) times daily.   olmesartan 20 MG tablet Commonly known as:  BENICAR Take 20 mg by mouth daily.   pantoprazole 40 MG tablet Commonly known as:  PROTONIX TAKE 1 TABLET BY MOUTH DAILY   prochlorperazine 5 MG tablet Commonly known as:  COMPAZINE TAKE 1 TABLET BY MOUTH EVERY 6 HOURS AS NEEDED FOR NAUSEA OR VOMITING       Procedures/Studies: Ct Abdomen Pelvis Wo Contrast  Result Date: 02/05/2018 CLINICAL DATA:  Diarrhea for 3 days. EXAM: CT ABDOMEN AND PELVIS WITHOUT CONTRAST TECHNIQUE: Multidetector CT imaging of the abdomen and pelvis was performed following the standard protocol without IV contrast. COMPARISON:  CT abdomen and pelvis 12/25/2007. FINDINGS: Lower chest: There is mild dependent atelectasis in the lung bases. Heart size is upper normal. Calcific coronary artery calcifications are identified. No pleural or pericardial effusion. Hepatobiliary: No focal liver abnormality is seen. Status post cholecystectomy. No biliary dilatation. Pancreas: Unremarkable. No pancreatic ductal dilatation or surrounding inflammatory changes. Spleen: Normal in size without focal abnormality. Adrenals/Urinary Tract: Adrenal glands are unremarkable. Kidneys are normal, without renal calculi, solid lesion, or hydronephrosis. Small left renal cyst is incidentally noted. Bladder is unremarkable. Stomach/Bowel: Scattered fluid fluid levels are seen in the colon consistent with the patient's history of diarrhea. The colon is otherwise normal appearance without evidence inflammatory change or mass. The stomach and small bowel appear normal. Vascular/Lymphatic: Aortic atherosclerosis. No enlarged abdominal or pelvic lymph nodes. Reproductive: Status post hysterectomy. No adnexal masses. Other: None. Musculoskeletal: No lytic or  sclerotic lesion. Convex left scoliosis and multilevel lumbar spondylosis noted. Mild-to-moderate degenerative change is also seen about the hips. IMPRESSION: Scattered fluid fluid levels in the colon consistent with the patient's history of diarrhea. Cause for this finding is not identified.  The bowel otherwise appears normal. Calcific aortic and coronary atherosclerosis. Electronically Signed   By: Inge Rise M.D.   On: 02/05/2018 12:36   Dg Chest 2 View  Result Date: 02/05/2018 CLINICAL DATA:  Weakness and diarrhea for 3 days. EXAM: CHEST - 2 VIEW COMPARISON:  12/05/2009 FINDINGS: Mildly low lung volumes with probable atelectatic opacity on the lateral view. There is no edema, consolidation, effusion, or pneumothorax. Stable likely normal heart size. Negative aortic and hilar contours. IMPRESSION: No evidence of acute disease. Electronically Signed   By: Monte Fantasia M.D.   On: 02/05/2018 06:27      Subjective: Pt eating and drinking well and diarrhea has resolved.  Pt had 1 formed stool.   Discharge Exam: Vitals:   02/05/18 2103 02/06/18 0518  BP:  (!) 118/54  Pulse:  66  Resp:  18  Temp:  98.4 F (36.9 C)  SpO2: 99% 97%   Vitals:   02/05/18 0830 02/05/18 2005 02/05/18 2103 02/06/18 0518  BP: (!) 111/93 (!) 135/57  (!) 118/54  Pulse:  68  66  Resp: (!) 27 20  18   Temp:  98.7 F (37.1 C)  98.4 F (36.9 C)  TempSrc:  Oral  Oral  SpO2:  100% 99% 97%  Weight:  63.2 kg  63.5 kg  Height:  5\' 4"  (1.626 m)      General exam: Moderately built and elderly, well nourished patient, lying comfortably supine on the gurney in no obvious distress.  The patient appears younger than stated age.  Head, eyes and ENT: Nontraumatic and normocephalic. Pupils equally reacting to light and accommodation. Oral mucosa Moist.   Neck: Supple. No JVD, carotid bruit or thyromegaly.  Lymphatics: No lymphadenopathy.  Respiratory system: Clear to auscultation. No increased work of  breathing.  Cardiovascular system: S1 and S2 heard, RRR. No JVD, murmurs, gallops, clicks or pedal edema.  Gastrointestinal system: Abdomen is nondistended, soft and nontender. Normal bowel sounds heard. No organomegaly or masses appreciated.  Central nervous system: Alert and oriented. No focal neurological deficits.  Extremities: Symmetric 5 x 5 power. Peripheral pulses symmetrically felt.   Skin: No rashes or acute findings.  Musculoskeletal system: Negative exam.  Psychiatry: Pleasant and cooperative.   The results of significant diagnostics from this hospitalization (including imaging, microbiology, ancillary and laboratory) are listed below for reference.     Microbiology: Recent Results (from the past 240 hour(s))  MRSA PCR Screening     Status: None   Collection Time: 02/05/18  9:15 PM  Result Value Ref Range Status   MRSA by PCR NEGATIVE NEGATIVE Final    Comment:        The GeneXpert MRSA Assay (FDA approved for NASAL specimens only), is one component of a comprehensive MRSA colonization surveillance program. It is not intended to diagnose MRSA infection nor to guide or monitor treatment for MRSA infections. Performed at [facility], [address] [place], [place] [address]      Labs: BNP (last 3 results) No results for input(s): BNP in the last 8760 hours. Basic Metabolic Panel: Recent Labs  Lab 02/05/18 0647 02/06/18 0242  NA 139 138  K 3.5 3.3*  CL 105 107  CO2 22 24  GLUCOSE 95 90  BUN 24* 18  CREATININE 1.03* 0.98  CALCIUM 9.3 8.7*  MG  --  1.7   Liver Function Tests: Recent Labs  Lab 02/05/18 0647 02/06/18 0242  AST 165* 65*  ALT 196* 117*  ALKPHOS 80 62  BILITOT 1.3* 1.0  PROT 7.7 6.7  ALBUMIN 4.3 3.3*   No results for input(s): LIPASE, AMYLASE in the last 168 hours. No results for input(s): AMMONIA in the last 168 hours. CBC: Recent Labs  Lab 02/05/18 0647 02/06/18 0242  WBC 10.1 9.5  NEUTROABS 8.0* 6.8  HGB 11.7* 9.4*   HCT 37.5 29.8*  MCV 100.8* 98.0  PLT 140* 179   Cardiac Enzymes: Recent Labs  Lab 02/05/18 0647 02/05/18 0952 02/05/18 2100 02/06/18 0242  TROPONINI 0.09* 0.11* 0.10* 0.09*   BNP: Invalid input(s): POCBNP CBG: No results for input(s): GLUCAP in the last 168 hours. D-Dimer No results for input(s): DDIMER in the last 72 hours. Hgb A1c No results for input(s): HGBA1C in the last 72 hours. Lipid Profile No results for input(s): CHOL, HDL, LDLCALC, TRIG, CHOLHDL, LDLDIRECT in the last 72 hours. Thyroid function studies Recent Labs    02/05/18 2100  TSH 2.038   Anemia work up No results for input(s): VITAMINB12, FOLATE, FERRITIN, TIBC, IRON, RETICCTPCT in the last 72 hours. Urinalysis    Component Value Date/Time   COLORURINE YELLOW 02/05/2018 0515   APPEARANCEUR CLEAR 02/05/2018 0515   LABSPEC 1.009 02/05/2018 0515   PHURINE 6.0 02/05/2018 0515   GLUCOSEU NEGATIVE 02/05/2018 0515   HGBUR NEGATIVE 02/05/2018 0515   BILIRUBINUR NEGATIVE 02/05/2018 0515   KETONESUR NEGATIVE 02/05/2018 0515   PROTEINUR NEGATIVE 02/05/2018 0515   UROBILINOGEN 1.0 11/02/2011 2208   NITRITE NEGATIVE 02/05/2018 0515   LEUKOCYTESUR TRACE (A) 02/05/2018 0515   Sepsis Labs Invalid input(s): PROCALCITONIN,  WBC,  LACTICIDVEN Microbiology Recent Results (from the past 240 hour(s))  MRSA PCR Screening     Status: None   Collection Time: 02/05/18  9:15 PM  Result Value Ref Range Status   MRSA by PCR NEGATIVE NEGATIVE Final    Comment:        The GeneXpert MRSA Assay (FDA approved for NASAL specimens only), is one component of a comprehensive MRSA colonization surveillance program. It is not intended to diagnose MRSA infection nor to guide or monitor treatment for MRSA infections. Performed at [facility], [address], [place], [place] [address]    Time coordinating discharge:   SIGNED:  Irwin Brakeman, MD

## 2018-02-06 NOTE — Discharge Instructions (Signed)
Seek medical care or return to ER if symptoms come back, worsen or new problems develop.   Dehydration, Adult  Dehydration is when there is not enough fluid or water in your body. This happens when you lose more fluids than you take in. Dehydration can range from mild to very bad. It should be treated right away to keep it from getting very bad. Symptoms of mild dehydration may include:  Thirst.  Dry lips.  Slightly dry mouth.  Dry, warm skin.  Dizziness. Symptoms of moderate dehydration may include:  Very dry mouth.  Muscle cramps.  Dark pee (urine). Pee may be the color of tea.  Your body making less pee.  Your eyes making fewer tears.  Heartbeat that is uneven or faster than normal (palpitations).  Headache.  Light-headedness, especially when you stand up from sitting.  Fainting (syncope). Symptoms of very bad dehydration may include:  Changes in skin, such as: ? Cold and clammy skin. ? Blotchy (mottled) or pale skin. ? Skin that does not quickly return to normal after being lightly pinched and let go (poor skin turgor).  Changes in body fluids, such as: ? Feeling very thirsty. ? Your eyes making fewer tears. ? Not sweating when body temperature is high, such as in hot weather. ? Your body making very little pee.  Changes in vital signs, such as: ? Weak pulse. ? Pulse that is more than 100 beats a minute when you are sitting still. ? Fast breathing. ? Low blood pressure.  Other changes, such as: ? Sunken eyes. ? Cold hands and feet. ? Confusion. ? Lack of energy (lethargy). ? Trouble waking up from sleep. ? Short-term weight loss. ? Unconsciousness. Follow these instructions at home:   If told by your doctor, drink an ORS: ? Make an ORS by using instructions on the package. ? Start by drinking small amounts, about  cup (120 mL) every 5-10 minutes. ? Slowly drink more until you have had the amount that your doctor said to have.  Drink enough  clear fluid to keep your pee clear or pale yellow. If you were told to drink an ORS, finish the ORS first, then start slowly drinking clear fluids. Drink fluids such as: ? Water. Do not drink only water by itself. Doing that can make the salt (sodium) level in your body get too low (hyponatremia). ? Ice chips. ? Fruit juice that you have added water to (diluted). ? Low-calorie sports drinks.  Avoid: ? Alcohol. ? Drinks that have a lot of sugar. These include high-calorie sports drinks, fruit juice that does not have water added, and soda. ? Caffeine. ? Foods that are greasy or have a lot of fat or sugar.  Take over-the-counter and prescription medicines only as told by your doctor.  Do not take salt tablets. Doing that can make the salt level in your body get too high (hypernatremia).  Eat foods that have minerals (electrolytes). Examples include bananas, oranges, potatoes, tomatoes, and spinach.  Keep all follow-up visits as told by your doctor. This is important. Contact a doctor if:  You have belly (abdominal) pain that: ? Gets worse. ? Stays in one area (localizes).  You have a rash.  You have a stiff neck.  You get angry or annoyed more easily than normal (irritability).  You are more sleepy than normal.  You have a harder time waking up than normal.  You feel: ? Weak. ? Dizzy. ? Very thirsty.  You have peed (urinated) only  a small amount of very dark pee during 6-8 hours. Get help right away if:  You have symptoms of very bad dehydration.  You cannot drink fluids without throwing up (vomiting).  Your symptoms get worse with treatment.  You have a fever.  You have a very bad headache.  You are throwing up or having watery poop (diarrhea) and it: ? Gets worse. ? Does not go away.  You have blood or something green (bile) in your throw-up.  You have blood in your poop (stool). This may cause poop to look black and tarry.  You have not peed in 6-8  hours.  You pass out (faint).  Your heart rate when you are sitting still is more than 100 beats a minute.  You have trouble breathing. This information is not intended to replace advice given to you by your health care provider. Make sure you discuss any questions you have with your health care provider. Document Released: 10/27/2008 Document Revised: 07/21/2015 Document Reviewed: 02/24/2015 Elsevier Interactive Patient Education  2019 [employer]  Viral Gastroenteritis, Adult  Viral gastroenteritis is also known as the stomach flu. This condition is caused by certain germs (viruses). These germs can be passed from person to person very easily (are very contagious). This condition can cause sudden watery poop (diarrhea), fever, and throwing up (vomiting). Having watery poop and throwing up can make you feel weak and cause you to get dehydrated. Dehydration can make you tired and thirsty, make you have a dry mouth, and make it so you pee (urinate) less often. Older adults and people with other diseases or a weak defense system (immune system) are at higher risk for dehydration. It is important to replace the fluids that you lose from having watery poop and throwing up. Follow these instructions at home: Follow instructions from your doctor about how to care for yourself at home. Eating and drinking Follow these instructions as told by your doctor:  Take an oral rehydration solution (ORS). This is a drink that is sold at pharmacies and stores.  Drink clear fluids in small amounts as you are able, such as: ? Water. ? Ice chips. ? Diluted fruit juice. ? Low-calorie sports drinks.  Eat bland, easy-to-digest foods in small amounts as you are able, such as: ? Bananas. ? Applesauce. ? Rice. ? Low-fat (lean) meats. ? Toast. ? Crackers.  Avoid fluids that have a lot of sugar or caffeine in them.  Avoid alcohol.  Avoid spicy or fatty foods. General instructions   Drink enough  fluid to keep your pee (urine) clear or pale yellow.  Wash your hands often. If you cannot use soap and water, use hand sanitizer.  Make sure that all people in your home wash their hands well and often.  Rest at home while you get better.  Take over-the-counter and prescription medicines only as told by your doctor.  Watch your condition for any changes.  Take a warm bath to help with any burning or pain from having watery poop.  Keep all follow-up visits as told by your doctor. This is important. Contact a doctor if:  You cannot keep fluids down.  Your symptoms get worse.  You have new symptoms.  You feel light-headed or dizzy.  You have muscle cramps. Get help right away if:  You have chest pain.  You feel very weak or you pass out (faint).  You see blood in your throw-up.  Your throw-up looks like coffee grounds.  You have bloody  or black poop (stools) or poop that look like tar.  You have a very bad headache, a stiff neck, or both.  You have a rash.  You have very bad pain, cramping, or bloating in your belly (abdomen).  You have trouble breathing.  You are breathing very quickly.  Your heart is beating very quickly.  Your skin feels cold and clammy.  You feel confused.  You have pain when you pee.  You have signs of dehydration, such as: ? Dark pee, hardly any pee, or no pee. ? Cracked lips. ? Dry mouth. ? Sunken eyes. ? Sleepiness. ? Weakness. This information is not intended to replace advice given to you by your health care provider. Make sure you discuss any questions you have with your health care provider. Document Released: 06/19/2007 Document Revised: 09/24/2017 Document Reviewed: 09/06/2014 Elsevier Interactive Patient Education  2019 [employer].

## 2018-02-07 LAB — VITAMIN D 25 HYDROXY (VIT D DEFICIENCY, FRACTURES): Vit D, 25-Hydroxy: 28.9 ng/mL — ABNORMAL LOW (ref 30.0–100.0)

## 2018-02-10 DIAGNOSIS — K5780 Diverticulitis of intestine, part unspecified, with perforation and abscess without bleeding: Secondary | ICD-10-CM | POA: Diagnosis not present

## 2018-02-10 DIAGNOSIS — B351 Tinea unguium: Secondary | ICD-10-CM | POA: Diagnosis not present

## 2018-02-10 DIAGNOSIS — Z9049 Acquired absence of other specified parts of digestive tract: Secondary | ICD-10-CM | POA: Diagnosis not present

## 2018-02-10 DIAGNOSIS — Z90710 Acquired absence of both cervix and uterus: Secondary | ICD-10-CM | POA: Diagnosis not present

## 2018-02-10 DIAGNOSIS — K5289 Other specified noninfective gastroenteritis and colitis: Secondary | ICD-10-CM | POA: Diagnosis not present

## 2018-02-10 DIAGNOSIS — E785 Hyperlipidemia, unspecified: Secondary | ICD-10-CM | POA: Diagnosis not present

## 2018-02-10 DIAGNOSIS — M79675 Pain in left toe(s): Secondary | ICD-10-CM | POA: Diagnosis not present

## 2018-02-10 DIAGNOSIS — H409 Unspecified glaucoma: Secondary | ICD-10-CM | POA: Diagnosis not present

## 2018-02-10 DIAGNOSIS — K219 Gastro-esophageal reflux disease without esophagitis: Secondary | ICD-10-CM | POA: Diagnosis not present

## 2018-02-10 DIAGNOSIS — M79674 Pain in right toe(s): Secondary | ICD-10-CM | POA: Diagnosis not present

## 2018-02-10 DIAGNOSIS — I10 Essential (primary) hypertension: Secondary | ICD-10-CM | POA: Diagnosis not present

## 2018-02-10 DIAGNOSIS — Z9181 History of falling: Secondary | ICD-10-CM | POA: Diagnosis not present

## 2018-02-13 ENCOUNTER — Encounter: Payer: Self-pay | Admitting: *Deleted

## 2018-02-13 ENCOUNTER — Encounter: Payer: Self-pay | Admitting: Cardiology

## 2018-02-13 ENCOUNTER — Ambulatory Visit (INDEPENDENT_AMBULATORY_CARE_PROVIDER_SITE_OTHER): Payer: Medicare Other | Admitting: Cardiology

## 2018-02-13 VITALS — BP 124/56 | HR 80 | Ht 64.0 in | Wt 143.0 lb

## 2018-02-13 DIAGNOSIS — R002 Palpitations: Secondary | ICD-10-CM

## 2018-02-13 DIAGNOSIS — Z9181 History of falling: Secondary | ICD-10-CM | POA: Diagnosis not present

## 2018-02-13 DIAGNOSIS — R600 Localized edema: Secondary | ICD-10-CM

## 2018-02-13 DIAGNOSIS — E785 Hyperlipidemia, unspecified: Secondary | ICD-10-CM | POA: Diagnosis not present

## 2018-02-13 DIAGNOSIS — I10 Essential (primary) hypertension: Secondary | ICD-10-CM

## 2018-02-13 DIAGNOSIS — Z9049 Acquired absence of other specified parts of digestive tract: Secondary | ICD-10-CM | POA: Diagnosis not present

## 2018-02-13 DIAGNOSIS — K5289 Other specified noninfective gastroenteritis and colitis: Secondary | ICD-10-CM | POA: Diagnosis not present

## 2018-02-13 DIAGNOSIS — K219 Gastro-esophageal reflux disease without esophagitis: Secondary | ICD-10-CM | POA: Diagnosis not present

## 2018-02-13 DIAGNOSIS — H409 Unspecified glaucoma: Secondary | ICD-10-CM | POA: Diagnosis not present

## 2018-02-13 DIAGNOSIS — K5780 Diverticulitis of intestine, part unspecified, with perforation and abscess without bleeding: Secondary | ICD-10-CM | POA: Diagnosis not present

## 2018-02-13 DIAGNOSIS — Z90710 Acquired absence of both cervix and uterus: Secondary | ICD-10-CM | POA: Diagnosis not present

## 2018-02-13 NOTE — Progress Notes (Signed)
Clinical Summary Ms. Pemberton is a 85 y.o.female seen today for follow up of the following medical problems.  1. HTN -she is complaint with meds   2. Palpitations - no recent palpitations.    3. Elevated troponin - mild elevation during recent admission with GI illness. Peak 0.1 and trended down. No cardiopulmonary symptoms, no specific ischemic changes on EKG. Unclear why troponin was checked.    Past Medical History:  Diagnosis Date  . Allergy history, radiographic dye   . Arthritis   . Dysuria   . Glaucoma   . HTN (hypertension)   . IBS (irritable bowel syndrome)    Non-ulcer dyspepsia; hemorrhoids  . Overactive bladder   . 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 Urinary frequency   . Urinary tract infection      Allergies  Allergen Reactions  . Tramadol Anaphylaxis  . Aciphex [Rabeprazole] Other (See Comments)    intolerance  . Colchicine   . Dicyclomine Hcl Other (See Comments)    Unknown  . Hyoscyamine Sulfate Other (See Comments)    Unknown  . Iodinated Diagnostic Agents   . Nexium [Esomeprazole Magnesium] Other (See Comments)    G.I. Upset  . Omeprazole Other (See Comments)    GI upset  . Sulfonamide Derivatives Other (See Comments)    Unknown     Current Outpatient Medications  Medication Sig Dispense Refill  . ALPRAZolam (XANAX) 0.5 MG tablet Take 1 tablet (0.5 mg total) by mouth at bedtime as needed for anxiety. 5 tablet 0  . amLODipine (NORVASC) 5 MG tablet Take 5 mg by mouth daily.    . carvedilol (COREG) 12.5 MG tablet Take 6.25 mg ( 1/2 tablet in the am) and 12. 5 mg (whole tablet) in the pm 135 tablet 3  . Iron-FA-B Cmp-C-Biot-Probiotic (FUSION PLUS PO) Take 1 capsule by mouth daily.    Marland Kitchen latanoprost (XALATAN) 0.005 % ophthalmic solution Place 1 drop into both eyes at bedtime.      . memantine (NAMENDA) 5 MG tablet  Take 5 mg by mouth 2 (two) times daily.    . Multiple Vitamins-Minerals (CENTRUM SILVER PO) Take 1 tablet by mouth daily.    Marland Kitchen MYRBETRIQ 25 MG TB24 tablet Take 25 mg by mouth at bedtime.   6  . nystatin cream (MYCOSTATIN) Apply 1 application topically 2 (two) times daily.    Marland Kitchen olmesartan (BENICAR) 20 MG tablet Take 20 mg by mouth daily.    . pantoprazole (PROTONIX) 40 MG tablet TAKE 1 TABLET BY MOUTH DAILY 90 tablet 1  . Probiotic Product (ALIGN PO) Take 1 tablet by mouth daily.     . prochlorperazine (COMPAZINE) 5 MG tablet TAKE 1 TABLET BY MOUTH EVERY 6 HOURS AS NEEDED FOR NAUSEA OR VOMITING 180 tablet 3   No current facility-administered medications for this visit.      Past Surgical History:  Procedure Laterality Date  . APPENDECTOMY    . CHOLECYSTECTOMY    . COLONOSCOPY  2008, 2011   diverticulitis diagnosed, 12mm sessile polyp removed inascending colon. area tattood for future observation and reccomended f/u colonoscopy in one year  . COLONOSCOPY N/A 04/24/2012   SLF:The colon IS redundant/ Single RECTAL polyp/Moderate diverticulosis/Small internal hemorrhoids  . OVARIAN CYST REMOVAL    . VAGINAL HYSTERECTOMY       Allergies  Allergen Reactions  . Tramadol Anaphylaxis  .  Aciphex [Rabeprazole] Other (See Comments)    intolerance  . Colchicine   . Dicyclomine Hcl Other (See Comments)    Unknown  . Hyoscyamine Sulfate Other (See Comments)    Unknown  . Iodinated Diagnostic Agents   . Nexium [Esomeprazole Magnesium] Other (See Comments)    G.I. Upset  . Omeprazole Other (See Comments)    GI upset  . Sulfonamide Derivatives Other (See Comments)    Unknown      Family History  Problem Relation Age of Onset  . Stroke Mother   . Heart attack Sister   . Heart disease Sister   . Cancer Maternal Grandfather      Social History Ms. Tahir reports that she has never smoked. She has never used smokeless tobacco. Ms. Barhorst reports no history of alcohol use.   Review  of Systems CONSTITUTIONAL: No weight loss, fever, chills, weakness or fatigue.  HEENT: Eyes: No visual loss, blurred vision, double vision or yellow sclerae.No hearing loss, sneezing, congestion, runny nose or sore throat.  SKIN: No rash or itching.  CARDIOVASCULAR: per hpi RESPIRATORY: No shortness of breath, cough or sputum.  GASTROINTESTINAL: No anorexia, nausea, vomiting or diarrhea. No abdominal pain or blood.  GENITOURINARY: No burning on urination, no polyuria NEUROLOGICAL: No headache, dizziness, syncope, paralysis, ataxia, numbness or tingling in the extremities. No change in bowel or bladder control.  MUSCULOSKELETAL: No muscle, back pain, joint pain or stiffness.  LYMPHATICS: No enlarged nodes. No history of splenectomy.  PSYCHIATRIC: No history of depression or anxiety.  ENDOCRINOLOGIC: No reports of sweating, cold or heat intolerance. No polyuria or polydipsia.  Marland Kitchen   Physical Examination Vitals:   02/13/18 1403  BP: (!) 124/56  Pulse: 80  SpO2: 97%   Vitals:   02/13/18 1403  Weight: 143 lb (64.9 kg)  Height: 5\' 4"  (1.626 m)    Gen: resting comfortably, no acute distress HEENT: no scleral icterus, pupils equal round and reactive, no palptable cervical adenopathy,  CV: RRR, no m/r/g, no jvd Resp: Clear to auscultation bilaterally GI: abdomen is soft, non-tender, non-distended, normal bowel sounds, no hepatosplenomegaly MSK: extremities are warm, 2+ bilateral LE edema Skin: warm, no rash Neuro:  no focal deficits Psych: appropriate affect    Assessment and Plan  1. HTN - bp at goal, continue current meds  2. LE edema - suspect related to IVFs due to recent admission with GI illness - no SOB or DOE, no orthopnea - has had prior issues with low bp's on diuretics - given her advanced age, problem with diuretics in the past will just monitor at this time. As her body gets back to balance after recent admission fluid may resolve on its own - f/u 3 weeks, if  ongoing would plan for short course of lasix 20mg .  -at 85 yo without other symptoms do not see strong indication to obtain echo   3. Palpitations - no recent symptoms, we will continue to monitor.        Arnoldo Lenis, M.D.

## 2018-02-13 NOTE — Patient Instructions (Signed)
Medication Instructions:  Your physician recommends that you continue on your current medications as directed. Please refer to the Current Medication list given to you today.  If you need a refill on your cardiac medications before your next appointment, please call your pharmacy.   Lab work: NONE  If you have labs (blood work) drawn today and your tests are completely normal, you will receive your results only by: Marland Kitchen MyChart Message (if you have MyChart) OR . A paper copy in the mail If you have any lab test that is abnormal or we need to change your treatment, we will call you to review the results.  Testing/Procedures: NONE   Follow-Up: At [facility], you and your health needs are our priority.  As part of our continuing mission to provide you with exceptional heart care, we have created designated Provider Care Teams.  These Care Teams include your primary Cardiologist (physician) and Advanced Practice Providers (APPs -  Physician Assistants and Nurse Practitioners) who all work together to provide you with the care you need, when you need it. You will need a follow up appointment in 3 weeks.  Please call our office 2 months in advance to schedule this appointment.  You may see Carlyle Dolly, MD or one of the following Advanced Practice Providers on your designated Care Team:   Bernerd Pho, PA-C [facility]) . Ermalinda Barrios, PA-C ([place])  Any Other Special Instructions Will Be Listed Below (If Applicable). Your physician wants you to follow-up in: 6 Months with Dr. Harl Bowie. You will receive a reminder letter in the mail two months in advance. If you don't receive a letter, please call our office to schedule the follow-up appointment.  Thank you for choosing [facility]!

## 2018-02-16 DIAGNOSIS — E876 Hypokalemia: Secondary | ICD-10-CM | POA: Diagnosis not present

## 2018-02-16 DIAGNOSIS — K219 Gastro-esophageal reflux disease without esophagitis: Secondary | ICD-10-CM | POA: Diagnosis not present

## 2018-02-16 DIAGNOSIS — N3281 Overactive bladder: Secondary | ICD-10-CM | POA: Diagnosis not present

## 2018-02-16 DIAGNOSIS — Z90710 Acquired absence of both cervix and uterus: Secondary | ICD-10-CM | POA: Diagnosis not present

## 2018-02-16 DIAGNOSIS — R740 Nonspecific elevation of levels of transaminase and lactic acid dehydrogenase [LDH]: Secondary | ICD-10-CM | POA: Diagnosis not present

## 2018-02-16 DIAGNOSIS — K5780 Diverticulitis of intestine, part unspecified, with perforation and abscess without bleeding: Secondary | ICD-10-CM | POA: Diagnosis not present

## 2018-02-16 DIAGNOSIS — Z9049 Acquired absence of other specified parts of digestive tract: Secondary | ICD-10-CM | POA: Diagnosis not present

## 2018-02-16 DIAGNOSIS — E785 Hyperlipidemia, unspecified: Secondary | ICD-10-CM | POA: Diagnosis not present

## 2018-02-16 DIAGNOSIS — Z6822 Body mass index (BMI) 22.0-22.9, adult: Secondary | ICD-10-CM | POA: Diagnosis not present

## 2018-02-16 DIAGNOSIS — A084 Viral intestinal infection, unspecified: Secondary | ICD-10-CM | POA: Diagnosis not present

## 2018-02-16 DIAGNOSIS — Z9181 History of falling: Secondary | ICD-10-CM | POA: Diagnosis not present

## 2018-02-16 DIAGNOSIS — I10 Essential (primary) hypertension: Secondary | ICD-10-CM | POA: Diagnosis not present

## 2018-02-16 DIAGNOSIS — D649 Anemia, unspecified: Secondary | ICD-10-CM | POA: Diagnosis not present

## 2018-02-16 DIAGNOSIS — H409 Unspecified glaucoma: Secondary | ICD-10-CM | POA: Diagnosis not present

## 2018-02-16 DIAGNOSIS — K5289 Other specified noninfective gastroenteritis and colitis: Secondary | ICD-10-CM | POA: Diagnosis not present

## 2018-02-16 DIAGNOSIS — D509 Iron deficiency anemia, unspecified: Secondary | ICD-10-CM | POA: Diagnosis not present

## 2018-02-25 DIAGNOSIS — Z9181 History of falling: Secondary | ICD-10-CM | POA: Diagnosis not present

## 2018-02-25 DIAGNOSIS — E785 Hyperlipidemia, unspecified: Secondary | ICD-10-CM | POA: Diagnosis not present

## 2018-02-25 DIAGNOSIS — Z9049 Acquired absence of other specified parts of digestive tract: Secondary | ICD-10-CM | POA: Diagnosis not present

## 2018-02-25 DIAGNOSIS — K5289 Other specified noninfective gastroenteritis and colitis: Secondary | ICD-10-CM | POA: Diagnosis not present

## 2018-02-25 DIAGNOSIS — I10 Essential (primary) hypertension: Secondary | ICD-10-CM | POA: Diagnosis not present

## 2018-02-25 DIAGNOSIS — K5780 Diverticulitis of intestine, part unspecified, with perforation and abscess without bleeding: Secondary | ICD-10-CM | POA: Diagnosis not present

## 2018-02-25 DIAGNOSIS — Z90710 Acquired absence of both cervix and uterus: Secondary | ICD-10-CM | POA: Diagnosis not present

## 2018-02-25 DIAGNOSIS — H409 Unspecified glaucoma: Secondary | ICD-10-CM | POA: Diagnosis not present

## 2018-02-25 DIAGNOSIS — K219 Gastro-esophageal reflux disease without esophagitis: Secondary | ICD-10-CM | POA: Diagnosis not present

## 2018-02-27 DIAGNOSIS — K219 Gastro-esophageal reflux disease without esophagitis: Secondary | ICD-10-CM | POA: Diagnosis not present

## 2018-02-27 DIAGNOSIS — Z9049 Acquired absence of other specified parts of digestive tract: Secondary | ICD-10-CM | POA: Diagnosis not present

## 2018-02-27 DIAGNOSIS — K5780 Diverticulitis of intestine, part unspecified, with perforation and abscess without bleeding: Secondary | ICD-10-CM | POA: Diagnosis not present

## 2018-02-27 DIAGNOSIS — K5289 Other specified noninfective gastroenteritis and colitis: Secondary | ICD-10-CM | POA: Diagnosis not present

## 2018-02-27 DIAGNOSIS — Z9181 History of falling: Secondary | ICD-10-CM | POA: Diagnosis not present

## 2018-02-27 DIAGNOSIS — Z90710 Acquired absence of both cervix and uterus: Secondary | ICD-10-CM | POA: Diagnosis not present

## 2018-02-27 DIAGNOSIS — H409 Unspecified glaucoma: Secondary | ICD-10-CM | POA: Diagnosis not present

## 2018-02-27 DIAGNOSIS — E785 Hyperlipidemia, unspecified: Secondary | ICD-10-CM | POA: Diagnosis not present

## 2018-02-27 DIAGNOSIS — I10 Essential (primary) hypertension: Secondary | ICD-10-CM | POA: Diagnosis not present

## 2018-03-03 DIAGNOSIS — I10 Essential (primary) hypertension: Secondary | ICD-10-CM | POA: Diagnosis not present

## 2018-03-03 DIAGNOSIS — Z9049 Acquired absence of other specified parts of digestive tract: Secondary | ICD-10-CM | POA: Diagnosis not present

## 2018-03-03 DIAGNOSIS — E785 Hyperlipidemia, unspecified: Secondary | ICD-10-CM | POA: Diagnosis not present

## 2018-03-03 DIAGNOSIS — K219 Gastro-esophageal reflux disease without esophagitis: Secondary | ICD-10-CM | POA: Diagnosis not present

## 2018-03-03 DIAGNOSIS — Z90710 Acquired absence of both cervix and uterus: Secondary | ICD-10-CM | POA: Diagnosis not present

## 2018-03-03 DIAGNOSIS — K5289 Other specified noninfective gastroenteritis and colitis: Secondary | ICD-10-CM | POA: Diagnosis not present

## 2018-03-03 DIAGNOSIS — Z9181 History of falling: Secondary | ICD-10-CM | POA: Diagnosis not present

## 2018-03-03 DIAGNOSIS — H409 Unspecified glaucoma: Secondary | ICD-10-CM | POA: Diagnosis not present

## 2018-03-03 DIAGNOSIS — K5780 Diverticulitis of intestine, part unspecified, with perforation and abscess without bleeding: Secondary | ICD-10-CM | POA: Diagnosis not present

## 2018-03-06 ENCOUNTER — Ambulatory Visit: Payer: Medicare Other | Admitting: Student

## 2018-03-06 ENCOUNTER — Encounter: Payer: Self-pay | Admitting: Student

## 2018-03-06 VITALS — BP 104/50 | HR 70 | Ht 64.0 in | Wt 139.0 lb

## 2018-03-06 DIAGNOSIS — R002 Palpitations: Secondary | ICD-10-CM

## 2018-03-06 DIAGNOSIS — K5780 Diverticulitis of intestine, part unspecified, with perforation and abscess without bleeding: Secondary | ICD-10-CM | POA: Diagnosis not present

## 2018-03-06 DIAGNOSIS — R600 Localized edema: Secondary | ICD-10-CM | POA: Diagnosis not present

## 2018-03-06 DIAGNOSIS — I10 Essential (primary) hypertension: Secondary | ICD-10-CM

## 2018-03-06 DIAGNOSIS — K219 Gastro-esophageal reflux disease without esophagitis: Secondary | ICD-10-CM | POA: Diagnosis not present

## 2018-03-06 DIAGNOSIS — R630 Anorexia: Secondary | ICD-10-CM

## 2018-03-06 DIAGNOSIS — E785 Hyperlipidemia, unspecified: Secondary | ICD-10-CM | POA: Diagnosis not present

## 2018-03-06 DIAGNOSIS — K5289 Other specified noninfective gastroenteritis and colitis: Secondary | ICD-10-CM | POA: Diagnosis not present

## 2018-03-06 DIAGNOSIS — Z9049 Acquired absence of other specified parts of digestive tract: Secondary | ICD-10-CM | POA: Diagnosis not present

## 2018-03-06 DIAGNOSIS — Z9181 History of falling: Secondary | ICD-10-CM | POA: Diagnosis not present

## 2018-03-06 DIAGNOSIS — Z90710 Acquired absence of both cervix and uterus: Secondary | ICD-10-CM | POA: Diagnosis not present

## 2018-03-06 DIAGNOSIS — H409 Unspecified glaucoma: Secondary | ICD-10-CM | POA: Diagnosis not present

## 2018-03-06 MED ORDER — ENSURE NUTRITION SHAKE PO LIQD: 237.0000 mL | Freq: Every day | ORAL | 3 refills | Status: DC

## 2018-03-06 NOTE — Patient Instructions (Signed)
Medication Instructions:  Drink one protein shake a day, we have given you a prescription for Ensure   Weigh patient and get Blood Pressure ONCE A WEEK     If you need a refill on your cardiac medications before your next appointment, please call your pharmacy.   Lab work: None today If you have labs (blood work) drawn today and your tests are completely normal, you will receive your results only by: Marland Kitchen MyChart Message (if you have MyChart) OR . A paper copy in the mail If you have any lab test that is abnormal or we need to change your treatment, we will call you to review the results.  Testing/Procedures: None today  Follow-Up: At [facility], you and your health needs are our priority.  As part of our continuing mission to provide you with exceptional heart care, we have created designated Provider Care Teams.  These Care Teams include your primary Cardiologist (physician) and Advanced Practice Providers (APPs -  Physician Assistants and Nurse Practitioners) who all work together to provide you with the care you need, when you need it. You will need a follow up appointment in 1 years.  Please call our office 2 months in advance to schedule this appointment.  You may see Carlyle Dolly, MD or one of the following Advanced Practice Providers on your designated Care Team:   Bernerd Pho, PA-C [facility]) . Ermalinda Barrios, PA-C ([place])  Any Other Special Instructions Will Be Listed Below (If Applicable). None

## 2018-03-06 NOTE — Progress Notes (Signed)
Cardiology Office Note    Date:  03/06/2018   ID:  Deborah Farrell, DOB [birth year], MRN [id number]  PCP:  Celene Squibb, MD  Cardiologist: Carlyle Dolly, MD    Chief Complaint  Patient presents with  . Follow-up    3 week visit    History of Present Illness:    Deborah Farrell is a 85 y.o. female with past medical history of HTN, palpitations, and lower extremity edema who presents to the office today for 3-week follow-up.  She was last examined by Dr. Harl Bowie on 02/13/2018 and had recently been admitted for acute gastroenteritis and troponin values were obtained during admission, peaking at 0.11. EKG showed no acute ischemic changes and with her denying any recent symptoms and in the setting of her advanced age, no further cardiac testing was pursued. She did have 2+ pitting edema at the time of her office visit which was thought to be secondary to the amount of IVF she received during admission. Given issues with hypotension in the past on diuretics, she was not started on Lasix at that time but close follow-up was arranged for if symptoms persisted, could consider the use of Lasix 20 mg daily.  In talking with the patient and her nephew today, she reports overall doing well from a cardiac perspective since her last office visit. She denies any recent chest pain or dyspnea on exertion. Works with physical therapy twice per week and was climbing steps earlier today. Denies any specific orthopnea or PND. Unsure if she has lower extremity edema as she does not frequently look at her legs. Reports putting a pillow under her legs at night to further elevate them.  She is excited to be relocating to [place] within the next month as she is currently not happy at her ALF.  She reports typically not consuming a large portion of her meals due to the food having little to no taste.  Past Medical History:  Diagnosis Date  . Allergy history, radiographic dye   . Arthritis   . Dysuria   .  Glaucoma   . HTN (hypertension)   . IBS (irritable bowel syndrome)    Non-ulcer dyspepsia; hemorrhoids  . Overactive bladder   . 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 Urinary frequency   . Urinary tract infection     Past Surgical History:  Procedure Laterality Date  . APPENDECTOMY    . CHOLECYSTECTOMY    . COLONOSCOPY  2008, 2011   diverticulitis diagnosed, 30mm sessile polyp removed inascending colon. area tattood for future observation and reccomended f/u colonoscopy in one year  . COLONOSCOPY N/A 04/24/2012   SLF:The colon IS redundant/ Single RECTAL polyp/Moderate diverticulosis/Small internal hemorrhoids  . OVARIAN CYST REMOVAL    . VAGINAL HYSTERECTOMY      Current Medications: Outpatient Medications Prior to Visit  Medication Sig Dispense Refill  . ALPRAZolam (XANAX) 0.5 MG tablet Take 1 tablet (0.5 mg total) by mouth at bedtime as needed for anxiety. 5 tablet 0  . amLODipine (NORVASC) 5 MG tablet Take 5 mg by mouth daily.    . carvedilol (COREG) 12.5 MG tablet Take 6.25 mg ( 1/2 tablet in the am) and 12. 5 mg (whole tablet) in the pm 135 tablet 3  . Iron-FA-B Cmp-C-Biot-Probiotic (FUSION PLUS PO) Take 1 capsule by mouth daily.    Marland Kitchen latanoprost (XALATAN) 0.005 %  ophthalmic solution Place 1 drop into both eyes at bedtime.      . memantine (NAMENDA) 5 MG tablet Take 5 mg by mouth 2 (two) times daily.    . Multiple Vitamins-Minerals (CENTRUM SILVER PO) Take 1 tablet by mouth daily.    Marland Kitchen MYRBETRIQ 25 MG TB24 tablet Take 25 mg by mouth at bedtime.   6  . nystatin cream (MYCOSTATIN) Apply 1 application topically 2 (two) times daily.    Marland Kitchen olmesartan (BENICAR) 20 MG tablet Take 20 mg by mouth daily.    . pantoprazole (PROTONIX) 40 MG tablet TAKE 1 TABLET BY MOUTH DAILY 90 tablet 1  . Probiotic Product (ALIGN PO) Take 1 tablet by mouth daily.     .  prochlorperazine (COMPAZINE) 5 MG tablet TAKE 1 TABLET BY MOUTH EVERY 6 HOURS AS NEEDED FOR NAUSEA OR VOMITING 180 tablet 3   No facility-administered medications prior to visit.      Allergies:   Tramadol; Aciphex [rabeprazole]; Colchicine; Dicyclomine hcl; Hyoscyamine sulfate; Iodinated diagnostic agents; Nexium [esomeprazole magnesium]; Omeprazole; and Sulfonamide derivatives   Social History   Socioeconomic History  . Marital status: Widowed    Spouse name: Not on file  . Number of children: Not on file  . Years of education: Not on file  . Highest education level: Not on file  Occupational History  . Occupation: retired    Fish farm manager: RETIRED  Social Needs  . Financial resource strain: Not on file  . Food insecurity:    Worry: Not on file    Inability: Not on file  . Transportation needs:    Medical: Not on file    Non-medical: Not on file  Tobacco Use  . Smoking status: Never Smoker  . Smokeless tobacco: Never Used  Substance and Sexual Activity  . Alcohol use: No  . Drug use: No  . Sexual activity: Never    Birth control/protection: Surgical    Comment: hyst  Lifestyle  . Physical activity:    Days per week: Not on file    Minutes per session: Not on file  . Stress: Not on file  Relationships  . Social connections:    Talks on phone: Not on file    Gets together: Not on file    Attends religious service: Not on file    Active member of club or organization: Not on file    Attends meetings of clubs or organizations: Not on file    Relationship status: Not on file  Other Topics Concern  . Not on file  Social History Narrative  . Not on file     Family History:  The patient's family history includes Cancer in her maternal grandfather; Heart attack in her sister; Heart disease in her sister; Stroke in her mother.   Review of Systems:   Please see the history of present illness.     General:  No chills, fever, night sweats or weight changes.    Cardiovascular:  No chest pain, dyspnea on exertion, orthopnea, palpitations, paroxysmal nocturnal dyspnea. Positive for edema.  Dermatological: No rash, lesions/masses Respiratory: No cough, dyspnea Urologic: No hematuria, dysuria Abdominal:   No nausea, vomiting, diarrhea, bright red blood per rectum, melena, or hematemesis Neurologic:  No visual changes, wkns, changes in mental status. All other systems reviewed and are otherwise negative except as noted above.   Physical Exam:    VS:  BP (!) 104/50 (BP Location: Right Arm)   Pulse 70   Ht 5\' 4"  (1.626  m)   Wt 139 lb (63 kg)   SpO2 98%   BMI 23.86 kg/m    General: Well developed, elderly African American female appearing in no acute distress. Head: Normocephalic, atraumatic, sclera non-icteric, no xanthomas, nares are without discharge.  Neck: No carotid bruits. JVD not elevated.  Lungs: Respirations regular and unlabored, without wheezing or rales.  Heart: Regular rate and rhythm. No S3 or S4.  No murmur, no rubs, or gallops appreciated. Abdomen: Soft, non-tender, non-distended with normoactive bowel sounds. No hepatomegaly. No rebound/guarding. No obvious abdominal masses. Msk:  Strength and tone appear normal for age. No joint deformities or effusions. Extremities: No clubbing or cyanosis. Trace ankle edema bilaterally.  Distal pedal pulses are 2+ bilaterally. Neuro: Alert and oriented X 3. Moves all extremities spontaneously. No focal deficits noted. Psych:  Responds to questions appropriately with a normal affect. Skin: No rashes or lesions noted  Wt Readings from Last 3 Encounters:  03/06/18 139 lb (63 kg)  02/13/18 143 lb (64.9 kg)  02/06/18 139 lb 15.9 oz (63.5 kg)     Studies/Labs Reviewed:   EKG:  EKG is not ordered today.   Recent Labs: 02/05/2018: TSH 2.038 02/06/2018: ALT 117; BUN 18; Creatinine, Ser 0.98; Hemoglobin 9.4; Magnesium 1.7; Platelets 179; Potassium 3.3; Sodium 138   Lipid Panel    Component  Value Date/Time   CHOL 208 (H) 12/24/2010 0835   TRIG 122 12/24/2010 0835   HDL 48 12/24/2010 0835   CHOLHDL 4.3 12/24/2010 0835   VLDL 24 12/24/2010 0835   LDLCALC 136 (H) 12/24/2010 0835    Additional studies/ records that were reviewed today include:   CXR: 02/05/2018 FINDINGS: Mildly low lung volumes with probable atelectatic opacity on the lateral view. There is no edema, consolidation, effusion, or pneumothorax. Stable likely normal heart size. Negative aortic and hilar contours.  IMPRESSION: No evidence of acute disease.  Assessment:    1. Leg edema   2. Essential hypertension   3. Palpitations   4. Decreased appetite      Plan:   In order of problems listed above:  1. Lower Extremity Edema - She previously had 2+ pitting edema at the time of her last office visit but was not started on diuretic therapy at that time as she had issues with orthostasis with this in the past. Symptoms were thought to be secondary to the amount of IVF she received during her recent admission.  - On examination today, her lower extremity edema has significantly improved and she only has trace ankle edema at this time. Would continue with conservative management including elevating her lower extremities and can use compression stockings in the future if edema represents. We also reviewed limiting her sodium intake. Have asked for weekly weights to be obtained at her ALF.   2. HTN - BP is low-normal at 104/50 during today's visit. Systolic numbers had previously been in the 110's to 130's during her admission. I have asked for her blood pressure to be checked at least a minimum of once per week at her ALF as she reports this has not been checked in over a month. - Remains on Amlodipine 5 mg daily, Coreg 6.25 in AM/12.5 mg in PM, and Olmesartan 20 mg daily. If BP remains soft, would recommend reducing Amlodipine to 2.5 mg daily.  3. Palpitations - She denies any recent symptoms. Remains on  Coreg 6.25mg  in AM/12.5mg  in PM.   4. Decreased Appetite - Reports consuming less food due to not  liking the cooking at her current ALF.  Will write for Ensure supplements.   Medication Adjustments/Labs and Tests Ordered: Current medicines are reviewed at length with the patient today.  Concerns regarding medicines are outlined above.  Medication changes, Labs and Tests ordered today are listed in the Patient Instructions below. Patient Instructions  Medication Instructions:  Drink one protein shake a day, we have given you a prescription for Ensure   Weigh patient and get Blood Pressure ONCE A WEEK     If you need a refill on your cardiac medications before your next appointment, please call your pharmacy.   Lab work: None today If you have labs (blood work) drawn today and your tests are completely normal, you will receive your results only by: Marland Kitchen MyChart Message (if you have MyChart) OR . A paper copy in the mail If you have any lab test that is abnormal or we need to change your treatment, we will call you to review the results.  Testing/Procedures: None today  Follow-Up: At [facility], you and your health needs are our priority.  As part of our continuing mission to provide you with exceptional heart care, we have created designated Provider Care Teams.  These Care Teams include your primary Cardiologist (physician) and Advanced Practice Providers (APPs -  Physician Assistants and Nurse Practitioners) who all work together to provide you with the care you need, when you need it. You will need a follow up appointment in 1 years.  Please call our office 2 months in advance to schedule this appointment.  You may see Carlyle Dolly, MD or one of the following Advanced Practice Providers on your designated Care Team:   Bernerd Pho, PA-C [facility]) . Ermalinda Barrios, PA-C ([place])  Any Other Special Instructions Will Be Listed Below (If Applicable). None     Signed, Erma Heritage, PA-C  03/06/2018 4:46 PM    [facility] [address] [place], [place] [address] Phone: [phone] Fax: [phone]

## 2018-03-09 DIAGNOSIS — B372 Candidiasis of skin and nail: Secondary | ICD-10-CM | POA: Diagnosis not present

## 2018-03-09 DIAGNOSIS — R945 Abnormal results of liver function studies: Secondary | ICD-10-CM | POA: Diagnosis not present

## 2018-03-09 DIAGNOSIS — L989 Disorder of the skin and subcutaneous tissue, unspecified: Secondary | ICD-10-CM | POA: Diagnosis not present

## 2018-03-09 DIAGNOSIS — E876 Hypokalemia: Secondary | ICD-10-CM | POA: Diagnosis not present

## 2018-03-09 DIAGNOSIS — D649 Anemia, unspecified: Secondary | ICD-10-CM | POA: Diagnosis not present

## 2018-03-09 DIAGNOSIS — R55 Syncope and collapse: Secondary | ICD-10-CM | POA: Diagnosis not present

## 2018-03-09 DIAGNOSIS — I10 Essential (primary) hypertension: Secondary | ICD-10-CM | POA: Diagnosis not present

## 2018-03-09 DIAGNOSIS — D509 Iron deficiency anemia, unspecified: Secondary | ICD-10-CM | POA: Diagnosis not present

## 2018-03-10 ENCOUNTER — Encounter (HOSPITAL_COMMUNITY): Payer: Self-pay | Admitting: Emergency Medicine

## 2018-03-10 ENCOUNTER — Observation Stay (HOSPITAL_COMMUNITY)
Admission: EM | Admit: 2018-03-10 | Discharge: 2018-03-11 | Disposition: A | Discharge status: Home / Self Care | Payer: Medicare Other | Attending: Internal Medicine | Admitting: Internal Medicine

## 2018-03-10 ENCOUNTER — Observation Stay (HOSPITAL_COMMUNITY): Payer: Medicare Other

## 2018-03-10 ENCOUNTER — Other Ambulatory Visit: Payer: Self-pay

## 2018-03-10 ENCOUNTER — Emergency Department (HOSPITAL_COMMUNITY): Payer: Medicare Other

## 2018-03-10 DIAGNOSIS — I10 Essential (primary) hypertension: Secondary | ICD-10-CM | POA: Insufficient documentation

## 2018-03-10 DIAGNOSIS — R531 Weakness: Secondary | ICD-10-CM | POA: Diagnosis not present

## 2018-03-10 DIAGNOSIS — R197 Diarrhea, unspecified: Secondary | ICD-10-CM

## 2018-03-10 DIAGNOSIS — R509 Fever, unspecified: Secondary | ICD-10-CM | POA: Diagnosis not present

## 2018-03-10 DIAGNOSIS — R2681 Unsteadiness on feet: Secondary | ICD-10-CM | POA: Diagnosis not present

## 2018-03-10 DIAGNOSIS — J111 Influenza due to unidentified influenza virus with other respiratory manifestations: Principal | ICD-10-CM | POA: Insufficient documentation

## 2018-03-10 DIAGNOSIS — S0990XA Unspecified injury of head, initial encounter: Secondary | ICD-10-CM | POA: Diagnosis not present

## 2018-03-10 DIAGNOSIS — J984 Other disorders of lung: Secondary | ICD-10-CM | POA: Diagnosis not present

## 2018-03-10 LAB — CBC WITH DIFFERENTIAL/PLATELET
Abs Immature Granulocytes: 0.03 10*3/uL (ref 0.00–0.07)
BASOS PCT: 0 %
Basophils Absolute: 0 10*3/uL (ref 0.0–0.1)
Eosinophils Absolute: 0 10*3/uL (ref 0.0–0.5)
Eosinophils Relative: 0 %
HCT: 33 % — ABNORMAL LOW (ref 36.0–46.0)
Hemoglobin: 10.3 g/dL — ABNORMAL LOW (ref 12.0–15.0)
Immature Granulocytes: 0 %
Lymphocytes Relative: 15 %
Lymphs Abs: 1.5 10*3/uL (ref 0.7–4.0)
MCH: 29.9 pg (ref 26.0–34.0)
MCHC: 31.2 g/dL (ref 30.0–36.0)
MCV: 95.9 fL (ref 80.0–100.0)
MONO ABS: 0.9 10*3/uL (ref 0.1–1.0)
Monocytes Relative: 9 %
Neutro Abs: 7.5 10*3/uL (ref 1.7–7.7)
Neutrophils Relative %: 76 %
Platelets: 182 10*3/uL (ref 150–400)
RBC: 3.44 MIL/uL — ABNORMAL LOW (ref 3.87–5.11)
RDW: 13.7 % (ref 11.5–15.5)
WBC: 10 10*3/uL (ref 4.0–10.5)
nRBC: 0 % (ref 0.0–0.2)

## 2018-03-10 LAB — COMPREHENSIVE METABOLIC PANEL
ALT: 27 U/L (ref 0–44)
AST: 30 U/L (ref 15–41)
Albumin: 3.6 g/dL (ref 3.5–5.0)
Alkaline Phosphatase: 55 U/L (ref 38–126)
Anion gap: 10 (ref 5–15)
BUN: 23 mg/dL (ref 8–23)
CALCIUM: 8.5 mg/dL — AB (ref 8.9–10.3)
CO2: 23 mmol/L (ref 22–32)
Chloride: 99 mmol/L (ref 98–111)
Creatinine, Ser: 1.23 mg/dL — ABNORMAL HIGH (ref 0.44–1.00)
GFR calc Af Amer: 42 mL/min — ABNORMAL LOW (ref >60)
GFR, EST NON AFRICAN AMERICAN: 36 mL/min — AB (ref >60)
Glucose, Bld: 116 mg/dL — ABNORMAL HIGH (ref 70–99)
Potassium: 3.7 mmol/L (ref 3.5–5.1)
Sodium: 132 mmol/L — ABNORMAL LOW (ref 135–145)
Total Bilirubin: 0.8 mg/dL (ref 0.3–1.2)
Total Protein: 6.9 g/dL (ref 6.5–8.1)

## 2018-03-10 LAB — INFLUENZA PANEL BY PCR (TYPE A & B)
Influenza A By PCR: POSITIVE — AB
Influenza B By PCR: NEGATIVE

## 2018-03-10 LAB — URINALYSIS, ROUTINE W REFLEX MICROSCOPIC
Bilirubin Urine: NEGATIVE
GLUCOSE, UA: NEGATIVE mg/dL
Hgb urine dipstick: NEGATIVE
Ketones, ur: NEGATIVE mg/dL
Leukocytes,Ua: NEGATIVE
Nitrite: NEGATIVE
Protein, ur: NEGATIVE mg/dL
Specific Gravity, Urine: 1.01 (ref 1.005–1.030)
pH: 6 (ref 5.0–8.0)

## 2018-03-10 LAB — PROTIME-INR
INR: 1.1 (ref 0.8–1.2)
Prothrombin Time: 13.6 seconds (ref 11.4–15.2)

## 2018-03-10 LAB — CBG MONITORING, ED: Glucose-Capillary: 119 mg/dL — ABNORMAL HIGH (ref 70–99)

## 2018-03-10 LAB — LACTIC ACID, PLASMA: Lactic Acid, Venous: 1.1 mmol/L (ref 0.5–1.9)

## 2018-03-10 MED ORDER — MEMANTINE HCL 10 MG PO TABS
5.0000 mg | ORAL_TABLET | Freq: Two times a day (BID) | ORAL | Status: DC
  Administered 2018-03-10 – 2018-03-11 (×2): 5 mg via ORAL
  Filled 2018-03-10 (×2): qty 1

## 2018-03-10 MED ORDER — METRONIDAZOLE IN NACL 5-0.79 MG/ML-% IV SOLN
500.0000 mg | Freq: Once | INTRAVENOUS | Status: AC
  Administered 2018-03-10: 500 mg via INTRAVENOUS
  Filled 2018-03-10: qty 100

## 2018-03-10 MED ORDER — IBUPROFEN 400 MG PO TABS
400.0000 mg | ORAL_TABLET | Freq: Once | ORAL | Status: AC
  Administered 2018-03-10: 400 mg via ORAL
  Filled 2018-03-10: qty 1

## 2018-03-10 MED ORDER — LATANOPROST 0.005 % OP SOLN
1.0000 [drp] | Freq: Every day | OPHTHALMIC | Status: DC
  Filled 2018-03-10 (×2): qty 2.5

## 2018-03-10 MED ORDER — POTASSIUM CHLORIDE IN NACL 20-0.9 MEQ/L-% IV SOLN
INTRAVENOUS | Status: AC
  Administered 2018-03-10: 23:00:00 via INTRAVENOUS

## 2018-03-10 MED ORDER — ACETAMINOPHEN 650 MG RE SUPP: 650.0000 mg | Freq: Four times a day (QID) | RECTAL | Status: DC | PRN

## 2018-03-10 MED ORDER — POLYETHYLENE GLYCOL 3350 17 G PO PACK: 17.0000 g | PACK | Freq: Every day | ORAL | Status: DC | PRN

## 2018-03-10 MED ORDER — HEPARIN SODIUM (PORCINE) 5000 UNIT/ML IJ SOLN
5000.0000 [IU] | Freq: Three times a day (TID) | INTRAMUSCULAR | Status: DC
  Administered 2018-03-10 – 2018-03-11 (×2): 5000 [IU] via SUBCUTANEOUS
  Filled 2018-03-10: qty 1

## 2018-03-10 MED ORDER — ONDANSETRON HCL 4 MG/2ML IJ SOLN: 4.0000 mg | Freq: Four times a day (QID) | INTRAMUSCULAR | Status: DC | PRN

## 2018-03-10 MED ORDER — ACETAMINOPHEN 325 MG PO TABS: 650.0000 mg | ORAL_TABLET | Freq: Four times a day (QID) | ORAL | Status: DC | PRN

## 2018-03-10 MED ORDER — ENSURE ENLIVE PO LIQD: 237.0000 mL | Freq: Two times a day (BID) | ORAL | Status: DC

## 2018-03-10 MED ORDER — LACTATED RINGERS IV BOLUS
1000.0000 mL | Freq: Once | INTRAVENOUS | Status: AC
  Administered 2018-03-10: 1000 mL via INTRAVENOUS

## 2018-03-10 MED ORDER — ONDANSETRON HCL 4 MG PO TABS: 4.0000 mg | ORAL_TABLET | Freq: Four times a day (QID) | ORAL | Status: DC | PRN

## 2018-03-10 MED ORDER — MIRABEGRON ER 25 MG PO TB24
25.0000 mg | ORAL_TABLET | Freq: Every day | ORAL | Status: DC
  Administered 2018-03-10: 25 mg via ORAL
  Filled 2018-03-10: qty 1

## 2018-03-10 MED ORDER — CIPROFLOXACIN IN D5W 400 MG/200ML IV SOLN
400.0000 mg | Freq: Once | INTRAVENOUS | Status: AC
  Administered 2018-03-10: 400 mg via INTRAVENOUS
  Filled 2018-03-10: qty 200

## 2018-03-10 MED ORDER — OSELTAMIVIR PHOSPHATE 30 MG PO CAPS
30.0000 mg | ORAL_CAPSULE | Freq: Every day | ORAL | Status: DC
  Administered 2018-03-10: 30 mg via ORAL
  Filled 2018-03-10: qty 1

## 2018-03-10 MED ORDER — PANTOPRAZOLE SODIUM 40 MG PO TBEC
40.0000 mg | DELAYED_RELEASE_TABLET | Freq: Every day | ORAL | Status: DC
  Administered 2018-03-11: 40 mg via ORAL
  Filled 2018-03-10: qty 1

## 2018-03-10 NOTE — ED Notes (Signed)
Patient waiting for Head CT before going upstairs

## 2018-03-10 NOTE — ED Triage Notes (Signed)
Pt is from [place] called nephew and told him pt had a fall this morning in her bathroom due to generalized weakness. Pt denies LOC or hitting head. Pt AOx4.

## 2018-03-10 NOTE — ED Notes (Signed)
Patient transported to CT 

## 2018-03-10 NOTE — H&P (Addendum)
History and Physical    CANDIDA VETTER BDZ:[id number] DOB: 08-22-1917 DOA: 03/10/2018  PCP: Celene Squibb, MD   Patient coming from: [place]  I have personally briefly reviewed patient's old medical records in [place]  Chief Complaint: Fall, weakness  HPI: Deborah Farrell is a 85 y.o. female with medical history significant for dementia, HTN, IBS, diverticulosis, who presented to the ED after she fell today.  She reports she fell because of the Rollator.  She tells me she hit her head when she fell. She denies chest pain or difficulty breathing.  She is able to answer a few questions but not give me a lot of details.  She reports generalized weakness.  She denies cough, she is not positive on new loose stools or vomiting, she denies burning with urination.  No abdominal pain.  Recent hospitalization 1/23- 1/24 for acute gastroenteritis.  ED Course: Febrile to 100.9, hypotensive 82/41.  Heart rate 60s to 70s.  O2 sats greater than 95% on room air.  WBC 10.  Creatinine 1.23 about baseline.  Lactic acid 1.1.  Portable chest x-ray no acute abnormality.  UA clean.  Patient was started on IV Cipro and metronidazole for presumed GI pathology.  Blood Pressure improved with 1 L bolus.  Hospitalist to admit for generalized weakness and possible gastroenteritis   Rreview of Systems: As per HPI all other systems reviewed and negative.  Past Medical History:  Diagnosis Date  . Allergy history, radiographic dye   . Arthritis   . Dysuria   . Glaucoma   . HTN (hypertension)   . IBS (irritable bowel syndrome)    Non-ulcer dyspepsia; hemorrhoids  . Overactive bladder   . 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 Urinary frequency   . Urinary tract infection     Past Surgical History:  Procedure Laterality Date  . APPENDECTOMY    . CHOLECYSTECTOMY    .  COLONOSCOPY  2008, 2011   diverticulitis diagnosed, 61mm sessile polyp removed inascending colon. area tattood for future observation and reccomended f/u colonoscopy in one year  . COLONOSCOPY N/A 04/24/2012   SLF:The colon IS redundant/ Single RECTAL polyp/Moderate diverticulosis/Small internal hemorrhoids  . OVARIAN CYST REMOVAL    . VAGINAL HYSTERECTOMY       reports that she has never smoked. She has never used smokeless tobacco. She reports that she does not drink alcohol or use drugs.  Allergies  Allergen Reactions  . Tramadol Anaphylaxis  . Aciphex [Rabeprazole] Other (See Comments)    intolerance  . Colchicine   . Dicyclomine Hcl Other (See Comments)    Unknown  . Hyoscyamine Sulfate Other (See Comments)    Unknown  . Iodinated Diagnostic Agents   . Nexium [Esomeprazole Magnesium] Other (See Comments)    G.I. Upset  . Omeprazole Other (See Comments)    GI upset  . Sulfonamide Derivatives Other (See Comments)    Unknown    Family History  Problem Relation Age of Onset  . Stroke Mother   . Heart attack Sister   . Heart disease Sister   . Cancer Maternal Grandfather     Prior to Admission medications   Medication Sig Start Date End Date Taking? Authorizing Provider  ALPRAZolam Duanne Moron) 0.5 MG tablet Take 1 tablet (0.5 mg total) by mouth at bedtime as needed for anxiety. Patient taking differently: Take  0.5 mg by mouth every evening.  02/06/18  Yes Johnson, Clanford L, MD  amLODipine (NORVASC) 5 MG tablet Take 5 mg by mouth daily.   Yes [provider]  Iron-FA-B Cmp-C-Biot-Probiotic (FUSION PLUS PO) Take 1 capsule by mouth daily.   Yes [provider]  latanoprost (XALATAN) 0.005 % ophthalmic solution Place 1 drop into both eyes at bedtime.     Yes [provider]  memantine (NAMENDA) 5 MG tablet Take 5 mg by mouth 2 (two) times daily.   Yes [provider]  Menthol, Topical Analgesic, (BIOFREEZE EX) Apply 1 application topically 3  (three) times daily. Applied to toes bilateral feet   Yes [provider]  Multiple Vitamins-Minerals (CENTRUM SILVER PO) Take 1 tablet by mouth daily.   Yes [provider]  MYRBETRIQ 25 MG TB24 tablet Take 25 mg by mouth at bedtime.  03/09/16  Yes [provider]  Nutritional Supplements (ENSURE NUTRITION SHAKE) LIQD Take 237 mLs by mouth daily. 03/06/18  Yes Strader, [place], PA-C  olmesartan (BENICAR) 20 MG tablet Take 20 mg by mouth daily.   Yes [provider]  pantoprazole (PROTONIX) 40 MG tablet TAKE 1 TABLET BY MOUTH DAILY Patient taking differently: Take 40 mg by mouth daily.  12/10/17  Yes Carlis Stable, NP  Probiotic Product (ALIGN PO) Take 1 tablet by mouth daily.    Yes [provider]  prochlorperazine (COMPAZINE) 5 MG tablet TAKE 1 TABLET BY MOUTH EVERY 6 HOURS AS NEEDED FOR NAUSEA OR VOMITING Patient taking differently: Take by mouth every 6 (six) hours as needed for nausea or vomiting.  08/13/17  Yes Carlis Stable, NP    Physical Exam: Vitals:   03/10/18 1330 03/10/18 1430 03/10/18 1500 03/10/18 1530  BP: (!) 113/49 (!) 114/54 (!) 96/45 (!) 101/54  Pulse: 69 71 67 72  Resp: 20 19 17  (!) 21  Temp:      TempSrc:      SpO2: 95% 97% 96% 97%  Weight:      Height:        Constitutional: NAD, calm, comfortable-looks significantly younger than stated age Vitals:   03/10/18 1330 03/10/18 1430 03/10/18 1500 03/10/18 1530  BP: (!) 113/49 (!) 114/54 (!) 96/45 (!) 101/54  Pulse: 69 71 67 72  Resp: 20 19 17  (!) 21  Temp:      TempSrc:      SpO2: 95% 97% 96% 97%  Weight:      Height:       Eyes: PERRL, lids and conjunctivae normal ENMT: Mucous membranes are dry. Posterior pharynx clear of any exudate or lesions.  Neck: normal, supple, no masses, no thyromegaly Respiratory: clear to auscultation bilaterally, no wheezing, no crackles. Normal respiratory effort. No accessory muscle use.  Cardiovascular: Regular rate and rhythm, no  murmurs / rubs / gallops. No extremity edema. 2+ pedal pulses.  Abdomen: no tenderness, no masses palpated. No hepatosplenomegaly. Bowel sounds positive.  Musculoskeletal: no clubbing / cyanosis. No joint deformity upper and lower extremities. Good ROM, no contractures. Normal muscle tone.  Skin: no rashes, lesions, ulcers. No induration Neurologic: CN 2-12 grossly intact.  Strength 5/5 in all 4.  Psychiatric: Normal judgment and insight. Alert and oriented x 3. Normal mood.   Labs on Admission: I have personally reviewed following labs and imaging studies  CBC: Recent Labs  Lab 03/10/18 1313  WBC 10.0  NEUTROABS 7.5  HGB 10.3*  HCT 33.0*  MCV 95.9  PLT 182  Basic Metabolic Panel: Recent Labs  Lab 03/10/18 1313  NA 132*  K 3.7  CL 99  CO2 23  GLUCOSE 116*  BUN 23  CREATININE 1.23*  CALCIUM 8.5*   Liver Function Tests: Recent Labs  Lab 03/10/18 1313  AST 30  ALT 27  ALKPHOS 55  BILITOT 0.8  PROT 6.9  ALBUMIN 3.6   Coagulation Profile: Recent Labs  Lab 03/10/18 1313  INR 1.1   CBG: Recent Labs  Lab 03/10/18 1245  GLUCAP 119*   Urine analysis:    Component Value Date/Time   COLORURINE YELLOW 03/10/2018 1242   APPEARANCEUR HAZY (A) 03/10/2018 1242   LABSPEC 1.010 03/10/2018 1242   PHURINE 6.0 03/10/2018 1242   GLUCOSEU NEGATIVE 03/10/2018 1242   HGBUR NEGATIVE 03/10/2018 1242   BILIRUBINUR NEGATIVE 03/10/2018 1242   KETONESUR NEGATIVE 03/10/2018 1242   PROTEINUR NEGATIVE 03/10/2018 1242   UROBILINOGEN 1.0 11/02/2011 2208   NITRITE NEGATIVE 03/10/2018 1242   LEUKOCYTESUR NEGATIVE 03/10/2018 1242    Radiological Exams on Admission: Dg Chest Port 1 View  Result Date: 03/10/2018 CLINICAL DATA:  86 year old female with weakness EXAM: PORTABLE CHEST 1 VIEW COMPARISON:  02/05/2018 FINDINGS: Cardiomediastinal silhouette unchanged in size and contour. No evidence of central vascular congestion. No interlobular septal thickening. Low lung volumes  persist. No evidence pneumothorax or pleural effusion. No confluent airspace disease. Coarsened interstitial markings similar to prior. IMPRESSION: Chronic lung changes without evidence of acute cardiopulmonary disease Electronically Signed   By: Corrie Mckusick D.O.   On: 03/10/2018 13:48    EKG: Independently reviewed.  Sinus rhythm With sinus arrhythmia.  Occasional PVC.  Assessment/Plan Active Problems:   Generalized weakness   Generalized weakness-with fever 100.9.  Initial hypotension systolic 82.  WBC 10.  Lactic acid 1.1.  Patient denies any particular GI, respiratory or GU symptoms.  Port Chest x-ray UA unremarkable.  Benign exam. S/p 1L bolus -Hydrate n/s + 20KCL 100cc/hr x 15 hrs -Hold off on further antibiotics at this time -Follow-up blood cultures and urine cultures -Influenza panel-came back positive, Tamiflu started  Fall -from weakness and influenza. Reports she hit her head. -head CT-atrophic, chronic microvascular disease. - PT eval - D/c back to nursing home  HTN-hypotensive. -Hydrate -Hold home losartan, amlodipine  Dementia- awake and oriented, able to answer simple questions appropriately.  Nursing home resident since recent admission for gastroenteritis. -Continue home Namenda  CKD3- Cr 1.23, at baseline. -Hydrate.   DVT prophylaxis: Heparin Code Status: DNR, confirmed with niece Izora Gala on the phone ( she is Economist) , at patient's bedside with 2 other relatives- also nieces and nephew at bedside Family Communication: Niece and nephew at bedside. Disposition Plan: Per rounding team Consults called: None Admission status: Obs, tele   Bethena Roys MD [facility]  03/10/2018, 8:04 PM

## 2018-03-10 NOTE — ED Notes (Signed)
Patient returned from CT

## 2018-03-10 NOTE — ED Notes (Signed)
Patient is very week when standing at bedside.

## 2018-03-10 NOTE — ED Provider Notes (Signed)
Emergency Department Provider Note   I have reviewed the triage vital signs and the nursing notes.   HISTORY  Chief Complaint Weakness   HPI Deborah Farrell is a 85 y.o. female who is here from a skilled nursing facility secondary to weakness.  Patient was admitted to the hospital for 24-hour stay for gastroenteritis, dehydration weakness was discharged yesterday.  Some of the patient had a fall this morning in the shower because of generalized weakness.  Patient was sent here for further evaluation with initial low blood pressure.  Patient states she does feel little bit generally weak but has no other complaints.  She does not complain any chest pain, back pain, hip pain or extremity pain.  She did not think she hurting when she fell today. No other associated or modifying symptoms.    Past Medical History:  Diagnosis Date  . Allergy history, radiographic dye   . Arthritis   . Dysuria   . Glaucoma   . HTN (hypertension)   . IBS (irritable bowel syndrome)    Non-ulcer dyspepsia; hemorrhoids  . Overactive bladder   . Tubulovillous adenoma 2007   with multifocal high grade dysplasia-WFBH 2007;TV ADENOMA w/o dysplasia 2008;  SIMPLE ADENOMA 2009; 106mm SIMPLE ADENOMA JAN 2011  . Urinary frequency   . Urinary tract infection     Patient Active Problem List   Diagnosis Date Noted  . Acute gastroenteritis 02/05/2018  . Dehydration 02/05/2018  . Elevated liver enzymes 02/05/2018  . Low grade fever 02/05/2018  . Dementia ([place]) 02/05/2018  . Diverticulosis 02/05/2018  . Generalized weakness 02/05/2018  . Hx of adenomatous colonic polyps 10/21/2017  . Vulvar discomfort 08/07/2017  . Nocturia 07/27/2014  . Recurrent cystitis 07/08/2013  . Atrophic vaginitis 07/08/2013  . Internal hemorrhoids with complication 32/99/2426  . Stiffness of joints, not elsewhere classified, multiple sites 11/19/2012  . Poor balance 11/19/2012  . Hip pain, right 11/20/2011  . Back pain,  lumbosacral 11/20/2011  . Dyspepsia 07/19/2011  . Diarrhea 07/19/2011  . Tubulovillous adenoma   . Hyperlipidemia 08/20/2007  . Unspecified glaucoma 08/20/2007  . Essential hypertension 08/20/2007  . Gastroesophageal reflux disease 08/20/2007  . Constipation 08/20/2007  . Irritable bowel syndrome 08/20/2007    Past Surgical History:  Procedure Laterality Date  . APPENDECTOMY    . CHOLECYSTECTOMY    . COLONOSCOPY  2008, 2011   diverticulitis diagnosed, 70mm sessile polyp removed inascending colon. area tattood for future observation and reccomended f/u colonoscopy in one year  . COLONOSCOPY N/A 04/24/2012   SLF:The colon IS redundant/ Single RECTAL polyp/Moderate diverticulosis/Small internal hemorrhoids  . OVARIAN CYST REMOVAL    . VAGINAL HYSTERECTOMY      Current Outpatient Rx  . Order #: [id number] Class: Print  . Order #: [id number] Class: Historical Med  . Order #: [id number] Class: Historical Med  . Order #: [id number] Class: Historical Med  . Order #: [id number] Class: Historical Med  . Order #: [id number] Class: Historical Med  . Order #: [id number] Class: Historical Med  . Order #: [id number] Class: Historical Med  . Order #: [id number] Class: Print  . Order #: [id number] Class: Historical Med  . Order #: [id number] Class: Normal  . Order #: [id number] Class: Historical Med  . Order #: [id number] Class: Normal    Allergies Tramadol; Aciphex [rabeprazole]; Colchicine; Dicyclomine hcl; Hyoscyamine sulfate; Iodinated diagnostic agents; Nexium [esomeprazole magnesium]; Omeprazole; and Sulfonamide derivatives  Family History  Problem Relation Age of Onset  . Stroke Mother   . Heart attack Sister   . Heart disease Sister   .  Cancer Maternal Grandfather     Social History Social History   Tobacco Use  . Smoking status: Never Smoker  . Smokeless tobacco: Never Used  Substance Use Topics  . Alcohol use: No  . Drug use: No    Review of Systems  All other systems negative except as  documented in the HPI. All pertinent positives and negatives as reviewed in the HPI. ____________________________________________   PHYSICAL EXAM:  VITAL SIGNS: ED Triage Vitals  Enc Vitals Group     BP 03/10/18 1250 (S) (!) 82/41     Pulse Rate 03/10/18 1250 70     Resp 03/10/18 1250 16     Temp 03/10/18 1250 (!) 100.9 F (38.3 C)     Temp Source 03/10/18 1250 Oral     SpO2 03/10/18 1250 95 %     Weight 03/10/18 1320 138 lb 14.2 oz (63 kg)     Height 03/10/18 1320 5\' 4"  (1.626 m)    Constitutional: Alert and oriented. Well appearing and in no acute distress. Eyes: Conjunctivae are normal. PERRL. EOMI. Head: Atraumatic. Nose: No congestion/rhinnorhea. Mouth/Throat: Mucous membranes are dry.  Oropharynx non-erythematous. Neck: No stridor.  No meningeal signs.   Cardiovascular: Normal rate, regular rhythm. Good peripheral circulation. Grossly normal heart sounds.   Respiratory: Normal respiratory effort.  No retractions. Lungs CTAB. Gastrointestinal: Soft and nontender. No distention.  Musculoskeletal: No lower extremity tenderness nor edema. No gross deformities of extremities. Neurologic:  Normal speech and language. No gross focal neurologic deficits are appreciated.  Skin:  Skin is warm, dry and intact.  Pale.   ____________________________________________   LABS (all labs ordered are listed, but only abnormal results are displayed)  Labs Reviewed  URINALYSIS, ROUTINE W REFLEX MICROSCOPIC - Abnormal; Notable for the following components:      Result Value   APPearance HAZY (*)    All other components within normal limits  COMPREHENSIVE METABOLIC PANEL - Abnormal; Notable for the following components:   Sodium 132 (*)    Glucose, Bld 116 (*)    Creatinine, Ser 1.23 (*)    Calcium 8.5 (*)    GFR calc non Af Amer 36 (*)    GFR calc Af Amer 42 (*)    All other components within normal limits  CBC WITH DIFFERENTIAL/PLATELET - Abnormal; Notable for the following  components:   RBC 3.44 (*)    Hemoglobin 10.3 (*)    HCT 33.0 (*)    All other components within normal limits  CBG MONITORING, ED - Abnormal; Notable for the following components:   Glucose-Capillary 119 (*)    All other components within normal limits  CULTURE, BLOOD (ROUTINE X 2)  CULTURE, BLOOD (ROUTINE X 2)  LACTIC ACID, PLASMA  PROTIME-INR   ____________________________________________  EKG   EKG Interpretation  Date/Time:  Tuesday March 10 2018 12:46:22 EST Ventricular Rate:  73 PR Interval:  178 QRS Duration: 72 QT Interval:  368 QTC Calculation: 405 R Axis:   16 Text Interpretation:  Sinus rhythm with marked sinus arrhythmia with occasional Premature ventricular complexes Minimal voltage criteria for LVH, may be normal variant Borderline ECG No significant change since last tracing Confirmed by Merrily Pew [phone]) on 03/10/2018 2:20:08 PM       ____________________________________________  RADIOLOGY  Dg Chest Port 1 View  Result Date: 03/10/2018 CLINICAL DATA:  85 year old female with weakness EXAM: PORTABLE CHEST 1 VIEW COMPARISON:  02/05/2018 FINDINGS: Cardiomediastinal silhouette unchanged in size and contour. No evidence of central vascular  congestion. No interlobular septal thickening. Low lung volumes persist. No evidence pneumothorax or pleural effusion. No confluent airspace disease. Coarsened interstitial markings similar to prior. IMPRESSION: Chronic lung changes without evidence of acute cardiopulmonary disease Electronically Signed   By: Corrie Mckusick D.O.   On: 03/10/2018 13:48    ____________________________________________   INITIAL IMPRESSION / ASSESSMENT AND PLAN / ED COURSE  Initially hypotensive. Likely hypovolemia.   Fever. Sepsis called. Fluids already ordered. antibitoics for intraabdominal infection ordered.   Patient with significant improvement in her weakness but blood pressure is only minimally improved.  She is 85 years old was  just in the hospital for the same thing was febrile when she got here will observe in the hospital overnight with further fluids and wait for cultures to ensure no of sepsis or bacteremia.  Discussed this with hospitalist will admit.  Pertinent labs & imaging results that were available during my care of the patient were reviewed by me and considered in my medical decision making (see chart for details).  ____________________________________________  FINAL CLINICAL IMPRESSION(S) / ED DIAGNOSES  Final diagnoses:  Weakness  Fever, unspecified fever cause  Diarrhea, unspecified type     MEDICATIONS GIVEN DURING THIS VISIT:  Medications  ibuprofen (ADVIL,MOTRIN) tablet 400 mg (400 mg Oral Given 03/10/18 1326)  lactated ringers bolus 1,000 mL (0 mLs Intravenous Stopped 03/10/18 1431)  ciprofloxacin (CIPRO) IVPB 400 mg (0 mg Intravenous Stopped 03/10/18 1518)  metroNIDAZOLE (FLAGYL) IVPB 500 mg (0 mg Intravenous Stopped 03/10/18 1431)     NEW OUTPATIENT MEDICATIONS STARTED DURING THIS VISIT:  New Prescriptions   No medications on file    Note:  This note was prepared with assistance of Dragon voice recognition software. Occasional wrong-word or sound-a-like substitutions may have occurred due to the inherent limitations of voice recognition software.   Merrily Pew, MD 03/10/18 [phone]

## 2018-03-11 DIAGNOSIS — R531 Weakness: Secondary | ICD-10-CM

## 2018-03-11 LAB — CBC
HCT: 30.5 % — ABNORMAL LOW (ref 36.0–46.0)
Hemoglobin: 9.5 g/dL — ABNORMAL LOW (ref 12.0–15.0)
MCH: 30.2 pg (ref 26.0–34.0)
MCHC: 31.1 g/dL (ref 30.0–36.0)
MCV: 96.8 fL (ref 80.0–100.0)
Platelets: 183 10*3/uL (ref 150–400)
RBC: 3.15 MIL/uL — AB (ref 3.87–5.11)
RDW: 13.6 % (ref 11.5–15.5)
WBC: 5.8 10*3/uL (ref 4.0–10.5)
nRBC: 0 % (ref 0.0–0.2)

## 2018-03-11 LAB — BASIC METABOLIC PANEL
Anion gap: 8 (ref 5–15)
BUN: 20 mg/dL (ref 8–23)
CALCIUM: 8.6 mg/dL — AB (ref 8.9–10.3)
CO2: 24 mmol/L (ref 22–32)
Chloride: 107 mmol/L (ref 98–111)
Creatinine, Ser: 1.04 mg/dL — ABNORMAL HIGH (ref 0.44–1.00)
GFR calc Af Amer: 52 mL/min — ABNORMAL LOW (ref >60)
GFR calc non Af Amer: 45 mL/min — ABNORMAL LOW (ref >60)
Glucose, Bld: 103 mg/dL — ABNORMAL HIGH (ref 70–99)
Potassium: 4.1 mmol/L (ref 3.5–5.1)
Sodium: 139 mmol/L (ref 135–145)

## 2018-03-11 LAB — MRSA PCR SCREENING: MRSA by PCR: NEGATIVE

## 2018-03-11 MED ORDER — OSELTAMIVIR PHOSPHATE 30 MG PO CAPS: 30.0000 mg | ORAL_CAPSULE | Freq: Every day | ORAL | 0 refills | Status: AC

## 2018-03-11 NOTE — Clinical Social Work Note (Signed)
LCSW spoke with Deborah Farrell at [facility] and advised of discharge. Discharge clinicals were sent to facility. Patient was in observation for less than 24 hours, therefore no new FL2 was completed.   Niece, Izora Gala advised of discharge and advised that her husband would transport patient back to the facility.   LCSW signing off.      Nellie Pester, Clydene Pugh, LCSW

## 2018-03-11 NOTE — Discharge Summary (Signed)
Physician Discharge Summary  Deborah Farrell:[id number] DOB: [birth year] DOA: 03/10/2018  PCP: Celene Squibb, MD  Admit date: 03/10/2018  Discharge date: 03/11/2018  Admitted From: SNF  Disposition: SNF  Recommendations for Outpatient Follow-up:  1. Follow up with PCP in 1-2 weeks 2. Continue on Tamiflu for 4 more days  Home Health: Continue PT at facility  Equipment/Devices: None  Discharge Condition: Stable  CODE STATUS: DNR  Diet recommendation: Heart Healthy  Brief/Interim Summary: Per HPI: Deborah Farrell is a 85 y.o. female with medical history significant for dementia, HTN, IBS, diverticulosis, who presented to the ED after she fell today.  She reports she fell because of the Rollator.  She tells me she hit her head when she fell. She denies chest pain or difficulty breathing.  She is able to answer a few questions but not give me a lot of details.  She reports generalized weakness.  She denies cough, she is not positive on new loose stools or vomiting, she denies burning with urination.  No abdominal pain.  Patient was admitted with generalized weakness secondary to influenza.  She has ambulated with PT and is no longer hypotensive or symptomatic.  She has been hydrated with potassium repletion as well.  She is otherwise doing much better this morning and has been started on Tamiflu for which she will need 5 full days of treatment.  She is no longer febrile and stable for discharge.  No other acute events noted during this hospitalization.  Discharge Diagnoses:  Active Problems:   Generalized weakness  Principal discharge diagnosis: Generalized weakness with fall secondary to influenza.  Discharge Instructions  Discharge Instructions    Diet - low sodium heart healthy   Complete by:  As directed    Increase activity slowly   Complete by:  As directed      Allergies as of 03/11/2018      Reactions   Tramadol Anaphylaxis   Aciphex [rabeprazole] Other (See  Comments)   intolerance   Colchicine    Dicyclomine Hcl Other (See Comments)   Unknown   Hyoscyamine Sulfate Other (See Comments)   Unknown   Iodinated Diagnostic Agents    Nexium [esomeprazole Magnesium] Other (See Comments)   G.I. Upset   Omeprazole Other (See Comments)   GI upset   Sulfonamide Derivatives Other (See Comments)   Unknown      Medication List    TAKE these medications   ALIGN PO Take 1 tablet by mouth daily.   ALPRAZolam 0.5 MG tablet Commonly known as:  XANAX Take 1 tablet (0.5 mg total) by mouth at bedtime as needed for anxiety. What changed:  when to take this   amLODipine 5 MG tablet Commonly known as:  NORVASC Take 5 mg by mouth daily.   BIOFREEZE EX Apply 1 application topically 3 (three) times daily. Applied to toes bilateral feet   CENTRUM SILVER PO Take 1 tablet by mouth daily.   ENSURE NUTRITION SHAKE Liqd Take 237 mLs by mouth daily.   FUSION PLUS PO Take 1 capsule by mouth daily.   latanoprost 0.005 % ophthalmic solution Commonly known as:  XALATAN Place 1 drop into both eyes at bedtime.   memantine 5 MG tablet Commonly known as:  NAMENDA Take 5 mg by mouth 2 (two) times daily.   MYRBETRIQ 25 MG Tb24 tablet Generic drug:  mirabegron ER Take 25 mg by mouth at bedtime.   olmesartan 20 MG tablet Commonly known as:  BENICAR Take  20 mg by mouth daily.   oseltamivir 30 MG capsule Commonly known as:  TAMIFLU Take 1 capsule (30 mg total) by mouth daily at 8 pm for 4 days.   pantoprazole 40 MG tablet Commonly known as:  PROTONIX TAKE 1 TABLET BY MOUTH DAILY   prochlorperazine 5 MG tablet Commonly known as:  COMPAZINE TAKE 1 TABLET BY MOUTH EVERY 6 HOURS AS NEEDED FOR NAUSEA OR VOMITING What changed:    how to take this  when to take this  reasons to take this  additional instructions      Follow-up Information    Celene Squibb, MD Follow up in 1 week(s).   Specialty:  Internal Medicine Contact information: [address] [place] [facility] [address] [phone]        Arnoldo Lenis, MD .   Specialty:  Cardiology Contact information: [place] [address] [phone]          Allergies  Allergen Reactions  . Tramadol Anaphylaxis  . Aciphex [Rabeprazole] Other (See Comments)    intolerance  . Colchicine   . Dicyclomine Hcl Other (See Comments)    Unknown  . Hyoscyamine Sulfate Other (See Comments)    Unknown  . Iodinated Diagnostic Agents   . Nexium [Esomeprazole Magnesium] Other (See Comments)    G.I. Upset  . Omeprazole Other (See Comments)    GI upset  . Sulfonamide Derivatives Other (See Comments)    Unknown    Consultations:  None   Procedures/Studies: Ct Head Wo Contrast  Result Date: 03/10/2018 CLINICAL DATA:  Unwitnessed fall EXAM: CT HEAD WITHOUT CONTRAST TECHNIQUE: Contiguous axial images were obtained from the base of the skull through the vertex without intravenous contrast. COMPARISON:  12/24/2005 FINDINGS: Brain: There is atrophy and chronic small vessel disease changes. No acute intracranial abnormality. Specifically, no hemorrhage, hydrocephalus, mass lesion, acute infarction, or significant intracranial injury. Vascular: No hyperdense vessel or unexpected calcification. Skull: No acute calvarial abnormality. Sinuses/Orbits: Visualized paranasal sinuses and mastoids clear. Orbital soft tissues unremarkable. Other: None IMPRESSION: Atrophy, chronic microvascular disease. No acute intracranial abnormality. Electronically Signed   By: Rolm Baptise M.D.   On: 03/10/2018 18:47   Dg Chest Port 1 View  Result Date: 03/10/2018 CLINICAL DATA:  85 year old female with weakness EXAM: PORTABLE CHEST 1 VIEW COMPARISON:  02/05/2018 FINDINGS: Cardiomediastinal silhouette unchanged in size and contour. No evidence of central vascular congestion. No interlobular septal thickening. Low lung volumes persist. No evidence pneumothorax or pleural effusion. No  confluent airspace disease. Coarsened interstitial markings similar to prior. IMPRESSION: Chronic lung changes without evidence of acute cardiopulmonary disease Electronically Signed   By: Corrie Mckusick D.O.   On: 03/10/2018 13:48     Discharge Exam: Vitals:   03/10/18 2102 03/11/18 0729  BP:  (!) 146/79  Pulse:  74  Resp:  16  Temp:  98.5 F (36.9 C)  SpO2: 96% 98%   Vitals:   03/10/18 1830 03/10/18 1930 03/10/18 2102 03/11/18 0729  BP: 129/71 137/63  (!) 146/79  Pulse: 67 70  74  Resp: 16 18  16   Temp:  98.1 F (36.7 C)  98.5 F (36.9 C)  TempSrc:  Oral  Oral  SpO2: 95% 97% 96% 98%  Weight:  61.7 kg    Height:  5\' 2"  (1.575 m)      General: Pt is alert, awake, not in acute distress Cardiovascular: RRR, S1/S2 +, no rubs, no gallops Respiratory: CTA bilaterally, no wheezing,  no rhonchi Abdominal: Soft, NT, ND, bowel sounds + Extremities: no edema, no cyanosis    The results of significant diagnostics from this hospitalization (including imaging, microbiology, ancillary and laboratory) are listed below for reference.     Microbiology: Recent Results (from the past 240 hour(s))  Blood Culture (routine x 2)     Status: None (Preliminary result)   Collection Time: 03/10/18  1:10 PM  Result Value Ref Range Status   Specimen Description BLOOD BLOOD LEFT HAND  Final   Special Requests   Final    BOTTLES DRAWN AEROBIC AND ANAEROBIC Blood Culture adequate volume   Culture   Final    NO GROWTH < 24 HOURS Performed at [facility], [address], [place], [place] [address]    Report Status PENDING  Incomplete  Blood Culture (routine x 2)     Status: None (Preliminary result)   Collection Time: 03/10/18  1:25 PM  Result Value Ref Range Status   Specimen Description BLOOD BLOOD LEFT HAND  Final   Special Requests   Final    Blood Culture adequate volume BOTTLES DRAWN AEROBIC AND ANAEROBIC   Culture   Final    NO GROWTH < 24 HOURS Performed at [facility], [address], [place], [place] [address]    Report Status PENDING  Incomplete  MRSA PCR Screening     Status: None   Collection Time: 03/10/18 11:13 PM  Result Value Ref Range Status   MRSA by PCR NEGATIVE NEGATIVE Final    Comment:        The GeneXpert MRSA Assay (FDA approved for NASAL specimens only), is one component of a comprehensive MRSA colonization surveillance program. It is not intended to diagnose MRSA infection nor to guide or monitor treatment for MRSA infections. Performed at [facility], [address] [place], [place] [address]      Labs: BNP (last 3 results) No results for input(s): BNP in the last 8760 hours. Basic Metabolic Panel: Recent Labs  Lab 03/10/18 1313 03/11/18 0540  NA 132* 139  K 3.7 4.1  CL 99 107  CO2 23 24  GLUCOSE 116* 103*  BUN 23 20  CREATININE 1.23* 1.04*  CALCIUM 8.5* 8.6*   Liver Function Tests: Recent Labs  Lab 03/10/18 1313  AST 30  ALT 27  ALKPHOS 55  BILITOT 0.8  PROT 6.9  ALBUMIN 3.6   No results for input(s): LIPASE, AMYLASE in the last 168 hours. No results for input(s): AMMONIA in the last 168 hours. CBC: Recent Labs  Lab 03/10/18 1313 03/11/18 0540  WBC 10.0 5.8  NEUTROABS 7.5  --   HGB 10.3* 9.5*  HCT 33.0* 30.5*  MCV 95.9 96.8  PLT 182 183   Cardiac Enzymes: No results for input(s): CKTOTAL, CKMB, CKMBINDEX, TROPONINI in the last 168 hours. BNP: Invalid input(s): POCBNP CBG: Recent Labs  Lab 03/10/18 1245  GLUCAP 119*   D-Dimer No results for input(s): DDIMER in the last 72 hours. Hgb A1c No results for input(s): HGBA1C in the last 72 hours. Lipid Profile No results for input(s): CHOL, HDL, LDLCALC, TRIG, CHOLHDL, LDLDIRECT in the last 72 hours. Thyroid function studies No results for input(s): TSH, T4TOTAL, T3FREE, THYROIDAB in the last 72 hours.  Invalid input(s): FREET3 Anemia work up No results for input(s): VITAMINB12, FOLATE, FERRITIN, TIBC, IRON, RETICCTPCT in the last 72  hours. Urinalysis    Component Value Date/Time   COLORURINE YELLOW 03/10/2018 1242   APPEARANCEUR HAZY (A) 03/10/2018  1242   LABSPEC 1.010 03/10/2018 1242   PHURINE 6.0 03/10/2018 1242   GLUCOSEU NEGATIVE 03/10/2018 1242   HGBUR NEGATIVE 03/10/2018 1242   BILIRUBINUR NEGATIVE 03/10/2018 1242   KETONESUR NEGATIVE 03/10/2018 1242   PROTEINUR NEGATIVE 03/10/2018 1242   UROBILINOGEN 1.0 11/02/2011 2208   NITRITE NEGATIVE 03/10/2018 [place] 03/10/2018 1242   Sepsis Labs Invalid input(s): PROCALCITONIN,  WBC,  LACTICIDVEN Microbiology Recent Results (from the past 240 hour(s))  Blood Culture (routine x 2)     Status: None (Preliminary result)   Collection Time: 03/10/18  1:10 PM  Result Value Ref Range Status   Specimen Description BLOOD BLOOD LEFT HAND  Final   Special Requests   Final    BOTTLES DRAWN AEROBIC AND ANAEROBIC Blood Culture adequate volume   Culture   Final    NO GROWTH < 24 HOURS Performed at [facility], [address] [place], [place] [address]    Report Status PENDING  Incomplete  Blood Culture (routine x 2)     Status: None (Preliminary result)   Collection Time: 03/10/18  1:25 PM  Result Value Ref Range Status   Specimen Description BLOOD BLOOD LEFT HAND  Final   Special Requests   Final    Blood Culture adequate volume BOTTLES DRAWN AEROBIC AND ANAEROBIC   Culture   Final    NO GROWTH < 24 HOURS Performed at [facility], [address] [place], [place] [address]    Report Status PENDING  Incomplete  MRSA PCR Screening     Status: None   Collection Time: 03/10/18 11:13 PM  Result Value Ref Range Status   MRSA by PCR NEGATIVE NEGATIVE Final    Comment:        The GeneXpert MRSA Assay (FDA approved for NASAL specimens only), is one component of a comprehensive MRSA colonization surveillance program. It is not intended to diagnose MRSA infection nor to guide or monitor treatment for MRSA infections. Performed at [facility], [address] [place], [place] [address]      Time coordinating discharge: 35 minutes  SIGNED:   Rodena Goldmann, DO [facility] 03/11/2018, 10:40 AM  If 7PM-7AM, please contact night-coverage [email or web address] Password [email or web address]

## 2018-03-11 NOTE — Progress Notes (Signed)
DC instructions given to pt's son Deborah Farrell, med changes, activity, diet and followup appointments. All questions answered. DC to SNF via [facility] / car, no distress noted.

## 2018-03-11 NOTE — Care Management (Signed)
Pt from [facility] with flue and weakness. Recommended for [facility] PT. Pt currently active with [facility]. Pt observation. No resumption orders needed.

## 2018-03-11 NOTE — Evaluation (Signed)
Physical Therapy Evaluation Patient Details Name: Deborah Farrell MRN: [id number] DOB: [birth year] Today's Date: 03/11/2018   History of Present Illness  Deborah Farrell is a 85 y.o. female with medical history significant for dementia, HTN, IBS, diverticulosis, who presented to the ED after she fell today.  She reports she fell because of the Rollator.  She tells me she hit her head when she fell. She denies chest pain or difficulty breathing.  She is able to answer a few questions but not give me a lot of details.  She reports generalized weakness.  She denies cough, she is not positive on new loose stools or vomiting, she denies burning with urination.  No abdominal pain.    Clinical Impression  Patient functioning near baseline for functional mobility and gait other than required increased time and assistance for sitting up at bedside due to generalized weakness, able to ambulate in room/hallway without loss of balance, requires increased time when making turns using RW, limited secondary to c/o weakness in knees and tolerated sitting up in chair after therapy.  Patient will benefit from continued physical therapy in hospital and recommended venue below to increase strength, balance, endurance for safe ADLs and gait.     Follow Up Recommendations Home health PT;Supervision for mobility/OOB;Supervision - Intermittent;Other (comment)(return to ALF)    Equipment Recommendations  None recommended by PT    Recommendations for Other Services       Precautions / Restrictions Precautions Precautions: Fall Restrictions Weight Bearing Restrictions: No      Mobility  Bed Mobility Overal bed mobility: Needs Assistance Bed Mobility: Supine to Sit     Supine to sit: Min assist;Mod assist     General bed mobility comments: slow labored movement  Transfers Overall transfer level: Needs assistance Equipment used: Rolling walker (2 wheeled) Transfers: Sit to/from [employer] Sit to Stand: Min guard;Min assist Stand pivot transfers: Min guard;Min assist       General transfer comment: labored movement without loss of balance  Ambulation/Gait Ambulation/Gait assistance: Min assist;Min guard Gait Distance (Feet): 75 Feet Assistive device: Rolling walker (2 wheeled) Gait Pattern/deviations: Decreased step length - right;Decreased step length - left;Decreased stride length Gait velocity: decreased   General Gait Details: slow slightly labored cadence without loss of balance, limited secondary to c/o feeling weakness in knees  Stairs            Wheelchair Mobility    Modified Rankin (Stroke Patients Only)       Balance Overall balance assessment: Needs assistance Sitting-balance support: Feet supported;No upper extremity supported Sitting balance-Leahy Scale: Good     Standing balance support: Bilateral upper extremity supported;During functional activity Standing balance-Leahy Scale: Fair Standing balance comment: using RW                             Pertinent Vitals/Pain Pain Assessment: No/denies pain    Home Living Family/patient expects to be discharged to:: Assisted living               Home Equipment: Walker - 4 wheels;Shower seat - built in;Cane - single point      Prior Function Level of Independence: Needs assistance   Gait / Transfers Assistance Needed: Supervised household ambulation with Rollator  ADL's / Homemaking Assistance Needed: assisted by ALF staff        Hand Dominance        Extremity/Trunk Assessment   Upper Extremity Assessment  Upper Extremity Assessment: Generalized weakness    Lower Extremity Assessment Lower Extremity Assessment: Generalized weakness    Cervical / Trunk Assessment Cervical / Trunk Assessment: Normal  Communication   Communication: No difficulties  Cognition Arousal/Alertness: Awake/alert Behavior During Therapy: WFL for tasks  assessed/performed Overall Cognitive Status: Within Functional Limits for tasks assessed                                        General Comments      Exercises     Assessment/Plan    PT Assessment Patient needs continued PT services  PT Problem List Decreased strength;Decreased activity tolerance;Decreased balance;Decreased mobility       PT Treatment Interventions Gait training;Stair training;Functional mobility training;Therapeutic activities;Patient/family education;Therapeutic exercise    PT Goals (Current goals can be found in the Care Plan section)  Acute Rehab PT Goals Patient Stated Goal: return to ALF with assistance PT Goal Formulation: With patient Time For Goal Achievement: 03/18/18 Potential to Achieve Goals: Good    Frequency Min 3X/week   Barriers to discharge        Co-evaluation               AM-PAC PT "6 Clicks" Mobility  Outcome Measure Help needed turning from your back to your side while in a flat bed without using bedrails?: A Little Help needed moving from lying on your back to sitting on the side of a flat bed without using bedrails?: A Lot Help needed moving to and from a bed to a chair (including a wheelchair)?: A Little Help needed standing up from a chair using your arms (e.g., wheelchair or bedside chair)?: A Little Help needed to walk in hospital room?: A Little Help needed climbing 3-5 steps with a railing? : A Lot 6 Click Score: 16    End of Session   Activity Tolerance: Patient tolerated treatment well;Patient limited by fatigue Patient left: in chair;with call bell/phone within reach Nurse Communication: Mobility status PT Visit Diagnosis: Unsteadiness on feet (R26.81);Other abnormalities of gait and mobility (R26.89);Muscle weakness (generalized) (M62.81)    Time: 4174-0814 PT Time Calculation (min) (ACUTE ONLY): 32 min   Charges:   PT Evaluation $PT Eval Moderate Complexity: 1 Mod PT Treatments $Gait  Training: 23-37 mins        8:51 AM, 03/11/18 Lonell Grandchild, MPT Physical Therapist with [facility] [phone] office [phone] mobile phone

## 2018-03-11 NOTE — Plan of Care (Signed)
  Problem: Acute Rehab PT Goals(only PT should resolve) Goal: Pt Will Go Supine/Side To Sit Outcome: Progressing Flowsheets (Taken 03/11/2018 0853) Pt will go Supine/Side to Sit: with min guard assist Goal: Patient Will Transfer Sit To/From Stand Outcome: Progressing Flowsheets (Taken 03/11/2018 [phone]) Patient will transfer sit to/from stand: with supervision Goal: Pt Will Transfer Bed To Chair/Chair To Bed Outcome: Progressing Flowsheets (Taken 03/11/2018 0853) Pt will Transfer Bed to Chair/Chair to Bed: with supervision Goal: Pt Will Ambulate Outcome: Progressing Flowsheets (Taken 03/11/2018 0853) Pt will Ambulate: 100 feet; with supervision; with rolling walker   8:53 AM, 03/11/18 Lonell Grandchild, MPT Physical Therapist with [facility] [phone] office [phone] mobile phone

## 2018-03-12 DIAGNOSIS — Z9181 History of falling: Secondary | ICD-10-CM | POA: Diagnosis not present

## 2018-03-12 DIAGNOSIS — K5289 Other specified noninfective gastroenteritis and colitis: Secondary | ICD-10-CM | POA: Diagnosis not present

## 2018-03-12 DIAGNOSIS — Z90710 Acquired absence of both cervix and uterus: Secondary | ICD-10-CM | POA: Diagnosis not present

## 2018-03-12 DIAGNOSIS — I10 Essential (primary) hypertension: Secondary | ICD-10-CM | POA: Diagnosis not present

## 2018-03-12 DIAGNOSIS — E785 Hyperlipidemia, unspecified: Secondary | ICD-10-CM | POA: Diagnosis not present

## 2018-03-12 DIAGNOSIS — K219 Gastro-esophageal reflux disease without esophagitis: Secondary | ICD-10-CM | POA: Diagnosis not present

## 2018-03-12 DIAGNOSIS — K5780 Diverticulitis of intestine, part unspecified, with perforation and abscess without bleeding: Secondary | ICD-10-CM | POA: Diagnosis not present

## 2018-03-12 DIAGNOSIS — H409 Unspecified glaucoma: Secondary | ICD-10-CM | POA: Diagnosis not present

## 2018-03-12 DIAGNOSIS — Z9049 Acquired absence of other specified parts of digestive tract: Secondary | ICD-10-CM | POA: Diagnosis not present

## 2018-03-13 DIAGNOSIS — Z9049 Acquired absence of other specified parts of digestive tract: Secondary | ICD-10-CM | POA: Diagnosis not present

## 2018-03-13 DIAGNOSIS — Z90710 Acquired absence of both cervix and uterus: Secondary | ICD-10-CM | POA: Diagnosis not present

## 2018-03-13 DIAGNOSIS — K5289 Other specified noninfective gastroenteritis and colitis: Secondary | ICD-10-CM | POA: Diagnosis not present

## 2018-03-13 DIAGNOSIS — I10 Essential (primary) hypertension: Secondary | ICD-10-CM | POA: Diagnosis not present

## 2018-03-13 DIAGNOSIS — Z9181 History of falling: Secondary | ICD-10-CM | POA: Diagnosis not present

## 2018-03-13 DIAGNOSIS — H409 Unspecified glaucoma: Secondary | ICD-10-CM | POA: Diagnosis not present

## 2018-03-13 DIAGNOSIS — K219 Gastro-esophageal reflux disease without esophagitis: Secondary | ICD-10-CM | POA: Diagnosis not present

## 2018-03-13 DIAGNOSIS — E785 Hyperlipidemia, unspecified: Secondary | ICD-10-CM | POA: Diagnosis not present

## 2018-03-13 DIAGNOSIS — K5780 Diverticulitis of intestine, part unspecified, with perforation and abscess without bleeding: Secondary | ICD-10-CM | POA: Diagnosis not present

## 2018-03-13 LAB — URINE CULTURE: Culture: >=100000 — AB

## 2018-03-15 LAB — CULTURE, BLOOD (ROUTINE X 2)
Culture: NO GROWTH
Culture: NO GROWTH
Special Requests: ADEQUATE
Special Requests: ADEQUATE

## 2018-03-16 DIAGNOSIS — R05 Cough: Secondary | ICD-10-CM | POA: Diagnosis not present

## 2018-03-17 DIAGNOSIS — K219 Gastro-esophageal reflux disease without esophagitis: Secondary | ICD-10-CM | POA: Diagnosis not present

## 2018-03-17 DIAGNOSIS — K5780 Diverticulitis of intestine, part unspecified, with perforation and abscess without bleeding: Secondary | ICD-10-CM | POA: Diagnosis not present

## 2018-03-17 DIAGNOSIS — K5289 Other specified noninfective gastroenteritis and colitis: Secondary | ICD-10-CM | POA: Diagnosis not present

## 2018-03-17 DIAGNOSIS — Z9181 History of falling: Secondary | ICD-10-CM | POA: Diagnosis not present

## 2018-03-17 DIAGNOSIS — Z90710 Acquired absence of both cervix and uterus: Secondary | ICD-10-CM | POA: Diagnosis not present

## 2018-03-17 DIAGNOSIS — E785 Hyperlipidemia, unspecified: Secondary | ICD-10-CM | POA: Diagnosis not present

## 2018-03-17 DIAGNOSIS — Z9049 Acquired absence of other specified parts of digestive tract: Secondary | ICD-10-CM | POA: Diagnosis not present

## 2018-03-17 DIAGNOSIS — I10 Essential (primary) hypertension: Secondary | ICD-10-CM | POA: Diagnosis not present

## 2018-03-17 DIAGNOSIS — H409 Unspecified glaucoma: Secondary | ICD-10-CM | POA: Diagnosis not present

## 2018-03-19 DIAGNOSIS — E785 Hyperlipidemia, unspecified: Secondary | ICD-10-CM | POA: Diagnosis not present

## 2018-03-19 DIAGNOSIS — I10 Essential (primary) hypertension: Secondary | ICD-10-CM | POA: Diagnosis not present

## 2018-03-19 DIAGNOSIS — K5289 Other specified noninfective gastroenteritis and colitis: Secondary | ICD-10-CM | POA: Diagnosis not present

## 2018-03-19 DIAGNOSIS — Z9049 Acquired absence of other specified parts of digestive tract: Secondary | ICD-10-CM | POA: Diagnosis not present

## 2018-03-19 DIAGNOSIS — H409 Unspecified glaucoma: Secondary | ICD-10-CM | POA: Diagnosis not present

## 2018-03-19 DIAGNOSIS — K5780 Diverticulitis of intestine, part unspecified, with perforation and abscess without bleeding: Secondary | ICD-10-CM | POA: Diagnosis not present

## 2018-03-19 DIAGNOSIS — Z90710 Acquired absence of both cervix and uterus: Secondary | ICD-10-CM | POA: Diagnosis not present

## 2018-03-19 DIAGNOSIS — Z9181 History of falling: Secondary | ICD-10-CM | POA: Diagnosis not present

## 2018-03-19 DIAGNOSIS — K219 Gastro-esophageal reflux disease without esophagitis: Secondary | ICD-10-CM | POA: Diagnosis not present

## 2018-03-23 DIAGNOSIS — D509 Iron deficiency anemia, unspecified: Secondary | ICD-10-CM | POA: Diagnosis not present

## 2018-03-23 DIAGNOSIS — N3281 Overactive bladder: Secondary | ICD-10-CM | POA: Diagnosis not present

## 2018-03-23 DIAGNOSIS — Z6822 Body mass index (BMI) 22.0-22.9, adult: Secondary | ICD-10-CM | POA: Diagnosis not present

## 2018-03-23 DIAGNOSIS — I10 Essential (primary) hypertension: Secondary | ICD-10-CM | POA: Diagnosis not present

## 2018-03-25 ENCOUNTER — Encounter: Payer: Self-pay | Admitting: Gastroenterology

## 2018-03-26 ENCOUNTER — Other Ambulatory Visit: Payer: Self-pay | Admitting: Cardiology

## 2018-03-26 DIAGNOSIS — H409 Unspecified glaucoma: Secondary | ICD-10-CM | POA: Diagnosis not present

## 2018-03-26 DIAGNOSIS — K5780 Diverticulitis of intestine, part unspecified, with perforation and abscess without bleeding: Secondary | ICD-10-CM | POA: Diagnosis not present

## 2018-03-26 DIAGNOSIS — E785 Hyperlipidemia, unspecified: Secondary | ICD-10-CM | POA: Diagnosis not present

## 2018-03-26 DIAGNOSIS — Z9181 History of falling: Secondary | ICD-10-CM | POA: Diagnosis not present

## 2018-03-26 DIAGNOSIS — Z90710 Acquired absence of both cervix and uterus: Secondary | ICD-10-CM | POA: Diagnosis not present

## 2018-03-26 DIAGNOSIS — Z9049 Acquired absence of other specified parts of digestive tract: Secondary | ICD-10-CM | POA: Diagnosis not present

## 2018-03-26 DIAGNOSIS — K5289 Other specified noninfective gastroenteritis and colitis: Secondary | ICD-10-CM | POA: Diagnosis not present

## 2018-03-26 DIAGNOSIS — K219 Gastro-esophageal reflux disease without esophagitis: Secondary | ICD-10-CM | POA: Diagnosis not present

## 2018-03-26 DIAGNOSIS — I10 Essential (primary) hypertension: Secondary | ICD-10-CM | POA: Diagnosis not present

## 2018-03-31 DIAGNOSIS — I10 Essential (primary) hypertension: Secondary | ICD-10-CM | POA: Diagnosis not present

## 2018-03-31 DIAGNOSIS — Z90710 Acquired absence of both cervix and uterus: Secondary | ICD-10-CM | POA: Diagnosis not present

## 2018-03-31 DIAGNOSIS — K5289 Other specified noninfective gastroenteritis and colitis: Secondary | ICD-10-CM | POA: Diagnosis not present

## 2018-03-31 DIAGNOSIS — K5780 Diverticulitis of intestine, part unspecified, with perforation and abscess without bleeding: Secondary | ICD-10-CM | POA: Diagnosis not present

## 2018-03-31 DIAGNOSIS — H409 Unspecified glaucoma: Secondary | ICD-10-CM | POA: Diagnosis not present

## 2018-03-31 DIAGNOSIS — E785 Hyperlipidemia, unspecified: Secondary | ICD-10-CM | POA: Diagnosis not present

## 2018-03-31 DIAGNOSIS — K219 Gastro-esophageal reflux disease without esophagitis: Secondary | ICD-10-CM | POA: Diagnosis not present

## 2018-03-31 DIAGNOSIS — Z9049 Acquired absence of other specified parts of digestive tract: Secondary | ICD-10-CM | POA: Diagnosis not present

## 2018-03-31 DIAGNOSIS — Z9181 History of falling: Secondary | ICD-10-CM | POA: Diagnosis not present

## 2018-04-02 DIAGNOSIS — K5780 Diverticulitis of intestine, part unspecified, with perforation and abscess without bleeding: Secondary | ICD-10-CM | POA: Diagnosis not present

## 2018-04-02 DIAGNOSIS — Z9181 History of falling: Secondary | ICD-10-CM | POA: Diagnosis not present

## 2018-04-02 DIAGNOSIS — H409 Unspecified glaucoma: Secondary | ICD-10-CM | POA: Diagnosis not present

## 2018-04-02 DIAGNOSIS — Z9049 Acquired absence of other specified parts of digestive tract: Secondary | ICD-10-CM | POA: Diagnosis not present

## 2018-04-02 DIAGNOSIS — I10 Essential (primary) hypertension: Secondary | ICD-10-CM | POA: Diagnosis not present

## 2018-04-02 DIAGNOSIS — Z90710 Acquired absence of both cervix and uterus: Secondary | ICD-10-CM | POA: Diagnosis not present

## 2018-04-02 DIAGNOSIS — K219 Gastro-esophageal reflux disease without esophagitis: Secondary | ICD-10-CM | POA: Diagnosis not present

## 2018-04-02 DIAGNOSIS — E785 Hyperlipidemia, unspecified: Secondary | ICD-10-CM | POA: Diagnosis not present

## 2018-04-02 DIAGNOSIS — K5289 Other specified noninfective gastroenteritis and colitis: Secondary | ICD-10-CM | POA: Diagnosis not present

## 2018-04-07 DIAGNOSIS — Z90710 Acquired absence of both cervix and uterus: Secondary | ICD-10-CM | POA: Diagnosis not present

## 2018-04-07 DIAGNOSIS — Z9181 History of falling: Secondary | ICD-10-CM | POA: Diagnosis not present

## 2018-04-07 DIAGNOSIS — E785 Hyperlipidemia, unspecified: Secondary | ICD-10-CM | POA: Diagnosis not present

## 2018-04-07 DIAGNOSIS — K5289 Other specified noninfective gastroenteritis and colitis: Secondary | ICD-10-CM | POA: Diagnosis not present

## 2018-04-07 DIAGNOSIS — H409 Unspecified glaucoma: Secondary | ICD-10-CM | POA: Diagnosis not present

## 2018-04-07 DIAGNOSIS — Z9049 Acquired absence of other specified parts of digestive tract: Secondary | ICD-10-CM | POA: Diagnosis not present

## 2018-04-07 DIAGNOSIS — K219 Gastro-esophageal reflux disease without esophagitis: Secondary | ICD-10-CM | POA: Diagnosis not present

## 2018-04-07 DIAGNOSIS — I10 Essential (primary) hypertension: Secondary | ICD-10-CM | POA: Diagnosis not present

## 2018-04-07 DIAGNOSIS — K5780 Diverticulitis of intestine, part unspecified, with perforation and abscess without bleeding: Secondary | ICD-10-CM | POA: Diagnosis not present

## 2018-04-09 DIAGNOSIS — E785 Hyperlipidemia, unspecified: Secondary | ICD-10-CM | POA: Diagnosis not present

## 2018-04-09 DIAGNOSIS — I10 Essential (primary) hypertension: Secondary | ICD-10-CM | POA: Diagnosis not present

## 2018-04-09 DIAGNOSIS — Z9049 Acquired absence of other specified parts of digestive tract: Secondary | ICD-10-CM | POA: Diagnosis not present

## 2018-04-09 DIAGNOSIS — Z9181 History of falling: Secondary | ICD-10-CM | POA: Diagnosis not present

## 2018-04-09 DIAGNOSIS — K5289 Other specified noninfective gastroenteritis and colitis: Secondary | ICD-10-CM | POA: Diagnosis not present

## 2018-04-09 DIAGNOSIS — Z90710 Acquired absence of both cervix and uterus: Secondary | ICD-10-CM | POA: Diagnosis not present

## 2018-04-09 DIAGNOSIS — H409 Unspecified glaucoma: Secondary | ICD-10-CM | POA: Diagnosis not present

## 2018-04-09 DIAGNOSIS — K5780 Diverticulitis of intestine, part unspecified, with perforation and abscess without bleeding: Secondary | ICD-10-CM | POA: Diagnosis not present

## 2018-04-09 DIAGNOSIS — K219 Gastro-esophageal reflux disease without esophagitis: Secondary | ICD-10-CM | POA: Diagnosis not present

## 2018-04-14 DIAGNOSIS — Z90710 Acquired absence of both cervix and uterus: Secondary | ICD-10-CM | POA: Diagnosis not present

## 2018-04-14 DIAGNOSIS — H409 Unspecified glaucoma: Secondary | ICD-10-CM | POA: Diagnosis not present

## 2018-04-14 DIAGNOSIS — K5780 Diverticulitis of intestine, part unspecified, with perforation and abscess without bleeding: Secondary | ICD-10-CM | POA: Diagnosis not present

## 2018-04-14 DIAGNOSIS — K219 Gastro-esophageal reflux disease without esophagitis: Secondary | ICD-10-CM | POA: Diagnosis not present

## 2018-04-14 DIAGNOSIS — Z9181 History of falling: Secondary | ICD-10-CM | POA: Diagnosis not present

## 2018-04-14 DIAGNOSIS — K5289 Other specified noninfective gastroenteritis and colitis: Secondary | ICD-10-CM | POA: Diagnosis not present

## 2018-04-14 DIAGNOSIS — E785 Hyperlipidemia, unspecified: Secondary | ICD-10-CM | POA: Diagnosis not present

## 2018-04-14 DIAGNOSIS — Z9049 Acquired absence of other specified parts of digestive tract: Secondary | ICD-10-CM | POA: Diagnosis not present

## 2018-04-14 DIAGNOSIS — I10 Essential (primary) hypertension: Secondary | ICD-10-CM | POA: Diagnosis not present

## 2018-04-16 DIAGNOSIS — H409 Unspecified glaucoma: Secondary | ICD-10-CM | POA: Diagnosis not present

## 2018-04-16 DIAGNOSIS — K5780 Diverticulitis of intestine, part unspecified, with perforation and abscess without bleeding: Secondary | ICD-10-CM | POA: Diagnosis not present

## 2018-04-16 DIAGNOSIS — Z9181 History of falling: Secondary | ICD-10-CM | POA: Diagnosis not present

## 2018-04-16 DIAGNOSIS — I10 Essential (primary) hypertension: Secondary | ICD-10-CM | POA: Diagnosis not present

## 2018-04-16 DIAGNOSIS — Z9049 Acquired absence of other specified parts of digestive tract: Secondary | ICD-10-CM | POA: Diagnosis not present

## 2018-04-16 DIAGNOSIS — K5289 Other specified noninfective gastroenteritis and colitis: Secondary | ICD-10-CM | POA: Diagnosis not present

## 2018-04-16 DIAGNOSIS — Z90710 Acquired absence of both cervix and uterus: Secondary | ICD-10-CM | POA: Diagnosis not present

## 2018-04-16 DIAGNOSIS — K219 Gastro-esophageal reflux disease without esophagitis: Secondary | ICD-10-CM | POA: Diagnosis not present

## 2018-04-16 DIAGNOSIS — E785 Hyperlipidemia, unspecified: Secondary | ICD-10-CM | POA: Diagnosis not present

## 2018-04-21 DIAGNOSIS — K5289 Other specified noninfective gastroenteritis and colitis: Secondary | ICD-10-CM | POA: Diagnosis not present

## 2018-04-21 DIAGNOSIS — E785 Hyperlipidemia, unspecified: Secondary | ICD-10-CM | POA: Diagnosis not present

## 2018-04-21 DIAGNOSIS — I10 Essential (primary) hypertension: Secondary | ICD-10-CM | POA: Diagnosis not present

## 2018-04-21 DIAGNOSIS — H409 Unspecified glaucoma: Secondary | ICD-10-CM | POA: Diagnosis not present

## 2018-04-21 DIAGNOSIS — Z9181 History of falling: Secondary | ICD-10-CM | POA: Diagnosis not present

## 2018-04-21 DIAGNOSIS — Z9049 Acquired absence of other specified parts of digestive tract: Secondary | ICD-10-CM | POA: Diagnosis not present

## 2018-04-21 DIAGNOSIS — K5780 Diverticulitis of intestine, part unspecified, with perforation and abscess without bleeding: Secondary | ICD-10-CM | POA: Diagnosis not present

## 2018-04-21 DIAGNOSIS — Z90710 Acquired absence of both cervix and uterus: Secondary | ICD-10-CM | POA: Diagnosis not present

## 2018-04-21 DIAGNOSIS — K219 Gastro-esophageal reflux disease without esophagitis: Secondary | ICD-10-CM | POA: Diagnosis not present

## 2018-04-23 DIAGNOSIS — H409 Unspecified glaucoma: Secondary | ICD-10-CM | POA: Diagnosis not present

## 2018-04-23 DIAGNOSIS — Z90710 Acquired absence of both cervix and uterus: Secondary | ICD-10-CM | POA: Diagnosis not present

## 2018-04-23 DIAGNOSIS — E785 Hyperlipidemia, unspecified: Secondary | ICD-10-CM | POA: Diagnosis not present

## 2018-04-23 DIAGNOSIS — Z9049 Acquired absence of other specified parts of digestive tract: Secondary | ICD-10-CM | POA: Diagnosis not present

## 2018-04-23 DIAGNOSIS — K5780 Diverticulitis of intestine, part unspecified, with perforation and abscess without bleeding: Secondary | ICD-10-CM | POA: Diagnosis not present

## 2018-04-23 DIAGNOSIS — Z9181 History of falling: Secondary | ICD-10-CM | POA: Diagnosis not present

## 2018-04-23 DIAGNOSIS — I10 Essential (primary) hypertension: Secondary | ICD-10-CM | POA: Diagnosis not present

## 2018-04-23 DIAGNOSIS — K219 Gastro-esophageal reflux disease without esophagitis: Secondary | ICD-10-CM | POA: Diagnosis not present

## 2018-04-23 DIAGNOSIS — K5289 Other specified noninfective gastroenteritis and colitis: Secondary | ICD-10-CM | POA: Diagnosis not present

## 2018-04-29 DIAGNOSIS — E785 Hyperlipidemia, unspecified: Secondary | ICD-10-CM | POA: Diagnosis not present

## 2018-04-29 DIAGNOSIS — I10 Essential (primary) hypertension: Secondary | ICD-10-CM | POA: Diagnosis not present

## 2018-04-29 DIAGNOSIS — Z9181 History of falling: Secondary | ICD-10-CM | POA: Diagnosis not present

## 2018-04-29 DIAGNOSIS — Z90710 Acquired absence of both cervix and uterus: Secondary | ICD-10-CM | POA: Diagnosis not present

## 2018-04-29 DIAGNOSIS — Z9049 Acquired absence of other specified parts of digestive tract: Secondary | ICD-10-CM | POA: Diagnosis not present

## 2018-04-29 DIAGNOSIS — K5289 Other specified noninfective gastroenteritis and colitis: Secondary | ICD-10-CM | POA: Diagnosis not present

## 2018-04-29 DIAGNOSIS — K219 Gastro-esophageal reflux disease without esophagitis: Secondary | ICD-10-CM | POA: Diagnosis not present

## 2018-04-29 DIAGNOSIS — K5780 Diverticulitis of intestine, part unspecified, with perforation and abscess without bleeding: Secondary | ICD-10-CM | POA: Diagnosis not present

## 2018-04-29 DIAGNOSIS — H409 Unspecified glaucoma: Secondary | ICD-10-CM | POA: Diagnosis not present

## 2018-05-01 DIAGNOSIS — Z9049 Acquired absence of other specified parts of digestive tract: Secondary | ICD-10-CM | POA: Diagnosis not present

## 2018-05-01 DIAGNOSIS — E785 Hyperlipidemia, unspecified: Secondary | ICD-10-CM | POA: Diagnosis not present

## 2018-05-01 DIAGNOSIS — Z9181 History of falling: Secondary | ICD-10-CM | POA: Diagnosis not present

## 2018-05-01 DIAGNOSIS — K5289 Other specified noninfective gastroenteritis and colitis: Secondary | ICD-10-CM | POA: Diagnosis not present

## 2018-05-01 DIAGNOSIS — K219 Gastro-esophageal reflux disease without esophagitis: Secondary | ICD-10-CM | POA: Diagnosis not present

## 2018-05-01 DIAGNOSIS — K5780 Diverticulitis of intestine, part unspecified, with perforation and abscess without bleeding: Secondary | ICD-10-CM | POA: Diagnosis not present

## 2018-05-01 DIAGNOSIS — Z90710 Acquired absence of both cervix and uterus: Secondary | ICD-10-CM | POA: Diagnosis not present

## 2018-05-01 DIAGNOSIS — H409 Unspecified glaucoma: Secondary | ICD-10-CM | POA: Diagnosis not present

## 2018-05-01 DIAGNOSIS — I10 Essential (primary) hypertension: Secondary | ICD-10-CM | POA: Diagnosis not present

## 2018-05-05 DIAGNOSIS — Z9181 History of falling: Secondary | ICD-10-CM | POA: Diagnosis not present

## 2018-05-05 DIAGNOSIS — K219 Gastro-esophageal reflux disease without esophagitis: Secondary | ICD-10-CM | POA: Diagnosis not present

## 2018-05-05 DIAGNOSIS — E785 Hyperlipidemia, unspecified: Secondary | ICD-10-CM | POA: Diagnosis not present

## 2018-05-05 DIAGNOSIS — Z90710 Acquired absence of both cervix and uterus: Secondary | ICD-10-CM | POA: Diagnosis not present

## 2018-05-05 DIAGNOSIS — K5780 Diverticulitis of intestine, part unspecified, with perforation and abscess without bleeding: Secondary | ICD-10-CM | POA: Diagnosis not present

## 2018-05-05 DIAGNOSIS — K5289 Other specified noninfective gastroenteritis and colitis: Secondary | ICD-10-CM | POA: Diagnosis not present

## 2018-05-05 DIAGNOSIS — I10 Essential (primary) hypertension: Secondary | ICD-10-CM | POA: Diagnosis not present

## 2018-05-05 DIAGNOSIS — Z9049 Acquired absence of other specified parts of digestive tract: Secondary | ICD-10-CM | POA: Diagnosis not present

## 2018-05-05 DIAGNOSIS — H409 Unspecified glaucoma: Secondary | ICD-10-CM | POA: Diagnosis not present

## 2018-05-06 DIAGNOSIS — K219 Gastro-esophageal reflux disease without esophagitis: Secondary | ICD-10-CM | POA: Diagnosis not present

## 2018-05-06 DIAGNOSIS — Z90710 Acquired absence of both cervix and uterus: Secondary | ICD-10-CM | POA: Diagnosis not present

## 2018-05-06 DIAGNOSIS — I10 Essential (primary) hypertension: Secondary | ICD-10-CM | POA: Diagnosis not present

## 2018-05-06 DIAGNOSIS — E785 Hyperlipidemia, unspecified: Secondary | ICD-10-CM | POA: Diagnosis not present

## 2018-05-06 DIAGNOSIS — Z9049 Acquired absence of other specified parts of digestive tract: Secondary | ICD-10-CM | POA: Diagnosis not present

## 2018-05-06 DIAGNOSIS — H409 Unspecified glaucoma: Secondary | ICD-10-CM | POA: Diagnosis not present

## 2018-05-06 DIAGNOSIS — Z9181 History of falling: Secondary | ICD-10-CM | POA: Diagnosis not present

## 2018-05-06 DIAGNOSIS — K5780 Diverticulitis of intestine, part unspecified, with perforation and abscess without bleeding: Secondary | ICD-10-CM | POA: Diagnosis not present

## 2018-05-06 DIAGNOSIS — K5289 Other specified noninfective gastroenteritis and colitis: Secondary | ICD-10-CM | POA: Diagnosis not present

## 2018-05-08 DIAGNOSIS — Z0000 Encounter for general adult medical examination without abnormal findings: Secondary | ICD-10-CM | POA: Diagnosis not present

## 2018-05-14 DIAGNOSIS — E785 Hyperlipidemia, unspecified: Secondary | ICD-10-CM | POA: Diagnosis not present

## 2018-05-14 DIAGNOSIS — H409 Unspecified glaucoma: Secondary | ICD-10-CM | POA: Diagnosis not present

## 2018-05-14 DIAGNOSIS — K219 Gastro-esophageal reflux disease without esophagitis: Secondary | ICD-10-CM | POA: Diagnosis not present

## 2018-05-14 DIAGNOSIS — Z9181 History of falling: Secondary | ICD-10-CM | POA: Diagnosis not present

## 2018-05-14 DIAGNOSIS — Z90710 Acquired absence of both cervix and uterus: Secondary | ICD-10-CM | POA: Diagnosis not present

## 2018-05-14 DIAGNOSIS — Z9049 Acquired absence of other specified parts of digestive tract: Secondary | ICD-10-CM | POA: Diagnosis not present

## 2018-05-14 DIAGNOSIS — K5780 Diverticulitis of intestine, part unspecified, with perforation and abscess without bleeding: Secondary | ICD-10-CM | POA: Diagnosis not present

## 2018-05-14 DIAGNOSIS — K5289 Other specified noninfective gastroenteritis and colitis: Secondary | ICD-10-CM | POA: Diagnosis not present

## 2018-05-14 DIAGNOSIS — I10 Essential (primary) hypertension: Secondary | ICD-10-CM | POA: Diagnosis not present

## 2018-05-21 DIAGNOSIS — H409 Unspecified glaucoma: Secondary | ICD-10-CM | POA: Diagnosis not present

## 2018-05-21 DIAGNOSIS — K219 Gastro-esophageal reflux disease without esophagitis: Secondary | ICD-10-CM | POA: Diagnosis not present

## 2018-05-21 DIAGNOSIS — E785 Hyperlipidemia, unspecified: Secondary | ICD-10-CM | POA: Diagnosis not present

## 2018-05-21 DIAGNOSIS — K5780 Diverticulitis of intestine, part unspecified, with perforation and abscess without bleeding: Secondary | ICD-10-CM | POA: Diagnosis not present

## 2018-05-21 DIAGNOSIS — Z9049 Acquired absence of other specified parts of digestive tract: Secondary | ICD-10-CM | POA: Diagnosis not present

## 2018-05-21 DIAGNOSIS — Z90710 Acquired absence of both cervix and uterus: Secondary | ICD-10-CM | POA: Diagnosis not present

## 2018-05-21 DIAGNOSIS — K5289 Other specified noninfective gastroenteritis and colitis: Secondary | ICD-10-CM | POA: Diagnosis not present

## 2018-05-21 DIAGNOSIS — Z9181 History of falling: Secondary | ICD-10-CM | POA: Diagnosis not present

## 2018-05-21 DIAGNOSIS — I10 Essential (primary) hypertension: Secondary | ICD-10-CM | POA: Diagnosis not present

## 2018-05-25 DIAGNOSIS — H4010X4 Unspecified open-angle glaucoma, indeterminate stage: Secondary | ICD-10-CM | POA: Diagnosis not present

## 2018-05-25 DIAGNOSIS — D509 Iron deficiency anemia, unspecified: Secondary | ICD-10-CM | POA: Diagnosis not present

## 2018-05-25 DIAGNOSIS — E618 Deficiency of other specified nutrient elements: Secondary | ICD-10-CM | POA: Diagnosis not present

## 2018-05-25 DIAGNOSIS — I10 Essential (primary) hypertension: Secondary | ICD-10-CM | POA: Diagnosis not present

## 2018-05-26 DIAGNOSIS — I10 Essential (primary) hypertension: Secondary | ICD-10-CM | POA: Diagnosis not present

## 2018-05-26 DIAGNOSIS — Z9049 Acquired absence of other specified parts of digestive tract: Secondary | ICD-10-CM | POA: Diagnosis not present

## 2018-05-26 DIAGNOSIS — K219 Gastro-esophageal reflux disease without esophagitis: Secondary | ICD-10-CM | POA: Diagnosis not present

## 2018-05-26 DIAGNOSIS — K5780 Diverticulitis of intestine, part unspecified, with perforation and abscess without bleeding: Secondary | ICD-10-CM | POA: Diagnosis not present

## 2018-05-26 DIAGNOSIS — K5289 Other specified noninfective gastroenteritis and colitis: Secondary | ICD-10-CM | POA: Diagnosis not present

## 2018-05-26 DIAGNOSIS — H409 Unspecified glaucoma: Secondary | ICD-10-CM | POA: Diagnosis not present

## 2018-05-26 DIAGNOSIS — Z9181 History of falling: Secondary | ICD-10-CM | POA: Diagnosis not present

## 2018-05-26 DIAGNOSIS — E785 Hyperlipidemia, unspecified: Secondary | ICD-10-CM | POA: Diagnosis not present

## 2018-05-26 DIAGNOSIS — Z90710 Acquired absence of both cervix and uterus: Secondary | ICD-10-CM | POA: Diagnosis not present

## 2018-05-27 DIAGNOSIS — F411 Generalized anxiety disorder: Secondary | ICD-10-CM | POA: Diagnosis not present

## 2018-05-27 DIAGNOSIS — F0150 Vascular dementia without behavioral disturbance: Secondary | ICD-10-CM | POA: Diagnosis not present

## 2018-06-02 DIAGNOSIS — Z9049 Acquired absence of other specified parts of digestive tract: Secondary | ICD-10-CM | POA: Diagnosis not present

## 2018-06-02 DIAGNOSIS — K5289 Other specified noninfective gastroenteritis and colitis: Secondary | ICD-10-CM | POA: Diagnosis not present

## 2018-06-02 DIAGNOSIS — E785 Hyperlipidemia, unspecified: Secondary | ICD-10-CM | POA: Diagnosis not present

## 2018-06-02 DIAGNOSIS — K219 Gastro-esophageal reflux disease without esophagitis: Secondary | ICD-10-CM | POA: Diagnosis not present

## 2018-06-02 DIAGNOSIS — K5780 Diverticulitis of intestine, part unspecified, with perforation and abscess without bleeding: Secondary | ICD-10-CM | POA: Diagnosis not present

## 2018-06-02 DIAGNOSIS — Z90710 Acquired absence of both cervix and uterus: Secondary | ICD-10-CM | POA: Diagnosis not present

## 2018-06-02 DIAGNOSIS — H409 Unspecified glaucoma: Secondary | ICD-10-CM | POA: Diagnosis not present

## 2018-06-02 DIAGNOSIS — I10 Essential (primary) hypertension: Secondary | ICD-10-CM | POA: Diagnosis not present

## 2018-06-02 DIAGNOSIS — Z9181 History of falling: Secondary | ICD-10-CM | POA: Diagnosis not present

## 2018-06-10 DIAGNOSIS — Z03818 Encounter for observation for suspected exposure to other biological agents ruled out: Secondary | ICD-10-CM | POA: Diagnosis not present

## 2018-06-10 DIAGNOSIS — G894 Chronic pain syndrome: Secondary | ICD-10-CM | POA: Diagnosis not present

## 2018-06-12 DIAGNOSIS — I10 Essential (primary) hypertension: Secondary | ICD-10-CM | POA: Diagnosis not present

## 2018-06-12 DIAGNOSIS — H4010X4 Unspecified open-angle glaucoma, indeterminate stage: Secondary | ICD-10-CM | POA: Diagnosis not present

## 2018-06-12 DIAGNOSIS — D509 Iron deficiency anemia, unspecified: Secondary | ICD-10-CM | POA: Diagnosis not present

## 2018-06-12 DIAGNOSIS — E618 Deficiency of other specified nutrient elements: Secondary | ICD-10-CM | POA: Diagnosis not present

## 2018-06-14 DIAGNOSIS — F0150 Vascular dementia without behavioral disturbance: Secondary | ICD-10-CM | POA: Diagnosis not present

## 2018-06-29 DIAGNOSIS — F411 Generalized anxiety disorder: Secondary | ICD-10-CM | POA: Diagnosis not present

## 2018-06-29 DIAGNOSIS — F0150 Vascular dementia without behavioral disturbance: Secondary | ICD-10-CM | POA: Diagnosis not present

## 2018-07-09 DIAGNOSIS — M79674 Pain in right toe(s): Secondary | ICD-10-CM | POA: Diagnosis not present

## 2018-07-09 DIAGNOSIS — B351 Tinea unguium: Secondary | ICD-10-CM | POA: Diagnosis not present

## 2018-07-27 DIAGNOSIS — F0150 Vascular dementia without behavioral disturbance: Secondary | ICD-10-CM | POA: Diagnosis not present

## 2018-07-27 DIAGNOSIS — F411 Generalized anxiety disorder: Secondary | ICD-10-CM | POA: Diagnosis not present

## 2018-07-30 DIAGNOSIS — R109 Unspecified abdominal pain: Secondary | ICD-10-CM | POA: Diagnosis not present

## 2018-08-13 DIAGNOSIS — F0150 Vascular dementia without behavioral disturbance: Secondary | ICD-10-CM | POA: Diagnosis not present

## 2018-08-13 DIAGNOSIS — I10 Essential (primary) hypertension: Secondary | ICD-10-CM | POA: Diagnosis not present

## 2018-08-19 DIAGNOSIS — I10 Essential (primary) hypertension: Secondary | ICD-10-CM | POA: Diagnosis not present

## 2018-08-19 DIAGNOSIS — R1010 Upper abdominal pain, unspecified: Secondary | ICD-10-CM | POA: Diagnosis not present

## 2018-08-19 DIAGNOSIS — R600 Localized edema: Secondary | ICD-10-CM | POA: Diagnosis not present

## 2018-08-20 DIAGNOSIS — K5900 Constipation, unspecified: Secondary | ICD-10-CM | POA: Diagnosis not present

## 2018-08-20 DIAGNOSIS — R109 Unspecified abdominal pain: Secondary | ICD-10-CM | POA: Diagnosis not present

## 2018-09-02 DIAGNOSIS — F0150 Vascular dementia without behavioral disturbance: Secondary | ICD-10-CM | POA: Diagnosis not present

## 2018-09-02 DIAGNOSIS — F411 Generalized anxiety disorder: Secondary | ICD-10-CM | POA: Diagnosis not present

## 2018-09-11 DIAGNOSIS — R350 Frequency of micturition: Secondary | ICD-10-CM | POA: Diagnosis not present

## 2018-09-11 DIAGNOSIS — R8290 Unspecified abnormal findings in urine: Secondary | ICD-10-CM | POA: Diagnosis not present

## 2018-09-16 DIAGNOSIS — M79674 Pain in right toe(s): Secondary | ICD-10-CM | POA: Diagnosis not present

## 2018-09-16 DIAGNOSIS — B351 Tinea unguium: Secondary | ICD-10-CM | POA: Diagnosis not present

## 2018-09-17 DIAGNOSIS — R600 Localized edema: Secondary | ICD-10-CM | POA: Diagnosis not present

## 2018-09-17 DIAGNOSIS — I10 Essential (primary) hypertension: Secondary | ICD-10-CM | POA: Diagnosis not present

## 2018-09-17 DIAGNOSIS — D649 Anemia, unspecified: Secondary | ICD-10-CM | POA: Diagnosis not present

## 2018-09-17 DIAGNOSIS — K5900 Constipation, unspecified: Secondary | ICD-10-CM | POA: Diagnosis not present

## 2018-09-18 DIAGNOSIS — K5900 Constipation, unspecified: Secondary | ICD-10-CM | POA: Diagnosis not present

## 2018-09-18 DIAGNOSIS — R600 Localized edema: Secondary | ICD-10-CM | POA: Diagnosis not present

## 2018-09-18 DIAGNOSIS — G894 Chronic pain syndrome: Secondary | ICD-10-CM | POA: Diagnosis not present

## 2018-10-05 DIAGNOSIS — F0150 Vascular dementia without behavioral disturbance: Secondary | ICD-10-CM | POA: Diagnosis not present

## 2018-10-08 DIAGNOSIS — G894 Chronic pain syndrome: Secondary | ICD-10-CM | POA: Diagnosis not present

## 2018-10-08 DIAGNOSIS — D649 Anemia, unspecified: Secondary | ICD-10-CM | POA: Diagnosis not present

## 2018-10-15 DIAGNOSIS — K5900 Constipation, unspecified: Secondary | ICD-10-CM | POA: Diagnosis not present

## 2018-10-15 DIAGNOSIS — K219 Gastro-esophageal reflux disease without esophagitis: Secondary | ICD-10-CM | POA: Diagnosis not present

## 2018-10-15 DIAGNOSIS — R110 Nausea: Secondary | ICD-10-CM | POA: Diagnosis not present

## 2018-10-21 DIAGNOSIS — Z03818 Encounter for observation for suspected exposure to other biological agents ruled out: Secondary | ICD-10-CM | POA: Diagnosis not present

## 2018-10-28 DIAGNOSIS — Z03818 Encounter for observation for suspected exposure to other biological agents ruled out: Secondary | ICD-10-CM | POA: Diagnosis not present

## 2018-11-05 DIAGNOSIS — F0150 Vascular dementia without behavioral disturbance: Secondary | ICD-10-CM | POA: Diagnosis not present

## 2018-11-05 DIAGNOSIS — F411 Generalized anxiety disorder: Secondary | ICD-10-CM | POA: Diagnosis not present

## 2018-11-11 DIAGNOSIS — G894 Chronic pain syndrome: Secondary | ICD-10-CM | POA: Diagnosis not present

## 2018-11-11 DIAGNOSIS — I10 Essential (primary) hypertension: Secondary | ICD-10-CM | POA: Diagnosis not present

## 2018-11-12 DIAGNOSIS — I10 Essential (primary) hypertension: Secondary | ICD-10-CM | POA: Diagnosis not present

## 2018-11-12 DIAGNOSIS — N3281 Overactive bladder: Secondary | ICD-10-CM | POA: Diagnosis not present

## 2018-11-12 DIAGNOSIS — K219 Gastro-esophageal reflux disease without esophagitis: Secondary | ICD-10-CM | POA: Diagnosis not present

## 2018-11-13 DIAGNOSIS — Z03818 Encounter for observation for suspected exposure to other biological agents ruled out: Secondary | ICD-10-CM | POA: Diagnosis not present

## 2018-11-18 DIAGNOSIS — Z03818 Encounter for observation for suspected exposure to other biological agents ruled out: Secondary | ICD-10-CM | POA: Diagnosis not present

## 2018-11-25 DIAGNOSIS — Z03818 Encounter for observation for suspected exposure to other biological agents ruled out: Secondary | ICD-10-CM | POA: Diagnosis not present

## 2018-12-03 DIAGNOSIS — F0150 Vascular dementia without behavioral disturbance: Secondary | ICD-10-CM | POA: Diagnosis not present

## 2018-12-03 DIAGNOSIS — F411 Generalized anxiety disorder: Secondary | ICD-10-CM | POA: Diagnosis not present

## 2018-12-09 DIAGNOSIS — R609 Edema, unspecified: Secondary | ICD-10-CM | POA: Diagnosis not present

## 2018-12-09 DIAGNOSIS — K5900 Constipation, unspecified: Secondary | ICD-10-CM | POA: Diagnosis not present

## 2018-12-09 DIAGNOSIS — I10 Essential (primary) hypertension: Secondary | ICD-10-CM | POA: Diagnosis not present

## 2018-12-09 DIAGNOSIS — F0150 Vascular dementia without behavioral disturbance: Secondary | ICD-10-CM | POA: Diagnosis not present

## 2018-12-09 DIAGNOSIS — H4010X0 Unspecified open-angle glaucoma, stage unspecified: Secondary | ICD-10-CM | POA: Diagnosis not present

## 2018-12-17 DIAGNOSIS — F0150 Vascular dementia without behavioral disturbance: Secondary | ICD-10-CM | POA: Diagnosis not present

## 2018-12-21 DIAGNOSIS — N390 Urinary tract infection, site not specified: Secondary | ICD-10-CM | POA: Diagnosis not present

## 2018-12-24 DIAGNOSIS — Z03818 Encounter for observation for suspected exposure to other biological agents ruled out: Secondary | ICD-10-CM | POA: Diagnosis not present

## 2018-12-29 DIAGNOSIS — Z03818 Encounter for observation for suspected exposure to other biological agents ruled out: Secondary | ICD-10-CM | POA: Diagnosis not present

## 2018-12-31 DIAGNOSIS — Z20828 Contact with and (suspected) exposure to other viral communicable diseases: Secondary | ICD-10-CM | POA: Diagnosis not present

## 2018-12-31 DIAGNOSIS — F411 Generalized anxiety disorder: Secondary | ICD-10-CM | POA: Diagnosis not present

## 2018-12-31 DIAGNOSIS — F0150 Vascular dementia without behavioral disturbance: Secondary | ICD-10-CM | POA: Diagnosis not present

## 2019-01-01 ENCOUNTER — Emergency Department (HOSPITAL_COMMUNITY): Payer: Medicare Other

## 2019-01-01 ENCOUNTER — Emergency Department (HOSPITAL_COMMUNITY)
Admission: EM | Admit: 2019-01-01 | Discharge: 2019-01-01 | Disposition: A | Discharge status: Home / Self Care | Payer: Medicare Other | Attending: Emergency Medicine | Admitting: Emergency Medicine

## 2019-01-01 ENCOUNTER — Encounter (HOSPITAL_COMMUNITY): Payer: Self-pay

## 2019-01-01 ENCOUNTER — Other Ambulatory Visit: Payer: Self-pay

## 2019-01-01 DIAGNOSIS — R55 Syncope and collapse: Secondary | ICD-10-CM | POA: Diagnosis not present

## 2019-01-01 DIAGNOSIS — G459 Transient cerebral ischemic attack, unspecified: Secondary | ICD-10-CM | POA: Diagnosis not present

## 2019-01-01 DIAGNOSIS — F0390 Unspecified dementia without behavioral disturbance: Secondary | ICD-10-CM | POA: Insufficient documentation

## 2019-01-01 DIAGNOSIS — R410 Disorientation, unspecified: Secondary | ICD-10-CM | POA: Diagnosis not present

## 2019-01-01 DIAGNOSIS — I10 Essential (primary) hypertension: Secondary | ICD-10-CM | POA: Insufficient documentation

## 2019-01-01 DIAGNOSIS — R404 Transient alteration of awareness: Secondary | ICD-10-CM | POA: Diagnosis not present

## 2019-01-01 DIAGNOSIS — Z79899 Other long term (current) drug therapy: Secondary | ICD-10-CM | POA: Diagnosis not present

## 2019-01-01 DIAGNOSIS — R0902 Hypoxemia: Secondary | ICD-10-CM | POA: Diagnosis not present

## 2019-01-01 LAB — COMPREHENSIVE METABOLIC PANEL
ALT: 19 U/L (ref 0–44)
AST: 24 U/L (ref 15–41)
Albumin: 4.2 g/dL (ref 3.5–5.0)
Alkaline Phosphatase: 49 U/L (ref 38–126)
Anion gap: 12 (ref 5–15)
BUN: 23 mg/dL (ref 8–23)
CO2: 24 mmol/L (ref 22–32)
Calcium: 9.5 mg/dL (ref 8.9–10.3)
Chloride: 104 mmol/L (ref 98–111)
Creatinine, Ser: 1.09 mg/dL — ABNORMAL HIGH (ref 0.44–1.00)
GFR calc Af Amer: 49 mL/min — ABNORMAL LOW (ref >60)
GFR calc non Af Amer: 42 mL/min — ABNORMAL LOW (ref >60)
Glucose, Bld: 114 mg/dL — ABNORMAL HIGH (ref 70–99)
Potassium: 4 mmol/L (ref 3.5–5.1)
Sodium: 140 mmol/L (ref 135–145)
Total Bilirubin: 1 mg/dL (ref 0.3–1.2)
Total Protein: 7.3 g/dL (ref 6.5–8.1)

## 2019-01-01 LAB — CBC WITH DIFFERENTIAL/PLATELET
Abs Immature Granulocytes: 0.02 10*3/uL (ref 0.00–0.07)
Basophils Absolute: 0 10*3/uL (ref 0.0–0.1)
Basophils Relative: 1 %
Eosinophils Absolute: 0.1 10*3/uL (ref 0.0–0.5)
Eosinophils Relative: 2 %
HCT: 34.3 % — ABNORMAL LOW (ref 36.0–46.0)
Hemoglobin: 10.8 g/dL — ABNORMAL LOW (ref 12.0–15.0)
Immature Granulocytes: 0 %
Lymphocytes Relative: 17 %
Lymphs Abs: 1.4 10*3/uL (ref 0.7–4.0)
MCH: 31.9 pg (ref 26.0–34.0)
MCHC: 31.5 g/dL (ref 30.0–36.0)
MCV: 101.2 fL — ABNORMAL HIGH (ref 80.0–100.0)
Monocytes Absolute: 0.7 10*3/uL (ref 0.1–1.0)
Monocytes Relative: 9 %
Neutro Abs: 5.9 10*3/uL (ref 1.7–7.7)
Neutrophils Relative %: 71 %
Platelets: 197 10*3/uL (ref 150–400)
RBC: 3.39 MIL/uL — ABNORMAL LOW (ref 3.87–5.11)
RDW: 13.4 % (ref 11.5–15.5)
WBC: 8.3 10*3/uL (ref 4.0–10.5)
nRBC: 0 % (ref 0.0–0.2)

## 2019-01-01 NOTE — ED Provider Notes (Signed)
[facility] EMERGENCY DEPARTMENT Provider Note   CSN: [id number] Arrival date & time: 01/01/19  0901     History Chief Complaint  Patient presents with  . Loss of Consciousness    Deborah Farrell is a 85 y.o. female.  Patient with history of dementia, urine infection, high blood pressure presents after episode of unresponsive lasting approximately 5 minutes.  Patient was on the toilet at the time and did have dark-colored stool noted by EMS.  No reported recent bleeding on further discussion with nursing.  Patient returned to baseline afterwards.  No head injury, no fevers chills or shortness of breath.  No pain complaints.        Past Medical History:  Diagnosis Date  . Allergy history, radiographic dye   . Arthritis   . Dysuria   . Glaucoma   . HTN (hypertension)   . IBS (irritable bowel syndrome)    Non-ulcer dyspepsia; hemorrhoids  . Overactive bladder   . Tubulovillous adenoma 2007   with multifocal high grade dysplasia-WFBH 2007;TV ADENOMA w/o dysplasia 2008;  SIMPLE ADENOMA 2009; 48mm SIMPLE ADENOMA JAN 2011  . Urinary frequency   . Urinary tract infection     Patient Active Problem List   Diagnosis Date Noted  . Acute gastroenteritis 02/05/2018  . Dehydration 02/05/2018  . Elevated liver enzymes 02/05/2018  . Low grade fever 02/05/2018  . Dementia ([place]) 02/05/2018  . Diverticulosis 02/05/2018  . Generalized weakness 02/05/2018  . Hx of adenomatous colonic polyps 10/21/2017  . Vulvar discomfort 08/07/2017  . Nocturia 07/27/2014  . Recurrent cystitis 07/08/2013  . Atrophic vaginitis 07/08/2013  . Internal hemorrhoids with complication [id number]  . Stiffness of joints, not elsewhere classified, multiple sites 11/19/2012  . Poor balance 11/19/2012  . Hip pain, right 11/20/2011  . Back pain, lumbosacral 11/20/2011  . Dyspepsia 07/19/2011  . Diarrhea 07/19/2011  . Tubulovillous adenoma   . Hyperlipidemia 08/20/2007  . Unspecified glaucoma 08/20/2007    . Essential hypertension 08/20/2007  . Gastroesophageal reflux disease 08/20/2007  . Constipation 08/20/2007  . Irritable bowel syndrome 08/20/2007    Past Surgical History:  Procedure Laterality Date  . APPENDECTOMY    . CHOLECYSTECTOMY    . COLONOSCOPY  2008, 2011   diverticulitis diagnosed, 73mm sessile polyp removed inascending colon. area tattood for future observation and reccomended f/u colonoscopy in one year  . COLONOSCOPY N/A 04/24/2012   SLF:The colon IS redundant/ Single RECTAL polyp/Moderate diverticulosis/Small internal hemorrhoids  . OVARIAN CYST REMOVAL    . VAGINAL HYSTERECTOMY       OB History    Gravida  0   Para  0   Term  0   Preterm  0   AB  0   Living  0     SAB  0   TAB  0   Ectopic  0   Multiple  0   Live Births  0           Family History  Problem Relation Age of Onset  . Stroke Mother   . Heart attack Sister   . Heart disease Sister   . Cancer Maternal Grandfather     Social History   Tobacco Use  . Smoking status: Never Smoker  . Smokeless tobacco: Never Used  Substance Use Topics  . Alcohol use: No  . Drug use: No    Home Medications Prior to Admission medications   Medication Sig Start Date End Date Taking? Authorizing Provider  carvedilol (COREG) 12.5  MG tablet TAKE ONE-HALF TABLET BY MOUTH IN THE MORNING AND ONE TABLET BY MOUTH IN THE EVENING. 03/26/18  Yes Branch, Alphonse Guild, MD  Iron-FA-B Cmp-C-Biot-Probiotic (FUSION PLUS PO) Take 1 capsule by mouth daily.   Yes [provider]  latanoprost (XALATAN) 0.005 % ophthalmic solution Place 1 drop into both eyes at bedtime.     Yes [provider]  Multiple Vitamins-Minerals (CENTRUM SILVER PO) Take 1 tablet by mouth daily.   Yes [provider]  MYRBETRIQ 25 MG TB24 tablet Take 25 mg by mouth at bedtime.  03/09/16  Yes [provider]  olmesartan (BENICAR) 20 MG tablet Take 10 mg by mouth daily.    Yes [provider]   pantoprazole (PROTONIX) 40 MG tablet TAKE 1 TABLET BY MOUTH DAILY Patient taking differently: Take 40 mg by mouth daily.  12/10/17  Yes Carlis Stable, NP  Probiotic Product (ALIGN PO) Take 1 tablet by mouth daily.    Yes [provider]  prochlorperazine (COMPAZINE) 5 MG tablet TAKE 1 TABLET BY MOUTH EVERY 6 HOURS AS NEEDED FOR NAUSEA OR VOMITING Patient taking differently: Take by mouth every 6 (six) hours as needed for nausea or vomiting.  08/13/17  Yes Carlis Stable, NP  sertraline (ZOLOFT) 25 MG tablet Take 25 mg by mouth daily. 12/17/18  Yes [provider]  ALPRAZolam Duanne Moron) 0.5 MG tablet Take 1 tablet (0.5 mg total) by mouth at bedtime as needed for anxiety. Patient not taking: Reported on 01/01/2019 02/06/18   Irwin Brakeman L, MD  amLODipine (NORVASC) 5 MG tablet Take 5 mg by mouth daily.    [provider]  memantine (NAMENDA) 5 MG tablet Take 5 mg by mouth 2 (two) times daily.    [provider]  Menthol, Topical Analgesic, (BIOFREEZE EX) Apply 1 application topically 3 (three) times daily. Applied to toes bilateral feet    [provider]  Nutritional Supplements (ENSURE NUTRITION SHAKE) LIQD Take 237 mLs by mouth daily. Patient not taking: Reported on 01/01/2019 03/06/18   Erma Heritage, PA-C    Allergies    Tramadol, Aciphex [rabeprazole], Colchicine, Dicyclomine hcl, Hyoscyamine sulfate, Iodinated diagnostic agents, Nexium [esomeprazole magnesium], Omeprazole, and Sulfonamide derivatives  Review of Systems   Review of Systems  Unable to perform ROS: Dementia    Physical Exam Updated Vital Signs BP (!) 177/59 (BP Location: Right Arm)   Pulse 63   Temp 97.7 F (36.5 C) (Oral)   Resp 17   Ht 5\' 7"  (1.702 m)   Wt 63.5 kg   SpO2 98%   BMI 21.93 kg/m   Physical Exam Vitals and nursing note reviewed.  Constitutional:      Appearance: She is well-developed.  HENT:     Head: Normocephalic and atraumatic.  Eyes:      General:        Right eye: No discharge.        Left eye: No discharge.     Conjunctiva/sclera: Conjunctivae normal.  Neck:     Trachea: No tracheal deviation.  Cardiovascular:     Rate and Rhythm: Normal rate and regular rhythm.     Heart sounds: No murmur.  Pulmonary:     Effort: Pulmonary effort is normal. No respiratory distress.     Breath sounds: Normal breath sounds.  Abdominal:     General: There is no distension.     Palpations: Abdomen is soft.     Tenderness: There is no abdominal tenderness. There  is no guarding.  Musculoskeletal:     Cervical back: Normal range of motion and neck supple. No rigidity.  Skin:    General: Skin is warm.     Findings: No rash.  Neurological:     Mental Status: She is alert. Mental status is at baseline.     GCS: GCS eye subscore is 4. GCS verbal subscore is 4. GCS motor subscore is 6.     Comments: Patient has general weakness on exam, patient able to raise arms and legs equally against gravity.  Pupils equal bilateral.  Horizontal eye movements intact.  Difficult neuro exam due to pleasant dementia.     ED Results / Procedures / Treatments   Labs (all labs ordered are listed, but only abnormal results are displayed) Labs Reviewed  COMPREHENSIVE METABOLIC PANEL - Abnormal; Notable for the following components:      Result Value   Glucose, Bld 114 (*)    Creatinine, Ser 1.09 (*)    GFR calc non Af Amer 42 (*)    GFR calc Af Amer 49 (*)    All other components within normal limits  CBC WITH DIFFERENTIAL/PLATELET - Abnormal; Notable for the following components:   RBC 3.39 (*)    Hemoglobin 10.8 (*)    HCT 34.3 (*)    MCV 101.2 (*)    All other components within normal limits  URINALYSIS, ROUTINE W REFLEX MICROSCOPIC    EKG EKG Interpretation  Date/Time:  Friday January 01 2019 09:12:53 EST Ventricular Rate:  64 PR Interval:    QRS Duration: 110 QT Interval:  393 QTC Calculation: 406 R Axis:   21 Text  Interpretation: Sinus rhythm Borderline prolonged PR interval Nonspecific T abnormalities, lateral leads artifact Confirmed by Elnora Morrison [id number]) on 01/01/2019 9:36:15 AM   Radiology CT Head Wo Contrast  Result Date: 01/01/2019 CLINICAL DATA:  TIA EXAM: CT HEAD WITHOUT CONTRAST TECHNIQUE: Contiguous axial images were obtained from the base of the skull through the vertex without intravenous contrast. COMPARISON:  03/10/2018 FINDINGS: Brain: No evidence of acute infarction, hemorrhage, extra-axial collection, ventriculomegaly, or mass effect. Generalized cerebral atrophy. Periventricular white matter low attenuation likely secondary to microangiopathy. Vascular: Cerebrovascular atherosclerotic calcifications are noted. Skull: Negative for fracture or focal lesion. Sinuses/Orbits: Visualized portions of the orbits are unremarkable. Visualized portions of the paranasal sinuses are unremarkable. Visualized portions of the mastoid air cells are unremarkable. Other: None. IMPRESSION: No acute intracranial pathology. Electronically Signed   By: Kathreen Devoid   On: 01/01/2019 10:49    Procedures Procedures (including critical care time)  Medications Ordered in ED Medications - No data to display  ED Course  I have reviewed the triage vital signs and the nursing notes.  Pertinent labs & imaging results that were available during my care of the patient were reviewed by me and considered in my medical decision making (see chart for details).    MDM Rules/Calculators/A&P                     Patient with DNR paperwork presents from nursing home after syncopal event while on the toilet.  Concern clinically for vasovagal versus anemia related versus TIA/stroke versus other.  Plan for blood work, urinalysis, CT scan of the head.  EKG nonspecific T wave changes.  Discussed with nursing home patient did return to baseline and no other concerns.  Was able to obtain [facility] phone number cell phone  [phone] to discuss once results are back.  Patient at baseline in the ER, CT scan of the head no acute bleeding, blood work overall similar to previous with mild anemia 10.8 and creatinine 1.09.  Discussed the case with Almeta Monas family member for outpatient follow-up.  No indication for admission at this time.  Izora Gala describes she has had this multiple times usually when her blood pressure drops.   Final Clinical Impression(s) / ED Diagnoses Final diagnoses:  Syncope, unspecified syncope type    Rx / DC Orders ED Discharge Orders    None       Elnora Morrison, MD 01/01/19 1224

## 2019-01-01 NOTE — ED Triage Notes (Signed)
Pt brought in by EMS from [facility]. Pt was sitting on toilet having BM and went unresponsive for approx 6 mins. Confused when she started arousing when EMS arrived. Pt unsure what happened. CBG 161 BP 169/70. EMS reports black stool noted in toilet water. Pt is alert and oriented at this time

## 2019-01-01 NOTE — ED Notes (Signed)
discontinued IV

## 2019-01-01 NOTE — Discharge Instructions (Addendum)
Stay well-hydrated.  Return the emergency room for blood in the stools, strokelike symptoms, confusion, chest pain or new concerns.

## 2019-01-05 DIAGNOSIS — R4182 Altered mental status, unspecified: Secondary | ICD-10-CM | POA: Diagnosis not present

## 2019-01-05 DIAGNOSIS — Z03818 Encounter for observation for suspected exposure to other biological agents ruled out: Secondary | ICD-10-CM | POA: Diagnosis not present

## 2019-01-05 DIAGNOSIS — I10 Essential (primary) hypertension: Secondary | ICD-10-CM | POA: Diagnosis not present

## 2019-01-05 DIAGNOSIS — M6281 Muscle weakness (generalized): Secondary | ICD-10-CM | POA: Diagnosis not present

## 2019-01-12 DIAGNOSIS — Z20828 Contact with and (suspected) exposure to other viral communicable diseases: Secondary | ICD-10-CM | POA: Diagnosis not present

## 2019-01-14 DIAGNOSIS — F411 Generalized anxiety disorder: Secondary | ICD-10-CM | POA: Diagnosis not present

## 2019-01-14 DIAGNOSIS — I10 Essential (primary) hypertension: Secondary | ICD-10-CM | POA: Diagnosis not present

## 2019-01-14 DIAGNOSIS — F0150 Vascular dementia without behavioral disturbance: Secondary | ICD-10-CM | POA: Diagnosis not present

## 2019-01-14 DIAGNOSIS — H4010X0 Unspecified open-angle glaucoma, stage unspecified: Secondary | ICD-10-CM | POA: Diagnosis not present

## 2019-01-28 DIAGNOSIS — F0150 Vascular dementia without behavioral disturbance: Secondary | ICD-10-CM | POA: Diagnosis not present

## 2019-01-28 DIAGNOSIS — F411 Generalized anxiety disorder: Secondary | ICD-10-CM | POA: Diagnosis not present

## 2019-02-03 DIAGNOSIS — Z20828 Contact with and (suspected) exposure to other viral communicable diseases: Secondary | ICD-10-CM | POA: Diagnosis not present

## 2019-02-04 DIAGNOSIS — K219 Gastro-esophageal reflux disease without esophagitis: Secondary | ICD-10-CM | POA: Diagnosis not present

## 2019-02-04 DIAGNOSIS — G894 Chronic pain syndrome: Secondary | ICD-10-CM | POA: Diagnosis not present

## 2019-02-04 DIAGNOSIS — F331 Major depressive disorder, recurrent, moderate: Secondary | ICD-10-CM | POA: Diagnosis not present

## 2019-02-04 DIAGNOSIS — R609 Edema, unspecified: Secondary | ICD-10-CM | POA: Diagnosis not present

## 2019-02-04 DIAGNOSIS — H4010X0 Unspecified open-angle glaucoma, stage unspecified: Secondary | ICD-10-CM | POA: Diagnosis not present

## 2019-02-10 DIAGNOSIS — Z03818 Encounter for observation for suspected exposure to other biological agents ruled out: Secondary | ICD-10-CM | POA: Diagnosis not present

## 2019-02-15 DIAGNOSIS — F411 Generalized anxiety disorder: Secondary | ICD-10-CM | POA: Diagnosis not present

## 2019-02-15 DIAGNOSIS — F331 Major depressive disorder, recurrent, moderate: Secondary | ICD-10-CM | POA: Diagnosis not present

## 2019-02-17 DIAGNOSIS — Z03818 Encounter for observation for suspected exposure to other biological agents ruled out: Secondary | ICD-10-CM | POA: Diagnosis not present

## 2019-02-18 DIAGNOSIS — F0150 Vascular dementia without behavioral disturbance: Secondary | ICD-10-CM | POA: Diagnosis not present

## 2019-02-22 DIAGNOSIS — Z03818 Encounter for observation for suspected exposure to other biological agents ruled out: Secondary | ICD-10-CM | POA: Diagnosis not present

## 2019-02-24 DIAGNOSIS — Z03818 Encounter for observation for suspected exposure to other biological agents ruled out: Secondary | ICD-10-CM | POA: Diagnosis not present

## 2019-03-01 DIAGNOSIS — Z03818 Encounter for observation for suspected exposure to other biological agents ruled out: Secondary | ICD-10-CM | POA: Diagnosis not present

## 2019-03-01 DIAGNOSIS — F411 Generalized anxiety disorder: Secondary | ICD-10-CM | POA: Diagnosis not present

## 2019-03-01 DIAGNOSIS — F331 Major depressive disorder, recurrent, moderate: Secondary | ICD-10-CM | POA: Diagnosis not present

## 2019-03-03 DIAGNOSIS — Z03818 Encounter for observation for suspected exposure to other biological agents ruled out: Secondary | ICD-10-CM | POA: Diagnosis not present

## 2019-03-05 DIAGNOSIS — N183 Chronic kidney disease, stage 3 unspecified: Secondary | ICD-10-CM | POA: Diagnosis not present

## 2019-03-05 DIAGNOSIS — K5909 Other constipation: Secondary | ICD-10-CM | POA: Diagnosis not present

## 2019-03-05 DIAGNOSIS — D509 Iron deficiency anemia, unspecified: Secondary | ICD-10-CM | POA: Diagnosis not present

## 2019-03-08 DIAGNOSIS — Z03818 Encounter for observation for suspected exposure to other biological agents ruled out: Secondary | ICD-10-CM | POA: Diagnosis not present

## 2019-03-08 DIAGNOSIS — I10 Essential (primary) hypertension: Secondary | ICD-10-CM | POA: Diagnosis not present

## 2019-03-08 DIAGNOSIS — R609 Edema, unspecified: Secondary | ICD-10-CM | POA: Diagnosis not present

## 2019-03-08 DIAGNOSIS — K219 Gastro-esophageal reflux disease without esophagitis: Secondary | ICD-10-CM | POA: Diagnosis not present

## 2019-03-10 DIAGNOSIS — F0150 Vascular dementia without behavioral disturbance: Secondary | ICD-10-CM | POA: Diagnosis not present

## 2019-03-10 DIAGNOSIS — F411 Generalized anxiety disorder: Secondary | ICD-10-CM | POA: Diagnosis not present

## 2019-03-10 DIAGNOSIS — Z03818 Encounter for observation for suspected exposure to other biological agents ruled out: Secondary | ICD-10-CM | POA: Diagnosis not present

## 2019-03-11 DIAGNOSIS — I10 Essential (primary) hypertension: Secondary | ICD-10-CM | POA: Diagnosis not present

## 2019-03-11 DIAGNOSIS — D509 Iron deficiency anemia, unspecified: Secondary | ICD-10-CM | POA: Diagnosis not present

## 2019-03-15 DIAGNOSIS — F411 Generalized anxiety disorder: Secondary | ICD-10-CM | POA: Diagnosis not present

## 2019-03-29 DIAGNOSIS — F411 Generalized anxiety disorder: Secondary | ICD-10-CM | POA: Diagnosis not present

## 2019-03-31 DIAGNOSIS — K219 Gastro-esophageal reflux disease without esophagitis: Secondary | ICD-10-CM | POA: Diagnosis not present

## 2019-03-31 DIAGNOSIS — G894 Chronic pain syndrome: Secondary | ICD-10-CM | POA: Diagnosis not present

## 2019-03-31 DIAGNOSIS — F0150 Vascular dementia without behavioral disturbance: Secondary | ICD-10-CM | POA: Diagnosis not present

## 2019-04-07 DIAGNOSIS — F331 Major depressive disorder, recurrent, moderate: Secondary | ICD-10-CM | POA: Diagnosis not present

## 2019-04-07 DIAGNOSIS — F0150 Vascular dementia without behavioral disturbance: Secondary | ICD-10-CM | POA: Diagnosis not present

## 2019-04-07 DIAGNOSIS — G894 Chronic pain syndrome: Secondary | ICD-10-CM | POA: Diagnosis not present

## 2019-04-12 DIAGNOSIS — F411 Generalized anxiety disorder: Secondary | ICD-10-CM | POA: Diagnosis not present

## 2019-04-12 DIAGNOSIS — F331 Major depressive disorder, recurrent, moderate: Secondary | ICD-10-CM | POA: Diagnosis not present

## 2019-04-26 DIAGNOSIS — F331 Major depressive disorder, recurrent, moderate: Secondary | ICD-10-CM | POA: Diagnosis not present

## 2019-04-26 DIAGNOSIS — F411 Generalized anxiety disorder: Secondary | ICD-10-CM | POA: Diagnosis not present

## 2019-04-28 DIAGNOSIS — I10 Essential (primary) hypertension: Secondary | ICD-10-CM | POA: Diagnosis not present

## 2019-04-28 DIAGNOSIS — R609 Edema, unspecified: Secondary | ICD-10-CM | POA: Diagnosis not present

## 2019-04-28 DIAGNOSIS — K5909 Other constipation: Secondary | ICD-10-CM | POA: Diagnosis not present

## 2019-04-30 DIAGNOSIS — M109 Gout, unspecified: Secondary | ICD-10-CM | POA: Diagnosis not present

## 2019-05-05 DIAGNOSIS — F331 Major depressive disorder, recurrent, moderate: Secondary | ICD-10-CM | POA: Diagnosis not present

## 2019-05-05 DIAGNOSIS — F0150 Vascular dementia without behavioral disturbance: Secondary | ICD-10-CM | POA: Diagnosis not present

## 2019-05-05 DIAGNOSIS — F411 Generalized anxiety disorder: Secondary | ICD-10-CM | POA: Diagnosis not present

## 2019-05-10 ENCOUNTER — Ambulatory Visit: Payer: Medicare Other | Admitting: Physician Assistant

## 2019-05-10 DIAGNOSIS — F331 Major depressive disorder, recurrent, moderate: Secondary | ICD-10-CM | POA: Diagnosis not present

## 2019-05-10 DIAGNOSIS — F411 Generalized anxiety disorder: Secondary | ICD-10-CM | POA: Diagnosis not present

## 2019-05-13 ENCOUNTER — Encounter: Payer: Self-pay | Admitting: Cardiology

## 2019-05-13 ENCOUNTER — Telehealth (INDEPENDENT_AMBULATORY_CARE_PROVIDER_SITE_OTHER): Payer: Medicare Other | Admitting: Cardiology

## 2019-05-13 VITALS — BP 138/78 | HR 74 | Resp 18 | Ht 66.0 in | Wt 138.0 lb

## 2019-05-13 DIAGNOSIS — R600 Localized edema: Secondary | ICD-10-CM

## 2019-05-13 DIAGNOSIS — I10 Essential (primary) hypertension: Secondary | ICD-10-CM

## 2019-05-13 DIAGNOSIS — R002 Palpitations: Secondary | ICD-10-CM

## 2019-05-13 NOTE — Patient Instructions (Signed)
Medication Instructions: Your physician recommends that you continue on your current medications as directed. Please refer to the Current Medication list given to you today.   Labwork: None today  Procedures/Testing: None today  Follow-Up: 1 year office visit or telephone visit with Dr.McDowell  Any Additional Special Instructions Will Be Listed Below (If Applicable).     If you need a refill on your cardiac medications before your next appointment, please call your pharmacy.      Thank you for choosing [facility] !

## 2019-05-13 NOTE — Progress Notes (Signed)
Virtual Visit via Telephone Note   This visit type was conducted due to national recommendations for restrictions regarding the COVID-19 Pandemic (e.g. social distancing) in an effort to limit this patient's exposure and mitigate transmission in our community.  Due to her co-morbid illnesses, this patient is at least at moderate risk for complications without adequate follow up.  This format is felt to be most appropriate for this patient at this time.  The patient did not have access to video technology/had technical difficulties with video requiring transitioning to audio format only (telephone).  All issues noted in this document were discussed and addressed.  No physical exam could be performed with this format.  Please refer to the patient's chart for her  consent to telehealth for [facility].   The patient was identified using 2 identifiers.  Date:  05/13/2019   ID:  Deborah Farrell, DOB Sep 19, 1917, MRN [id number]  Patient Location: Home Provider Location: Office  PCP:  Celene Squibb, MD  Cardiologist:  Carlyle Dolly, MD  Electrophysiologist:  None   Evaluation Performed:  Follow-Up Visit  Chief Complaint:  Follow up visit  History of Present Illness:    Deborah Farrell is a 86 y.o. female seen today for follow up of the following medical problems. Due to advanced age and cognitive decline history is primarily provided by her nursing home nurse.    1. HTN - bp's at nursing home running 110s-130s/60s-80s - compliant with meds  2. LE edema - no recent issues - has not required diuretics. Prior issues with orthostatic symptoms and low bp's when previously on  3. Palpitations - denies any recent symptoms.    The patient does not have symptoms concerning for COVID-19 infection (fever, chills, cough, or new shortness of breath).    Past Medical History:  Diagnosis Date  . Allergy history, radiographic dye   . Arthritis   . Dysuria   . Glaucoma   . HTN  (hypertension)   . IBS (irritable bowel syndrome)    Non-ulcer dyspepsia; hemorrhoids  . Overactive bladder   . Tubulovillous adenoma 2007   with multifocal high grade dysplasia-WFBH 2007;TV ADENOMA w/o dysplasia 2008;  SIMPLE ADENOMA 2009; 62mm SIMPLE ADENOMA JAN 2011  . Urinary frequency   . Urinary tract infection    Past Surgical History:  Procedure Laterality Date  . APPENDECTOMY    . CHOLECYSTECTOMY    . COLONOSCOPY  2008, 2011   diverticulitis diagnosed, 49mm sessile polyp removed inascending colon. area tattood for future observation and reccomended f/u colonoscopy in one year  . COLONOSCOPY N/A 04/24/2012   SLF:The colon IS redundant/ Single RECTAL polyp/Moderate diverticulosis/Small internal hemorrhoids  . OVARIAN CYST REMOVAL    . VAGINAL HYSTERECTOMY       No outpatient medications have been marked as taking for the 05/13/19 encounter (Appointment) with Arnoldo Lenis, MD.     Allergies:   Tramadol, Aciphex [rabeprazole], Colchicine, Dicyclomine hcl, Hyoscyamine sulfate, Iodinated diagnostic agents, Nexium [esomeprazole magnesium], Omeprazole, and Sulfonamide derivatives   Social History   Tobacco Use  . Smoking status: Never Smoker  . Smokeless tobacco: Never Used  Substance Use Topics  . Alcohol use: No  . Drug use: No     Family Hx: The patient's family history includes Cancer in her maternal grandfather; Heart attack in her sister; Heart disease in her sister; Stroke in her mother.  ROS:   Please see the history of present illness.     All other systems  reviewed and are negative.   Prior CV studies:   The following studies were reviewed today:    Labs/Other Tests and Data Reviewed:    EKG:  No ECG reviewed.  Recent Labs: 01/01/2019: ALT 19; BUN 23; Creatinine, Ser 1.09; Hemoglobin 10.8; Platelets 197; Potassium 4.0; Sodium 140   Recent Lipid Panel Lab Results  Component Value Date/Time   CHOL 208 (H) 12/24/2010 08:35 AM   TRIG 122  12/24/2010 08:35 AM   HDL 48 12/24/2010 08:35 AM   CHOLHDL 4.3 12/24/2010 08:35 AM   LDLCALC 136 (H) 12/24/2010 08:35 AM    Wt Readings from Last 3 Encounters:  01/01/19 140 lb (63.5 kg)  03/10/18 136 lb 0.4 oz (61.7 kg)  03/06/18 139 lb (63 kg)     Objective:    Vital Signs:   Today's Vitals   05/13/19 1255  BP: 138/78  Pulse: 74  Resp: 18  Weight: 138 lb (62.6 kg)  Height: 5\' 6"  (1.676 m)   Body mass index is 22.27 kg/m. Normal affect. Normal speech pattern and tone. Comfortable, no apparent distress. No audible signs of sob or wheezing.   ASSESSMENT & PLAN:    1. HTN -- overall at goal, continue current meds  2. LE edema - no recent issues - has had prior issues with low bp's on diuretics, have been avoiding.  - continue to monitor   3. Palpitations - no recent symptoms - continue to monitor.    COVID-19 Education: The signs and symptoms of COVID-19 were discussed with the patient and how to seek care for testing (follow up with PCP or arrange E-visit).  The importance of social distancing was discussed today.  Time:   Today, I have spent 15 minutes with the patient with telehealth technology discussing the above problems.     Medication Adjustments/Labs and Tests Ordered: Current medicines are reviewed at length with the patient today.  Concerns regarding medicines are outlined above.   Tests Ordered: No orders of the defined types were placed in this encounter.   Medication Changes: No orders of the defined types were placed in this encounter.   Follow Up:  Either In Person or Virtual in 1 year(s)  Signed, Carlyle Dolly, MD  05/13/2019 11:00 AM    [place]

## 2019-05-17 DIAGNOSIS — G301 Alzheimer's disease with late onset: Secondary | ICD-10-CM | POA: Diagnosis not present

## 2019-05-17 DIAGNOSIS — F3341 Major depressive disorder, recurrent, in partial remission: Secondary | ICD-10-CM | POA: Diagnosis not present

## 2019-05-17 DIAGNOSIS — F331 Major depressive disorder, recurrent, moderate: Secondary | ICD-10-CM | POA: Diagnosis not present

## 2019-05-17 DIAGNOSIS — F0150 Vascular dementia without behavioral disturbance: Secondary | ICD-10-CM | POA: Diagnosis not present

## 2019-05-26 DIAGNOSIS — F0150 Vascular dementia without behavioral disturbance: Secondary | ICD-10-CM | POA: Diagnosis not present

## 2019-05-26 DIAGNOSIS — D509 Iron deficiency anemia, unspecified: Secondary | ICD-10-CM | POA: Diagnosis not present

## 2019-05-26 DIAGNOSIS — G894 Chronic pain syndrome: Secondary | ICD-10-CM | POA: Diagnosis not present

## 2019-05-31 DIAGNOSIS — F331 Major depressive disorder, recurrent, moderate: Secondary | ICD-10-CM | POA: Diagnosis not present

## 2019-06-02 DIAGNOSIS — F0150 Vascular dementia without behavioral disturbance: Secondary | ICD-10-CM | POA: Diagnosis not present

## 2019-06-02 DIAGNOSIS — G894 Chronic pain syndrome: Secondary | ICD-10-CM | POA: Diagnosis not present

## 2019-06-02 DIAGNOSIS — F331 Major depressive disorder, recurrent, moderate: Secondary | ICD-10-CM | POA: Diagnosis not present

## 2019-06-22 DIAGNOSIS — R609 Edema, unspecified: Secondary | ICD-10-CM | POA: Diagnosis not present

## 2019-06-22 DIAGNOSIS — D509 Iron deficiency anemia, unspecified: Secondary | ICD-10-CM | POA: Diagnosis not present

## 2019-06-22 DIAGNOSIS — K5909 Other constipation: Secondary | ICD-10-CM | POA: Diagnosis not present

## 2019-06-28 DIAGNOSIS — F331 Major depressive disorder, recurrent, moderate: Secondary | ICD-10-CM | POA: Diagnosis not present

## 2019-06-30 DIAGNOSIS — F331 Major depressive disorder, recurrent, moderate: Secondary | ICD-10-CM | POA: Diagnosis not present

## 2019-06-30 DIAGNOSIS — F0150 Vascular dementia without behavioral disturbance: Secondary | ICD-10-CM | POA: Diagnosis not present

## 2019-06-30 DIAGNOSIS — F411 Generalized anxiety disorder: Secondary | ICD-10-CM | POA: Diagnosis not present

## 2019-06-30 DIAGNOSIS — G894 Chronic pain syndrome: Secondary | ICD-10-CM | POA: Diagnosis not present

## 2019-07-06 ENCOUNTER — Inpatient Hospital Stay (HOSPITAL_COMMUNITY)
Admission: EM | Admit: 2019-07-06 | Discharge: 2019-07-21 | DRG: 069 | Disposition: A | Discharge status: Hospice | Payer: Medicare Other | Source: Skilled Nursing Facility | Attending: Family Medicine | Admitting: Family Medicine

## 2019-07-06 ENCOUNTER — Encounter (HOSPITAL_COMMUNITY): Payer: Self-pay

## 2019-07-06 ENCOUNTER — Other Ambulatory Visit: Payer: Self-pay

## 2019-07-06 ENCOUNTER — Emergency Department (HOSPITAL_COMMUNITY): Payer: Medicare Other

## 2019-07-06 DIAGNOSIS — I10 Essential (primary) hypertension: Secondary | ICD-10-CM | POA: Diagnosis not present

## 2019-07-06 DIAGNOSIS — G92 Toxic encephalopathy: Secondary | ICD-10-CM | POA: Diagnosis not present

## 2019-07-06 DIAGNOSIS — Z882 Allergy status to sulfonamides status: Secondary | ICD-10-CM | POA: Diagnosis not present

## 2019-07-06 DIAGNOSIS — Z515 Encounter for palliative care: Secondary | ICD-10-CM | POA: Diagnosis not present

## 2019-07-06 DIAGNOSIS — R600 Localized edema: Secondary | ICD-10-CM

## 2019-07-06 DIAGNOSIS — Z885 Allergy status to narcotic agent status: Secondary | ICD-10-CM | POA: Diagnosis not present

## 2019-07-06 DIAGNOSIS — A411 Sepsis due to other specified staphylococcus: Secondary | ICD-10-CM | POA: Diagnosis not present

## 2019-07-06 DIAGNOSIS — Z8249 Family history of ischemic heart disease and other diseases of the circulatory system: Secondary | ICD-10-CM

## 2019-07-06 DIAGNOSIS — Z823 Family history of stroke: Secondary | ICD-10-CM

## 2019-07-06 DIAGNOSIS — R7881 Bacteremia: Secondary | ICD-10-CM

## 2019-07-06 DIAGNOSIS — R4182 Altered mental status, unspecified: Secondary | ICD-10-CM

## 2019-07-06 DIAGNOSIS — R32 Unspecified urinary incontinence: Secondary | ICD-10-CM | POA: Diagnosis present

## 2019-07-06 DIAGNOSIS — G459 Transient cerebral ischemic attack, unspecified: Principal | ICD-10-CM | POA: Diagnosis present

## 2019-07-06 DIAGNOSIS — G8194 Hemiplegia, unspecified affecting left nondominant side: Secondary | ICD-10-CM | POA: Diagnosis present

## 2019-07-06 DIAGNOSIS — Z888 Allergy status to other drugs, medicaments and biological substances status: Secondary | ICD-10-CM | POA: Diagnosis not present

## 2019-07-06 DIAGNOSIS — K582 Mixed irritable bowel syndrome: Secondary | ICD-10-CM | POA: Diagnosis not present

## 2019-07-06 DIAGNOSIS — Z7189 Other specified counseling: Secondary | ICD-10-CM

## 2019-07-06 DIAGNOSIS — F419 Anxiety disorder, unspecified: Secondary | ICD-10-CM | POA: Diagnosis present

## 2019-07-06 DIAGNOSIS — R29810 Facial weakness: Secondary | ICD-10-CM | POA: Diagnosis not present

## 2019-07-06 DIAGNOSIS — Z90710 Acquired absence of both cervix and uterus: Secondary | ICD-10-CM | POA: Diagnosis not present

## 2019-07-06 DIAGNOSIS — R29700 NIHSS score 0: Secondary | ICD-10-CM | POA: Diagnosis present

## 2019-07-06 DIAGNOSIS — I2510 Atherosclerotic heart disease of native coronary artery without angina pectoris: Secondary | ICD-10-CM | POA: Diagnosis present

## 2019-07-06 DIAGNOSIS — F0390 Unspecified dementia without behavioral disturbance: Secondary | ICD-10-CM | POA: Diagnosis not present

## 2019-07-06 DIAGNOSIS — Z9049 Acquired absence of other specified parts of digestive tract: Secondary | ICD-10-CM

## 2019-07-06 DIAGNOSIS — Z66 Do not resuscitate: Secondary | ICD-10-CM | POA: Diagnosis present

## 2019-07-06 DIAGNOSIS — H409 Unspecified glaucoma: Secondary | ICD-10-CM | POA: Diagnosis present

## 2019-07-06 DIAGNOSIS — E876 Hypokalemia: Secondary | ICD-10-CM | POA: Diagnosis not present

## 2019-07-06 DIAGNOSIS — R509 Fever, unspecified: Secondary | ICD-10-CM

## 2019-07-06 DIAGNOSIS — F329 Major depressive disorder, single episode, unspecified: Secondary | ICD-10-CM | POA: Diagnosis present

## 2019-07-06 DIAGNOSIS — Z7401 Bed confinement status: Secondary | ICD-10-CM | POA: Diagnosis not present

## 2019-07-06 DIAGNOSIS — Z91041 Radiographic dye allergy status: Secondary | ICD-10-CM | POA: Diagnosis not present

## 2019-07-06 DIAGNOSIS — Z20822 Contact with and (suspected) exposure to covid-19: Secondary | ICD-10-CM | POA: Diagnosis present

## 2019-07-06 DIAGNOSIS — Z79899 Other long term (current) drug therapy: Secondary | ICD-10-CM

## 2019-07-06 DIAGNOSIS — N189 Chronic kidney disease, unspecified: Secondary | ICD-10-CM | POA: Diagnosis not present

## 2019-07-06 DIAGNOSIS — K589 Irritable bowel syndrome without diarrhea: Secondary | ICD-10-CM | POA: Diagnosis not present

## 2019-07-06 DIAGNOSIS — G9341 Metabolic encephalopathy: Secondary | ICD-10-CM

## 2019-07-06 DIAGNOSIS — I6523 Occlusion and stenosis of bilateral carotid arteries: Secondary | ICD-10-CM | POA: Diagnosis present

## 2019-07-06 DIAGNOSIS — R531 Weakness: Secondary | ICD-10-CM | POA: Diagnosis not present

## 2019-07-06 DIAGNOSIS — D72823 Leukemoid reaction: Secondary | ICD-10-CM | POA: Diagnosis not present

## 2019-07-06 DIAGNOSIS — R2990 Unspecified symptoms and signs involving the nervous system: Secondary | ICD-10-CM

## 2019-07-06 DIAGNOSIS — R9431 Abnormal electrocardiogram [ECG] [EKG]: Secondary | ICD-10-CM | POA: Diagnosis present

## 2019-07-06 DIAGNOSIS — E785 Hyperlipidemia, unspecified: Secondary | ICD-10-CM | POA: Diagnosis present

## 2019-07-06 DIAGNOSIS — D72829 Elevated white blood cell count, unspecified: Secondary | ICD-10-CM

## 2019-07-06 LAB — URINALYSIS, ROUTINE W REFLEX MICROSCOPIC
Bilirubin Urine: NEGATIVE
Glucose, UA: NEGATIVE mg/dL
Hgb urine dipstick: NEGATIVE
Ketones, ur: NEGATIVE mg/dL
Leukocytes,Ua: NEGATIVE
Nitrite: NEGATIVE
Protein, ur: 30 mg/dL — AB
Specific Gravity, Urine: 1.016 (ref 1.005–1.030)
pH: 5 (ref 5.0–8.0)

## 2019-07-06 LAB — COMPREHENSIVE METABOLIC PANEL
ALT: 16 U/L (ref 0–44)
AST: 20 U/L (ref 15–41)
Albumin: 4 g/dL (ref 3.5–5.0)
Alkaline Phosphatase: 62 U/L (ref 38–126)
Anion gap: 11 (ref 5–15)
BUN: 27 mg/dL — ABNORMAL HIGH (ref 8–23)
CO2: 25 mmol/L (ref 22–32)
Calcium: 9.3 mg/dL (ref 8.9–10.3)
Chloride: 103 mmol/L (ref 98–111)
Creatinine, Ser: 1.05 mg/dL — ABNORMAL HIGH (ref 0.44–1.00)
GFR calc Af Amer: 51 mL/min — ABNORMAL LOW (ref >60)
GFR calc non Af Amer: 44 mL/min — ABNORMAL LOW (ref >60)
Glucose, Bld: 107 mg/dL — ABNORMAL HIGH (ref 70–99)
Potassium: 3.9 mmol/L (ref 3.5–5.1)
Sodium: 139 mmol/L (ref 135–145)
Total Bilirubin: 0.5 mg/dL (ref 0.3–1.2)
Total Protein: 7.5 g/dL (ref 6.5–8.1)

## 2019-07-06 LAB — DIFFERENTIAL
Abs Immature Granulocytes: 0.04 10*3/uL (ref 0.00–0.07)
Basophils Absolute: 0.1 10*3/uL (ref 0.0–0.1)
Basophils Relative: 1 %
Eosinophils Absolute: 0.2 10*3/uL (ref 0.0–0.5)
Eosinophils Relative: 2 %
Immature Granulocytes: 0 %
Lymphocytes Relative: 17 %
Lymphs Abs: 1.7 10*3/uL (ref 0.7–4.0)
Monocytes Absolute: 0.9 10*3/uL (ref 0.1–1.0)
Monocytes Relative: 9 %
Neutro Abs: 7.4 10*3/uL (ref 1.7–7.7)
Neutrophils Relative %: 71 %

## 2019-07-06 LAB — RAPID URINE DRUG SCREEN, HOSP PERFORMED
Amphetamines: NOT DETECTED
Barbiturates: NOT DETECTED
Benzodiazepines: NOT DETECTED
Cocaine: NOT DETECTED
Opiates: NOT DETECTED
Tetrahydrocannabinol: NOT DETECTED

## 2019-07-06 LAB — CBC
HCT: 32.5 % — ABNORMAL LOW (ref 36.0–46.0)
Hemoglobin: 10.4 g/dL — ABNORMAL LOW (ref 12.0–15.0)
MCH: 31.5 pg (ref 26.0–34.0)
MCHC: 32 g/dL (ref 30.0–36.0)
MCV: 98.5 fL (ref 80.0–100.0)
Platelets: 196 10*3/uL (ref 150–400)
RBC: 3.3 MIL/uL — ABNORMAL LOW (ref 3.87–5.11)
RDW: 13.2 % (ref 11.5–15.5)
WBC: 10.4 10*3/uL (ref 4.0–10.5)
nRBC: 0 % (ref 0.0–0.2)

## 2019-07-06 LAB — PROTIME-INR
INR: 1.1 (ref 0.8–1.2)
Prothrombin Time: 13.4 seconds (ref 11.4–15.2)

## 2019-07-06 LAB — TSH: TSH: 1.642 u[IU]/mL (ref 0.350–4.500)

## 2019-07-06 LAB — SARS CORONAVIRUS 2 BY RT PCR (HOSPITAL ORDER, PERFORMED IN ~~LOC~~ HOSPITAL LAB): SARS Coronavirus 2: NEGATIVE

## 2019-07-06 LAB — APTT: aPTT: 31 seconds (ref 24–36)

## 2019-07-06 LAB — ETHANOL: Alcohol, Ethyl (B): <10 mg/dL (ref <10)

## 2019-07-06 LAB — MAGNESIUM: Magnesium: 2 mg/dL (ref 1.7–2.4)

## 2019-07-06 MED ORDER — SERTRALINE HCL 50 MG PO TABS
100.0000 mg | ORAL_TABLET | Freq: Every day | ORAL | Status: DC
  Administered 2019-07-07 – 2019-07-19 (×12): 100 mg via ORAL
  Filled 2019-07-06 (×14): qty 2

## 2019-07-06 MED ORDER — CLONAZEPAM 0.5 MG PO TABS
0.5000 mg | ORAL_TABLET | Freq: Two times a day (BID) | ORAL | Status: DC
  Administered 2019-07-07 – 2019-07-14 (×14): 0.5 mg via ORAL
  Filled 2019-07-06 (×14): qty 1

## 2019-07-06 MED ORDER — IRBESARTAN 75 MG PO TABS
37.5000 mg | ORAL_TABLET | Freq: Every day | ORAL | Status: DC
  Administered 2019-07-07 – 2019-07-19 (×13): 37.5 mg via ORAL
  Filled 2019-07-06 (×15): qty 1

## 2019-07-06 MED ORDER — ENOXAPARIN SODIUM 30 MG/0.3ML ~~LOC~~ SOLN
30.0000 mg | SUBCUTANEOUS | Status: DC
  Administered 2019-07-07 – 2019-07-14 (×8): 30 mg via SUBCUTANEOUS
  Filled 2019-07-06 (×8): qty 0.3

## 2019-07-06 MED ORDER — MIRABEGRON ER 25 MG PO TB24
25.0000 mg | ORAL_TABLET | Freq: Every day | ORAL | Status: DC
  Administered 2019-07-07 – 2019-07-19 (×11): 25 mg via ORAL
  Filled 2019-07-06 (×12): qty 1

## 2019-07-06 MED ORDER — POLYETHYLENE GLYCOL 3350 17 G PO PACK
17.0000 g | PACK | Freq: Every day | ORAL | Status: DC | PRN
  Administered 2019-07-08: 17 g via ORAL
  Filled 2019-07-06: qty 1

## 2019-07-06 MED ORDER — ASPIRIN EC 81 MG PO TBEC
81.0000 mg | DELAYED_RELEASE_TABLET | Freq: Every day | ORAL | Status: DC
  Administered 2019-07-07 – 2019-07-19 (×12): 81 mg via ORAL
  Filled 2019-07-06 (×14): qty 1

## 2019-07-06 MED ORDER — ASPIRIN 325 MG PO TABS
325.0000 mg | ORAL_TABLET | Freq: Once | ORAL | Status: AC
  Administered 2019-07-06: 325 mg via ORAL
  Filled 2019-07-06: qty 1

## 2019-07-06 NOTE — ED Provider Notes (Signed)
Emergency Department Provider Note   I have reviewed the triage vital signs and the nursing notes.   HISTORY  Chief Complaint Altered Mental Status   HPI Deborah Farrell is a 86 y.o. female with past history reviewed below presents to the emergency department from [facility] with concern for AMS and possible stroke. Patient was last seen normal sometime over the weekend at least 30 hours prior to ED presentation.  EMS report that the patient was seen by the weekday staff at [facility] yesterday and seemed to be weak and not ambulatory.  She was sitting in her chair and seemed to not be doing well.  The night staff from the weekend apparently reported that she had been like this for them at least on Sunday night.  The supervising MD was called to evaluate the patient today and EMS was called for transport.  Patient denies any pain to me but level 5 caveat does apply for dementia. She cannot give a meaningful timeline for the start of symptoms.   Level 5 caveat: Dementia    Past Medical History:  Diagnosis Date  . Allergy history, radiographic dye   . Arthritis   . Dysuria   . Glaucoma   . HTN (hypertension)   . IBS (irritable bowel syndrome)    Non-ulcer dyspepsia; hemorrhoids  . Overactive bladder   . Tubulovillous adenoma 2007   with multifocal high grade dysplasia-WFBH 2007;TV ADENOMA w/o dysplasia 2008;  SIMPLE ADENOMA 2009; 29mm SIMPLE ADENOMA JAN 2011  . Urinary frequency   . Urinary tract infection     Patient Active Problem List   Diagnosis Date Noted  . AMS (altered mental status) 07/06/2019  . Acute gastroenteritis 02/05/2018  . Dehydration 02/05/2018  . Elevated liver enzymes 02/05/2018  . Low grade fever 02/05/2018  . Dementia ([place]) 02/05/2018  . Diverticulosis 02/05/2018  . Generalized weakness 02/05/2018  . Hx of adenomatous colonic polyps 10/21/2017  . Vulvar discomfort 08/07/2017  . Nocturia 07/27/2014  . Recurrent cystitis 07/08/2013  . Atrophic  vaginitis 07/08/2013  . Internal hemorrhoids with complication 23/53/6144  . Stiffness of joints, not elsewhere classified, multiple sites 11/19/2012  . Poor balance 11/19/2012  . Hip pain, right 11/20/2011  . Back pain, lumbosacral 11/20/2011  . Dyspepsia 07/19/2011  . Diarrhea 07/19/2011  . Tubulovillous adenoma   . Hyperlipidemia 08/20/2007  . Unspecified glaucoma 08/20/2007  . Essential hypertension 08/20/2007  . Gastroesophageal reflux disease 08/20/2007  . Constipation 08/20/2007  . Irritable bowel syndrome 08/20/2007    Past Surgical History:  Procedure Laterality Date  . APPENDECTOMY    . CHOLECYSTECTOMY    . COLONOSCOPY  2008, 2011   diverticulitis diagnosed, 29mm sessile polyp removed inascending colon. area tattood for future observation and reccomended f/u colonoscopy in one year  . COLONOSCOPY N/A 04/24/2012   SLF:The colon IS redundant/ Single RECTAL polyp/Moderate diverticulosis/Small internal hemorrhoids  . OVARIAN CYST REMOVAL    . VAGINAL HYSTERECTOMY      Allergies Tramadol, Aciphex [rabeprazole], Codeine, Colchicine, Dicyclomine hcl, Hyoscyamine sulfate, Iodinated diagnostic agents, Nexium [esomeprazole magnesium], Omeprazole, and Sulfonamide derivatives  Family History  Problem Relation Age of Onset  . Stroke Mother   . Heart attack Sister   . Heart disease Sister   . Cancer Maternal Grandfather     Social History Social History   Tobacco Use  . Smoking status: Never Smoker  . Smokeless tobacco: Never Used  Vaping Use  . Vaping Use: Never used  Substance Use Topics  .  Alcohol use: No  . Drug use: No    Review of Systems  Level 5 caveat: Dementia   ____________________________________________   PHYSICAL EXAM:  VITAL SIGNS: Temp: 98.7 F Pulse: 71 RR: 16 BP: 157/70  Constitutional: Alert and speaking in short sentences. Confusion noted at times with dementia history noted.  Eyes: Conjunctivae are normal. PERRL. EOMI.  Head:  Atraumatic. Nose: No congestion/rhinnorhea. Mouth/Throat: Mucous membranes are moist. Neck: No stridor. Cardiovascular: Normal rate, regular rhythm. Good peripheral circulation. Grossly normal heart sounds.   Respiratory: Normal respiratory effort.  No retractions. Lungs CTAB. Gastrointestinal: Soft and nontender. No distention.  Musculoskeletal: No lower extremity tenderness nor edema. No gross deformities of extremities. Neurologic: No clear speech deficit but fluency is hard to assess with patient's brief responses and dementia. Mild left face droop and 4+/5 weakness in the LUE. Near normal strength in the bilateral LEs.  Skin:  Skin is warm, dry and intact. No rash noted.   ____________________________________________   LABS (all labs ordered are listed, but only abnormal results are displayed)  Labs Reviewed  CBC - Abnormal; Notable for the following components:      Result Value   RBC 3.30 (*)    Hemoglobin 10.4 (*)    HCT 32.5 (*)    All other components within normal limits  COMPREHENSIVE METABOLIC PANEL - Abnormal; Notable for the following components:   Glucose, Bld 107 (*)    BUN 27 (*)    Creatinine, Ser 1.05 (*)    GFR calc non Af Amer 44 (*)    GFR calc Af Amer 51 (*)    All other components within normal limits  URINALYSIS, ROUTINE W REFLEX MICROSCOPIC - Abnormal; Notable for the following components:   APPearance HAZY (*)    Protein, ur 30 (*)    Bacteria, UA RARE (*)    All other components within normal limits  SARS CORONAVIRUS 2 BY RT PCR (HOSPITAL ORDER, [place] [facility])  MRSA PCR SCREENING  ETHANOL  PROTIME-INR  APTT  DIFFERENTIAL  RAPID URINE DRUG SCREEN, HOSP PERFORMED  TSH  MAGNESIUM   ____________________________________________  EKG   EKG Interpretation  Date/Time:  Tuesday July 06 2019 16:55:33 EDT Ventricular Rate:  69 PR Interval:    QRS Duration: 73 QT Interval:  516 QTC Calculation: 553 R Axis:   39 Text  Interpretation: Sinus rhythm Borderline T abnormalities, lateral leads Prolonged QTc interval No STEMI Confirmed by Nanda Quinton [id number]) on 07/06/2019 5:02:15 PM       ____________________________________________  RADIOLOGY  CT Head Wo Contrast  Result Date: 07/06/2019 CLINICAL DATA:  Cognitive decline. Stroke suspected. Focal neuro deficit. EXAM: CT HEAD WITHOUT CONTRAST TECHNIQUE: Contiguous axial images were obtained from the base of the skull through the vertex without intravenous contrast. COMPARISON:  01/01/2019 FINDINGS: Brain: Oblique imaging plane. There is no evidence for acute hemorrhage, hydrocephalus, mass lesion, or abnormal extra-axial fluid collection. No definite CT evidence for acute infarction. Diffuse loss of parenchymal volume is consistent with atrophy. Patchy low attenuation in the deep hemispheric and periventricular white matter is nonspecific, but likely reflects chronic microvascular ischemic demyelination. Vascular: No hyperdense vessel or unexpected calcification. Skull: No evidence for fracture. No worrisome lytic or sclerotic lesion. Sinuses/Orbits: The visualized paranasal sinuses and mastoid air cells are clear. Visualized portions of the globes and intraorbital fat are unremarkable. Other: None. IMPRESSION: 1. No acute intracranial abnormality. 2. Atrophy with chronic small vessel white matter ischemic disease. Electronically Signed  By: Misty Stanley M.D.   On: 07/06/2019 17:36    ____________________________________________   PROCEDURES  Procedure(s) performed:   Procedures  None ____________________________________________   INITIAL IMPRESSION / ASSESSMENT AND PLAN / ED COURSE  Pertinent labs & imaging results that were available during my care of the patient were reviewed by me and considered in my medical decision making (see chart for details).   Patient presents emergency department by EMS with likely stroke.  Last seen normal is at least 30  hours prior to ED presentation.  She is well outside the window for any stroke intervention at this time.  Plan for CT head without contrast with patient does have a listed allergy to contrast dye although the exact nature of this allergy is not listed in epic. Follow up after CT head and labs.   CT imaging and labs reviewed. Discussed case with Neurology after their on-screen evaluation. Encephalopathy vs small infarct. Plan for ASA now and CVA work up. Discussed case and findings with Niece who is the POA.   Discussed patient's case with TR[facility] to request admission. Patient and family (if present) updated with plan. Care transferred to [facility] service.  I reviewed all nursing notes, vitals, pertinent old records, EKGs, labs, imaging (as available).  ____________________________________________  FINAL CLINICAL IMPRESSION(S) / ED DIAGNOSES  Final diagnoses:  Stroke-like symptoms  Prolonged Q-T interval on ECG     MEDICATIONS GIVEN DURING THIS VISIT:  Medications  aspirin EC tablet 81 mg (has no administration in time range)  clonazePAM (KLONOPIN) tablet 0.5 mg (has no administration in time range)  mirabegron ER (MYRBETRIQ) tablet 25 mg (has no administration in time range)  irbesartan (AVAPRO) tablet 37.5 mg (37.5 mg Oral Given 07/07/19 0333)  sertraline (ZOLOFT) tablet 100 mg (has no administration in time range)  enoxaparin (LOVENOX) injection 30 mg (has no administration in time range)  polyethylene glycol (MIRALAX / GLYCOLAX) packet 17 g (has no administration in time range)  aspirin tablet 325 mg (325 mg Oral Given 07/06/19 2036)  magnesium sulfate IVPB 2 g 50 mL (0 g Intravenous Stopped 07/07/19 0207)  potassium chloride (KLOR-CON) CR tablet 10 mEq (10 mEq Oral Given 07/07/19 0059)  hydrALAZINE (APRESOLINE) injection 10 mg (10 mg Intravenous Given 07/07/19 0333)    Note:  This document was prepared using Dragon voice recognition software and may include unintentional dictation  errors.  Nanda Quinton, MD, [facility] Emergency Medicine    Vandana Haman, [facility], MD 07/07/19 [phone]

## 2019-07-06 NOTE — H&P (Signed)
History and Physical    Deborah Farrell PYY:[id number] DOB: 17-Oct-1917 DOA: 07/06/2019  PCP: Celene Squibb, MD   Patient coming from: [facility]  I have personally briefly reviewed patient's old medical records in [place]  Chief Complaint: Altered menta; status  HPI: Deborah Farrell is a 86 y.o. female with medical history significant for dementia, hypertension, IBS.  Patient was brought to the ED via EMS with reports of altered mental status, patient unable to walk and talk as per her baseline.  Also reports of left facial droop and left-sided weakness.  Last known normal was more than 30 hours ago. At the time of my evaluation, patient is awake alert, able to tell me her name, answer a few simple questions, but she does not know why she is in the hospital.  Per nursing home staff, patient seemed weak and not ambulatory, patient had seem like this since Sunday night.  Patient was evaluated by the supervising doctor today and referred to the ED. Patient tells me she intermittently has pain with urination.  At baseline, patient is incontinent, and ambulatory with a walker.  ED Course: Systolic blood pressure 356P to 170s, otherwise stable vitals.  Unremarkable CMP.  Hemoglobin 10.4 at baseline.  Head CT without acute abnormality.  Telemetry neurology was consulted, patient was slow, but no focal deficits was appreciated.  Stroke work-up with MRI head, MRA head and neck recommended, echocardiogram.  Hospitalist to admit.  Review of Systems: As per HPI all other systems reviewed and negative.  Past Medical History:  Diagnosis Date  . Allergy history, radiographic dye   . Arthritis   . Dysuria   . Glaucoma   . HTN (hypertension)   . IBS (irritable bowel syndrome)    Non-ulcer dyspepsia; hemorrhoids  . Overactive bladder   . Tubulovillous adenoma 2007   with multifocal high grade dysplasia-WFBH 2007;TV ADENOMA w/o dysplasia 2008;  SIMPLE ADENOMA 2009; 96mm SIMPLE ADENOMA JAN  2011  . Urinary frequency   . Urinary tract infection     Past Surgical History:  Procedure Laterality Date  . APPENDECTOMY    . CHOLECYSTECTOMY    . COLONOSCOPY  2008, 2011   diverticulitis diagnosed, 69mm sessile polyp removed inascending colon. area tattood for future observation and reccomended f/u colonoscopy in one year  . COLONOSCOPY N/A 04/24/2012   SLF:The colon IS redundant/ Single RECTAL polyp/Moderate diverticulosis/Small internal hemorrhoids  . OVARIAN CYST REMOVAL    . VAGINAL HYSTERECTOMY       reports that she has never smoked. She has never used smokeless tobacco. She reports that she does not drink alcohol and does not use drugs.  Allergies  Allergen Reactions  . Tramadol Anaphylaxis  . Aciphex [Rabeprazole] Other (See Comments)    intolerance  . Codeine     Unknown-listed on current MAR  . Colchicine     Unknown-listed on current MAR  . Dicyclomine Hcl Other (See Comments)    Unknown  . Hyoscyamine Sulfate Other (See Comments)    Unknown  . Iodinated Diagnostic Agents   . Nexium [Esomeprazole Magnesium] Other (See Comments)    G.I. Upset  . Omeprazole Other (See Comments)    GI upset  . Sulfonamide Derivatives Other (See Comments)    Unknown    Family History  Problem Relation Age of Onset  . Stroke Mother   . Heart attack Sister   . Heart disease Sister   . Cancer Maternal Grandfather  Prior to Admission medications   Medication Sig Start Date End Date Taking? Authorizing Provider  acetaminophen (TYLENOL) 650 MG CR tablet Take 650 mg by mouth every 8 (eight) hours as needed for pain.   Yes [provider]  carboxymethylcellulose (REFRESH PLUS) 0.5 % SOLN Apply 1 drop to eye 4 (four) times daily as needed (dry eyes).   Yes [provider]  clonazePAM (KLONOPIN) 0.5 MG tablet Take 0.5 mg by mouth 2 (two) times daily.   Yes [provider]  ferrous sulfate 325 (65 FE) MG tablet Take 325 mg by mouth 2 (two) times a  week. Mondays and Thursdays   Yes [provider]  furosemide (LASIX) 20 MG tablet Take 10 mg by mouth daily as needed for fluid.   Yes [provider]  Iron-FA-B Cmp-C-Biot-Probiotic (FUSION PLUS PO) Take 1 capsule by mouth daily.   Yes [provider]  latanoprost (XALATAN) 0.005 % ophthalmic solution Place 1 drop into both eyes at bedtime.     Yes [provider]  magnesium hydroxide (MILK OF MAGNESIA) 400 MG/5ML suspension Take 30 mLs by mouth daily as needed for mild constipation.   Yes [provider]  Menthol, Topical Analgesic, (BIOFREEZE EX) Apply 1 application topically 3 (three) times daily. Applied to toes bilateral feet   Yes [provider]  Multiple Vitamins-Minerals (CENTRUM SILVER PO) Take 1 tablet by mouth daily.   Yes [provider]  MYRBETRIQ 25 MG TB24 tablet Take 25 mg by mouth at bedtime.  03/09/16  Yes [provider]  olmesartan (BENICAR) 5 MG tablet Take 5 mg by mouth daily.    Yes [provider]  ondansetron (ZOFRAN) 4 MG tablet Take 4 mg by mouth every 6 (six) hours as needed for nausea or vomiting.   Yes [provider]  pantoprazole (PROTONIX) 40 MG tablet TAKE 1 TABLET BY MOUTH DAILY Patient taking differently: Take 40 mg by mouth daily.  12/10/17  Yes Carlis Stable, NP  polyethylene glycol powder (GLYCOLAX/MIRALAX) 17 GM/SCOOP powder Take 17 g by mouth every 6 (six) hours as needed for mild constipation or moderate constipation.   Yes [provider]  Probiotic Product (ALIGN PO) Take 1 tablet by mouth daily.    Yes [provider]  prochlorperazine (COMPAZINE) 5 MG tablet TAKE 1 TABLET BY MOUTH EVERY 6 HOURS AS NEEDED FOR NAUSEA OR VOMITING Patient taking differently: Take 5 mg by mouth daily.  08/13/17  Yes Carlis Stable, NP  sertraline (ZOLOFT) 100 MG tablet Take 100 mg by mouth daily.  12/17/18  Yes [provider]  Witch Hazel (PREPARATION H) 50 %  PADS Apply 1 application topically 3 (three) times daily as needed (for irritation).   Yes [provider]    Physical Exam: Vitals:   07/06/19 1720 07/06/19 1730 07/06/19 1740 07/06/19 1750  BP:  (!) 160/70    Pulse: 66 66 66 69  Resp: 16 16 16 16   SpO2: 97% 96% 96% 97%    Constitutional: NAD, calm, comfortable Vitals:   07/06/19 1720 07/06/19 1730 07/06/19 1740 07/06/19 1750  BP:  (!) 160/70    Pulse: 66 66 66 69  Resp: 16 16 16 16   SpO2: 97% 96% 96% 97%   Eyes: PERRL, lids and conjunctivae normal ENMT: Mucous membranes are moist. Neck: normal, supple, no masses, no thyromegaly Respiratory: clear to auscultation bilaterally, no wheezing, no crackles. Normal respiratory effort. No accessory muscle use.  Cardiovascular: Regular rate and  rhythm, no murmurs / rubs / gallops. No extremity edema. 2+ pedal pulses.  Abdomen: no tenderness, no masses palpated. No hepatosplenomegaly. Bowel sounds positive.  Musculoskeletal: no clubbing / cyanosis. No joint deformity upper and lower extremities. Good ROM, no contractures. Normal muscle tone.  Skin: no rashes, lesions, ulcers. No induration Neurologic: Exam limited by patient's baseline dementia.  Patient is slow to respond and slow to understand what I am saying.  But she has 5/5 strength in both upper extremities with good grip strength, no facial asymmetry noted, speech is reduced in volume, but is fluent without aphasia, 4+/5 strength in bilateral lower extremities.  Sensation intact globally. Psychiatric: Normal judgment and insight. Alert and oriented to person and place, but not to time or situation.. Normal mood.   Labs on Admission: I have personally reviewed following labs and imaging studies  CBC: Recent Labs  Lab 07/06/19 1713  WBC 10.4  NEUTROABS 7.4  HGB 10.4*  HCT 32.5*  MCV 98.5  PLT 831   Basic Metabolic Panel: Recent Labs  Lab 07/06/19 1713  NA 139  K 3.9  CL 103  CO2 25  GLUCOSE 107*  BUN 27*    CREATININE 1.05*  CALCIUM 9.3   Liver Function Tests: Recent Labs  Lab 07/06/19 1713  AST 20  ALT 16  ALKPHOS 62  BILITOT 0.5  PROT 7.5  ALBUMIN 4.0   Coagulation Profile: Recent Labs  Lab 07/06/19 1713  INR 1.1   Thyroid Function Tests: Recent Labs    07/06/19 1713  TSH 1.642    Radiological Exams on Admission: CT Head Wo Contrast  Result Date: 07/06/2019 CLINICAL DATA:  Cognitive decline. Stroke suspected. Focal neuro deficit. EXAM: CT HEAD WITHOUT CONTRAST TECHNIQUE: Contiguous axial images were obtained from the base of the skull through the vertex without intravenous contrast. COMPARISON:  01/01/2019 FINDINGS: Brain: Oblique imaging plane. There is no evidence for acute hemorrhage, hydrocephalus, mass lesion, or abnormal extra-axial fluid collection. No definite CT evidence for acute infarction. Diffuse loss of parenchymal volume is consistent with atrophy. Patchy low attenuation in the deep hemispheric and periventricular white matter is nonspecific, but likely reflects chronic microvascular ischemic demyelination. Vascular: No hyperdense vessel or unexpected calcification. Skull: No evidence for fracture. No worrisome lytic or sclerotic lesion. Sinuses/Orbits: The visualized paranasal sinuses and mastoid air cells are clear. Visualized portions of the globes and intraorbital fat are unremarkable. Other: None. IMPRESSION: 1. No acute intracranial abnormality. 2. Atrophy with chronic small vessel white matter ischemic disease. Electronically Signed   By: Misty Stanley M.D.   On: 07/06/2019 17:36    EKG: Independently reviewed.  Sinus rhythm.  No significant ST or T wave changes compared to prior EKG.  QTc prolonged at 553.  Assessment/Plan Principal Problem:   AMS (altered mental status) Active Problems:   Essential hypertension   Irritable bowel syndrome   Dementia (HCC)   Altered mental status-with reported left-sided weakness, left facial droop.  Baseline  dementia.  Head CT without acute abnormality.  UA with rare bacteria.  Afebrile without leukocytosis.  Normal TSH.  Outside TPA window.  Mental status appears to be back to baseline, no focal deficits appreciated on my evaluation. -Neurology consulted, stroke work-up recommended MRI brain, MRA head and neck, Echocardiogram. -For now MRI brain only has been ordered, further work-up deferred for now pending MRI findings, also considering no significant benefit of aggressive stroke work-up considering patient's advanced age and baseline dementia. -PT evaluation. -Aspirin 81 mg daily.  Prolonged  QTC-potassium 3.9.  Home medications include sertraline -Check magnesium  HTN-systolic 850Y to 774J. -Resume home olmesartan  Dementia- awake and oriented, able to answer simple questions appropriately.  Nursing home resident . -Continue home clonazepam.   DVT prophylaxis: Lovenox Code Status: Universal DNR form at bedside. Family Communication: None at bedside Disposition Plan: 1 to 2 days Consults called: None Admission status: Observation, telemetry.   Bethena Roys MD [facility]  07/06/2019, 10:49 PM

## 2019-07-06 NOTE — ED Triage Notes (Signed)
Patient having AMS including inability to walk, talk as normal. Pt with left facial droop, left sided weakness.   Last known well >30 hours.  Pt resident of [facility].  Hx dementia at baseline. Pt alert but disoriented upon arrival.

## 2019-07-06 NOTE — ED Notes (Signed)
Updated Deborah Farrell, niece and POA of pt, on pt condition.

## 2019-07-06 NOTE — Consult Note (Signed)
   [facility] Tele[facility]urology Consult Services  Stat Consult  Date of Service:   07/06/2019 18:56:52  Impression:     .  I63.9 - Cerebrovascular accident (CVA), unspecified mechanism ([place])     .  G45.9 - Transient cerebral ischemic attack, unspecified  Comments/Sign-Out: Patient presented with initial left sided weakness. On examination seems to be slow to move but equal. HCT showed no acute process. No alteplase since patient is outside of treatment time window.  CT HEAD: Showed No Acute Hemorrhage or Acute Core Infarct  Metrics: [facility] Notification Time: 07/06/2019 18:54:02 Stamp Time: 07/06/2019 18:56:52 Callback Response Time: 07/06/2019 19:08:04  Our recommendations are outlined below.  Recommendations:     .  Initiate Aspirin 81 MG Daily   Imaging Studies:     .  MRI Head Without Contrast     .  MRA Head and Neck Without Contrast When Available - Stroke Protocol     .  Echocardiogram - Transthoracic Echocardiogram  Therapies:     .  Physical Therapy, Occupational Therapy, Speech Therapy Assessment When Applicable  Disposition: Neurology Follow Up Recommended  Sign Out:     .  Discussed with Emergency Department Provider  ----------------------------------------------------------------------------------------------------  Chief Complaint: AMS  History of Present Illness: Patient is a 86 year old Female.  Past Medical History of dementia, Hypertension presented with left sided weakness and confusion. IT was reported that last seen normal was around sunday June 20th 2021. Patient currently live in a nursing home, it was first noted by weekend staff on sunday but it wasn't until provider evaluated patient today the decision was made to be seen in hospital. At baseline ambulatory with a walker but needs care for ADL and is incontinent.    Past Medical History:     . Hypertension     . Coronary Artery Disease         Examination: BP(160/70),  Pulse(69), Blood Glucose(107) Speech is low and slow but understandable  1A: Level of Consciousness - Alert; keenly responsive + 0 1B: Ask Month and Age - Both Questions Right + 0 1C: Blink Eyes & Squeeze Hands - Performs Both Tasks + 0 2: Test Horizontal Extraocular Movements - Normal + 0 3: Test Visual Fields - No Visual Loss + 0 4: Test Facial Palsy (Use Grimace if Obtunded) - Normal symmetry + 0 5A: Test Left Arm Motor Drift - No Drift for 10 Seconds + 0 5B: Test Right Arm Motor Drift - No Drift for 10 Seconds + 0 6A: Test Left Leg Motor Drift - No Drift for 5 Seconds + 0 6B: Test Right Leg Motor Drift - No Drift for 5 Seconds + 0 7: Test Limb Ataxia (FNF/Heel-Shin) - No Ataxia + 0 8: Test Sensation - Normal; No sensory loss + 0 9: Test Language/Aphasia - Normal; No aphasia + 0 10: Test Dysarthria - Normal + 0 11: Test Extinction/Inattention - No abnormality + 0  NIHSS Score: 0    Patient is being evaluated for possible acute neurologic impairment and high probability of imminent or life-threatening deterioration. I spent total of 16 minutes providing care to this patient, including time for face to face visit via telemedicine, review of medical records, imaging studies and discussion of findings with providers, the patient and/or family.   Dr Hinda Lenis Tobin Cadiente   [facility] [phone]  Case [id number]

## 2019-07-07 ENCOUNTER — Observation Stay (HOSPITAL_COMMUNITY): Payer: Medicare Other

## 2019-07-07 ENCOUNTER — Observation Stay (HOSPITAL_BASED_OUTPATIENT_CLINIC_OR_DEPARTMENT_OTHER): Payer: Medicare Other

## 2019-07-07 DIAGNOSIS — I6523 Occlusion and stenosis of bilateral carotid arteries: Secondary | ICD-10-CM | POA: Diagnosis not present

## 2019-07-07 DIAGNOSIS — G459 Transient cerebral ischemic attack, unspecified: Secondary | ICD-10-CM

## 2019-07-07 DIAGNOSIS — I10 Essential (primary) hypertension: Secondary | ICD-10-CM | POA: Diagnosis not present

## 2019-07-07 DIAGNOSIS — R9431 Abnormal electrocardiogram [ECG] [EKG]: Secondary | ICD-10-CM

## 2019-07-07 DIAGNOSIS — F0390 Unspecified dementia without behavioral disturbance: Secondary | ICD-10-CM | POA: Diagnosis not present

## 2019-07-07 LAB — ECHOCARDIOGRAM COMPLETE
Height: 66 in
Weight: 2278.67 oz

## 2019-07-07 LAB — MRSA PCR SCREENING: MRSA by PCR: NEGATIVE

## 2019-07-07 MED ORDER — POTASSIUM CHLORIDE CRYS ER 10 MEQ PO TBCR
10.0000 meq | EXTENDED_RELEASE_TABLET | Freq: Once | ORAL | Status: AC
  Administered 2019-07-07: 10 meq via ORAL
  Filled 2019-07-07: qty 1

## 2019-07-07 MED ORDER — MAGNESIUM SULFATE 2 GM/50ML IV SOLN
2.0000 g | Freq: Once | INTRAVENOUS | Status: AC
  Administered 2019-07-07: 2 g via INTRAVENOUS
  Filled 2019-07-07: qty 50

## 2019-07-07 MED ORDER — HYDRALAZINE HCL 20 MG/ML IJ SOLN
10.0000 mg | Freq: Once | INTRAMUSCULAR | Status: AC
  Administered 2019-07-07: 10 mg via INTRAVENOUS
  Filled 2019-07-07: qty 1

## 2019-07-07 NOTE — Progress Notes (Signed)
[facility] night shift.  The patient's blood pressure is 192/70 mmHg with a heart rate of 64 bpm.  No new complaints.  Hydralazine 10 mg IVP x1 ordered.  Avapro 37.5 mg p.o. will be given early.  Tennis Must, MD

## 2019-07-07 NOTE — Progress Notes (Signed)
*  PRELIMINARY RESULTS* Echocardiogram 2D Echocardiogram has been performed.  [name] 07/07/2019, 3:28 PM

## 2019-07-07 NOTE — Care Management Obs Status (Signed)
[facility] NOTIFICATION   Patient Details  Name: Deborah Farrell MRN: [id number] Date of Birth: 14-Dec-1917   Medicare Observation Status Notification Given:  Yes    Tommy Medal 07/07/2019, 4:15 PM

## 2019-07-07 NOTE — Progress Notes (Signed)
[facility] night shift.  The staff reports that the patient is having QT prolongation of more than 500 ms.  He is on a number of medications at home that could prolong the QT interval including furosemide, mirabegron, pantoprazole ondansetron and sertraline.  Furosemide, pantoprazole and ondansetron had already been held.  Mid upper groin and sertraline as scheduled for the morning.  I will supplement magnesium and potassium.  Continue cardiac telemetry.  Tennis Must, MD

## 2019-07-07 NOTE — Plan of Care (Signed)
°  Problem: Acute Rehab PT Goals(only PT should resolve) Goal: Pt Will Go Supine/Side To Sit Outcome: Progressing Flowsheets (Taken 07/07/2019 1600) Pt will go Supine/Side to Sit: with minimal assist Goal: Patient Will Transfer Sit To/From Stand Outcome: Progressing Flowsheets (Taken 07/07/2019 1600) Patient will transfer sit to/from stand: with moderate assist Goal: Pt Will Transfer Bed To Chair/Chair To Bed Outcome: Progressing Flowsheets (Taken 07/07/2019 1600) Pt will Transfer Bed to Chair/Chair to Bed: with mod assist Goal: Pt Will Ambulate Outcome: Progressing Flowsheets (Taken 07/07/2019 1600) Pt will Ambulate:  15 feet  with moderate assist  with rolling walker   4:00 PM, 07/07/19 Lonell Grandchild, MPT Physical Therapist with [facility] [phone] office [phone] mobile phone

## 2019-07-07 NOTE — Evaluation (Signed)
Physical Therapy Evaluation Patient Details Name: Deborah Farrell MRN: [id number] DOB: 10/07/1917 Today's Date: 07/07/2019   History of Present Illness  Deborah Farrell is a 86 y.o. female with medical history significant for dementia, hypertension, IBS.  Patient was brought to the ED via EMS with reports of altered mental status, patient unable to walk and talk as per her baseline.  Also reports of left facial droop and left-sided weakness.  Last known normal was more than 30 hours ago.At the time of my evaluation, patient is awake alert, able to tell me her name, answer a few simple questions, but she does not know why she is in the hospital.  Per nursing home staff, patient seemed weak and not ambulatory, patient had seem like this since Sunday night.  Patient was evaluated by the supervising doctor today and referred to the ED.Patient tells me she intermittently has pain with urination.  At baseline, patient is incontinent, and ambulatory with a walker.    Clinical Impression  Patient demonstrates slow labored movement for sitting up at bedside requiring Mod assistance to sit up at bedside, very unsteady on feet with narrow base of support and requires tactile assistance to advance BLE once fatigued, at severe risk for falls and put back to bed with [place] tech and members present in room after therapy.  Patient will benefit from continued physical therapy in hospital and recommended venue below to increase strength, balance, endurance for safe ADLs and gait.     Follow Up Recommendations SNF;Supervision for mobility/OOB;Supervision - Intermittent    Equipment Recommendations  None recommended by PT    Recommendations for Other Services       Precautions / Restrictions Precautions Precautions: Fall Restrictions Weight Bearing Restrictions: No      Mobility  Bed Mobility Overal bed mobility: Needs Assistance Bed Mobility: Supine to Sit;Sit to Supine     Supine to sit: Mod assist Sit  to supine: Mod assist   General bed mobility comments: slow labored movement  Transfers Overall transfer level: Needs assistance Equipment used: Rolling walker (2 wheeled) Transfers: Sit to/from [facility] Sit to Stand: Mod assist;Max assist Stand pivot transfers: Max assist       General transfer comment: very weak  Ambulation/Gait Ambulation/Gait assistance: Max assist Gait Distance (Feet): 6 Feet Assistive device: Rolling walker (2 wheeled) Gait Pattern/deviations: Decreased step length - right;Decreased step length - left;Decreased stride length;Narrow base of support Gait velocity: slow   General Gait Details: limited to 6-7 slow labored steps at bedside with diffiuclty advancing BLE due to weakness  Stairs            Wheelchair Mobility    Modified Rankin (Stroke Patients Only)       Balance Overall balance assessment: Needs assistance Sitting-balance support: Feet supported;No upper extremity supported Sitting balance-Leahy Scale: Fair Sitting balance - Comments: seated at EOB   Standing balance support: During functional activity;Bilateral upper extremity supported Standing balance-Leahy Scale: Poor Standing balance comment: using RW                             Pertinent Vitals/Pain Pain Assessment: No/denies pain    Home Living Family/patient expects to be discharged to:: Assisted living                      Prior Function Level of Independence: Needs assistance   Gait / Transfers Assistance Needed: Assisted short distanced household  gait with RW  ADL's / Homemaking Assistance Needed: assisted by ALF staff        Hand Dominance        Extremity/Trunk Assessment   Upper Extremity Assessment Upper Extremity Assessment: Generalized weakness    Lower Extremity Assessment Lower Extremity Assessment: Generalized weakness    Cervical / Trunk Assessment Cervical / Trunk Assessment: Kyphotic   Communication   Communication: No difficulties  Cognition Arousal/Alertness: Awake/alert Behavior During Therapy: WFL for tasks assessed/performed Overall Cognitive Status: History of cognitive impairments - at baseline                                        General Comments      Exercises     Assessment/Plan    PT Assessment Patient needs continued PT services  PT Problem List Decreased strength;Decreased activity tolerance;Decreased balance;Decreased mobility       PT Treatment Interventions Balance training;Gait training;Functional mobility training;Therapeutic activities;Therapeutic exercise;Patient/family education    PT Goals (Current goals can be found in the Care Plan section)  Acute Rehab PT Goals Patient Stated Goal: return home with assistance PT Goal Formulation: With patient/family Time For Goal Achievement: 07/21/19 Potential to Achieve Goals: Good    Frequency Min 3X/week   Barriers to discharge        Co-evaluation               AM-PAC PT "6 Clicks" Mobility  Outcome Measure Help needed turning from your back to your side while in a flat bed without using bedrails?: A Lot Help needed moving from lying on your back to sitting on the side of a flat bed without using bedrails?: A Lot Help needed moving to and from a bed to a chair (including a wheelchair)?: A Lot Help needed standing up from a chair using your arms (e.g., wheelchair or bedside chair)?: A Lot Help needed to walk in hospital room?: A Lot Help needed climbing 3-5 steps with a railing? : Total 6 Click Score: 11    End of Session   Activity Tolerance: Patient tolerated treatment well;Patient limited by fatigue Patient left: in bed;with call bell/phone within reach;with family/visitor present;with nursing/sitter in room Nurse Communication: Mobility status PT Visit Diagnosis: Unsteadiness on feet (R26.81);Other abnormalities of gait and mobility (R26.89);Muscle  weakness (generalized) (M62.81)    Time: 1761-6073 PT Time Calculation (min) (ACUTE ONLY): 25 min   Charges:   PT Evaluation $PT Eval Moderate Complexity: 1 Mod PT Treatments $Therapeutic Activity: 23-37 mins        3:59 PM, 07/07/19 Lonell Grandchild, MPT Physical Therapist with [facility] [phone] office [phone] mobile phone

## 2019-07-07 NOTE — Progress Notes (Signed)
PROGRESS NOTE    Deborah Farrell  VCB:[id number] DOB: 1917/07/13 DOA: 07/06/2019 PCP: Celene Squibb, MD    Brief Narrative:  HPI: Deborah Farrell is a 86 y.o. female with medical history significant for dementia, hypertension, IBS.  Patient was brought to the ED via EMS with reports of altered mental status, patient unable to walk and talk as per her baseline.  Also reports of left facial droop and left-sided weakness.  Last known normal was more than 30 hours ago. At the time of my evaluation, patient is awake alert, able to tell me her name, answer a few simple questions, but she does not know why she is in the hospital.  Per nursing home staff, patient seemed weak and not ambulatory, patient had seem like this since Sunday night.  Patient was evaluated by the supervising doctor today and referred to the ED. Patient tells me she intermittently has pain with urination.  At baseline, patient is incontinent, and ambulatory with a walker.   Assessment & Plan:   Principal Problem:   AMS (altered mental status) Active Problems:   Essential hypertension   Irritable bowel syndrome   Dementia (HCC)   TIA (transient ischemic attack)   Carotid stenosis, bilateral   1. Transient left facial droop/left-sided weakness.  Possibly secondary to TIA.  Symptoms resolved by the time she was admitted to the hospital.  She is not had any recurrence.  MRI brain negative.  She is noted to have bilateral carotid artery stenosis.  At her advanced age and comorbidities, would not anticipate that she would be a candidate for aggressive vascular intervention.  Will discuss with family regarding further work-up.  CT angiogram would be next step in work-up, although she is noted to have allergy to IV dye. 2. Dementia.  She is awake and oriented.  Able to answer simple questions.  She resides at a nursing home. 3. Hypertension.  Continue on olmesartan.   DVT prophylaxis: enoxaparin (LOVENOX) injection 30 mg Start:  07/07/19 1000  Code Status: DNR Family Communication: None present Disposition Plan: Status is: Observation  The patient remains OBS appropriate and will d/c before 2 midnights.  Patient found to have bilateral carotid artery stenosis.  Will need to discuss further work-up with family if desired.  Dispo: The patient is from: SNF              Anticipated d/c is to: SNF              Anticipated d/c date is: 1 day              Patient currently is not medically stable to d/c.   Consultants:     Procedures:     Antimicrobials:       Subjective: Patient is aware that she is in the hospital, she does not know why she was brought to the hospital.  She cannot tell me where she normally resides.  Objective: Vitals:   07/07/19 0904 07/07/19 1100 07/07/19 1500 07/07/19 1916  BP: (!) 193/82 (!) 137/59 138/78 (!) 163/70  Pulse: 67 67  70  Resp: 18 16  16   Temp: 98.3 F (36.8 C) 98.9 F (37.2 C) 98 F (36.7 C) 98.1 F (36.7 C)  TempSrc: Oral Oral Oral Oral  SpO2: 96% 98% 98% 99%  Weight:      Height:        Intake/Output Summary (Last 24 hours) at 07/07/2019 2021 Last data filed at 07/07/2019 1500 Gross per  24 hour  Intake 529.41 ml  Output 400 ml  Net 129.41 ml   Filed Weights   07/06/19 2315  Weight: 64.6 kg    Examination:  General exam: Appears calm and comfortable  Respiratory system: Clear to auscultation. Respiratory effort normal. Cardiovascular system: S1 & S2 heard, RRR. No JVD, murmurs, rubs, gallops or clicks. No pedal edema. Gastrointestinal system: Abdomen is nondistended, soft and nontender. No organomegaly or masses felt. Normal bowel sounds heard. Central nervous system: Alert and oriented. No focal neurological deficits. Extremities: Symmetric 5 x 5 power. Skin: No rashes, lesions or ulcers Psychiatry: Judgement and insight appear normal. Mood & affect appropriate.     Data Reviewed: I have personally reviewed following labs and imaging  studies  CBC: Recent Labs  Lab 07/06/19 1713  WBC 10.4  NEUTROABS 7.4  HGB 10.4*  HCT 32.5*  MCV 98.5  PLT 353   Basic Metabolic Panel: Recent Labs  Lab 07/06/19 1713  NA 139  K 3.9  CL 103  CO2 25  GLUCOSE 107*  BUN 27*  CREATININE 1.05*  CALCIUM 9.3  MG 2.0   GFR: Estimated Creatinine Clearance: 27.3 mL/min (A) (by C-G formula based on SCr of 1.05 mg/dL (H)). Liver Function Tests: Recent Labs  Lab 07/06/19 1713  AST 20  ALT 16  ALKPHOS 62  BILITOT 0.5  PROT 7.5  ALBUMIN 4.0   No results for input(s): LIPASE, AMYLASE in the last 168 hours. No results for input(s): AMMONIA in the last 168 hours. Coagulation Profile: Recent Labs  Lab 07/06/19 1713  INR 1.1   Cardiac Enzymes: No results for input(s): CKTOTAL, CKMB, CKMBINDEX, TROPONINI in the last 168 hours. BNP (last 3 results) No results for input(s): PROBNP in the last 8760 hours. HbA1C: No results for input(s): HGBA1C in the last 72 hours. CBG: No results for input(s): GLUCAP in the last 168 hours. Lipid Profile: No results for input(s): CHOL, HDL, LDLCALC, TRIG, CHOLHDL, LDLDIRECT in the last 72 hours. Thyroid Function Tests: Recent Labs    07/06/19 1713  TSH 1.642   Anemia Panel: No results for input(s): VITAMINB12, FOLATE, FERRITIN, TIBC, IRON, RETICCTPCT in the last 72 hours. Sepsis Labs: No results for input(s): PROCALCITON, LATICACIDVEN in the last 168 hours.  Recent Results (from the past 240 hour(s))  SARS Coronavirus 2 by RT PCR (hospital order, performed in [facility] hospital lab) Nasopharyngeal Nasopharyngeal Swab     Status: None   Collection Time: 07/06/19  5:04 PM   Specimen: Nasopharyngeal Swab  Result Value Ref Range Status   SARS Coronavirus 2 NEGATIVE NEGATIVE Final    Comment: (NOTE) SARS-CoV-2 target nucleic acids are NOT DETECTED.  The SARS-CoV-2 RNA is generally detectable in upper and lower respiratory specimens during the acute phase of infection. The  lowest concentration of SARS-CoV-2 viral copies this assay can detect is 250 copies / mL. A negative result does not preclude SARS-CoV-2 infection and should not be used as the sole basis for treatment or other patient management decisions.  A negative result may occur with improper specimen collection / handling, submission of specimen other than nasopharyngeal swab, presence of viral mutation(s) within the areas targeted by this assay, and inadequate number of viral copies (<250 copies / mL). A negative result must be combined with clinical observations, patient history, and epidemiological information.  Fact Sheet for Patients:   StrictlyIdeas.no  Fact Sheet for Healthcare Providers: BankingDealers.co.za  This test is not yet approved or  cleared by the [place]  States FDA and has been authorized for detection and/or diagnosis of SARS-CoV-2 by FDA under an Emergency Use Authorization (EUA).  This EUA will remain in effect (meaning this test can be used) for the duration of the COVID-19 declaration under Section 564(b)(1) of the Act, 21 U.S.C. section 360bbb-3(b)(1), unless the authorization is terminated or revoked sooner.  Performed at [facility], [address], [place], [place] [address]   MRSA PCR Screening     Status: None   Collection Time: 07/07/19  5:25 AM   Specimen: Nasal Mucosa; Nasopharyngeal  Result Value Ref Range Status   MRSA by PCR NEGATIVE NEGATIVE Final    Comment:        The GeneXpert MRSA Assay (FDA approved for NASAL specimens only), is one component of a comprehensive MRSA colonization surveillance program. It is not intended to diagnose MRSA infection nor to guide or monitor treatment for MRSA infections. Performed at [facility], [address], [place], [place] [address]          Radiology Studies: CT Head Wo Contrast  Result Date: 07/06/2019 CLINICAL DATA:  Cognitive decline. Stroke  suspected. Focal neuro deficit. EXAM: CT HEAD WITHOUT CONTRAST TECHNIQUE: Contiguous axial images were obtained from the base of the skull through the vertex without intravenous contrast. COMPARISON:  01/01/2019 FINDINGS: Brain: Oblique imaging plane. There is no evidence for acute hemorrhage, hydrocephalus, mass lesion, or abnormal extra-axial fluid collection. No definite CT evidence for acute infarction. Diffuse loss of parenchymal volume is consistent with atrophy. Patchy low attenuation in the deep hemispheric and periventricular white matter is nonspecific, but likely reflects chronic microvascular ischemic demyelination. Vascular: No hyperdense vessel or unexpected calcification. Skull: No evidence for fracture. No worrisome lytic or sclerotic lesion. Sinuses/Orbits: The visualized paranasal sinuses and mastoid air cells are clear. Visualized portions of the globes and intraorbital fat are unremarkable. Other: None. IMPRESSION: 1. No acute intracranial abnormality. 2. Atrophy with chronic small vessel white matter ischemic disease. Electronically Signed   By: Misty Stanley M.D.   On: 07/06/2019 17:36   MR ANGIO HEAD WO CONTRAST  Result Date: 07/07/2019 CLINICAL DATA:  Altered mental status EXAM: MRI HEAD WITHOUT CONTRAST MRA HEAD WITHOUT CONTRAST TECHNIQUE: Multiplanar, multiecho pulse sequences of the brain and surrounding structures were obtained without intravenous contrast. Angiographic images of the head were obtained using MRA technique without contrast. COMPARISON:  None. FINDINGS: MRI HEAD Motion artifact is present. Brain: There is no acute infarction or intracranial hemorrhage. There is no intracranial mass, mass effect, or edema. There is no hydrocephalus or extra-axial fluid collection. Prominence of the ventricles and sulci reflects generalized parenchymal volume loss. Patchy T2 hyperintensity in the supratentorial white matter is nonspecific but may reflect mild to moderate chronic  microvascular ischemic changes. Vascular: Major vessel flow voids at the skull base are preserved. Skull and upper cervical spine: Normal marrow signal is preserved. Sinuses/Orbits: Minor mucosal thickening.  Orbits are unremarkable. Other: Sella is unremarkable.  Mastoid air cells are clear. MRA HEAD Motion artifact is present. Intracranial internal carotid arteries are patent. Proximal middle and anterior cerebral artery segments appear grossly patent but are poorly visualized. Intracranial vertebral arteries, basilar artery, and proximal posterior cerebral arteries are patent. IMPRESSION: Motion degraded studies. No evidence of acute infarction, hemorrhage, or mass. No proximal intracranial vessel occlusion. Electronically Signed   By: Macy Mis M.D.   On: 07/07/2019 13:00   MR BRAIN WO CONTRAST  Result Date: 07/07/2019 CLINICAL DATA:  Altered mental status EXAM: MRI HEAD  WITHOUT CONTRAST MRA HEAD WITHOUT CONTRAST TECHNIQUE: Multiplanar, multiecho pulse sequences of the brain and surrounding structures were obtained without intravenous contrast. Angiographic images of the head were obtained using MRA technique without contrast. COMPARISON:  None. FINDINGS: MRI HEAD Motion artifact is present. Brain: There is no acute infarction or intracranial hemorrhage. There is no intracranial mass, mass effect, or edema. There is no hydrocephalus or extra-axial fluid collection. Prominence of the ventricles and sulci reflects generalized parenchymal volume loss. Patchy T2 hyperintensity in the supratentorial white matter is nonspecific but may reflect mild to moderate chronic microvascular ischemic changes. Vascular: Major vessel flow voids at the skull base are preserved. Skull and upper cervical spine: Normal marrow signal is preserved. Sinuses/Orbits: Minor mucosal thickening.  Orbits are unremarkable. Other: Sella is unremarkable.  Mastoid air cells are clear. MRA HEAD Motion artifact is present. Intracranial  internal carotid arteries are patent. Proximal middle and anterior cerebral artery segments appear grossly patent but are poorly visualized. Intracranial vertebral arteries, basilar artery, and proximal posterior cerebral arteries are patent. IMPRESSION: Motion degraded studies. No evidence of acute infarction, hemorrhage, or mass. No proximal intracranial vessel occlusion. Electronically Signed   By: Macy Mis M.D.   On: 07/07/2019 13:00   US Carotid Bilateral  Result Date: 07/07/2019 CLINICAL DATA:  TIA.  History of hypertension. EXAM: BILATERAL CAROTID DUPLEX ULTRASOUND TECHNIQUE: Pearline Cables scale imaging, color Doppler and duplex ultrasound were performed of bilateral carotid and vertebral arteries in the neck. COMPARISON:  None. FINDINGS: Criteria: Quantification of carotid stenosis is based on velocity parameters that correlate the residual internal carotid diameter with NASCET-based stenosis levels, using the diameter of the distal internal carotid lumen as the denominator for stenosis measurement. The following velocity measurements were obtained: RIGHT ICA: 216/19 cm/sec CCA: 64/4 cm/sec SYSTOLIC ICA/CCA RATIO:  2.2 ECA: 40 cm/sec LEFT ICA: 80/18 cm/sec CCA: 03/47 cm/sec SYSTOLIC ICA/CCA RATIO:  1.2 ECA: 112 cm/sec RIGHT CAROTID ARTERY: There is a large amount of eccentric echogenic plaque within the right carotid bulb (image 21 and 24), extending to involve the origin and proximal aspects of the right internal carotid artery (image 34), which results in elevated peak systolic velocities within the proximal aspect the right internal carotid artery. Greatest acquired peak systolic velocity with the proximal right ICA measures 216 centimeters/second (image 37). RIGHT VERTEBRAL ARTERY:  Antegrade Flow LEFT CAROTID ARTERY: There is a large amount of eccentric echogenic plaque within the left carotid bulb (images 68 and 71), extending to involve the origin and proximal aspects of the left internal carotid  artery (image 82), morphologically resulting at least 50% luminal narrowing, though definitely resulting in elevated peak systolic velocities within the interrogated course of the left internal carotid artery to suggest a hemodynamically significant stenosis. LEFT VERTEBRAL ARTERY:  Antegrade flow IMPRESSION: 1. Large amount of right-sided atherosclerotic plaque results in elevated peak systolic velocities within the right internal carotid artery which approach the greater than 70% luminal narrowing range. Further evaluation with CTA could performed as clinically indicated. 2. Large amount of left-sided atherosclerotic plaque, morphologically results in at least 50% luminal narrowing, though does not result in elevated peak systolic velocities within the interrogated course of the left internal carotid artery to suggest a hemodynamically significant stenosis. Again, further evaluation with CTA could performed as indicated. 3. Antegrade flow demonstrated within the bilateral vertebral arteries. Electronically Signed   By: Sandi Mariscal M.D.   On: 07/07/2019 16:33   ECHOCARDIOGRAM COMPLETE  Result Date: 07/07/2019    ECHOCARDIOGRAM REPORT  Patient Name:   Deborah Farrell Date of Exam: 07/07/2019 Medical Rec #:  [id number]       Height:       66.0 in Accession #:    [id number]      Weight:       142.4 lb Date of Birth:  12-19-1917       BSA:          1.731 m Patient Age:    40 years        BP:           137/59 mmHg Patient Gender: F               HR:           67 bpm. Exam Location:  [facility] Procedure: 2D Echo Indications:    TIA 435.9 / G45.9  History:        Patient has no prior history of Echocardiogram examinations.                 Risk Factors:Hypertension, Dyslipidemia and Non-Smoker.  Sonographer:    [name] RDCS (AE) Referring Phys: [employer]  1. Left ventricular ejection fraction, by estimation, is 65 to 70%. The left ventricle has normal function. The left ventricle has no  regional wall motion abnormalities. There is moderate left ventricular hypertrophy. Left ventricular diastolic parameters are indeterminate.  2. Right ventricular systolic function is normal. The right ventricular size is normal. There is normal pulmonary artery systolic pressure.  3. The mitral valve is normal in structure. No evidence of mitral valve regurgitation. No evidence of mitral stenosis.  4. The aortic valve is tricuspid. Aortic valve regurgitation is not visualized. No aortic stenosis is present.  5. The inferior vena cava is normal in size with greater than 50% respiratory variability, suggesting right atrial pressure of 3 mmHg. FINDINGS  Left Ventricle: Left ventricular ejection fraction, by estimation, is 65 to 70%. The left ventricle has normal function. The left ventricle has no regional wall motion abnormalities. The left ventricular internal cavity size was normal in size. There is  moderate left ventricular hypertrophy. Left ventricular diastolic parameters are indeterminate. Right Ventricle: The right ventricular size is normal. Right vetricular wall thickness was not assessed. Right ventricular systolic function is normal. There is normal pulmonary artery systolic pressure. The tricuspid regurgitant velocity is 1.93 m/s, and with an assumed right atrial pressure of 10 mmHg, the estimated right ventricular systolic pressure is 09.3 mmHg. Left Atrium: Left atrial size was normal in size. Right Atrium: Right atrial size was not well visualized. Pericardium: There is no evidence of pericardial effusion. Mitral Valve: The mitral valve is normal in structure. No evidence of mitral valve regurgitation. No evidence of mitral valve stenosis. Tricuspid Valve: The tricuspid valve is normal in structure. Tricuspid valve regurgitation is trivial. No evidence of tricuspid stenosis. Aortic Valve: The aortic valve is tricuspid. Aortic valve regurgitation is not visualized. No aortic stenosis is present. Aortic  valve mean gradient measures 2.6 mmHg. Aortic valve peak gradient measures 4.3 mmHg. Aortic valve area, by VTI measures 2.94 cm. Pulmonic Valve: The pulmonic valve was not well visualized. Pulmonic valve regurgitation is not visualized. No evidence of pulmonic stenosis. Aorta: The aortic root is normal in size and structure. Pulmonary Artery: Indeterminant PASP, inadequate TR jet. Venous: The inferior vena cava is normal in size with greater than 50% respiratory variability, suggesting right atrial pressure of 3 mmHg. IAS/Shunts: No atrial level shunt detected  by color flow Doppler.  LEFT VENTRICLE PLAX 2D LVIDd:         3.54 cm  Diastology LVIDs:         1.88 cm  LV e' lateral:   7.07 cm/s LV PW:         1.26 cm  LV E/e' lateral: 9.7 LV IVS:        1.37 cm  LV e' medial:    5.44 cm/s LVOT diam:     2.00 cm  LV E/e' medial:  12.7 LV SV:         69 LV SV Index:   40 LVOT Area:     3.14 cm  RIGHT VENTRICLE RV S prime:     14.00 cm/s TAPSE (M-mode): 1.8 cm LEFT ATRIUM           Index       RIGHT ATRIUM           Index LA diam:      3.60 cm 2.08 cm/m  RA Area:     12.20 cm LA Vol (A2C): 30.5 ml 17.62 ml/m RA Volume:   29.70 ml  17.16 ml/m LA Vol (A4C): 38.8 ml 22.41 ml/m  AORTIC VALVE AV Area (Vmax):    2.98 cm AV Area (Vmean):   2.64 cm AV Area (VTI):     2.94 cm AV Vmax:           104.25 cm/s AV Vmean:          76.936 cm/s AV VTI:            0.236 m AV Peak Grad:      4.3 mmHg AV Mean Grad:      2.6 mmHg LVOT Vmax:         98.95 cm/s LVOT Vmean:        64.652 cm/s LVOT VTI:          0.221 m LVOT/AV VTI ratio: 0.94  AORTA Ao Root diam: 3.00 cm MITRAL VALVE               TRICUSPID VALVE MV Area (PHT): 3.03 cm    TR Peak grad:   14.9 mmHg MV Decel Time: 250 msec    TR Vmax:        193.00 cm/s MV E velocity: 68.90 cm/s MV A velocity: 57.70 cm/s  SHUNTS MV E/A ratio:  1.19        Systemic VTI:  0.22 m                            Systemic Diam: 2.00 cm Carlyle Dolly MD Electronically signed by Carlyle Dolly MD  Signature Date/Time: 07/07/2019/3:58:13 PM    Final         Scheduled Meds: . aspirin EC  81 mg Oral Daily  . clonazePAM  0.5 mg Oral BID  . enoxaparin (LOVENOX) injection  30 mg Subcutaneous Q24H  . irbesartan  37.5 mg Oral Daily  . mirabegron ER  25 mg Oral QHS  . sertraline  100 mg Oral Daily   Continuous Infusions:   LOS: 0 days    Time spent: 36mins    Kathie Dike, MD [facility]   If 7PM-7AM, please contact night-coverage [email or web address]  07/07/2019, 8:21 PM

## 2019-07-08 DIAGNOSIS — I6523 Occlusion and stenosis of bilateral carotid arteries: Secondary | ICD-10-CM | POA: Diagnosis not present

## 2019-07-08 DIAGNOSIS — F0390 Unspecified dementia without behavioral disturbance: Secondary | ICD-10-CM | POA: Diagnosis not present

## 2019-07-08 MED ORDER — ACETAMINOPHEN 325 MG PO TABS
650.0000 mg | ORAL_TABLET | Freq: Four times a day (QID) | ORAL | Status: DC | PRN
  Administered 2019-07-08 – 2019-07-09 (×2): 650 mg via ORAL
  Filled 2019-07-08 (×2): qty 2

## 2019-07-08 MED ORDER — ATORVASTATIN CALCIUM 40 MG PO TABS
40.0000 mg | ORAL_TABLET | Freq: Every day | ORAL | Status: DC
  Administered 2019-07-08 – 2019-07-19 (×11): 40 mg via ORAL
  Filled 2019-07-08 (×13): qty 1

## 2019-07-08 MED ORDER — CLOPIDOGREL BISULFATE 75 MG PO TABS
75.0000 mg | ORAL_TABLET | Freq: Every day | ORAL | Status: DC
  Administered 2019-07-08 – 2019-07-19 (×11): 75 mg via ORAL
  Filled 2019-07-08 (×13): qty 1

## 2019-07-08 NOTE — Clinical Social Work Note (Addendum)
Spoke with Janett Billow at [facility]. Advised that patient had been recommended for SNF. Janett Billow to come and assess patient 07/09/19 @ 9 a.m.   Bruin Bolger, Clydene Pugh, LCSW

## 2019-07-08 NOTE — Progress Notes (Signed)
For most of the morning, pt was very sleepy, flat affect and confused to place/time/situation. Would answer basic questions, but not as alert as yesterday. Currently, pt awake and alert, asking why she in the hospital. Updated pt as to her current situation and reason for hospitalization. Pt states, "I am just astonished that I don't remember any of that." Pt then proceeded to talk about her niece, her room at [facility] and how she is sad that she can no longer walk like she used to. Pt states that right now, her right shoulder is hurting, more in the joint and worse with movement. MD notified and ordered received for oral Tylenol.

## 2019-07-08 NOTE — Progress Notes (Signed)
PROGRESS NOTE    Deborah Farrell  GNF:[id number] DOB: February 01, 1917 DOA: 07/06/2019 PCP: Celene Squibb, MD    Brief Narrative:  HPI: Deborah Farrell is a 86 y.o. female with medical history significant for dementia, hypertension, IBS.  Patient was brought to the ED via EMS with reports of altered mental status, patient unable to walk and talk as per her baseline.  Also reports of left facial droop and left-sided weakness.  Last known normal was more than 30 hours ago. At the time of my evaluation, patient is awake alert, able to tell me her name, answer a few simple questions, but she does not know why she is in the hospital.  Per nursing home staff, patient seemed weak and not ambulatory, patient had seem like this since Sunday night.  Patient was evaluated by the supervising doctor today and referred to the ED. Patient tells me she intermittently has pain with urination.  At baseline, patient is incontinent, and ambulatory with a walker.   Assessment & Plan:   Principal Problem:   AMS (altered mental status) Active Problems:   Essential hypertension   Irritable bowel syndrome   Dementia (HCC)   TIA (transient ischemic attack)   Carotid stenosis, bilateral   1. Transient left facial droop/left-sided weakness.  Possibly secondary to TIA.  Symptoms resolved by the time she was admitted to the hospital.  She is not had any recurrence.  MRI brain negative.  She is noted to have bilateral carotid artery stenosis.  At her advanced age and comorbidities, would not anticipate that she would be a candidate for aggressive vascular intervention.  Will discuss with family regarding further work-up.  CT angiogram would be next step in work-up, although she is noted to have allergy to IV dye.  Discussed with patient's niece who agrees that further invasive work-up would not be beneficial.  Reviewed case with neurology, Dr. Merlene Laughter who recommended 3 months of dual antiplatelet therapy followed by single  agent.  Also recommended high intensity statin in light of recent TIA. 2. Dementia.  She is awake and oriented.  Able to answer simple questions.  She resides at a nursing home. 3. Hypertension.  Continue on olmesartan. 4. Disposition.  Patient is a resident of an assisted living facility.  Her home facility will evaluate her in the morning to see if she is appropriate to return.  Physical therapy has evaluated patient and recommended skilled nursing facility.   DVT prophylaxis: enoxaparin (LOVENOX) injection 30 mg Start: 07/07/19 1000  Code Status: DNR Family Communication: None present Disposition Plan: Status is: Observation  The patient remains OBS appropriate and will d/c before 2 midnights.  Patient found to have bilateral carotid artery stenosis.  Will need to discuss further work-up with family if desired.  Dispo: The patient is from: ALF              Anticipated d/c is to: SNF              Anticipated d/c date is: 1 day              Patient currently is not medically stable to d/c.   Consultants:     Procedures:     Antimicrobials:       Subjective: Denies any significant chest pain or shortness of breath.  Does not really engage in conversation.  Objective: Vitals:   07/08/19 0451 07/08/19 0800 07/08/19 1130 07/08/19 2009  BP: (!) 174/71 (!) 149/72 140/62 (!) 119/53  Pulse: 76 72 70 72  Resp: 20 18 18    Temp: 98.7 F (37.1 C) 98.2 F (36.8 C)  98.8 F (37.1 C)  TempSrc: Oral   Oral  SpO2: 94% 96% 97% 96%  Weight:      Height:        Intake/Output Summary (Last 24 hours) at 07/08/2019 2031 Last data filed at 07/08/2019 1800 Gross per 24 hour  Intake 720 ml  Output 1625 ml  Net -905 ml   Filed Weights   07/06/19 2315  Weight: 64.6 kg    Examination:  General exam: Appears calm and comfortable  Respiratory system: Clear to auscultation. Respiratory effort normal. Cardiovascular system: S1 & S2 heard, RRR. No JVD, murmurs, rubs, gallops or  clicks. No pedal edema. Gastrointestinal system: Abdomen is nondistended, soft and nontender. No organomegaly or masses felt. Normal bowel sounds heard. Central nervous system: Alert and oriented. No focal neurological deficits. Extremities: Symmetric 5 x 5 power. Skin: No rashes, lesions or ulcers Psychiatry: Judgement and insight appear normal. Mood & affect appropriate.     Data Reviewed: I have personally reviewed following labs and imaging studies  CBC: Recent Labs  Lab 07/06/19 1713  WBC 10.4  NEUTROABS 7.4  HGB 10.4*  HCT 32.5*  MCV 98.5  PLT 102   Basic Metabolic Panel: Recent Labs  Lab 07/06/19 1713  NA 139  K 3.9  CL 103  CO2 25  GLUCOSE 107*  BUN 27*  CREATININE 1.05*  CALCIUM 9.3  MG 2.0   GFR: Estimated Creatinine Clearance: 27.3 mL/min (A) (by C-G formula based on SCr of 1.05 mg/dL (H)). Liver Function Tests: Recent Labs  Lab 07/06/19 1713  AST 20  ALT 16  ALKPHOS 62  BILITOT 0.5  PROT 7.5  ALBUMIN 4.0   No results for input(s): LIPASE, AMYLASE in the last 168 hours. No results for input(s): AMMONIA in the last 168 hours. Coagulation Profile: Recent Labs  Lab 07/06/19 1713  INR 1.1   Cardiac Enzymes: No results for input(s): CKTOTAL, CKMB, CKMBINDEX, TROPONINI in the last 168 hours. BNP (last 3 results) No results for input(s): PROBNP in the last 8760 hours. HbA1C: No results for input(s): HGBA1C in the last 72 hours. CBG: No results for input(s): GLUCAP in the last 168 hours. Lipid Profile: No results for input(s): CHOL, HDL, LDLCALC, TRIG, CHOLHDL, LDLDIRECT in the last 72 hours. Thyroid Function Tests: Recent Labs    07/06/19 1713  TSH 1.642   Anemia Panel: No results for input(s): VITAMINB12, FOLATE, FERRITIN, TIBC, IRON, RETICCTPCT in the last 72 hours. Sepsis Labs: No results for input(s): PROCALCITON, LATICACIDVEN in the last 168 hours.  Recent Results (from the past 240 hour(s))  SARS Coronavirus 2 by RT PCR  (hospital order, performed in [facility] hospital lab) Nasopharyngeal Nasopharyngeal Swab     Status: None   Collection Time: 07/06/19  5:04 PM   Specimen: Nasopharyngeal Swab  Result Value Ref Range Status   SARS Coronavirus 2 NEGATIVE NEGATIVE Final    Comment: (NOTE) SARS-CoV-2 target nucleic acids are NOT DETECTED.  The SARS-CoV-2 RNA is generally detectable in upper and lower respiratory specimens during the acute phase of infection. The lowest concentration of SARS-CoV-2 viral copies this assay can detect is 250 copies / mL. A negative result does not preclude SARS-CoV-2 infection and should not be used as the sole basis for treatment or other patient management decisions.  A negative result may occur with improper specimen collection / handling, submission  of specimen other than nasopharyngeal swab, presence of viral mutation(s) within the areas targeted by this assay, and inadequate number of viral copies (<250 copies / mL). A negative result must be combined with clinical observations, patient history, and epidemiological information.  Fact Sheet for Patients:   StrictlyIdeas.no  Fact Sheet for Healthcare Providers: BankingDealers.co.za  This test is not yet approved or  cleared by the [place] FDA and has been authorized for detection and/or diagnosis of SARS-CoV-2 by FDA under an Emergency Use Authorization (EUA).  This EUA will remain in effect (meaning this test can be used) for the duration of the COVID-19 declaration under Section 564(b)(1) of the Act, 21 U.S.C. section 360bbb-3(b)(1), unless the authorization is terminated or revoked sooner.  Performed at [facility], [address], [place], [place] [address]   MRSA PCR Screening     Status: None   Collection Time: 07/07/19  5:25 AM   Specimen: Nasal Mucosa; Nasopharyngeal  Result Value Ref Range Status   MRSA by PCR NEGATIVE NEGATIVE Final    Comment:         The GeneXpert MRSA Assay (FDA approved for NASAL specimens only), is one component of a comprehensive MRSA colonization surveillance program. It is not intended to diagnose MRSA infection nor to guide or monitor treatment for MRSA infections. Performed at [facility], [address] [place], [place] [address]          Radiology Studies: MR ANGIO HEAD WO CONTRAST  Result Date: 07/07/2019 CLINICAL DATA:  Altered mental status EXAM: MRI HEAD WITHOUT CONTRAST MRA HEAD WITHOUT CONTRAST TECHNIQUE: Multiplanar, multiecho pulse sequences of the brain and surrounding structures were obtained without intravenous contrast. Angiographic images of the head were obtained using MRA technique without contrast. COMPARISON:  None. FINDINGS: MRI HEAD Motion artifact is present. Brain: There is no acute infarction or intracranial hemorrhage. There is no intracranial mass, mass effect, or edema. There is no hydrocephalus or extra-axial fluid collection. Prominence of the ventricles and sulci reflects generalized parenchymal volume loss. Patchy T2 hyperintensity in the supratentorial white matter is nonspecific but may reflect mild to moderate chronic microvascular ischemic changes. Vascular: Major vessel flow voids at the skull base are preserved. Skull and upper cervical spine: Normal marrow signal is preserved. Sinuses/Orbits: Minor mucosal thickening.  Orbits are unremarkable. Other: Sella is unremarkable.  Mastoid air cells are clear. MRA HEAD Motion artifact is present. Intracranial internal carotid arteries are patent. Proximal middle and anterior cerebral artery segments appear grossly patent but are poorly visualized. Intracranial vertebral arteries, basilar artery, and proximal posterior cerebral arteries are patent. IMPRESSION: Motion degraded studies. No evidence of acute infarction, hemorrhage, or mass. No proximal intracranial vessel occlusion. Electronically Signed   By: Macy Mis M.D.   On:  07/07/2019 13:00   MR BRAIN WO CONTRAST  Result Date: 07/07/2019 CLINICAL DATA:  Altered mental status EXAM: MRI HEAD WITHOUT CONTRAST MRA HEAD WITHOUT CONTRAST TECHNIQUE: Multiplanar, multiecho pulse sequences of the brain and surrounding structures were obtained without intravenous contrast. Angiographic images of the head were obtained using MRA technique without contrast. COMPARISON:  None. FINDINGS: MRI HEAD Motion artifact is present. Brain: There is no acute infarction or intracranial hemorrhage. There is no intracranial mass, mass effect, or edema. There is no hydrocephalus or extra-axial fluid collection. Prominence of the ventricles and sulci reflects generalized parenchymal volume loss. Patchy T2 hyperintensity in the supratentorial white matter is nonspecific but may reflect mild to moderate chronic microvascular ischemic changes. Vascular: Major vessel  flow voids at the skull base are preserved. Skull and upper cervical spine: Normal marrow signal is preserved. Sinuses/Orbits: Minor mucosal thickening.  Orbits are unremarkable. Other: Sella is unremarkable.  Mastoid air cells are clear. MRA HEAD Motion artifact is present. Intracranial internal carotid arteries are patent. Proximal middle and anterior cerebral artery segments appear grossly patent but are poorly visualized. Intracranial vertebral arteries, basilar artery, and proximal posterior cerebral arteries are patent. IMPRESSION: Motion degraded studies. No evidence of acute infarction, hemorrhage, or mass. No proximal intracranial vessel occlusion. Electronically Signed   By: Macy Mis M.D.   On: 07/07/2019 13:00   US Carotid Bilateral  Result Date: 07/07/2019 CLINICAL DATA:  TIA.  History of hypertension. EXAM: BILATERAL CAROTID DUPLEX ULTRASOUND TECHNIQUE: Pearline Cables scale imaging, color Doppler and duplex ultrasound were performed of bilateral carotid and vertebral arteries in the neck. COMPARISON:  None. FINDINGS: Criteria:  Quantification of carotid stenosis is based on velocity parameters that correlate the residual internal carotid diameter with NASCET-based stenosis levels, using the diameter of the distal internal carotid lumen as the denominator for stenosis measurement. The following velocity measurements were obtained: RIGHT ICA: 216/19 cm/sec CCA: 19/4 cm/sec SYSTOLIC ICA/CCA RATIO:  2.2 ECA: 40 cm/sec LEFT ICA: 80/18 cm/sec CCA: 17/40 cm/sec SYSTOLIC ICA/CCA RATIO:  1.2 ECA: 112 cm/sec RIGHT CAROTID ARTERY: There is a large amount of eccentric echogenic plaque within the right carotid bulb (image 21 and 24), extending to involve the origin and proximal aspects of the right internal carotid artery (image 34), which results in elevated peak systolic velocities within the proximal aspect the right internal carotid artery. Greatest acquired peak systolic velocity with the proximal right ICA measures 216 centimeters/second (image 37). RIGHT VERTEBRAL ARTERY:  Antegrade Flow LEFT CAROTID ARTERY: There is a large amount of eccentric echogenic plaque within the left carotid bulb (images 68 and 71), extending to involve the origin and proximal aspects of the left internal carotid artery (image 82), morphologically resulting at least 50% luminal narrowing, though definitely resulting in elevated peak systolic velocities within the interrogated course of the left internal carotid artery to suggest a hemodynamically significant stenosis. LEFT VERTEBRAL ARTERY:  Antegrade flow IMPRESSION: 1. Large amount of right-sided atherosclerotic plaque results in elevated peak systolic velocities within the right internal carotid artery which approach the greater than 70% luminal narrowing range. Further evaluation with CTA could performed as clinically indicated. 2. Large amount of left-sided atherosclerotic plaque, morphologically results in at least 50% luminal narrowing, though does not result in elevated peak systolic velocities within the  interrogated course of the left internal carotid artery to suggest a hemodynamically significant stenosis. Again, further evaluation with CTA could performed as indicated. 3. Antegrade flow demonstrated within the bilateral vertebral arteries. Electronically Signed   By: Sandi Mariscal M.D.   On: 07/07/2019 16:33   ECHOCARDIOGRAM COMPLETE  Result Date: 07/07/2019    ECHOCARDIOGRAM REPORT   Patient Name:   Deborah Farrell Date of Exam: 07/07/2019 Medical Rec #:  [id number]       Height:       66.0 in Accession #:    [id number]      Weight:       142.4 lb Date of Birth:  [birth year]       BSA:          1.731 m Patient Age:    40 years        BP:           137/59 mmHg Patient  Gender: F               HR:           67 bpm. Exam Location:  [facility] Procedure: 2D Echo Indications:    TIA 435.9 / G45.9  History:        Patient has no prior history of Echocardiogram examinations.                 Risk Factors:Hypertension, Dyslipidemia and Non-Smoker.  Sonographer:    [name] RDCS (AE) Referring Phys: [place]  1. Left ventricular ejection fraction, by estimation, is 65 to 70%. The left ventricle has normal function. The left ventricle has no regional wall motion abnormalities. There is moderate left ventricular hypertrophy. Left ventricular diastolic parameters are indeterminate.  2. Right ventricular systolic function is normal. The right ventricular size is normal. There is normal pulmonary artery systolic pressure.  3. The mitral valve is normal in structure. No evidence of mitral valve regurgitation. No evidence of mitral stenosis.  4. The aortic valve is tricuspid. Aortic valve regurgitation is not visualized. No aortic stenosis is present.  5. The inferior vena cava is normal in size with greater than 50% respiratory variability, suggesting right atrial pressure of 3 mmHg. FINDINGS  Left Ventricle: Left ventricular ejection fraction, by estimation, is 65 to 70%. The left ventricle has  normal function. The left ventricle has no regional wall motion abnormalities. The left ventricular internal cavity size was normal in size. There is  moderate left ventricular hypertrophy. Left ventricular diastolic parameters are indeterminate. Right Ventricle: The right ventricular size is normal. Right vetricular wall thickness was not assessed. Right ventricular systolic function is normal. There is normal pulmonary artery systolic pressure. The tricuspid regurgitant velocity is 1.93 m/s, and with an assumed right atrial pressure of 10 mmHg, the estimated right ventricular systolic pressure is 34.7 mmHg. Left Atrium: Left atrial size was normal in size. Right Atrium: Right atrial size was not well visualized. Pericardium: There is no evidence of pericardial effusion. Mitral Valve: The mitral valve is normal in structure. No evidence of mitral valve regurgitation. No evidence of mitral valve stenosis. Tricuspid Valve: The tricuspid valve is normal in structure. Tricuspid valve regurgitation is trivial. No evidence of tricuspid stenosis. Aortic Valve: The aortic valve is tricuspid. Aortic valve regurgitation is not visualized. No aortic stenosis is present. Aortic valve mean gradient measures 2.6 mmHg. Aortic valve peak gradient measures 4.3 mmHg. Aortic valve area, by VTI measures 2.94 cm. Pulmonic Valve: The pulmonic valve was not well visualized. Pulmonic valve regurgitation is not visualized. No evidence of pulmonic stenosis. Aorta: The aortic root is normal in size and structure. Pulmonary Artery: Indeterminant PASP, inadequate TR jet. Venous: The inferior vena cava is normal in size with greater than 50% respiratory variability, suggesting right atrial pressure of 3 mmHg. IAS/Shunts: No atrial level shunt detected by color flow Doppler.  LEFT VENTRICLE PLAX 2D LVIDd:         3.54 cm  Diastology LVIDs:         1.88 cm  LV e' lateral:   7.07 cm/s LV PW:         1.26 cm  LV E/e' lateral: 9.7 LV IVS:         1.37 cm  LV e' medial:    5.44 cm/s LVOT diam:     2.00 cm  LV E/e' medial:  12.7 LV SV:  69 LV SV Index:   40 LVOT Area:     3.14 cm  RIGHT VENTRICLE RV S prime:     14.00 cm/s TAPSE (M-mode): 1.8 cm LEFT ATRIUM           Index       RIGHT ATRIUM           Index LA diam:      3.60 cm 2.08 cm/m  RA Area:     12.20 cm LA Vol (A2C): 30.5 ml 17.62 ml/m RA Volume:   29.70 ml  17.16 ml/m LA Vol (A4C): 38.8 ml 22.41 ml/m  AORTIC VALVE AV Area (Vmax):    2.98 cm AV Area (Vmean):   2.64 cm AV Area (VTI):     2.94 cm AV Vmax:           104.25 cm/s AV Vmean:          76.936 cm/s AV VTI:            0.236 m AV Peak Grad:      4.3 mmHg AV Mean Grad:      2.6 mmHg LVOT Vmax:         98.95 cm/s LVOT Vmean:        64.652 cm/s LVOT VTI:          0.221 m LVOT/AV VTI ratio: 0.94  AORTA Ao Root diam: 3.00 cm MITRAL VALVE               TRICUSPID VALVE MV Area (PHT): 3.03 cm    TR Peak grad:   14.9 mmHg MV Decel Time: 250 msec    TR Vmax:        193.00 cm/s MV E velocity: 68.90 cm/s MV A velocity: 57.70 cm/s  SHUNTS MV E/A ratio:  1.19        Systemic VTI:  0.22 m                            Systemic Diam: 2.00 cm Carlyle Dolly MD Electronically signed by Carlyle Dolly MD Signature Date/Time: 07/07/2019/3:58:13 PM    Final         Scheduled Meds: . aspirin EC  81 mg Oral Daily  . atorvastatin  40 mg Oral Daily  . clonazePAM  0.5 mg Oral BID  . clopidogrel  75 mg Oral Daily  . enoxaparin (LOVENOX) injection  30 mg Subcutaneous Q24H  . irbesartan  37.5 mg Oral Daily  . mirabegron ER  25 mg Oral QHS  . sertraline  100 mg Oral Daily   Continuous Infusions:   LOS: 0 days    Time spent: 35mins    Kathie Dike, MD [facility]   If 7PM-7AM, please contact night-coverage [email or web address]  07/08/2019, 8:31 PM

## 2019-07-09 DIAGNOSIS — Z882 Allergy status to sulfonamides status: Secondary | ICD-10-CM | POA: Diagnosis not present

## 2019-07-09 DIAGNOSIS — Z515 Encounter for palliative care: Secondary | ICD-10-CM | POA: Diagnosis not present

## 2019-07-09 DIAGNOSIS — F0390 Unspecified dementia without behavioral disturbance: Secondary | ICD-10-CM | POA: Diagnosis present

## 2019-07-09 DIAGNOSIS — R7881 Bacteremia: Secondary | ICD-10-CM | POA: Diagnosis not present

## 2019-07-09 DIAGNOSIS — G92 Toxic encephalopathy: Secondary | ICD-10-CM | POA: Diagnosis not present

## 2019-07-09 DIAGNOSIS — R32 Unspecified urinary incontinence: Secondary | ICD-10-CM | POA: Diagnosis present

## 2019-07-09 DIAGNOSIS — Z79899 Other long term (current) drug therapy: Secondary | ICD-10-CM | POA: Diagnosis not present

## 2019-07-09 DIAGNOSIS — A411 Sepsis due to other specified staphylococcus: Secondary | ICD-10-CM | POA: Diagnosis not present

## 2019-07-09 DIAGNOSIS — Z7189 Other specified counseling: Secondary | ICD-10-CM | POA: Diagnosis not present

## 2019-07-09 DIAGNOSIS — R4182 Altered mental status, unspecified: Secondary | ICD-10-CM | POA: Diagnosis not present

## 2019-07-09 DIAGNOSIS — R29810 Facial weakness: Secondary | ICD-10-CM | POA: Diagnosis present

## 2019-07-09 DIAGNOSIS — Z888 Allergy status to other drugs, medicaments and biological substances status: Secondary | ICD-10-CM | POA: Diagnosis not present

## 2019-07-09 DIAGNOSIS — D72823 Leukemoid reaction: Secondary | ICD-10-CM | POA: Diagnosis not present

## 2019-07-09 DIAGNOSIS — G459 Transient cerebral ischemic attack, unspecified: Secondary | ICD-10-CM | POA: Diagnosis present

## 2019-07-09 DIAGNOSIS — I10 Essential (primary) hypertension: Secondary | ICD-10-CM | POA: Diagnosis present

## 2019-07-09 DIAGNOSIS — Z8249 Family history of ischemic heart disease and other diseases of the circulatory system: Secondary | ICD-10-CM | POA: Diagnosis not present

## 2019-07-09 DIAGNOSIS — R9431 Abnormal electrocardiogram [ECG] [EKG]: Secondary | ICD-10-CM | POA: Diagnosis present

## 2019-07-09 DIAGNOSIS — Z823 Family history of stroke: Secondary | ICD-10-CM | POA: Diagnosis not present

## 2019-07-09 DIAGNOSIS — K589 Irritable bowel syndrome without diarrhea: Secondary | ICD-10-CM | POA: Diagnosis present

## 2019-07-09 DIAGNOSIS — Z91041 Radiographic dye allergy status: Secondary | ICD-10-CM | POA: Diagnosis not present

## 2019-07-09 DIAGNOSIS — Z66 Do not resuscitate: Secondary | ICD-10-CM | POA: Diagnosis present

## 2019-07-09 DIAGNOSIS — Z885 Allergy status to narcotic agent status: Secondary | ICD-10-CM | POA: Diagnosis not present

## 2019-07-09 DIAGNOSIS — G8194 Hemiplegia, unspecified affecting left nondominant side: Secondary | ICD-10-CM | POA: Diagnosis present

## 2019-07-09 DIAGNOSIS — H409 Unspecified glaucoma: Secondary | ICD-10-CM | POA: Diagnosis present

## 2019-07-09 DIAGNOSIS — Z90710 Acquired absence of both cervix and uterus: Secondary | ICD-10-CM | POA: Diagnosis not present

## 2019-07-09 DIAGNOSIS — Z9049 Acquired absence of other specified parts of digestive tract: Secondary | ICD-10-CM | POA: Diagnosis not present

## 2019-07-09 DIAGNOSIS — I6523 Occlusion and stenosis of bilateral carotid arteries: Secondary | ICD-10-CM | POA: Diagnosis present

## 2019-07-09 DIAGNOSIS — Z20822 Contact with and (suspected) exposure to covid-19: Secondary | ICD-10-CM | POA: Diagnosis present

## 2019-07-09 MED ORDER — SIMETHICONE 40 MG/0.6ML PO SUSP
ORAL | Status: AC
  Filled 2019-07-09: qty 0.6

## 2019-07-09 NOTE — Progress Notes (Signed)
Physical Therapy Treatment Patient Details Name: Deborah Farrell MRN: [id number] DOB: 1917/11/20 Today's Date: 07/09/2019    History of Present Illness Deborah Farrell is a 86 y.o. female with medical history significant for dementia, hypertension, IBS.  Patient was brought to the ED via EMS with reports of altered mental status, patient unable to walk and talk as per her baseline.  Also reports of left facial droop and left-sided weakness.  Last known normal was more than 30 hours ago.At the time of my evaluation, patient is awake alert, able to tell me her name, answer a few simple questions, but she does not know why she is in the hospital.  Per nursing home staff, patient seemed weak and not ambulatory, patient had seem like this since Sunday night.  Patient was evaluated by the supervising doctor today and referred to the ED.Patient tells me she intermittently has pain with urination.  At baseline, patient is incontinent, and ambulatory with a walker.    PT Comments    Patient requires mod assist to transition to seated EOB. She demonstrates good sitting tolerance today but requires assist for unsupported sitting with fatigue. She is able to complete exercises EOB with min/mod verbal cueing. Patient fatigues rather quickly today from sitting and exercises at bedside demonstrating impaired activity tolerance. Patient will benefit from continued physical therapy in hospital and recommended venue below to increase strength, balance, endurance for safe ADLs and gait.    Follow Up Recommendations  SNF;Supervision for mobility/OOB;Supervision - Intermittent     Equipment Recommendations  None recommended by PT    Recommendations for Other Services       Precautions / Restrictions Precautions Precautions: Fall Restrictions Weight Bearing Restrictions: No    Mobility  Bed Mobility Overal bed mobility: Needs Assistance Bed Mobility: Supine to Sit;Sit to Supine     Supine to sit: Mod  assist Sit to supine: Mod assist   General bed mobility comments: slow labored movement  Transfers                    Ambulation/Gait                 Stairs             Wheelchair Mobility    Modified Rankin (Stroke Patients Only)       Balance Overall balance assessment: Needs assistance Sitting-balance support: Feet supported;No upper extremity supported Sitting balance-Leahy Scale: Poor Sitting balance - Comments: seated at EOB                                    Cognition Arousal/Alertness: Awake/alert Behavior During Therapy: WFL for tasks assessed/performed Overall Cognitive Status: History of cognitive impairments - at baseline                                        Exercises General Exercises - Lower Extremity Ankle Circles/Pumps: AROM;10 reps;Seated;Both Long Arc Quad: AROM;Both;10 reps;Seated Hip Flexion/Marching: AROM;Both;10 reps;Seated    General Comments        Pertinent Vitals/Pain Pain Assessment: No/denies pain    Home Living                      Prior Function            PT Goals (  current goals can now be found in the care plan section) Acute Rehab PT Goals Patient Stated Goal: return home with assistance PT Goal Formulation: With patient/family Time For Goal Achievement: 07/21/19 Potential to Achieve Goals: Good Progress towards PT goals: Progressing toward goals    Frequency    Min 3X/week      PT Plan Current plan remains appropriate    Co-evaluation              AM-PAC PT "6 Clicks" Mobility   Outcome Measure  Help needed turning from your back to your side while in a flat bed without using bedrails?: A Lot Help needed moving from lying on your back to sitting on the side of a flat bed without using bedrails?: A Lot Help needed moving to and from a bed to a chair (including a wheelchair)?: A Lot Help needed standing up from a chair using your arms  (e.g., wheelchair or bedside chair)?: A Lot Help needed to walk in hospital room?: A Lot Help needed climbing 3-5 steps with a railing? : Total 6 Click Score: 11    End of Session   Activity Tolerance: Patient tolerated treatment well;Patient limited by fatigue Patient left: in bed;with call bell/phone within reach;with bed alarm set Nurse Communication: Mobility status PT Visit Diagnosis: Unsteadiness on feet (R26.81);Other abnormalities of gait and mobility (R26.89);Muscle weakness (generalized) (M62.81)     Time: 4944-9675 PT Time Calculation (min) (ACUTE ONLY): 12 min  Charges:  $Therapeutic Activity: 8-22 mins                    12:33 PM, 07/09/19 Mearl Latin PT, DPT Physical Therapist at [facility]

## 2019-07-09 NOTE — Plan of Care (Signed)
  Problem: Nutrition: Goal: Adequate nutrition will be maintained Outcome: Progressing   Problem: Elimination: Goal: Will not experience complications related to bowel motility Outcome: Progressing   Problem: Safety: Goal: Ability to remain free from injury will improve Outcome: Progressing   Problem: Skin Integrity: Goal: Risk for impaired skin integrity will decrease Outcome: Progressing   

## 2019-07-09 NOTE — Progress Notes (Signed)
PROGRESS NOTE    Deborah Farrell  TKW:[id number] DOB: [birth year] DOA: 07/06/2019 PCP: Celene Squibb, MD    Brief Narrative:  HPI: Deborah Farrell is a 86 y.o. female with medical history significant for dementia, hypertension, IBS.  Patient was brought to the ED via EMS with reports of altered mental status, patient unable to walk and talk as per her baseline.  Also reports of left facial droop and left-sided weakness.  Last known normal was more than 30 hours ago. At the time of my evaluation, patient is awake alert, able to tell me her name, answer a few simple questions, but she does not know why she is in the hospital.  Per nursing home staff, patient seemed weak and not ambulatory, patient had seem like this since Sunday night.  Patient was evaluated by the supervising doctor today and referred to the ED. Patient tells me she intermittently has pain with urination.  At baseline, patient is incontinent, and ambulatory with a walker.   Assessment & Plan:   Principal Problem:   AMS (altered mental status) Active Problems:   Essential hypertension   Irritable bowel syndrome   Dementia (HCC)   TIA (transient ischemic attack)   Carotid stenosis, bilateral   1. Transient left facial droop/left-sided weakness.  Possibly secondary to TIA.  Symptoms resolved by the time she was admitted to the hospital.  She is not had any recurrence.  MRI brain negative.  She is noted to have bilateral carotid artery stenosis.  At her advanced age and comorbidities, would not anticipate that she would be a candidate for aggressive vascular intervention.  Will discuss with family regarding further work-up.  CT angiogram would be next step in work-up, although she is noted to have allergy to IV dye.  Discussed with patient's niece who agrees that further invasive work-up would not be beneficial.  Reviewed case with neurology, Dr. Merlene Laughter who recommended 3 months of dual antiplatelet therapy followed by single  agent.  Also recommended high intensity statin in light of recent TIA. 2. Dementia.  She is awake and oriented.  Able to answer simple questions.  She resides at a nursing home. 3. Hypertension.  Continue on olmesartan. 4. Disposition.  Patient is a resident of an assisted living facility.  Seen by physical therapy and due to level of assistance that is needed, they are recommending skilled nursing facility.  TOC assisting with discharge planning.   DVT prophylaxis: enoxaparin (LOVENOX) injection 30 mg Start: 07/07/19 1000  Code Status: DNR Family Communication: discussed with niece 6/25 Disposition Plan: Status is: Inpatient  The patient will require care spanning > 2 midnights and should be moved to inpatient because: Unsafe d/c plan    Dispo: The patient is from: ALF              Anticipated d/c is to: SNF              Anticipated d/c date is: 1 day              Patient currently is not medically stable to d/c.   Consultants:     Procedures:     Antimicrobials:       Subjective: She is confused.  Denies any new complaints.  Objective: Vitals:   07/08/19 2012 07/09/19 0006 07/09/19 0503 07/09/19 1559  BP:  (!) 185/85 (!) 144/72 (!) 130/59  Pulse:  70 70 74  Resp:    20  Temp:  98 F (36.7 C)  97.9 F (36.6 C) 98.1 F (36.7 C)  TempSrc:  Oral Oral Oral  SpO2: 92% 99% 98% 98%  Weight:      Height:       No intake or output data in the 24 hours ending 07/09/19 1849 Filed Weights   07/06/19 2315  Weight: 64.6 kg    Examination:  General exam: Appears calm and comfortable  Respiratory system: Clear to auscultation. Respiratory effort normal. Cardiovascular system: S1 & S2 heard, RRR. No JVD, murmurs, rubs, gallops or clicks. No pedal edema.     Data Reviewed: I have personally reviewed following labs and imaging studies  CBC: Recent Labs  Lab 07/06/19 1713  WBC 10.4  NEUTROABS 7.4  HGB 10.4*  HCT 32.5*  MCV 98.5  PLT 267   Basic Metabolic  Panel: Recent Labs  Lab 07/06/19 1713  NA 139  K 3.9  CL 103  CO2 25  GLUCOSE 107*  BUN 27*  CREATININE 1.05*  CALCIUM 9.3  MG 2.0   GFR: Estimated Creatinine Clearance: 27.3 mL/min (A) (by C-G formula based on SCr of 1.05 mg/dL (H)). Liver Function Tests: Recent Labs  Lab 07/06/19 1713  AST 20  ALT 16  ALKPHOS 62  BILITOT 0.5  PROT 7.5  ALBUMIN 4.0   No results for input(s): LIPASE, AMYLASE in the last 168 hours. No results for input(s): AMMONIA in the last 168 hours. Coagulation Profile: Recent Labs  Lab 07/06/19 1713  INR 1.1   Cardiac Enzymes: No results for input(s): CKTOTAL, CKMB, CKMBINDEX, TROPONINI in the last 168 hours. BNP (last 3 results) No results for input(s): PROBNP in the last 8760 hours. HbA1C: No results for input(s): HGBA1C in the last 72 hours. CBG: No results for input(s): GLUCAP in the last 168 hours. Lipid Profile: No results for input(s): CHOL, HDL, LDLCALC, TRIG, CHOLHDL, LDLDIRECT in the last 72 hours. Thyroid Function Tests: No results for input(s): TSH, T4TOTAL, FREET4, T3FREE, THYROIDAB in the last 72 hours. Anemia Panel: No results for input(s): VITAMINB12, FOLATE, FERRITIN, TIBC, IRON, RETICCTPCT in the last 72 hours. Sepsis Labs: No results for input(s): PROCALCITON, LATICACIDVEN in the last 168 hours.  Recent Results (from the past 240 hour(s))  SARS Coronavirus 2 by RT PCR (hospital order, performed in [facility] hospital lab) Nasopharyngeal Nasopharyngeal Swab     Status: None   Collection Time: 07/06/19  5:04 PM   Specimen: Nasopharyngeal Swab  Result Value Ref Range Status   SARS Coronavirus 2 NEGATIVE NEGATIVE Final    Comment: (NOTE) SARS-CoV-2 target nucleic acids are NOT DETECTED.  The SARS-CoV-2 RNA is generally detectable in upper and lower respiratory specimens during the acute phase of infection. The lowest concentration of SARS-CoV-2 viral copies this assay can detect is 250 copies / mL. A negative result  does not preclude SARS-CoV-2 infection and should not be used as the sole basis for treatment or other patient management decisions.  A negative result may occur with improper specimen collection / handling, submission of specimen other than nasopharyngeal swab, presence of viral mutation(s) within the areas targeted by this assay, and inadequate number of viral copies (<250 copies / mL). A negative result must be combined with clinical observations, patient history, and epidemiological information.  Fact Sheet for Patients:   StrictlyIdeas.no  Fact Sheet for Healthcare Providers: BankingDealers.co.za  This test is not yet approved or  cleared by the [place] FDA and has been authorized for detection and/or diagnosis of SARS-CoV-2 by FDA under an Emergency  Use Authorization (EUA).  This EUA will remain in effect (meaning this test can be used) for the duration of the COVID-19 declaration under Section 564(b)(1) of the Act, 21 U.S.C. section 360bbb-3(b)(1), unless the authorization is terminated or revoked sooner.  Performed at [facility], [address], [place], [place] [address]   MRSA PCR Screening     Status: None   Collection Time: 07/07/19  5:25 AM   Specimen: Nasal Mucosa; Nasopharyngeal  Result Value Ref Range Status   MRSA by PCR NEGATIVE NEGATIVE Final    Comment:        The GeneXpert MRSA Assay (FDA approved for NASAL specimens only), is one component of a comprehensive MRSA colonization surveillance program. It is not intended to diagnose MRSA infection nor to guide or monitor treatment for MRSA infections. Performed at [facility], [address], [place], [place] [address]          Radiology Studies: No results found.      Scheduled Meds: . aspirin EC  81 mg Oral Daily  . atorvastatin  40 mg Oral Daily  . clonazePAM  0.5 mg Oral BID  . clopidogrel  75 mg Oral Daily  . enoxaparin (LOVENOX)  injection  30 mg Subcutaneous Q24H  . irbesartan  37.5 mg Oral Daily  . mirabegron ER  25 mg Oral QHS  . sertraline  100 mg Oral Daily  . simethicone       Continuous Infusions:   LOS: 0 days    Time spent: 64mins    Kathie Dike, MD [facility]   If 7PM-7AM, please contact night-coverage [email or web address]  07/09/2019, 6:49 PM

## 2019-07-09 NOTE — NC FL2 (Signed)
[place] LEVEL OF CARE SCREENING TOOL     IDENTIFICATION  Patient Name: Deborah Farrell Birthdate: 08-11-1917 Sex: female Admission Date (Current Location): 07/06/2019  [facility] and [place] Number:  [employer] and Address:  [place] [address] [place]      Provider Number: [id number]  Attending Physician Name and Address:  Kathie Dike, MD  Relative Name and Phone Number:  Boxwell,Nancy Niece [phone]   [phone]    Current Level of Care: Other (Comment) (observation) Recommended Level of Care: [place] Prior Approval Number:    Date Approved/Denied:   PASRR Number: [id number]  Discharge Plan: SNF    Current Diagnoses: Patient Active Problem List   Diagnosis Date Noted  . TIA (transient ischemic attack) 07/07/2019  . Carotid stenosis, bilateral 07/07/2019  . AMS (altered mental status) 07/06/2019  . Acute gastroenteritis 02/05/2018  . Dehydration 02/05/2018  . Elevated liver enzymes 02/05/2018  . Low grade fever 02/05/2018  . Dementia ([place]) 02/05/2018  . Diverticulosis 02/05/2018  . Generalized weakness 02/05/2018  . Hx of adenomatous colonic polyps 10/21/2017  . Vulvar discomfort 08/07/2017  . Nocturia 07/27/2014  . Recurrent cystitis 07/08/2013  . Atrophic vaginitis 07/08/2013  . Internal hemorrhoids with complication 20/25/4270  . Stiffness of joints, not elsewhere classified, multiple sites 11/19/2012  . Poor balance 11/19/2012  . Hip pain, right 11/20/2011  . Back pain, lumbosacral 11/20/2011  . Dyspepsia 07/19/2011  . Diarrhea 07/19/2011  . Tubulovillous adenoma   . Hyperlipidemia 08/20/2007  . Unspecified glaucoma 08/20/2007  . Essential hypertension 08/20/2007  . Gastroesophageal reflux disease 08/20/2007  . Constipation 08/20/2007  . Irritable bowel syndrome 08/20/2007    Orientation RESPIRATION BLADDER Height & Weight     Self  Normal Incontinent Weight: 142  lb 6.7 oz (64.6 kg) Height:  5\' 6"  (167.6 cm)  BEHAVIORAL SYMPTOMS/MOOD NEUROLOGICAL BOWEL NUTRITION STATUS      Incontinent Diet  AMBULATORY STATUS COMMUNICATION OF NEEDS Skin   Extensive Assist Verbally Normal                       Personal Care Assistance Level of Assistance  Bathing, Dressing, Feeding Bathing Assistance: Maximum assistance Feeding assistance: Limited assistance Dressing Assistance: Maximum assistance     Functional Limitations Info  Sight, Hearing, Speech Sight Info: Adequate Hearing Info: Adequate Speech Info: Adequate    SPECIAL CARE FACTORS FREQUENCY  PT (By licensed PT)     PT Frequency: 5x/week              Contractures Contractures Info: Not present    Additional Factors Info  Code Status, Allergies Code Status Info: DNR Allergies Info: Tramadol, Aciphex, Codeine, Colchicine, Dicyclomine Hcl, Hyoscyamine Sulfate, Iodinated Diagnostic Agents, Nexium, Omeprazole, Sulfonamide Derivatives           Current Medications (07/09/2019):  This is the current hospital active medication list Current Facility-Administered Medications  Medication Dose Route Frequency Provider Last Rate Last Admin  . acetaminophen (TYLENOL) tablet 650 mg  650 mg Oral Q6H PRN Kathie Dike, MD   650 mg at 07/08/19 1839  . aspirin EC tablet 81 mg  81 mg Oral Daily Emokpae, Ejiroghene E, MD   81 mg at 07/09/19 0857  . atorvastatin (LIPITOR) tablet 40 mg  40 mg Oral Daily Kathie Dike, MD   40 mg at 07/09/19 0857  . clonazePAM (KLONOPIN) tablet 0.5 mg  0.5 mg Oral BID Emokpae, Leanne Chang, MD  0.5 mg at 07/09/19 0857  . clopidogrel (PLAVIX) tablet 75 mg  75 mg Oral Daily Kathie Dike, MD   75 mg at 07/09/19 0857  . enoxaparin (LOVENOX) injection 30 mg  30 mg Subcutaneous Q24H Emokpae, Ejiroghene E, MD   30 mg at 07/09/19 0858  . irbesartan (AVAPRO) tablet 37.5 mg  37.5 mg Oral Daily Emokpae, Ejiroghene E, MD   37.5 mg at 07/09/19 0856  . mirabegron ER  (MYRBETRIQ) tablet 25 mg  25 mg Oral QHS Emokpae, Ejiroghene E, MD   25 mg at 07/08/19 2110  . polyethylene glycol (MIRALAX / GLYCOLAX) packet 17 g  17 g Oral Daily PRN Emokpae, Ejiroghene E, MD   17 g at 07/08/19 1839  . sertraline (ZOLOFT) tablet 100 mg  100 mg Oral Daily Emokpae, Ejiroghene E, MD   100 mg at 07/09/19 0856  . simethicone (MYLICON) 40 QR/9.7JO suspension              Discharge Medications: Please see discharge summary for a list of discharge medications.  Relevant Imaging Results:  Relevant Lab Results:   Additional Information SSN [id number]. Patient received COVID vaccine at [place] per [facility] staff.  Rj Pedrosa, Clydene Pugh, LCSW

## 2019-07-10 MED ORDER — LACTATED RINGERS IV SOLN: INTRAVENOUS | Status: DC

## 2019-07-10 NOTE — Progress Notes (Signed)
PROGRESS NOTE    Deborah Farrell  IWL:[id number] DOB: 1917/05/01 DOA: 07/06/2019 PCP: Celene Squibb, MD    Brief Narrative:  HPI: Deborah Farrell is a 86 y.o. female with medical history significant for dementia, hypertension, IBS.  Patient was brought to the ED via EMS with reports of altered mental status, patient unable to walk and talk as per her baseline.  Also reports of left facial droop and left-sided weakness.  Last known normal was more than 30 hours ago. At the time of my evaluation, patient is awake alert, able to tell me her name, answer a few simple questions, but she does not know why she is in the hospital.  Per nursing home staff, patient seemed weak and not ambulatory, patient had seem like this since Sunday night.  Patient was evaluated by the supervising doctor today and referred to the ED. Patient tells me she intermittently has pain with urination.  At baseline, patient is incontinent, and ambulatory with a walker.   Assessment & Plan:   Principal Problem:   AMS (altered mental status) Active Problems:   Essential hypertension   Irritable bowel syndrome   Dementia (HCC)   TIA (transient ischemic attack)   Carotid stenosis, bilateral   1. Transient left facial droop/left-sided weakness.  Possibly secondary to TIA.  Symptoms resolved by the time she was admitted to the hospital.  She is not had any recurrence.  MRI brain negative.  She is noted to have bilateral carotid artery stenosis.  At her advanced age and comorbidities, would not anticipate that she would be a candidate for aggressive vascular intervention.  Will discuss with family regarding further work-up.  CT angiogram would be next step in work-up, although she is noted to have allergy to IV dye.  Discussed with patient's niece who agrees that further invasive work-up would not be beneficial.  Reviewed case with neurology, Dr. Merlene Laughter who recommended 3 months of dual antiplatelet therapy followed by single  agent.  Also recommended high intensity statin in light of recent TIA. 2. Dementia.  She is awake and oriented.  Able to answer simple questions.  She resides at a nursing home. 3. Hypertension.  Continue on olmesartan. 4. Disposition.  Patient is a resident of an assisted living facility.  Seen by physical therapy and due to level of assistance that is needed, they are recommending skilled nursing facility.  TOC assisting with discharge planning.   DVT prophylaxis: enoxaparin (LOVENOX) injection 30 mg Start: 07/07/19 1000  Code Status: DNR Family Communication: discussed with niece 6/25 Disposition Plan: Status is: Inpatient  The patient will require care spanning > 2 midnights and should be moved to inpatient because: Unsafe d/c plan    Dispo: The patient is from: ALF              Anticipated d/c is to: SNF              Anticipated d/c date is: 1 day              Patient currently is not medically stable to d/c.   Consultants:     Procedures:     Antimicrobials:       Subjective: Somnolent, is not in any distress, no complaints.  Objective: Vitals:   07/09/19 2025 07/10/19 0519 07/10/19 1000 07/10/19 1300  BP: (!) 126/44 (!) 144/69 (!) 143/53 135/61  Pulse: 76 84 87 82  Resp: 16 17 20 17   Temp: (!) 97.5 F (36.4 C)  98.2 F (36.8 C) 99.6 F (37.6 C) 98.1 F (36.7 C)  TempSrc: Axillary Axillary Axillary Axillary  SpO2: 97% 97% 96% 97%  Weight:      Height:        Intake/Output Summary (Last 24 hours) at 07/10/2019 1920 Last data filed at 07/10/2019 1531 Gross per 24 hour  Intake 824.14 ml  Output 300 ml  Net 524.14 ml   Filed Weights   07/06/19 2315  Weight: 64.6 kg    Examination:  General exam: Appears calm and comfortable  Respiratory system: Clear to auscultation. Respiratory effort normal. Cardiovascular system: S1 & S2 heard, RRR. No JVD, murmurs, rubs, gallops or clicks. No pedal edema.     Data Reviewed: I have personally reviewed  following labs and imaging studies  CBC: Recent Labs  Lab 07/06/19 1713  WBC 10.4  NEUTROABS 7.4  HGB 10.4*  HCT 32.5*  MCV 98.5  PLT 270   Basic Metabolic Panel: Recent Labs  Lab 07/06/19 1713  NA 139  K 3.9  CL 103  CO2 25  GLUCOSE 107*  BUN 27*  CREATININE 1.05*  CALCIUM 9.3  MG 2.0   GFR: Estimated Creatinine Clearance: 27.3 mL/min (A) (by C-G formula based on SCr of 1.05 mg/dL (H)). Liver Function Tests: Recent Labs  Lab 07/06/19 1713  AST 20  ALT 16  ALKPHOS 62  BILITOT 0.5  PROT 7.5  ALBUMIN 4.0   No results for input(s): LIPASE, AMYLASE in the last 168 hours. No results for input(s): AMMONIA in the last 168 hours. Coagulation Profile: Recent Labs  Lab 07/06/19 1713  INR 1.1   Cardiac Enzymes: No results for input(s): CKTOTAL, CKMB, CKMBINDEX, TROPONINI in the last 168 hours. BNP (last 3 results) No results for input(s): PROBNP in the last 8760 hours. HbA1C: No results for input(s): HGBA1C in the last 72 hours. CBG: No results for input(s): GLUCAP in the last 168 hours. Lipid Profile: No results for input(s): CHOL, HDL, LDLCALC, TRIG, CHOLHDL, LDLDIRECT in the last 72 hours. Thyroid Function Tests: No results for input(s): TSH, T4TOTAL, FREET4, T3FREE, THYROIDAB in the last 72 hours. Anemia Panel: No results for input(s): VITAMINB12, FOLATE, FERRITIN, TIBC, IRON, RETICCTPCT in the last 72 hours. Sepsis Labs: No results for input(s): PROCALCITON, LATICACIDVEN in the last 168 hours.  Recent Results (from the past 240 hour(s))  SARS Coronavirus 2 by RT PCR (hospital order, performed in [facility] hospital lab) Nasopharyngeal Nasopharyngeal Swab     Status: None   Collection Time: 07/06/19  5:04 PM   Specimen: Nasopharyngeal Swab  Result Value Ref Range Status   SARS Coronavirus 2 NEGATIVE NEGATIVE Final    Comment: (NOTE) SARS-CoV-2 target nucleic acids are NOT DETECTED.  The SARS-CoV-2 RNA is generally detectable in upper and  lower respiratory specimens during the acute phase of infection. The lowest concentration of SARS-CoV-2 viral copies this assay can detect is 250 copies / mL. A negative result does not preclude SARS-CoV-2 infection and should not be used as the sole basis for treatment or other patient management decisions.  A negative result may occur with improper specimen collection / handling, submission of specimen other than nasopharyngeal swab, presence of viral mutation(s) within the areas targeted by this assay, and inadequate number of viral copies (<250 copies / mL). A negative result must be combined with clinical observations, patient history, and epidemiological information.  Fact Sheet for Patients:   StrictlyIdeas.no  Fact Sheet for Healthcare Providers: BankingDealers.co.za  This test is not  yet approved or  cleared by the [place] and has been authorized for detection and/or diagnosis of SARS-CoV-2 by FDA under an Emergency Use Authorization (EUA).  This EUA will remain in effect (meaning this test can be used) for the duration of the COVID-19 declaration under Section 564(b)(1) of the Act, 21 U.S.C. section 360bbb-3(b)(1), unless the authorization is terminated or revoked sooner.  Performed at [facility], [address] [place], [place] [address]   MRSA PCR Screening     Status: None   Collection Time: 07/07/19  5:25 AM   Specimen: Nasal Mucosa; Nasopharyngeal  Result Value Ref Range Status   MRSA by PCR NEGATIVE NEGATIVE Final    Comment:        The GeneXpert MRSA Assay (FDA approved for NASAL specimens only), is one component of a comprehensive MRSA colonization surveillance program. It is not intended to diagnose MRSA infection nor to guide or monitor treatment for MRSA infections. Performed at [facility], [address], [place], [place] [address]          Radiology Studies: No results  found.      Scheduled Meds: . aspirin EC  81 mg Oral Daily  . atorvastatin  40 mg Oral Daily  . clonazePAM  0.5 mg Oral BID  . clopidogrel  75 mg Oral Daily  . enoxaparin (LOVENOX) injection  30 mg Subcutaneous Q24H  . irbesartan  37.5 mg Oral Daily  . mirabegron ER  25 mg Oral QHS  . sertraline  100 mg Oral Daily   Continuous Infusions: . lactated ringers 75 mL/hr at 07/10/19 1503     LOS: 1 day    Time spent: 58mins    Kathie Dike, MD [facility]   If 7PM-7AM, please contact night-coverage [email or web address]  07/10/2019, 7:20 PM

## 2019-07-10 NOTE — TOC Progression Note (Signed)
Transition of Care [facility]) - Progression Note    Patient Details  Name: Deborah Farrell MRN: [id number] Date of Birth: 03-26-1917  [facility] CM/SW Contact  Natasha Bence, LCSW Phone Number: 07/10/2019, 1:02 PM  Clinical Narrative:    Patient's niece agreeable to [facility] SNF placement. CSW called [facility] to notify them of Bed offer from [facility]. TOC to follow.      Barriers to Discharge: Barriers Resolved    Readmission Risk Interventions No flowsheet data found.

## 2019-07-11 NOTE — Progress Notes (Signed)
PROGRESS NOTE    Deborah Farrell  IRS:[id number] DOB: Mar 18, 1917 DOA: 07/06/2019 PCP: Celene Squibb, MD    Brief Narrative:  HPI: Deborah Farrell is a 86 y.o. female with medical history significant for dementia, hypertension, IBS.  Patient was brought to the ED via EMS with reports of altered mental status, patient unable to walk and talk as per her baseline.  Also reports of left facial droop and left-sided weakness.  Last known normal was more than 30 hours ago. At the time of my evaluation, patient is awake alert, able to tell me her name, answer a few simple questions, but she does not know why she is in the hospital.  Per nursing home staff, patient seemed weak and not ambulatory, patient had seem like this since Sunday night.  Patient was evaluated by the supervising doctor today and referred to the ED. Patient tells me she intermittently has pain with urination.  At baseline, patient is incontinent, and ambulatory with a walker.   Assessment & Plan:   Principal Problem:   AMS (altered mental status) Active Problems:   Essential hypertension   Irritable bowel syndrome   Dementia (HCC)   TIA (transient ischemic attack)   Carotid stenosis, bilateral   1. Transient left facial droop/left-sided weakness.  Possibly secondary to TIA.  Symptoms resolved by the time she was admitted to the hospital.  She is not had any recurrence.  MRI brain negative.  She is noted to have bilateral carotid artery stenosis.  At her advanced age and comorbidities, would not anticipate that she would be a candidate for aggressive vascular intervention.  Will discuss with family regarding further work-up.  CT angiogram would be next step in work-up, although she is noted to have allergy to IV dye.  Discussed with patient's niece who agrees that further invasive work-up would not be beneficial.  Reviewed case with neurology, Dr. Merlene Laughter who recommended 3 months of dual antiplatelet therapy followed by single  agent.  Also recommended high intensity statin in light of recent TIA. 2. Dementia.  She is awake and oriented.  Able to answer simple questions.  She resides at a nursing home. 3. Hypertension.  Continue on olmesartan. 4. Disposition.  Patient is a resident of an assisted living facility.  Seen by physical therapy and due to level of assistance that is needed, they are recommending skilled nursing facility.  TOC assisting with discharge planning.   DVT prophylaxis: enoxaparin (LOVENOX) injection 30 mg Start: 07/07/19 1000  Code Status: DNR Family Communication: discussed with niece 6/25 Disposition Plan: Status is: Inpatient  The patient will require care spanning > 2 midnights and should be moved to inpatient because: Unsafe d/c plan.  Patient is stable for discharge to skilled nursing facility once a bed is available  Dispo: The patient is from: ALF              Anticipated d/c is to: SNF              Anticipated d/c date is: 1 day              Patient currently is not medically stable to d/c.   Consultants:     Procedures:     Antimicrobials:       Subjective: Wakes up to voice, denies any complaints.  Objective: Vitals:   07/10/19 2049 07/10/19 2143 07/11/19 0500 07/11/19 1357  BP:  (!) 150/62 138/60 131/67  Pulse:  91 68 79  Resp:  17 16 17   Temp:  100.2 F (37.9 C) 99 F (37.2 C) 98.7 F (37.1 C)  TempSrc:  Oral Oral Oral  SpO2: 91% 98% 99% 97%  Weight:      Height:        Intake/Output Summary (Last 24 hours) at 07/11/2019 1701 Last data filed at 07/11/2019 1500 Gross per 24 hour  Intake 1880.46 ml  Output 500 ml  Net 1380.46 ml   Filed Weights   07/06/19 2315  Weight: 64.6 kg    Examination:  General exam: Alert, awake, oriented x 3 Respiratory system: Clear to auscultation. Respiratory effort normal. Cardiovascular system:RRR. No murmurs, rubs, gallops.  Data Reviewed: I have personally reviewed following labs and imaging  studies  CBC: Recent Labs  Lab 07/06/19 1713  WBC 10.4  NEUTROABS 7.4  HGB 10.4*  HCT 32.5*  MCV 98.5  PLT 683   Basic Metabolic Panel: Recent Labs  Lab 07/06/19 1713  NA 139  K 3.9  CL 103  CO2 25  GLUCOSE 107*  BUN 27*  CREATININE 1.05*  CALCIUM 9.3  MG 2.0   GFR: Estimated Creatinine Clearance: 27.3 mL/min (A) (by C-G formula based on SCr of 1.05 mg/dL (H)). Liver Function Tests: Recent Labs  Lab 07/06/19 1713  AST 20  ALT 16  ALKPHOS 62  BILITOT 0.5  PROT 7.5  ALBUMIN 4.0   No results for input(s): LIPASE, AMYLASE in the last 168 hours. No results for input(s): AMMONIA in the last 168 hours. Coagulation Profile: Recent Labs  Lab 07/06/19 1713  INR 1.1   Cardiac Enzymes: No results for input(s): CKTOTAL, CKMB, CKMBINDEX, TROPONINI in the last 168 hours. BNP (last 3 results) No results for input(s): PROBNP in the last 8760 hours. HbA1C: No results for input(s): HGBA1C in the last 72 hours. CBG: No results for input(s): GLUCAP in the last 168 hours. Lipid Profile: No results for input(s): CHOL, HDL, LDLCALC, TRIG, CHOLHDL, LDLDIRECT in the last 72 hours. Thyroid Function Tests: No results for input(s): TSH, T4TOTAL, FREET4, T3FREE, THYROIDAB in the last 72 hours. Anemia Panel: No results for input(s): VITAMINB12, FOLATE, FERRITIN, TIBC, IRON, RETICCTPCT in the last 72 hours. Sepsis Labs: No results for input(s): PROCALCITON, LATICACIDVEN in the last 168 hours.  Recent Results (from the past 240 hour(s))  SARS Coronavirus 2 by RT PCR (hospital order, performed in [facility] hospital lab) Nasopharyngeal Nasopharyngeal Swab     Status: None   Collection Time: 07/06/19  5:04 PM   Specimen: Nasopharyngeal Swab  Result Value Ref Range Status   SARS Coronavirus 2 NEGATIVE NEGATIVE Final    Comment: (NOTE) SARS-CoV-2 target nucleic acids are NOT DETECTED.  The SARS-CoV-2 RNA is generally detectable in upper and lower respiratory specimens during  the acute phase of infection. The lowest concentration of SARS-CoV-2 viral copies this assay can detect is 250 copies / mL. A negative result does not preclude SARS-CoV-2 infection and should not be used as the sole basis for treatment or other patient management decisions.  A negative result may occur with improper specimen collection / handling, submission of specimen other than nasopharyngeal swab, presence of viral mutation(s) within the areas targeted by this assay, and inadequate number of viral copies (<250 copies / mL). A negative result must be combined with clinical observations, patient history, and epidemiological information.  Fact Sheet for Patients:   StrictlyIdeas.no  Fact Sheet for Healthcare Providers: BankingDealers.co.za  This test is not yet approved or  cleared by the [place]  States FDA and has been authorized for detection and/or diagnosis of SARS-CoV-2 by FDA under an Emergency Use Authorization (EUA).  This EUA will remain in effect (meaning this test can be used) for the duration of the COVID-19 declaration under Section 564(b)(1) of the Act, 21 U.S.C. section 360bbb-3(b)(1), unless the authorization is terminated or revoked sooner.  Performed at [facility], [address] [place], [place] [address]   MRSA PCR Screening     Status: None   Collection Time: 07/07/19  5:25 AM   Specimen: Nasal Mucosa; Nasopharyngeal  Result Value Ref Range Status   MRSA by PCR NEGATIVE NEGATIVE Final    Comment:        The GeneXpert MRSA Assay (FDA approved for NASAL specimens only), is one component of a comprehensive MRSA colonization surveillance program. It is not intended to diagnose MRSA infection nor to guide or monitor treatment for MRSA infections. Performed at [facility], [address] [place], [place] [address]          Radiology Studies: No results found.      Scheduled Meds: . aspirin EC  81  mg Oral Daily  . atorvastatin  40 mg Oral Daily  . clonazePAM  0.5 mg Oral BID  . clopidogrel  75 mg Oral Daily  . enoxaparin (LOVENOX) injection  30 mg Subcutaneous Q24H  . irbesartan  37.5 mg Oral Daily  . mirabegron ER  25 mg Oral QHS  . sertraline  100 mg Oral Daily   Continuous Infusions: . lactated ringers 75 mL/hr at 07/11/19 0428     LOS: 2 days    Time spent: 38mins    Kathie Dike, MD [facility]   If 7PM-7AM, please contact night-coverage [email or web address]  07/11/2019, 5:01 PM

## 2019-07-12 ENCOUNTER — Inpatient Hospital Stay (HOSPITAL_COMMUNITY): Payer: Medicare Other

## 2019-07-12 LAB — URINALYSIS, ROUTINE W REFLEX MICROSCOPIC
Bilirubin Urine: NEGATIVE
Glucose, UA: NEGATIVE mg/dL
Hgb urine dipstick: NEGATIVE
Ketones, ur: NEGATIVE mg/dL
Leukocytes,Ua: NEGATIVE
Nitrite: NEGATIVE
Protein, ur: 100 mg/dL — AB
Specific Gravity, Urine: 1.019 (ref 1.005–1.030)
pH: 5 (ref 5.0–8.0)

## 2019-07-12 LAB — CBC
HCT: 32.6 % — ABNORMAL LOW (ref 36.0–46.0)
Hemoglobin: 10.5 g/dL — ABNORMAL LOW (ref 12.0–15.0)
MCH: 31.6 pg (ref 26.0–34.0)
MCHC: 32.2 g/dL (ref 30.0–36.0)
MCV: 98.2 fL (ref 80.0–100.0)
Platelets: 257 10*3/uL (ref 150–400)
RBC: 3.32 MIL/uL — ABNORMAL LOW (ref 3.87–5.11)
RDW: 13 % (ref 11.5–15.5)
WBC: 14.2 10*3/uL — ABNORMAL HIGH (ref 4.0–10.5)
nRBC: 0 % (ref 0.0–0.2)

## 2019-07-12 LAB — BASIC METABOLIC PANEL
Anion gap: 13 (ref 5–15)
BUN: 26 mg/dL — ABNORMAL HIGH (ref 8–23)
CO2: 23 mmol/L (ref 22–32)
Calcium: 8.8 mg/dL — ABNORMAL LOW (ref 8.9–10.3)
Chloride: 100 mmol/L (ref 98–111)
Creatinine, Ser: 0.78 mg/dL (ref 0.44–1.00)
GFR calc Af Amer: >60 mL/min (ref >60)
GFR calc non Af Amer: >60 mL/min (ref >60)
Glucose, Bld: 123 mg/dL — ABNORMAL HIGH (ref 70–99)
Potassium: 3.6 mmol/L (ref 3.5–5.1)
Sodium: 136 mmol/L (ref 135–145)

## 2019-07-12 LAB — SARS CORONAVIRUS 2 BY RT PCR (HOSPITAL ORDER, PERFORMED IN ~~LOC~~ HOSPITAL LAB): SARS Coronavirus 2: NEGATIVE

## 2019-07-12 MED ORDER — ATORVASTATIN CALCIUM 40 MG PO TABS: 40.0000 mg | ORAL_TABLET | Freq: Every day | ORAL | Status: DC

## 2019-07-12 MED ORDER — CLOPIDOGREL BISULFATE 75 MG PO TABS: 75.0000 mg | ORAL_TABLET | Freq: Every day | ORAL | Status: DC

## 2019-07-12 MED ORDER — CLONAZEPAM 0.5 MG PO TABS: 0.5000 mg | ORAL_TABLET | Freq: Two times a day (BID) | ORAL | 0 refills | Status: DC | PRN

## 2019-07-12 MED ORDER — ASPIRIN 81 MG PO TBEC: 81.0000 mg | DELAYED_RELEASE_TABLET | Freq: Every day | ORAL | 11 refills | Status: DC

## 2019-07-12 NOTE — Care Management Important Message (Signed)
Important Message  Patient Details  Name: Deborah Farrell MRN: [id number] Date of Birth: 06-03-1917   Medicare Important Message Given:  Yes     Tommy Medal 07/12/2019, 12:50 PM

## 2019-07-12 NOTE — Progress Notes (Signed)
PROGRESS NOTE    Deborah Farrell  HER:[id number] DOB: 1917/04/09 DOA: 07/06/2019 PCP: Celene Squibb, MD    Brief Narrative:  HPI: Deborah Farrell is a 86 y.o. female with medical history significant for dementia, hypertension, IBS.  Patient was brought to the ED via EMS with reports of altered mental status, patient unable to walk and talk as per her baseline.  Also reports of left facial droop and left-sided weakness.  Last known normal was more than 30 hours ago. At the time of my evaluation, patient is awake alert, able to tell me her name, answer a few simple questions, but she does not know why she is in the hospital.  Per nursing home staff, patient seemed weak and not ambulatory, patient had seem like this since Sunday night.  Patient was evaluated by the supervising doctor today and referred to the ED. Patient tells me she intermittently has pain with urination.  At baseline, patient is incontinent, and ambulatory with a walker.   Assessment & Plan:   Principal Problem:   AMS (altered mental status) Active Problems:   Essential hypertension   Irritable bowel syndrome   Dementia (HCC)   TIA (transient ischemic attack)   Carotid stenosis, bilateral   1. Transient left facial droop/left-sided weakness.  Possibly secondary to TIA.  Symptoms resolved by the time she was admitted to the hospital.  She is not had any recurrence.  MRI brain negative.  She is noted to have bilateral carotid artery stenosis.  At her advanced age and comorbidities, would not anticipate that she would be a candidate for aggressive vascular intervention.  Will discuss with family regarding further work-up.  CT angiogram would be next step in work-up, although she is noted to have allergy to IV dye.  Discussed with patient's niece who agrees that further invasive work-up would not be beneficial.  Reviewed case with neurology, Dr. Merlene Laughter who recommended 3 months of dual antiplatelet therapy followed by single  agent.  Also recommended high intensity statin in light of recent TIA. 2. Dementia.  She is awake and oriented.  Able to answer simple questions.  She resides at a nursing home. 3. Hypertension.  Continue on olmesartan. 4. Acute encephalopathy.  She is noted to be more somnolent today.  She is on scheduled clonazepam which will be changed to as needed.  Repeat labs show leukocytosis.  Check urinalysis and portable chest x-ray. 5. Disposition.  Patient is a resident of an assisted living facility.  Seen by physical therapy and due to level of assistance that is needed, they are recommending skilled nursing facility.  TOC assisting with discharge planning.   DVT prophylaxis: enoxaparin (LOVENOX) injection 30 mg Start: 07/07/19 1000  Code Status: DNR Family Communication: discussed with niece 6/25 Disposition Plan: Status is: Inpatient  The patient will require care spanning > 2 midnights and should be moved to inpatient because: Unsafe d/c plan.  Patient is stable for discharge to skilled nursing facility once a bed is available  Dispo: The patient is from: ALF              Anticipated d/c is to: SNF              Anticipated d/c date is: 1 day              Patient currently is not medically stable to d/c.   Consultants:     Procedures:     Antimicrobials:  Subjective: Somnolent, briefly opens eyes to voice but falls back asleep.  Does not answer questions or follow commands.  Objective: Vitals:   07/11/19 2103 07/12/19 0507 07/12/19 1440 07/12/19 2006  BP: 131/84 (!) 157/69 (!) 144/60   Pulse: 77 90 81   Resp: 17 17 17    Temp: 99.5 F (37.5 C) 99.4 F (37.4 C) 98.8 F (37.1 C)   TempSrc: Oral Oral Oral   SpO2: 99% 98% 98% 98%  Weight:      Height:        Intake/Output Summary (Last 24 hours) at 07/12/2019 2020 Last data filed at 07/12/2019 2263 Gross per 24 hour  Intake 0 ml  Output 200 ml  Net -200 ml   Filed Weights   07/06/19 2315  Weight: 64.6 kg     Examination:  General exam: Very somnolent, does not answer questions Respiratory system: Clear to auscultation. Respiratory effort normal. Cardiovascular system:RRR. No murmurs, rubs, gallops. Gastrointestinal system: Abdomen is nondistended, soft and nontender. No organomegaly or masses felt. Normal bowel sounds heard.   Data Reviewed: I have personally reviewed following labs and imaging studies  CBC: Recent Labs  Lab 07/06/19 1713 07/12/19 1250  WBC 10.4 14.2*  NEUTROABS 7.4  --   HGB 10.4* 10.5*  HCT 32.5* 32.6*  MCV 98.5 98.2  PLT 196 335   Basic Metabolic Panel: Recent Labs  Lab 07/06/19 1713 07/12/19 1250  NA 139 136  K 3.9 3.6  CL 103 100  CO2 25 23  GLUCOSE 107* 123*  BUN 27* 26*  CREATININE 1.05* 0.78  CALCIUM 9.3 8.8*  MG 2.0  --    GFR: Estimated Creatinine Clearance: 35.9 mL/min (by C-G formula based on SCr of 0.78 mg/dL). Liver Function Tests: Recent Labs  Lab 07/06/19 1713  AST 20  ALT 16  ALKPHOS 62  BILITOT 0.5  PROT 7.5  ALBUMIN 4.0   No results for input(s): LIPASE, AMYLASE in the last 168 hours. No results for input(s): AMMONIA in the last 168 hours. Coagulation Profile: Recent Labs  Lab 07/06/19 1713  INR 1.1   Cardiac Enzymes: No results for input(s): CKTOTAL, CKMB, CKMBINDEX, TROPONINI in the last 168 hours. BNP (last 3 results) No results for input(s): PROBNP in the last 8760 hours. HbA1C: No results for input(s): HGBA1C in the last 72 hours. CBG: No results for input(s): GLUCAP in the last 168 hours. Lipid Profile: No results for input(s): CHOL, HDL, LDLCALC, TRIG, CHOLHDL, LDLDIRECT in the last 72 hours. Thyroid Function Tests: No results for input(s): TSH, T4TOTAL, FREET4, T3FREE, THYROIDAB in the last 72 hours. Anemia Panel: No results for input(s): VITAMINB12, FOLATE, FERRITIN, TIBC, IRON, RETICCTPCT in the last 72 hours. Sepsis Labs: No results for input(s): PROCALCITON, LATICACIDVEN in the last 168  hours.  Recent Results (from the past 240 hour(s))  SARS Coronavirus 2 by RT PCR (hospital order, performed in [facility] hospital lab) Nasopharyngeal Nasopharyngeal Swab     Status: None   Collection Time: 07/06/19  5:04 PM   Specimen: Nasopharyngeal Swab  Result Value Ref Range Status   SARS Coronavirus 2 NEGATIVE NEGATIVE Final    Comment: (NOTE) SARS-CoV-2 target nucleic acids are NOT DETECTED.  The SARS-CoV-2 RNA is generally detectable in upper and lower respiratory specimens during the acute phase of infection. The lowest concentration of SARS-CoV-2 viral copies this assay can detect is 250 copies / mL. A negative result does not preclude SARS-CoV-2 infection and should not be used as the sole basis  for treatment or other patient management decisions.  A negative result may occur with improper specimen collection / handling, submission of specimen other than nasopharyngeal swab, presence of viral mutation(s) within the areas targeted by this assay, and inadequate number of viral copies (<250 copies / mL). A negative result must be combined with clinical observations, patient history, and epidemiological information.  Fact Sheet for Patients:   StrictlyIdeas.no  Fact Sheet for Healthcare Providers: BankingDealers.co.za  This test is not yet approved or  cleared by the [place] FDA and has been authorized for detection and/or diagnosis of SARS-CoV-2 by FDA under an Emergency Use Authorization (EUA).  This EUA will remain in effect (meaning this test can be used) for the duration of the COVID-19 declaration under Section 564(b)(1) of the Act, 21 U.S.C. section 360bbb-3(b)(1), unless the authorization is terminated or revoked sooner.  Performed at [facility], [address] [place], [place] [address]   MRSA PCR Screening     Status: None   Collection Time: 07/07/19  5:25 AM   Specimen: Nasal Mucosa; Nasopharyngeal   Result Value Ref Range Status   MRSA by PCR NEGATIVE NEGATIVE Final    Comment:        The GeneXpert MRSA Assay (FDA approved for NASAL specimens only), is one component of a comprehensive MRSA colonization surveillance program. It is not intended to diagnose MRSA infection nor to guide or monitor treatment for MRSA infections. Performed at [facility], [address] [place], [place] [address]   SARS Coronavirus 2 by RT PCR (hospital order, performed in [facility] hospital lab) Nasopharyngeal Nasopharyngeal Swab     Status: None   Collection Time: 07/12/19 11:56 AM   Specimen: Nasopharyngeal Swab  Result Value Ref Range Status   SARS Coronavirus 2 NEGATIVE NEGATIVE Final    Comment: (NOTE) SARS-CoV-2 target nucleic acids are NOT DETECTED.  The SARS-CoV-2 RNA is generally detectable in upper and lower respiratory specimens during the acute phase of infection. The lowest concentration of SARS-CoV-2 viral copies this assay can detect is 250 copies / mL. A negative result does not preclude SARS-CoV-2 infection and should not be used as the sole basis for treatment or other patient management decisions.  A negative result may occur with improper specimen collection / handling, submission of specimen other than nasopharyngeal swab, presence of viral mutation(s) within the areas targeted by this assay, and inadequate number of viral copies (<250 copies / mL). A negative result must be combined with clinical observations, patient history, and epidemiological information.  Fact Sheet for Patients:   StrictlyIdeas.no  Fact Sheet for Healthcare Providers: BankingDealers.co.za  This test is not yet approved or  cleared by the [place] FDA and has been authorized for detection and/or diagnosis of SARS-CoV-2 by FDA under an Emergency Use Authorization (EUA).  This EUA will remain in effect (meaning this test can be used) for the  duration of the COVID-19 declaration under Section 564(b)(1) of the Act, 21 U.S.C. section 360bbb-3(b)(1), unless the authorization is terminated or revoked sooner.  Performed at [facility], [address], [place], [place] [address]          Radiology Studies: No results found.      Scheduled Meds: . aspirin EC  81 mg Oral Daily  . atorvastatin  40 mg Oral Daily  . clonazePAM  0.5 mg Oral BID  . clopidogrel  75 mg Oral Daily  . enoxaparin (LOVENOX) injection  30 mg Subcutaneous Q24H  . irbesartan  37.5 mg Oral Daily  . mirabegron ER  25 mg Oral QHS  . sertraline  100 mg Oral Daily   Continuous Infusions: . lactated ringers 75 mL/hr at 07/12/19 0814     LOS: 3 days    Time spent: 39mins    Kathie Dike, MD [facility]   If 7PM-7AM, please contact night-coverage [email or web address]  07/12/2019, 8:20 PM

## 2019-07-13 LAB — CBC
HCT: 33.1 % — ABNORMAL LOW (ref 36.0–46.0)
Hemoglobin: 10.7 g/dL — ABNORMAL LOW (ref 12.0–15.0)
MCH: 31.8 pg (ref 26.0–34.0)
MCHC: 32.3 g/dL (ref 30.0–36.0)
MCV: 98.5 fL (ref 80.0–100.0)
Platelets: 214 10*3/uL (ref 150–400)
RBC: 3.36 MIL/uL — ABNORMAL LOW (ref 3.87–5.11)
RDW: 12.9 % (ref 11.5–15.5)
WBC: 14.6 10*3/uL — ABNORMAL HIGH (ref 4.0–10.5)
nRBC: 0 % (ref 0.0–0.2)

## 2019-07-13 MED ORDER — SODIUM CHLORIDE 0.9 % IV SOLN
1.0000 g | INTRAVENOUS | Status: DC
  Administered 2019-07-13 – 2019-07-16 (×4): 1 g via INTRAVENOUS
  Filled 2019-07-13 (×4): qty 10

## 2019-07-13 NOTE — Progress Notes (Addendum)
Patient offered breakfast twice and refused both times. Patient able to take medications and sip of water. Patient cleaned up from BM. Patient left in upright position with bed alarm on.

## 2019-07-13 NOTE — Progress Notes (Signed)
PROGRESS NOTE    Deborah Farrell  PIR:[id number] DOB: 10-30-1917 DOA: 07/06/2019 PCP: Celene Squibb, MD    Brief Narrative:  HPI: Deborah Farrell is a 86 y.o. female with medical history significant for dementia, hypertension, IBS.  Patient was brought to the ED via EMS with reports of altered mental status, patient unable to walk and talk as per her baseline.  Also reports of left facial droop and left-sided weakness.  Last known normal was more than 30 hours ago. At the time of my evaluation, patient is awake alert, able to tell me her name, answer a few simple questions, but she does not know why she is in the hospital.  Per nursing home staff, patient seemed weak and not ambulatory, patient had seem like this since Sunday night.  Patient was evaluated by the supervising doctor today and referred to the ED. Patient tells me she intermittently has pain with urination.  At baseline, patient is incontinent, and ambulatory with a walker.   Assessment & Plan:   Principal Problem:   AMS (altered mental status) Active Problems:   Essential hypertension   Irritable bowel syndrome   Dementia (HCC)   TIA (transient ischemic attack)   Carotid stenosis, bilateral   1. Transient left facial droop/left-sided weakness.  Possibly secondary to TIA.  Symptoms resolved by the time she was admitted to the hospital.  She is not had any recurrence.  MRI brain negative.  She is noted to have bilateral carotid artery stenosis.  At her advanced age and comorbidities, would not anticipate that she would be a candidate for aggressive vascular intervention.  Will discuss with family regarding further work-up.  CT angiogram would be next step in work-up, although she is noted to have allergy to IV dye.  Discussed with patient's niece who agrees that further invasive work-up would not be beneficial.  Reviewed case with neurology, Dr. Merlene Laughter who recommended 3 months of dual antiplatelet therapy followed by single  agent.  Also recommended high intensity statin in light of recent TIA. 2. Dementia.  She is awake and oriented.  Able to answer simple questions.  She resides at a nursing home. 3. Hypertension.  Continue on olmesartan. 4. Acute encephalopathy.  She is noted to be more somnolent today.  She was on scheduled clonazepam which was then changed to as needed.  Repeat labs show leukocytosis and she has been having low-grade fevers.. Chest x-ray does not show any obvious pneumonia. Check blood cultures. Check urine culture. Will start empiric Rocephin with low threshold to discontinue if cultures are negative. 5. Disposition.  Patient is a resident of an assisted living facility.  Seen by physical therapy and due to level of assistance that is needed, they are recommending skilled nursing facility.  TOC assisting with discharge planning.   DVT prophylaxis: enoxaparin (LOVENOX) injection 30 mg Start: 07/07/19 1000  Code Status: DNR Family Communication: discussed with niece 6/25 Disposition Plan: Status is: Inpatient  The patient will require care spanning > 2 midnights and should be moved to inpatient because: Unsafe d/c plan.  Patient is stable for discharge to skilled nursing facility once a bed is available  Dispo: The patient is from: ALF              Anticipated d/c is to: SNF              Anticipated d/c date is: 1 day  Patient currently is not medically stable to d/c.   Consultants:     Procedures:     Antimicrobials:       Subjective: Continues to be somnolent. Does not answer questions or follow commands.  Objective: Vitals:   07/12/19 2127 07/13/19 0533 07/13/19 1425 07/13/19 2004  BP: (!) 151/64 (!) 160/66 (!) 148/86   Pulse: 81 89 85   Resp:  20    Temp: 98.7 F (37.1 C) 100 F (37.8 C) (!) 100.9 F (38.3 C)   TempSrc: Oral Oral Oral   SpO2: 97% 99% 99% 99%  Weight:      Height:        Intake/Output Summary (Last 24 hours) at 07/13/2019 2115 Last  data filed at 07/13/2019 1700 Gross per 24 hour  Intake 240 ml  Output 200 ml  Net 40 ml   Filed Weights   07/06/19 2315  Weight: 64.6 kg    Examination:  General exam: Somnolent, briefly opens her eyes but does not answer questions, falls back asleep. Respiratory system: Clear to auscultation. Respiratory effort normal. Cardiovascular system:RRR. No murmurs, rubs, gallops. Gastrointestinal system: Abdomen is nondistended, soft and nontender. No organomegaly or masses felt. Normal bowel sounds heard. Central nervous system: No focal neurological deficits. Extremities: No C/C/E, +pedal pulses Skin: No rashes, lesions or ulcers Psychiatry: Unable to assess due to mental status    Data Reviewed: I have personally reviewed following labs and imaging studies  CBC: Recent Labs  Lab 07/12/19 1250 07/13/19 0613  WBC 14.2* 14.6*  HGB 10.5* 10.7*  HCT 32.6* 33.1*  MCV 98.2 98.5  PLT 257 163   Basic Metabolic Panel: Recent Labs  Lab 07/12/19 1250  NA 136  K 3.6  CL 100  CO2 23  GLUCOSE 123*  BUN 26*  CREATININE 0.78  CALCIUM 8.8*   GFR: Estimated Creatinine Clearance: 35.9 mL/min (by C-G formula based on SCr of 0.78 mg/dL). Liver Function Tests: No results for input(s): AST, ALT, ALKPHOS, BILITOT, PROT, ALBUMIN in the last 168 hours. No results for input(s): LIPASE, AMYLASE in the last 168 hours. No results for input(s): AMMONIA in the last 168 hours. Coagulation Profile: No results for input(s): INR, PROTIME in the last 168 hours. Cardiac Enzymes: No results for input(s): CKTOTAL, CKMB, CKMBINDEX, TROPONINI in the last 168 hours. BNP (last 3 results) No results for input(s): PROBNP in the last 8760 hours. HbA1C: No results for input(s): HGBA1C in the last 72 hours. CBG: No results for input(s): GLUCAP in the last 168 hours. Lipid Profile: No results for input(s): CHOL, HDL, LDLCALC, TRIG, CHOLHDL, LDLDIRECT in the last 72 hours. Thyroid Function Tests: No  results for input(s): TSH, T4TOTAL, FREET4, T3FREE, THYROIDAB in the last 72 hours. Anemia Panel: No results for input(s): VITAMINB12, FOLATE, FERRITIN, TIBC, IRON, RETICCTPCT in the last 72 hours. Sepsis Labs: No results for input(s): PROCALCITON, LATICACIDVEN in the last 168 hours.  Recent Results (from the past 240 hour(s))  SARS Coronavirus 2 by RT PCR (hospital order, performed in [facility] hospital lab) Nasopharyngeal Nasopharyngeal Swab     Status: None   Collection Time: 07/06/19  5:04 PM   Specimen: Nasopharyngeal Swab  Result Value Ref Range Status   SARS Coronavirus 2 NEGATIVE NEGATIVE Final    Comment: (NOTE) SARS-CoV-2 target nucleic acids are NOT DETECTED.  The SARS-CoV-2 RNA is generally detectable in upper and lower respiratory specimens during the acute phase of infection. The lowest concentration of SARS-CoV-2 viral copies this assay can  detect is 250 copies / mL. A negative result does not preclude SARS-CoV-2 infection and should not be used as the sole basis for treatment or other patient management decisions.  A negative result may occur with improper specimen collection / handling, submission of specimen other than nasopharyngeal swab, presence of viral mutation(s) within the areas targeted by this assay, and inadequate number of viral copies (<250 copies / mL). A negative result must be combined with clinical observations, patient history, and epidemiological information.  Fact Sheet for Patients:   StrictlyIdeas.no  Fact Sheet for Healthcare Providers: BankingDealers.co.za  This test is not yet approved or  cleared by the [place] FDA and has been authorized for detection and/or diagnosis of SARS-CoV-2 by FDA under an Emergency Use Authorization (EUA).  This EUA will remain in effect (meaning this test can be used) for the duration of the COVID-19 declaration under Section 564(b)(1) of the Act, 21  U.S.C. section 360bbb-3(b)(1), unless the authorization is terminated or revoked sooner.  Performed at [facility], [address], [place], [place] [address]   MRSA PCR Screening     Status: None   Collection Time: 07/07/19  5:25 AM   Specimen: Nasal Mucosa; Nasopharyngeal  Result Value Ref Range Status   MRSA by PCR NEGATIVE NEGATIVE Final    Comment:        The GeneXpert MRSA Assay (FDA approved for NASAL specimens only), is one component of a comprehensive MRSA colonization surveillance program. It is not intended to diagnose MRSA infection nor to guide or monitor treatment for MRSA infections. Performed at [facility], [address], [place], [place] [address]   SARS Coronavirus 2 by RT PCR (hospital order, performed in [facility] hospital lab) Nasopharyngeal Nasopharyngeal Swab     Status: None   Collection Time: 07/12/19 11:56 AM   Specimen: Nasopharyngeal Swab  Result Value Ref Range Status   SARS Coronavirus 2 NEGATIVE NEGATIVE Final    Comment: (NOTE) SARS-CoV-2 target nucleic acids are NOT DETECTED.  The SARS-CoV-2 RNA is generally detectable in upper and lower respiratory specimens during the acute phase of infection. The lowest concentration of SARS-CoV-2 viral copies this assay can detect is 250 copies / mL. A negative result does not preclude SARS-CoV-2 infection and should not be used as the sole basis for treatment or other patient management decisions.  A negative result may occur with improper specimen collection / handling, submission of specimen other than nasopharyngeal swab, presence of viral mutation(s) within the areas targeted by this assay, and inadequate number of viral copies (<250 copies / mL). A negative result must be combined with clinical observations, patient history, and epidemiological information.  Fact Sheet for Patients:   StrictlyIdeas.no  Fact Sheet for Healthcare  Providers: BankingDealers.co.za  This test is not yet approved or  cleared by the [place] FDA and has been authorized for detection and/or diagnosis of SARS-CoV-2 by FDA under an Emergency Use Authorization (EUA).  This EUA will remain in effect (meaning this test can be used) for the duration of the COVID-19 declaration under Section 564(b)(1) of the Act, 21 U.S.C. section 360bbb-3(b)(1), unless the authorization is terminated or revoked sooner.  Performed at [facility], [address], [place], [place] [address]          Radiology Studies: DG CHEST PORT 1 VIEW  Result Date: 07/12/2019 CLINICAL DATA:  Leukocytosis.  Altered mental status. EXAM: PORTABLE CHEST 1 VIEW COMPARISON:  03/10/2018 FINDINGS: Lung volumes are low.The cardiomediastinal contours are  normal. Aortic atherosclerosis. Patient's chin partially obscures evaluation of the left lung apex, could not tolerate repositioning. Pulmonary vasculature is normal. No consolidation, pleural effusion, or pneumothorax. No acute osseous abnormalities are seen. IMPRESSION: Low lung volumes without acute abnormality. Aortic Atherosclerosis (ICD10-I70.0). Electronically Signed   By: Keith Rake M.D.   On: 07/12/2019 21:19        Scheduled Meds: . aspirin EC  81 mg Oral Daily  . atorvastatin  40 mg Oral Daily  . clonazePAM  0.5 mg Oral BID  . clopidogrel  75 mg Oral Daily  . enoxaparin (LOVENOX) injection  30 mg Subcutaneous Q24H  . irbesartan  37.5 mg Oral Daily  . mirabegron ER  25 mg Oral QHS  . sertraline  100 mg Oral Daily   Continuous Infusions: . cefTRIAXone (ROCEPHIN)  IV    . lactated ringers 75 mL/hr at 07/12/19 2158     LOS: 4 days    Time spent: 58mins    Kathie Dike, MD [facility]   If 7PM-7AM, please contact night-coverage [email or web address]  07/13/2019, 9:15 PM

## 2019-07-13 NOTE — Progress Notes (Signed)
PT Cancellation Note  Patient Details Name: Deborah Farrell MRN: [id number] DOB: 01/22/1917   Cancelled Treatment:    Reason Eval/Treat Not Completed: Medical issues which prohibited therapy;Fatigue/lethargy limiting ability to participate. Per RN, pt's WBC count is up and with elevated temperature; pt difficult to arouse today. Will hold therapy and check back tomorrow.   Talbot Grumbling PT, DPT 07/13/19, 11:32 AM [phone]

## 2019-07-14 ENCOUNTER — Inpatient Hospital Stay (HOSPITAL_COMMUNITY): Payer: Medicare Other

## 2019-07-14 ENCOUNTER — Inpatient Hospital Stay (HOSPITAL_COMMUNITY)
Admit: 2019-07-14 | Discharge: 2019-07-14 | Disposition: A | Discharge status: Home / Self Care | Payer: Medicare Other | Attending: Internal Medicine | Admitting: Internal Medicine

## 2019-07-14 DIAGNOSIS — G459 Transient cerebral ischemic attack, unspecified: Principal | ICD-10-CM

## 2019-07-14 DIAGNOSIS — D72829 Elevated white blood cell count, unspecified: Secondary | ICD-10-CM

## 2019-07-14 DIAGNOSIS — D72823 Leukemoid reaction: Secondary | ICD-10-CM

## 2019-07-14 LAB — BLOOD CULTURE ID PANEL (REFLEXED)

## 2019-07-14 LAB — COMPREHENSIVE METABOLIC PANEL
ALT: 25 U/L (ref 0–44)
AST: 24 U/L (ref 15–41)
Albumin: 3.1 g/dL — ABNORMAL LOW (ref 3.5–5.0)
Alkaline Phosphatase: 58 U/L (ref 38–126)
Anion gap: 12 (ref 5–15)
BUN: 25 mg/dL — ABNORMAL HIGH (ref 8–23)
CO2: 23 mmol/L (ref 22–32)
Calcium: 8.7 mg/dL — ABNORMAL LOW (ref 8.9–10.3)
Chloride: 101 mmol/L (ref 98–111)
Creatinine, Ser: 0.75 mg/dL (ref 0.44–1.00)
GFR calc Af Amer: >60 mL/min (ref >60)
GFR calc non Af Amer: >60 mL/min (ref >60)
Glucose, Bld: 131 mg/dL — ABNORMAL HIGH (ref 70–99)
Potassium: 3.4 mmol/L — ABNORMAL LOW (ref 3.5–5.1)
Sodium: 136 mmol/L (ref 135–145)
Total Bilirubin: 0.7 mg/dL (ref 0.3–1.2)
Total Protein: 6.8 g/dL (ref 6.5–8.1)

## 2019-07-14 LAB — FOLATE: Folate: 39.8 ng/mL (ref >5.9)

## 2019-07-14 LAB — PROCALCITONIN: Procalcitonin: <0.1 ng/mL

## 2019-07-14 LAB — CBC
HCT: 30.9 % — ABNORMAL LOW (ref 36.0–46.0)
Hemoglobin: 10 g/dL — ABNORMAL LOW (ref 12.0–15.0)
MCH: 31.2 pg (ref 26.0–34.0)
MCHC: 32.4 g/dL (ref 30.0–36.0)
MCV: 96.3 fL (ref 80.0–100.0)
Platelets: 224 10*3/uL (ref 150–400)
RBC: 3.21 MIL/uL — ABNORMAL LOW (ref 3.87–5.11)
RDW: 12.6 % (ref 11.5–15.5)
WBC: 15.2 10*3/uL — ABNORMAL HIGH (ref 4.0–10.5)
nRBC: 0 % (ref 0.0–0.2)

## 2019-07-14 LAB — T4, FREE: Free T4: 1.17 ng/dL — ABNORMAL HIGH (ref 0.61–1.12)

## 2019-07-14 LAB — AMMONIA: Ammonia: 28 umol/L (ref 9–35)

## 2019-07-14 LAB — MAGNESIUM: Magnesium: 2.1 mg/dL (ref 1.7–2.4)

## 2019-07-14 LAB — VITAMIN B12: Vitamin B-12: 453 pg/mL (ref 180–914)

## 2019-07-14 MED ORDER — ENOXAPARIN SODIUM 40 MG/0.4ML ~~LOC~~ SOLN
40.0000 mg | SUBCUTANEOUS | Status: DC
  Administered 2019-07-15 – 2019-07-20 (×6): 40 mg via SUBCUTANEOUS
  Filled 2019-07-14 (×6): qty 0.4

## 2019-07-14 MED ORDER — POTASSIUM CHLORIDE IN NACL 20-0.9 MEQ/L-% IV SOLN: INTRAVENOUS | Status: AC

## 2019-07-14 NOTE — TOC Progression Note (Signed)
Transition of Care [facility] - Progression Note    Patient Details  Name: Deborah Farrell MRN: [id number] Date of Birth: [birth year]  Transition of Care [facility]) CM/SW Contact  Salome Arnt, [place] Phone Number: 07/14/2019, 3:37 PM  Clinical Narrative:   LCSW restarted SNF authorization (ref # [id number]). Updated [facility]. Pt will require repeat COVID test at d/c.  Will need to fax updated PT notes to [facility] when available.       Barriers to Discharge: Barriers Resolved  Expected Discharge Plan and Services           Expected Discharge Date: 07/12/19                                     Social Determinants of Health (SDOH) Interventions    Readmission Risk Interventions No flowsheet data found.

## 2019-07-14 NOTE — Procedures (Signed)
ELECTROENCEPHALOGRAM REPORT   Patient: Deborah Farrell       Room #: A71  Age: 86 y.o.        Sex: female Requesting Physician: Tat Report Date:  07/14/2019        Interpreting Physician: Alexis Goodell  History: [name] is an 86 y.o. female with altered mental status  Medications:  ASA, Lipitor, Rocephin, Plavix, Avapro, Myrbetriq, Zoloft  Conditions of Recording:  This is a 21 channel routine scalp EEG performed with bipolar and monopolar montages arranged in accordance to the international 10/20 system of electrode placement. One channel was dedicated to EKG recording.  The patient is in the drowsy and asleep states.  Description:  The patient appears asleep for much of the recording.  The background is slow and poorly organized with superimposed symmetrical sleep spindles and vertex central sharp transients noted.  Towards the end of the recording the patient does open her eyes and although she does not respond the background does activate with a mixture of theta and alpha activity noted.   No epileptiform activity is noted.  Hyperventilation and intermittent photic stimulation were not performed.  IMPRESSION: This electroencephalogram is consistent with normal drowse and sleep.  No epileptiform activity is noted.     Alexis Goodell, MD Neurology [phone] 07/14/2019, 3:44 PM

## 2019-07-14 NOTE — Progress Notes (Signed)
EEG complete - results pending 

## 2019-07-14 NOTE — Progress Notes (Signed)
CRITICAL VALUE ALERT  Critical Value:  Both bottles anerobic gram positive cocci  Date & Time Notied: 07/14/2019 1342  Provider Notified: Tat  Orders Received/Actions taken: No orders given at this time

## 2019-07-14 NOTE — Care Management Important Message (Signed)
Important Message  Patient Details  Name: Deborah Farrell MRN: [id number] Date of Birth: March 04, 1917   Medicare Important Message Given:  Yes     Tommy Medal 07/14/2019, 3:06 PM

## 2019-07-14 NOTE — Progress Notes (Signed)
CRITICAL VALUE ALERT  Critical Value:  2 out of 4 bottles gram positive cocci staph meca detected  Date & Time Notied:  07/14/2019 1906  Provider Notified: M.Sharlet Salina  Orders Received/Actions taken: no orders given at this time

## 2019-07-14 NOTE — Progress Notes (Signed)
PROGRESS NOTE  Deborah Farrell OIB:[id number] DOB: Jun 24, 1917 DOA: 07/06/2019 PCP: Celene Squibb, MD  Brief History:  86 year old female with a history of dementia, hypertension, IBS presenting with lethargy from [place].  According to the patient's niece, the patient is able to ambulate with a walker and is able to speak and carry on a conversation albeit she is confused at baseline.  She is normally able to feed herself but does require assistance with her other activities of daily living.  In addition, there was new onset left facial droop and left-sided weakness.  Since admission, MRI of the brain was obtained which showed no acute findings.  However neurology was consulted, and there was concern for TIA.  The patient was started on aspirin and Plavix.  Patient continued to be somnolent requiring assistance with feeding.  PT evaluated the patient and recommended skilled nursing facility.  She remained encephalopathic.  As result, oral intake remained poor.  Assessment/Plan: TIA/facial droop/left-sided weakness -Secondary to TIA -MRI brain negative -Patient seen by neurology--> recommended aspirin Plavix x3 months followed by Plavix monotherapy daily -Carotid ultrasound showed bilateral carotid occlusion, right-sided 70% and 50% left side. -Niece did not want any aggressive intervention  Acute toxic/metabolic encephalopathy -Discontinue clonazepam -Continue empiric ceftriaxone -Personally reviewed chest x-ray--bibasilar opacities -Check ammonia -Check serum X45 -Check folic acid -TSH 0.388 -07/12/2019 UA--no pyuria -EEG  Leukocytosis -WBC gradually climbing -CT chest -follow blood and urine culture  Essential hypertension -Continue ARB  Hyperlipidemia -Continue statin  Anxiety/depression -Holding clonazepam -Pain continue sertraline  Hypokalemia -replete -check mag  Goals of Care -Advance care planning, including the explanation and discussion of advance  directives was carried out with the patient and family.  Code status including explanations of "Full Code" and "DNR" and alternatives were discussed in detail.  Discussion of end-of-life issues including but not limited palliative care, hospice care and the concept of hospice, other end-of-life care options, power of attorney for health care decisions, living wills, and physician orders for life-sustaining treatment were also discussed with the patient and family.  Total face to face time 16 minutes. -confirmed DNR with niece     Status is: Inpatient  Remains inpatient appropriate because:Altered mental status   Dispo: The patient is from: ALF              Anticipated d/c is to: SNF              Anticipated d/c date is: 2 days              Patient currently is not medically stable to d/c.        Family Communication:   Niece updated 6/30  Consultants:  neuro  Code Status:  DNR  DVT Prophylaxis:   [place] Lovenox   Procedures: As Listed in Progress Note Above  Antibiotics: None     Subjective: Patient intermittently opens eyes.  She only intermittently answers yes/no.  No reports of vomiting, diarrhea, respiratory distress, uncontrolled pain.  Remainder review of systems unobtainable secondary to patient's encephalopathy  Objective: Vitals:   07/13/19 1425 07/13/19 2004 07/13/19 2125 07/14/19 0440  BP: (!) 148/86  (!) 171/76 (!) 161/77  Pulse: 85  86 95  Resp:   (!) 24 (!) 24  Temp: (!) 100.9 F (38.3 C)  98.5 F (36.9 C) 99.5 F (37.5 C)  TempSrc: Oral  Oral Oral  SpO2: 99% 99% 98% 95%  Weight:  Height:        Intake/Output Summary (Last 24 hours) at 07/14/2019 1355 Last data filed at 07/14/2019 0400 Gross per 24 hour  Intake 4559.98 ml  Output --  Net 4559.98 ml   Weight change:  Exam:   General:  Pt is somnolent, does not follow commands appropriately, not in acute distress  HEENT: No icterus, No thrush, No neck mass, [place]/AT  Cardiovascular:  RRR, S1/S2, no rubs, no gallops  Respiratory: Bibasilar rales. No wheeze  Abdomen: Soft/+BS, non tender, non distended, no guarding  Extremities: No edema, No lymphangitis, No petechiae, No rashes, no synovitis   Data Reviewed: I have personally reviewed following labs and imaging studies Basic Metabolic Panel: Recent Labs  Lab 07/12/19 1250 07/14/19 0616  NA 136 136  K 3.6 3.4*  CL 100 101  CO2 23 23  GLUCOSE 123* 131*  BUN 26* 25*  CREATININE 0.78 0.75  CALCIUM 8.8* 8.7*   Liver Function Tests: Recent Labs  Lab 07/14/19 0616  AST 24  ALT 25  ALKPHOS 58  BILITOT 0.7  PROT 6.8  ALBUMIN 3.1*   No results for input(s): LIPASE, AMYLASE in the last 168 hours. No results for input(s): AMMONIA in the last 168 hours. Coagulation Profile: No results for input(s): INR, PROTIME in the last 168 hours. CBC: Recent Labs  Lab 07/12/19 1250 07/13/19 0613 07/14/19 0616  WBC 14.2* 14.6* 15.2*  HGB 10.5* 10.7* 10.0*  HCT 32.6* 33.1* 30.9*  MCV 98.2 98.5 96.3  PLT 257 214 224   Cardiac Enzymes: No results for input(s): CKTOTAL, CKMB, CKMBINDEX, TROPONINI in the last 168 hours. BNP: Invalid input(s): POCBNP CBG: No results for input(s): GLUCAP in the last 168 hours. HbA1C: No results for input(s): HGBA1C in the last 72 hours. Urine analysis:    Component Value Date/Time   COLORURINE YELLOW 07/12/2019 1609   APPEARANCEUR CLOUDY (A) 07/12/2019 1609   LABSPEC 1.019 07/12/2019 1609   PHURINE 5.0 07/12/2019 1609   GLUCOSEU NEGATIVE 07/12/2019 1609   HGBUR NEGATIVE 07/12/2019 1609   BILIRUBINUR NEGATIVE 07/12/2019 1609   KETONESUR NEGATIVE 07/12/2019 1609   PROTEINUR 100 (A) 07/12/2019 1609   UROBILINOGEN 1.0 11/02/2011 2208   NITRITE NEGATIVE 07/12/2019 1609   LEUKOCYTESUR NEGATIVE 07/12/2019 1609   Sepsis Labs: @LABRCNTIP (procalcitonin:4,lacticidven:4) ) Recent Results (from the past 240 hour(s))  SARS Coronavirus 2 by RT PCR (hospital order, performed in [place] hospital lab) Nasopharyngeal Nasopharyngeal Swab     Status: None   Collection Time: 07/06/19  5:04 PM   Specimen: Nasopharyngeal Swab  Result Value Ref Range Status   SARS Coronavirus 2 NEGATIVE NEGATIVE Final    Comment: (NOTE) SARS-CoV-2 target nucleic acids are NOT DETECTED.  The SARS-CoV-2 RNA is generally detectable in upper and lower respiratory specimens during the acute phase of infection. The lowest concentration of SARS-CoV-2 viral copies this assay can detect is 250 copies / mL. A negative result does not preclude SARS-CoV-2 infection and should not be used as the sole basis for treatment or other patient management decisions.  A negative result may occur with improper specimen collection / handling, submission of specimen other than nasopharyngeal swab, presence of viral mutation(s) within the areas targeted by this assay, and inadequate number of viral copies (<250 copies / mL). A negative result must be combined with clinical observations, patient history, and epidemiological information.  Fact Sheet for Patients:   StrictlyIdeas.no  Fact Sheet for Healthcare Providers: BankingDealers.co.za  This test is not yet approved or  cleared by the [place] and has been authorized for detection and/or diagnosis of SARS-CoV-2 by FDA under an Emergency Use Authorization (EUA).  This EUA will remain in effect (meaning this test can be used) for the duration of the COVID-19 declaration under Section 564(b)(1) of the Act, 21 U.S.C. section 360bbb-3(b)(1), unless the authorization is terminated or revoked sooner.  Performed at [facility], [address], [place], [place] [address]   MRSA PCR Screening     Status: None   Collection Time: 07/07/19  5:25 AM   Specimen: Nasal Mucosa; Nasopharyngeal  Result Value Ref Range Status   MRSA by PCR NEGATIVE NEGATIVE Final    Comment:        The GeneXpert MRSA Assay  (FDA approved for NASAL specimens only), is one component of a comprehensive MRSA colonization surveillance program. It is not intended to diagnose MRSA infection nor to guide or monitor treatment for MRSA infections. Performed at [facility], [address] [place], [place] [address]   SARS Coronavirus 2 by RT PCR (hospital order, performed in [facility] hospital lab) Nasopharyngeal Nasopharyngeal Swab     Status: None   Collection Time: 07/12/19 11:56 AM   Specimen: Nasopharyngeal Swab  Result Value Ref Range Status   SARS Coronavirus 2 NEGATIVE NEGATIVE Final    Comment: (NOTE) SARS-CoV-2 target nucleic acids are NOT DETECTED.  The SARS-CoV-2 RNA is generally detectable in upper and lower respiratory specimens during the acute phase of infection. The lowest concentration of SARS-CoV-2 viral copies this assay can detect is 250 copies / mL. A negative result does not preclude SARS-CoV-2 infection and should not be used as the sole basis for treatment or other patient management decisions.  A negative result may occur with improper specimen collection / handling, submission of specimen other than nasopharyngeal swab, presence of viral mutation(s) within the areas targeted by this assay, and inadequate number of viral copies (<250 copies / mL). A negative result must be combined with clinical observations, patient history, and epidemiological information.  Fact Sheet for Patients:   StrictlyIdeas.no  Fact Sheet for Healthcare Providers: BankingDealers.co.za  This test is not yet approved or  cleared by the [place] FDA and has been authorized for detection and/or diagnosis of SARS-CoV-2 by FDA under an Emergency Use Authorization (EUA).  This EUA will remain in effect (meaning this test can be used) for the duration of the COVID-19 declaration under Section 564(b)(1) of the Act, 21 U.S.C. section 360bbb-3(b)(1), unless the  authorization is terminated or revoked sooner.  Performed at [facility], [address] [place], [place] [address]   Culture, blood (routine x 2)     Status: None (Preliminary result)   Collection Time: 07/13/19  9:48 PM   Specimen: BLOOD RIGHT HAND  Result Value Ref Range Status   Specimen Description BLOOD RIGHT HAND  Final   Special Requests   Final    BOTTLES DRAWN AEROBIC AND ANAEROBIC Blood Culture adequate volume   Culture  Setup Time   Final    GRAM POSITIVE COCCI  ANAEROBIC BOTTLE Gram Stain Report Called to,Read Back By and Verified With:  TAYLOR,M 1339 07/14/2019 BY Kaylyn Layer PENN Performed at [facility], [address], [place], [place] [address]    Culture PENDING  Incomplete   Report Status PENDING  Incomplete  Culture, blood (routine x 2)     Status: None (Preliminary result)   Collection Time: 07/13/19  9:48 PM   Specimen: BLOOD  Result Value Ref Range Status   Specimen Description BLOOD BLOOD RIGHT WRIST  Final   Special Requests   Final    BOTTLES DRAWN AEROBIC AND ANAEROBIC Blood Culture adequate volume   Culture  Setup Time   Final    GRAM POSITIVE COCCI ANAEROBIC BOTTLE Gram Stain Report Called to,Read Back By and Verified With: Geni Bers 1339 07/14/2019 BY Kaylyn Layer PENN Performed at [facility], [address], [place], [place] [address]    Culture PENDING  Incomplete   Report Status PENDING  Incomplete     Scheduled Meds: . aspirin EC  81 mg Oral Daily  . atorvastatin  40 mg Oral Daily  . clopidogrel  75 mg Oral Daily  . enoxaparin (LOVENOX) injection  30 mg Subcutaneous Q24H  . irbesartan  37.5 mg Oral Daily  . mirabegron ER  25 mg Oral QHS  . sertraline  100 mg Oral Daily   Continuous Infusions: . 0.9 % NaCl with KCl 20 mEq / L    . cefTRIAXone (ROCEPHIN)  IV 1 g (07/13/19 2159)    Procedures/Studies: CT Head Wo Contrast  Result Date: 07/06/2019 CLINICAL DATA:  Cognitive decline. Stroke suspected. Focal neuro deficit. EXAM: CT  HEAD WITHOUT CONTRAST TECHNIQUE: Contiguous axial images were obtained from the base of the skull through the vertex without intravenous contrast. COMPARISON:  01/01/2019 FINDINGS: Brain: Oblique imaging plane. There is no evidence for acute hemorrhage, hydrocephalus, mass lesion, or abnormal extra-axial fluid collection. No definite CT evidence for acute infarction. Diffuse loss of parenchymal volume is consistent with atrophy. Patchy low attenuation in the deep hemispheric and periventricular white matter is nonspecific, but likely reflects chronic microvascular ischemic demyelination. Vascular: No hyperdense vessel or unexpected calcification. Skull: No evidence for fracture. No worrisome lytic or sclerotic lesion. Sinuses/Orbits: The visualized paranasal sinuses and mastoid air cells are clear. Visualized portions of the globes and intraorbital fat are unremarkable. Other: None. IMPRESSION: 1. No acute intracranial abnormality. 2. Atrophy with chronic small vessel white matter ischemic disease. Electronically Signed   By: Misty Stanley M.D.   On: 07/06/2019 17:36   MR ANGIO HEAD WO CONTRAST  Result Date: 07/07/2019 CLINICAL DATA:  Altered mental status EXAM: MRI HEAD WITHOUT CONTRAST MRA HEAD WITHOUT CONTRAST TECHNIQUE: Multiplanar, multiecho pulse sequences of the brain and surrounding structures were obtained without intravenous contrast. Angiographic images of the head were obtained using MRA technique without contrast. COMPARISON:  None. FINDINGS: MRI HEAD Motion artifact is present. Brain: There is no acute infarction or intracranial hemorrhage. There is no intracranial mass, mass effect, or edema. There is no hydrocephalus or extra-axial fluid collection. Prominence of the ventricles and sulci reflects generalized parenchymal volume loss. Patchy T2 hyperintensity in the supratentorial white matter is nonspecific but may reflect mild to moderate chronic microvascular ischemic changes. Vascular: Major  vessel flow voids at the skull base are preserved. Skull and upper cervical spine: Normal marrow signal is preserved. Sinuses/Orbits: Minor mucosal thickening.  Orbits are unremarkable. Other: Sella is unremarkable.  Mastoid air cells are clear. MRA HEAD Motion artifact is present. Intracranial internal carotid arteries are patent. Proximal middle and anterior cerebral artery segments appear grossly patent but are poorly visualized. Intracranial vertebral arteries, basilar artery, and proximal posterior cerebral arteries are patent. IMPRESSION: Motion degraded studies. No evidence of acute infarction, hemorrhage, or mass. No proximal intracranial vessel occlusion. Electronically Signed   By: Macy Mis M.D.   On: 07/07/2019 13:00   MR BRAIN WO CONTRAST  Result Date:  07/07/2019 CLINICAL DATA:  Altered mental status EXAM: MRI HEAD WITHOUT CONTRAST MRA HEAD WITHOUT CONTRAST TECHNIQUE: Multiplanar, multiecho pulse sequences of the brain and surrounding structures were obtained without intravenous contrast. Angiographic images of the head were obtained using MRA technique without contrast. COMPARISON:  None. FINDINGS: MRI HEAD Motion artifact is present. Brain: There is no acute infarction or intracranial hemorrhage. There is no intracranial mass, mass effect, or edema. There is no hydrocephalus or extra-axial fluid collection. Prominence of the ventricles and sulci reflects generalized parenchymal volume loss. Patchy T2 hyperintensity in the supratentorial white matter is nonspecific but may reflect mild to moderate chronic microvascular ischemic changes. Vascular: Major vessel flow voids at the skull base are preserved. Skull and upper cervical spine: Normal marrow signal is preserved. Sinuses/Orbits: Minor mucosal thickening.  Orbits are unremarkable. Other: Sella is unremarkable.  Mastoid air cells are clear. MRA HEAD Motion artifact is present. Intracranial internal carotid arteries are patent. Proximal  middle and anterior cerebral artery segments appear grossly patent but are poorly visualized. Intracranial vertebral arteries, basilar artery, and proximal posterior cerebral arteries are patent. IMPRESSION: Motion degraded studies. No evidence of acute infarction, hemorrhage, or mass. No proximal intracranial vessel occlusion. Electronically Signed   By: Macy Mis M.D.   On: 07/07/2019 13:00   US Carotid Bilateral  Result Date: 07/07/2019 CLINICAL DATA:  TIA.  History of hypertension. EXAM: BILATERAL CAROTID DUPLEX ULTRASOUND TECHNIQUE: Pearline Cables scale imaging, color Doppler and duplex ultrasound were performed of bilateral carotid and vertebral arteries in the neck. COMPARISON:  None. FINDINGS: Criteria: Quantification of carotid stenosis is based on velocity parameters that correlate the residual internal carotid diameter with NASCET-based stenosis levels, using the diameter of the distal internal carotid lumen as the denominator for stenosis measurement. The following velocity measurements were obtained: RIGHT ICA: 216/19 cm/sec CCA: 18/2 cm/sec SYSTOLIC ICA/CCA RATIO:  2.2 ECA: 40 cm/sec LEFT ICA: 80/18 cm/sec CCA: 99/37 cm/sec SYSTOLIC ICA/CCA RATIO:  1.2 ECA: 112 cm/sec RIGHT CAROTID ARTERY: There is a large amount of eccentric echogenic plaque within the right carotid bulb (image 21 and 24), extending to involve the origin and proximal aspects of the right internal carotid artery (image 34), which results in elevated peak systolic velocities within the proximal aspect the right internal carotid artery. Greatest acquired peak systolic velocity with the proximal right ICA measures 216 centimeters/second (image 37). RIGHT VERTEBRAL ARTERY:  Antegrade Flow LEFT CAROTID ARTERY: There is a large amount of eccentric echogenic plaque within the left carotid bulb (images 68 and 71), extending to involve the origin and proximal aspects of the left internal carotid artery (image 82), morphologically resulting at  least 50% luminal narrowing, though definitely resulting in elevated peak systolic velocities within the interrogated course of the left internal carotid artery to suggest a hemodynamically significant stenosis. LEFT VERTEBRAL ARTERY:  Antegrade flow IMPRESSION: 1. Large amount of right-sided atherosclerotic plaque results in elevated peak systolic velocities within the right internal carotid artery which approach the greater than 70% luminal narrowing range. Further evaluation with CTA could performed as clinically indicated. 2. Large amount of left-sided atherosclerotic plaque, morphologically results in at least 50% luminal narrowing, though does not result in elevated peak systolic velocities within the interrogated course of the left internal carotid artery to suggest a hemodynamically significant stenosis. Again, further evaluation with CTA could performed as indicated. 3. Antegrade flow demonstrated within the bilateral vertebral arteries. Electronically Signed   By: Sandi Mariscal M.D.   On: 07/07/2019 16:33   DG  CHEST PORT 1 VIEW  Result Date: 07/12/2019 CLINICAL DATA:  Leukocytosis.  Altered mental status. EXAM: PORTABLE CHEST 1 VIEW COMPARISON:  03/10/2018 FINDINGS: Lung volumes are low.The cardiomediastinal contours are normal. Aortic atherosclerosis. Patient's chin partially obscures evaluation of the left lung apex, could not tolerate repositioning. Pulmonary vasculature is normal. No consolidation, pleural effusion, or pneumothorax. No acute osseous abnormalities are seen. IMPRESSION: Low lung volumes without acute abnormality. Aortic Atherosclerosis (ICD10-I70.0). Electronically Signed   By: Keith Rake M.D.   On: 07/12/2019 21:19   ECHOCARDIOGRAM COMPLETE  Result Date: 07/07/2019    ECHOCARDIOGRAM REPORT   Patient Name:   Deborah Farrell Date of Exam: 07/07/2019 Medical Rec #:  [id number]       Height:       66.0 in Accession #:    [id number]      Weight:       142.4 lb Date of Birth:   04/25/1917       BSA:          1.731 m Patient Age:    40 years        BP:           137/59 mmHg Patient Gender: F               HR:           67 bpm. Exam Location:  [facility] Procedure: 2D Echo Indications:    TIA 435.9 / G45.9  History:        Patient has no prior history of Echocardiogram examinations.                 Risk Factors:Hypertension, Dyslipidemia and Non-Smoker.  Sonographer:    [name] RDCS (AE) Referring Phys: [place]  1. Left ventricular ejection fraction, by estimation, is 65 to 70%. The left ventricle has normal function. The left ventricle has no regional wall motion abnormalities. There is moderate left ventricular hypertrophy. Left ventricular diastolic parameters are indeterminate.  2. Right ventricular systolic function is normal. The right ventricular size is normal. There is normal pulmonary artery systolic pressure.  3. The mitral valve is normal in structure. No evidence of mitral valve regurgitation. No evidence of mitral stenosis.  4. The aortic valve is tricuspid. Aortic valve regurgitation is not visualized. No aortic stenosis is present.  5. The inferior vena cava is normal in size with greater than 50% respiratory variability, suggesting right atrial pressure of 3 mmHg. FINDINGS  Left Ventricle: Left ventricular ejection fraction, by estimation, is 65 to 70%. The left ventricle has normal function. The left ventricle has no regional wall motion abnormalities. The left ventricular internal cavity size was normal in size. There is  moderate left ventricular hypertrophy. Left ventricular diastolic parameters are indeterminate. Right Ventricle: The right ventricular size is normal. Right vetricular wall thickness was not assessed. Right ventricular systolic function is normal. There is normal pulmonary artery systolic pressure. The tricuspid regurgitant velocity is 1.93 m/s, and with an assumed right atrial pressure of 10 mmHg, the estimated right  ventricular systolic pressure is 93.8 mmHg. Left Atrium: Left atrial size was normal in size. Right Atrium: Right atrial size was not well visualized. Pericardium: There is no evidence of pericardial effusion. Mitral Valve: The mitral valve is normal in structure. No evidence of mitral valve regurgitation. No evidence of mitral valve stenosis. Tricuspid Valve: The tricuspid valve is normal in structure. Tricuspid valve regurgitation is trivial. No evidence of tricuspid stenosis. Aortic  Valve: The aortic valve is tricuspid. Aortic valve regurgitation is not visualized. No aortic stenosis is present. Aortic valve mean gradient measures 2.6 mmHg. Aortic valve peak gradient measures 4.3 mmHg. Aortic valve area, by VTI measures 2.94 cm. Pulmonic Valve: The pulmonic valve was not well visualized. Pulmonic valve regurgitation is not visualized. No evidence of pulmonic stenosis. Aorta: The aortic root is normal in size and structure. Pulmonary Artery: Indeterminant PASP, inadequate TR jet. Venous: The inferior vena cava is normal in size with greater than 50% respiratory variability, suggesting right atrial pressure of 3 mmHg. IAS/Shunts: No atrial level shunt detected by color flow Doppler.  LEFT VENTRICLE PLAX 2D LVIDd:         3.54 cm  Diastology LVIDs:         1.88 cm  LV e' lateral:   7.07 cm/s LV PW:         1.26 cm  LV E/e' lateral: 9.7 LV IVS:        1.37 cm  LV e' medial:    5.44 cm/s LVOT diam:     2.00 cm  LV E/e' medial:  12.7 LV SV:         69 LV SV Index:   40 LVOT Area:     3.14 cm  RIGHT VENTRICLE RV S prime:     14.00 cm/s TAPSE (M-mode): 1.8 cm LEFT ATRIUM           Index       RIGHT ATRIUM           Index LA diam:      3.60 cm 2.08 cm/m  RA Area:     12.20 cm LA Vol (A2C): 30.5 ml 17.62 ml/m RA Volume:   29.70 ml  17.16 ml/m LA Vol (A4C): 38.8 ml 22.41 ml/m  AORTIC VALVE AV Area (Vmax):    2.98 cm AV Area (Vmean):   2.64 cm AV Area (VTI):     2.94 cm AV Vmax:           104.25 cm/s AV Vmean:           76.936 cm/s AV VTI:            0.236 m AV Peak Grad:      4.3 mmHg AV Mean Grad:      2.6 mmHg LVOT Vmax:         98.95 cm/s LVOT Vmean:        64.652 cm/s LVOT VTI:          0.221 m LVOT/AV VTI ratio: 0.94  AORTA Ao Root diam: 3.00 cm MITRAL VALVE               TRICUSPID VALVE MV Area (PHT): 3.03 cm    TR Peak grad:   14.9 mmHg MV Decel Time: 250 msec    TR Vmax:        193.00 cm/s MV E velocity: 68.90 cm/s MV A velocity: 57.70 cm/s  SHUNTS MV E/A ratio:  1.19        Systemic VTI:  0.22 m                            Systemic Diam: 2.00 cm Carlyle Dolly MD Electronically signed by Carlyle Dolly MD Signature Date/Time: 07/07/2019/3:58:13 PM    Final     Orson Eva, DO  [facility]  If 7PM-7AM, please contact night-coverage [email or web address] Password [email or web address] 07/14/2019, 1:55 PM  LOS: 5 days

## 2019-07-15 DIAGNOSIS — G9341 Metabolic encephalopathy: Secondary | ICD-10-CM

## 2019-07-15 DIAGNOSIS — R7881 Bacteremia: Secondary | ICD-10-CM

## 2019-07-15 LAB — BASIC METABOLIC PANEL
Anion gap: 10 (ref 5–15)
BUN: 28 mg/dL — ABNORMAL HIGH (ref 8–23)
CO2: 23 mmol/L (ref 22–32)
Calcium: 8.6 mg/dL — ABNORMAL LOW (ref 8.9–10.3)
Chloride: 105 mmol/L (ref 98–111)
Creatinine, Ser: 0.72 mg/dL (ref 0.44–1.00)
GFR calc Af Amer: >60 mL/min (ref >60)
GFR calc non Af Amer: >60 mL/min (ref >60)
Glucose, Bld: 122 mg/dL — ABNORMAL HIGH (ref 70–99)
Potassium: 3.5 mmol/L (ref 3.5–5.1)
Sodium: 138 mmol/L (ref 135–145)

## 2019-07-15 LAB — CBC
HCT: 29.6 % — ABNORMAL LOW (ref 36.0–46.0)
Hemoglobin: 9.4 g/dL — ABNORMAL LOW (ref 12.0–15.0)
MCH: 31 pg (ref 26.0–34.0)
MCHC: 31.8 g/dL (ref 30.0–36.0)
MCV: 97.7 fL (ref 80.0–100.0)
Platelets: 249 10*3/uL (ref 150–400)
RBC: 3.03 MIL/uL — ABNORMAL LOW (ref 3.87–5.11)
RDW: 12.9 % (ref 11.5–15.5)
WBC: 15.5 10*3/uL — ABNORMAL HIGH (ref 4.0–10.5)
nRBC: 0 % (ref 0.0–0.2)

## 2019-07-15 MED ORDER — VANCOMYCIN HCL 1250 MG/250ML IV SOLN
1250.0000 mg | Freq: Once | INTRAVENOUS | Status: AC
  Administered 2019-07-15: 1250 mg via INTRAVENOUS
  Filled 2019-07-15: qty 250

## 2019-07-15 MED ORDER — POTASSIUM CHLORIDE IN NACL 20-0.9 MEQ/L-% IV SOLN: INTRAVENOUS | Status: AC

## 2019-07-15 MED ORDER — ACETAMINOPHEN 650 MG RE SUPP
650.0000 mg | RECTAL | Status: DC | PRN
  Administered 2019-07-18 – 2019-07-20 (×2): 650 mg via RECTAL
  Filled 2019-07-15 (×4): qty 1

## 2019-07-15 MED ORDER — VANCOMYCIN HCL 750 MG/150ML IV SOLN
750.0000 mg | INTRAVENOUS | Status: DC
  Administered 2019-07-16 – 2019-07-20 (×5): 750 mg via INTRAVENOUS
  Filled 2019-07-15 (×4): qty 150

## 2019-07-15 NOTE — Progress Notes (Addendum)
PROGRESS NOTE  Deborah Farrell ZOX:[id number] DOB: [birth year] DOA: 07/06/2019 PCP: Celene Squibb, MD  Brief History:  86 year old female with a history of dementia, hypertension, IBS presenting with lethargy from [place].  According to the patient's niece, the patient is able to ambulate with a walker and is able to speak and carry on a conversation albeit she is confused at baseline.  She is normally able to feed herself but does require assistance with her other activities of daily living.  In addition, there was new onset left facial droop and left-sided weakness.  Since admission, MRI of the brain was obtained which showed no acute findings.  However neurology was consulted, and there was concern for TIA.  The patient was started on aspirin and Plavix.  Patient continued to be somnolent requiring assistance with feeding.  PT evaluated the patient and recommended skilled nursing facility.  She remained encephalopathic.  As result, oral intake remained poor.  Assessment/Plan: TIA/facial droop/left-sided weakness -Secondary to TIA -MRI brain negative -Patient seen by neurology--> recommended aspirin Plavix x3 months followed by Plavix monotherapy daily -Carotid ultrasound showed bilateral carotid occlusion, right-sided 70% and 50% left side. -Niece did not want any aggressive intervention  CoNS Bacteremia -unclear source -repeat blood culture -start IV vanco   Acute toxic/metabolic encephalopathy -Discontinue clonazepam -Continue empiric ceftriaxone -Personally reviewed chest x-ray--bibasilar opacities -Check ammonia--28 -Check serum W11--914 -Check folic NWGN--56.2 -TSH 1.308 -07/12/2019 UA--no pyuria -EEG--neg for seizure -7/1--more alert--screamed "stop it you son of a bitch"  Leukocytosis -remains hemodynamically stable -CT chest--no infiltrates, no effusions or edema -follow blood and urine culture  Essential hypertension -Continue  ARB  Hyperlipidemia -Continue statin  Anxiety/depression -Holding clonazepam -continue sertraline  Hypokalemia -replete -check mag 2.1  Goals of Care -Advance care planning, including the explanation and discussion of advance directives was carried out with the patient and family.  Code status including explanations of "Full Code" and "DNR" and alternatives were discussed in detail.  Discussion of end-of-life issues including but not limited palliative care, hospice care and the concept of hospice, other end-of-life care options, power of attorney for health care decisions, living wills, and physician orders for life-sustaining treatment were also discussed with the patient and family.  Total face to face time 16 minutes. -confirmed DNR with niece     Status is: Inpatient  Remains inpatient appropriate because remains encephalopathic on IV abx   Dispo: The patient is from: ALF  Anticipated d/c is to: SNF  Anticipated d/c date is: 3 days  Patient currently is not medically stable to d/c.        Family Communication:   Niece updated 7/1  Consultants:  neuro  Code Status:  DNR  DVT Prophylaxis:   [place] Lovenox   Procedures: As Listed in Progress Note Above  Antibiotics: Ceftriaxone 6/29>>> vanco 7/1>>>    Subjective: Yells "stop it you son of a bitch" to protopathic stimuli.  Denies cp.  Remainder ROS unobtainable  Objective: Vitals:   07/14/19 0440 07/14/19 2235 07/15/19 0627 07/15/19 1353  BP: (!) 161/77 (!) 165/76 (!) 162/69 (!) 162/66  Pulse: 95 89 97 94  Resp: (!) 24 20 20  (!) 24  Temp: 99.5 F (37.5 C) 100 F (37.8 C) 99.2 F (37.3 C) 100.1 F (37.8 C)  TempSrc: Oral Oral Oral Oral  SpO2: 95% 98% 95% 94%  Weight:      Height:        Intake/Output Summary (Last  24 hours) at 07/15/2019 1745 Last data filed at 07/15/2019 1741 Gross per 24 hour  Intake --  Output 1050 ml  Net -1050 ml    Weight change:  Exam:   General:  Pt is alert, does not follow commands appropriately, not in acute distress  HEENT: No icterus, No thrush, No neck mass, [place]/AT  Cardiovascular: RRR, S1/S2, no rubs, no gallops  Respiratory:bibasilar rales. No wheeze  Abdomen: Soft/+BS, non tender, non distended, no guarding  Extremities: No edema, No lymphangitis, No petechiae, No rashes, no synovitis   Data Reviewed: I have personally reviewed following labs and imaging studies Basic Metabolic Panel: Recent Labs  Lab 07/12/19 1250 07/14/19 0616 07/14/19 1415 07/15/19 0448  NA 136 136  --  138  K 3.6 3.4*  --  3.5  CL 100 101  --  105  CO2 23 23  --  23  GLUCOSE 123* 131*  --  122*  BUN 26* 25*  --  28*  CREATININE 0.78 0.75  --  0.72  CALCIUM 8.8* 8.7*  --  8.6*  MG  --   --  2.1  --    Liver Function Tests: Recent Labs  Lab 07/14/19 0616  AST 24  ALT 25  ALKPHOS 58  BILITOT 0.7  PROT 6.8  ALBUMIN 3.1*   No results for input(s): LIPASE, AMYLASE in the last 168 hours. Recent Labs  Lab 07/14/19 1415  AMMONIA 28   Coagulation Profile: No results for input(s): INR, PROTIME in the last 168 hours. CBC: Recent Labs  Lab 07/12/19 1250 07/13/19 0613 07/14/19 0616 07/15/19 0448  WBC 14.2* 14.6* 15.2* 15.5*  HGB 10.5* 10.7* 10.0* 9.4*  HCT 32.6* 33.1* 30.9* 29.6*  MCV 98.2 98.5 96.3 97.7  PLT 257 214 224 249   Cardiac Enzymes: No results for input(s): CKTOTAL, CKMB, CKMBINDEX, TROPONINI in the last 168 hours. BNP: Invalid input(s): POCBNP CBG: No results for input(s): GLUCAP in the last 168 hours. HbA1C: No results for input(s): HGBA1C in the last 72 hours. Urine analysis:    Component Value Date/Time   COLORURINE YELLOW 07/12/2019 1609   APPEARANCEUR CLOUDY (A) 07/12/2019 1609   LABSPEC 1.019 07/12/2019 1609   PHURINE 5.0 07/12/2019 1609   GLUCOSEU NEGATIVE 07/12/2019 1609   HGBUR NEGATIVE 07/12/2019 1609   BILIRUBINUR NEGATIVE 07/12/2019 1609    KETONESUR NEGATIVE 07/12/2019 1609   PROTEINUR 100 (A) 07/12/2019 1609   UROBILINOGEN 1.0 11/02/2011 2208   NITRITE NEGATIVE 07/12/2019 1609   LEUKOCYTESUR NEGATIVE 07/12/2019 1609   Sepsis Labs: @LABRCNTIP (procalcitonin:4,lacticidven:4) ) Recent Results (from the past 240 hour(s))  SARS Coronavirus 2 by RT PCR (hospital order, performed in [place] hospital lab) Nasopharyngeal Nasopharyngeal Swab     Status: None   Collection Time: 07/06/19  5:04 PM   Specimen: Nasopharyngeal Swab  Result Value Ref Range Status   SARS Coronavirus 2 NEGATIVE NEGATIVE Final    Comment: (NOTE) SARS-CoV-2 target nucleic acids are NOT DETECTED.  The SARS-CoV-2 RNA is generally detectable in upper and lower respiratory specimens during the acute phase of infection. The lowest concentration of SARS-CoV-2 viral copies this assay can detect is 250 copies / mL. A negative result does not preclude SARS-CoV-2 infection and should not be used as the sole basis for treatment or other patient management decisions.  A negative result may occur with improper specimen collection / handling, submission of specimen other than nasopharyngeal swab, presence of viral mutation(s) within the areas targeted by this assay, and inadequate number  of viral copies (<250 copies / mL). A negative result must be combined with clinical observations, patient history, and epidemiological information.  Fact Sheet for Patients:   StrictlyIdeas.no  Fact Sheet for Healthcare Providers: BankingDealers.co.za  This test is not yet approved or  cleared by the [place] FDA and has been authorized for detection and/or diagnosis of SARS-CoV-2 by FDA under an Emergency Use Authorization (EUA).  This EUA will remain in effect (meaning this test can be used) for the duration of the COVID-19 declaration under Section 564(b)(1) of the Act, 21 U.S.C. section 360bbb-3(b)(1), unless the  authorization is terminated or revoked sooner.  Performed at [facility], [address], [place], [place] [address]   MRSA PCR Screening     Status: None   Collection Time: 07/07/19  5:25 AM   Specimen: Nasal Mucosa; Nasopharyngeal  Result Value Ref Range Status   MRSA by PCR NEGATIVE NEGATIVE Final    Comment:        The GeneXpert MRSA Assay (FDA approved for NASAL specimens only), is one component of a comprehensive MRSA colonization surveillance program. It is not intended to diagnose MRSA infection nor to guide or monitor treatment for MRSA infections. Performed at [facility], [address], [place], [place] [address]   SARS Coronavirus 2 by RT PCR (hospital order, performed in [facility] hospital lab) Nasopharyngeal Nasopharyngeal Swab     Status: None   Collection Time: 07/12/19 11:56 AM   Specimen: Nasopharyngeal Swab  Result Value Ref Range Status   SARS Coronavirus 2 NEGATIVE NEGATIVE Final    Comment: (NOTE) SARS-CoV-2 target nucleic acids are NOT DETECTED.  The SARS-CoV-2 RNA is generally detectable in upper and lower respiratory specimens during the acute phase of infection. The lowest concentration of SARS-CoV-2 viral copies this assay can detect is 250 copies / mL. A negative result does not preclude SARS-CoV-2 infection and should not be used as the sole basis for treatment or other patient management decisions.  A negative result may occur with improper specimen collection / handling, submission of specimen other than nasopharyngeal swab, presence of viral mutation(s) within the areas targeted by this assay, and inadequate number of viral copies (<250 copies / mL). A negative result must be combined with clinical observations, patient history, and epidemiological information.  Fact Sheet for Patients:   StrictlyIdeas.no  Fact Sheet for Healthcare Providers: BankingDealers.co.za  This test is not yet  approved or  cleared by the [place] FDA and has been authorized for detection and/or diagnosis of SARS-CoV-2 by FDA under an Emergency Use Authorization (EUA).  This EUA will remain in effect (meaning this test can be used) for the duration of the COVID-19 declaration under Section 564(b)(1) of the Act, 21 U.S.C. section 360bbb-3(b)(1), unless the authorization is terminated or revoked sooner.  Performed at [facility], [address] [place], [place] [address]   Culture, blood (routine x 2)     Status: Abnormal (Preliminary result)   Collection Time: 07/13/19  9:48 PM   Specimen: BLOOD RIGHT HAND  Result Value Ref Range Status   Specimen Description   Final    BLOOD RIGHT HAND Performed at [facility], [address] [place], [place] [address]    Special Requests   Final    BOTTLES DRAWN AEROBIC AND ANAEROBIC Blood Culture adequate volume Performed at [facility], [address] [place], [place] [address]    Culture  Setup Time   Final    GRAM POSITIVE COCCI IN  BOTH AEROBIC AND ANAEROBIC BOTTLES Gram Stain Report Called to,Read Back By and Verified With:  TAYLOR,M [id number] 07/14/2019 BY TJONES Performed at [facility], [address], [place], [place] [address]    Culture STAPHYLOCOCCUS HOMINIS (A)  Final   Report Status PENDING  Incomplete  Culture, blood (routine x 2)     Status: Abnormal (Preliminary result)   Collection Time: 07/13/19  9:48 PM   Specimen: BLOOD  Result Value Ref Range Status   Specimen Description   Final    BLOOD BLOOD RIGHT WRIST Performed at [facility], [address], [place], [place] [address]    Special Requests   Final    BOTTLES DRAWN AEROBIC AND ANAEROBIC Blood Culture adequate volume Performed at [facility], [address] [place], [place] [address]    Culture  Setup Time   Final    GRAM POSITIVE COCCI ANAEROBIC BOTTLE Gram Stain Report Called to,Read Back By and Verified With: Ardis Hughs 07/14/2019 BY TJONES [place] CRITICAL  RESULT CALLED TO, READ BACK BY AND VERIFIED WITH: Geni Bers RN 07/14/19 1906 JDW    Culture (A)  Final    STAPHYLOCOCCUS HOMINIS SUSCEPTIBILITIES TO FOLLOW Performed at [facility], [place] [address] [place], [place] [address]    Report Status PENDING  Incomplete  Blood Culture ID Panel (Reflexed)     Status: Abnormal   Collection Time: 07/13/19  9:48 PM  Result Value Ref Range Status   Enterococcus species NOT DETECTED NOT DETECTED Final   Listeria monocytogenes NOT DETECTED NOT DETECTED Final   Staphylococcus species DETECTED (A) NOT DETECTED Final    Comment: Methicillin (oxacillin) resistant coagulase negative staphylococcus. Possible blood culture contaminant (unless isolated from more than one blood culture draw or clinical case suggests pathogenicity). No antibiotic treatment is indicated for blood  culture contaminants. CRITICAL RESULT CALLED TO, READ BACK BY AND VERIFIED WITH: Geni Bers RN 07/14/19 1906 JDW    Staphylococcus aureus (BCID) NOT DETECTED NOT DETECTED Final   Methicillin resistance DETECTED (A) NOT DETECTED Final    Comment: CRITICAL RESULT CALLED TO, READ BACK BY AND VERIFIED WITH: Geni Bers RN 07/14/19 1906 JDW    Streptococcus species NOT DETECTED NOT DETECTED Final   Streptococcus agalactiae NOT DETECTED NOT DETECTED Final   Streptococcus pneumoniae NOT DETECTED NOT DETECTED Final   Streptococcus pyogenes NOT DETECTED NOT DETECTED Final   Acinetobacter baumannii NOT DETECTED NOT DETECTED Final   Enterobacteriaceae species NOT DETECTED NOT DETECTED Final   Enterobacter cloacae complex NOT DETECTED NOT DETECTED Final   Escherichia coli NOT DETECTED NOT DETECTED Final   Klebsiella oxytoca NOT DETECTED NOT DETECTED Final   Klebsiella pneumoniae NOT DETECTED NOT DETECTED Final   Proteus species NOT DETECTED NOT DETECTED Final   Serratia marcescens NOT DETECTED NOT DETECTED Final   Haemophilus influenzae NOT DETECTED NOT DETECTED Final   Neisseria meningitidis  NOT DETECTED NOT DETECTED Final   Pseudomonas aeruginosa NOT DETECTED NOT DETECTED Final   Candida albicans NOT DETECTED NOT DETECTED Final   Candida glabrata NOT DETECTED NOT DETECTED Final   Candida krusei NOT DETECTED NOT DETECTED Final   Candida parapsilosis NOT DETECTED NOT DETECTED Final   Candida tropicalis NOT DETECTED NOT DETECTED Final    Comment: Performed at [facility], [place]. [address] [place], [place] [address]  Culture, Urine     Status: Abnormal (Preliminary result)   Collection Time: 07/14/19  3:54 AM   Specimen: Urine, Clean Catch  Result Value  Ref Range Status   Specimen Description   Final    URINE, CLEAN CATCH Performed at [facility], [address], [place], [place] [address]    Special Requests   Final    NONE Performed at [facility], [address] [place], [place] [address]    Culture (A)  Final    30,000 COLONIES/mL KLEBSIELLA PNEUMONIAE CULTURE REINCUBATED FOR BETTER GROWTH Performed at [place] [address], [place], [place] [address]    Report Status PENDING  Incomplete     Scheduled Meds: . aspirin EC  81 mg Oral Daily  . atorvastatin  40 mg Oral Daily  . clopidogrel  75 mg Oral Daily  . enoxaparin (LOVENOX) injection  40 mg Subcutaneous Q24H  . irbesartan  37.5 mg Oral Daily  . mirabegron ER  25 mg Oral QHS  . sertraline  100 mg Oral Daily   Continuous Infusions: . cefTRIAXone (ROCEPHIN)  IV 1 g (07/14/19 2138)  . [START ON 07/16/2019] vancomycin      Procedures/Studies: CT Head Wo Contrast  Result Date: 07/06/2019 CLINICAL DATA:  Cognitive decline. Stroke suspected. Focal neuro deficit. EXAM: CT HEAD WITHOUT CONTRAST TECHNIQUE: Contiguous axial images were obtained from the base of the skull through the vertex without intravenous contrast. COMPARISON:  01/01/2019 FINDINGS: Brain: Oblique imaging plane. There is no evidence for acute hemorrhage, hydrocephalus, mass lesion, or abnormal extra-axial fluid collection. No  definite CT evidence for acute infarction. Diffuse loss of parenchymal volume is consistent with atrophy. Patchy low attenuation in the deep hemispheric and periventricular white matter is nonspecific, but likely reflects chronic microvascular ischemic demyelination. Vascular: No hyperdense vessel or unexpected calcification. Skull: No evidence for fracture. No worrisome lytic or sclerotic lesion. Sinuses/Orbits: The visualized paranasal sinuses and mastoid air cells are clear. Visualized portions of the globes and intraorbital fat are unremarkable. Other: None. IMPRESSION: 1. No acute intracranial abnormality. 2. Atrophy with chronic small vessel white matter ischemic disease. Electronically Signed   By: Misty Stanley M.D.   On: 07/06/2019 17:36   CT CHEST WO CONTRAST  Result Date: 07/14/2019 CLINICAL DATA:  Cough with abnormal chest radiograph. Evaluate for pneumonia, pleural effusion or abscess. EXAM: CT CHEST WITHOUT CONTRAST TECHNIQUE: Multidetector CT imaging of the chest was performed following the standard protocol without IV contrast. COMPARISON:  Chest radiograph 07/12/2019 FINDINGS: Cardiovascular: Cardiac enlargement. Aortic atherosclerosis. Three vessel coronary artery atherosclerotic calcifications. Mediastinum/Nodes: Normal appearance of the thyroid gland. The trachea appears patent and is midline. Normal appearance of the esophagus. No enlarged mediastinal, supraclavicular, or axillary lymph nodes. Lungs/Pleura: Lung window detail is diminished by respiratory motion artifact. No significant pleural effusion. Mild posterior pleural thickening is identified bilaterally which is favored to represent dependent changes. No airspace consolidation, atelectasis or pneumothorax. No findings of a pulmonary abscess. Scar identified within the posterolateral right lower lobe. No suspicious pulmonary nodule or mass identified Upper Abdomen: No acute abnormality within the limited images through the upper  abdomen. Musculoskeletal: Spondylosis noted within the thoracic spine. No acute or suspicious osseous abnormality. IMPRESSION: 1. No acute cardiopulmonary abnormalities. No evidence for pulmonary abscess, pleural effusion, or pneumonia. 2. Cardiac enlargement, aortic atherosclerosis and 3 vessel coronary artery atherosclerotic calcifications. Aortic Atherosclerosis (ICD10-I70.0). Electronically Signed   By: Kerby Moors M.D.   On: 07/14/2019 16:34   MR ANGIO HEAD WO CONTRAST  Result Date: 07/07/2019 CLINICAL DATA:  Altered mental status EXAM: MRI HEAD WITHOUT CONTRAST MRA HEAD WITHOUT CONTRAST TECHNIQUE: Multiplanar, multiecho pulse sequences of  the brain and surrounding structures were obtained without intravenous contrast. Angiographic images of the head were obtained using MRA technique without contrast. COMPARISON:  None. FINDINGS: MRI HEAD Motion artifact is present. Brain: There is no acute infarction or intracranial hemorrhage. There is no intracranial mass, mass effect, or edema. There is no hydrocephalus or extra-axial fluid collection. Prominence of the ventricles and sulci reflects generalized parenchymal volume loss. Patchy T2 hyperintensity in the supratentorial white matter is nonspecific but may reflect mild to moderate chronic microvascular ischemic changes. Vascular: Major vessel flow voids at the skull base are preserved. Skull and upper cervical spine: Normal marrow signal is preserved. Sinuses/Orbits: Minor mucosal thickening.  Orbits are unremarkable. Other: Sella is unremarkable.  Mastoid air cells are clear. MRA HEAD Motion artifact is present. Intracranial internal carotid arteries are patent. Proximal middle and anterior cerebral artery segments appear grossly patent but are poorly visualized. Intracranial vertebral arteries, basilar artery, and proximal posterior cerebral arteries are patent. IMPRESSION: Motion degraded studies. No evidence of acute infarction, hemorrhage, or mass. No  proximal intracranial vessel occlusion. Electronically Signed   By: Macy Mis M.D.   On: 07/07/2019 13:00   MR BRAIN WO CONTRAST  Result Date: 07/07/2019 CLINICAL DATA:  Altered mental status EXAM: MRI HEAD WITHOUT CONTRAST MRA HEAD WITHOUT CONTRAST TECHNIQUE: Multiplanar, multiecho pulse sequences of the brain and surrounding structures were obtained without intravenous contrast. Angiographic images of the head were obtained using MRA technique without contrast. COMPARISON:  None. FINDINGS: MRI HEAD Motion artifact is present. Brain: There is no acute infarction or intracranial hemorrhage. There is no intracranial mass, mass effect, or edema. There is no hydrocephalus or extra-axial fluid collection. Prominence of the ventricles and sulci reflects generalized parenchymal volume loss. Patchy T2 hyperintensity in the supratentorial white matter is nonspecific but may reflect mild to moderate chronic microvascular ischemic changes. Vascular: Major vessel flow voids at the skull base are preserved. Skull and upper cervical spine: Normal marrow signal is preserved. Sinuses/Orbits: Minor mucosal thickening.  Orbits are unremarkable. Other: Sella is unremarkable.  Mastoid air cells are clear. MRA HEAD Motion artifact is present. Intracranial internal carotid arteries are patent. Proximal middle and anterior cerebral artery segments appear grossly patent but are poorly visualized. Intracranial vertebral arteries, basilar artery, and proximal posterior cerebral arteries are patent. IMPRESSION: Motion degraded studies. No evidence of acute infarction, hemorrhage, or mass. No proximal intracranial vessel occlusion. Electronically Signed   By: Macy Mis M.D.   On: 07/07/2019 13:00   US Carotid Bilateral  Result Date: 07/07/2019 CLINICAL DATA:  TIA.  History of hypertension. EXAM: BILATERAL CAROTID DUPLEX ULTRASOUND TECHNIQUE: Pearline Cables scale imaging, color Doppler and duplex ultrasound were performed of bilateral  carotid and vertebral arteries in the neck. COMPARISON:  None. FINDINGS: Criteria: Quantification of carotid stenosis is based on velocity parameters that correlate the residual internal carotid diameter with NASCET-based stenosis levels, using the diameter of the distal internal carotid lumen as the denominator for stenosis measurement. The following velocity measurements were obtained: RIGHT ICA: 216/19 cm/sec CCA: 22/2 cm/sec SYSTOLIC ICA/CCA RATIO:  2.2 ECA: 40 cm/sec LEFT ICA: 80/18 cm/sec CCA: 97/98 cm/sec SYSTOLIC ICA/CCA RATIO:  1.2 ECA: 112 cm/sec RIGHT CAROTID ARTERY: There is a large amount of eccentric echogenic plaque within the right carotid bulb (image 21 and 24), extending to involve the origin and proximal aspects of the right internal carotid artery (image 34), which results in elevated peak systolic velocities within the proximal aspect the right internal carotid artery. Greatest acquired  peak systolic velocity with the proximal right ICA measures 216 centimeters/second (image 37). RIGHT VERTEBRAL ARTERY:  Antegrade Flow LEFT CAROTID ARTERY: There is a large amount of eccentric echogenic plaque within the left carotid bulb (images 68 and 71), extending to involve the origin and proximal aspects of the left internal carotid artery (image 82), morphologically resulting at least 50% luminal narrowing, though definitely resulting in elevated peak systolic velocities within the interrogated course of the left internal carotid artery to suggest a hemodynamically significant stenosis. LEFT VERTEBRAL ARTERY:  Antegrade flow IMPRESSION: 1. Large amount of right-sided atherosclerotic plaque results in elevated peak systolic velocities within the right internal carotid artery which approach the greater than 70% luminal narrowing range. Further evaluation with CTA could performed as clinically indicated. 2. Large amount of left-sided atherosclerotic plaque, morphologically results in at least 50% luminal  narrowing, though does not result in elevated peak systolic velocities within the interrogated course of the left internal carotid artery to suggest a hemodynamically significant stenosis. Again, further evaluation with CTA could performed as indicated. 3. Antegrade flow demonstrated within the bilateral vertebral arteries. Electronically Signed   By: Sandi Mariscal M.D.   On: 07/07/2019 16:33   DG CHEST PORT 1 VIEW  Result Date: 07/12/2019 CLINICAL DATA:  Leukocytosis.  Altered mental status. EXAM: PORTABLE CHEST 1 VIEW COMPARISON:  03/10/2018 FINDINGS: Lung volumes are low.The cardiomediastinal contours are normal. Aortic atherosclerosis. Patient's chin partially obscures evaluation of the left lung apex, could not tolerate repositioning. Pulmonary vasculature is normal. No consolidation, pleural effusion, or pneumothorax. No acute osseous abnormalities are seen. IMPRESSION: Low lung volumes without acute abnormality. Aortic Atherosclerosis (ICD10-I70.0). Electronically Signed   By: Keith Rake M.D.   On: 07/12/2019 21:19   EEG adult  Result Date: 07/14/2019 Alexis Goodell, MD     07/14/2019  3:49 PM ELECTROENCEPHALOGRAM REPORT Patient: [name]       Room #: A66 Age: 86 y.o.        Sex: female Requesting Physician: Geramy Lamorte Report Date:  07/14/2019       Interpreting Physician: Alexis Goodell History: KELLA SPLINTER is an 86 y.o. female with altered mental status Medications: ASA, Lipitor, Rocephin, Plavix, Avapro, Myrbetriq, Zoloft Conditions of Recording:  This is a 21 channel routine scalp EEG performed with bipolar and monopolar montages arranged in accordance to the international 10/20 system of electrode placement. One channel was dedicated to EKG recording. The patient is in the drowsy and asleep states. Description:  The patient appears asleep for much of the recording.  The background is slow and poorly organized with superimposed symmetrical sleep spindles and vertex central sharp  transients noted.  Towards the end of the recording the patient does open her eyes and although she does not respond the background does activate with a mixture of theta and alpha activity noted.  No epileptiform activity is noted. Hyperventilation and intermittent photic stimulation were not performed. IMPRESSION: This electroencephalogram is consistent with normal drowse and sleep.  No epileptiform activity is noted.  Alexis Goodell, MD Neurology [phone] 07/14/2019, 3:44 PM   ECHOCARDIOGRAM COMPLETE  Result Date: 07/07/2019    ECHOCARDIOGRAM REPORT   Patient Name:   YOMAIRA SOLAR Date of Exam: 07/07/2019 Medical Rec #:  [id number]       Height:       66.0 in Accession #:    [id number]      Weight:       142.4 lb Date of Birth:  November 26, 1917  BSA:          1.731 m Patient Age:    54 years        BP:           137/59 mmHg Patient Gender: F               HR:           67 bpm. Exam Location:  [facility] Procedure: 2D Echo Indications:    TIA 435.9 / G45.9  History:        Patient has no prior history of Echocardiogram examinations.                 Risk Factors:Hypertension, Dyslipidemia and Non-Smoker.  Sonographer:    [name] RDCS (AE) Referring Phys: [place]  1. Left ventricular ejection fraction, by estimation, is 65 to 70%. The left ventricle has normal function. The left ventricle has no regional wall motion abnormalities. There is moderate left ventricular hypertrophy. Left ventricular diastolic parameters are indeterminate.  2. Right ventricular systolic function is normal. The right ventricular size is normal. There is normal pulmonary artery systolic pressure.  3. The mitral valve is normal in structure. No evidence of mitral valve regurgitation. No evidence of mitral stenosis.  4. The aortic valve is tricuspid. Aortic valve regurgitation is not visualized. No aortic stenosis is present.  5. The inferior vena cava is normal in size with greater than 50% respiratory  variability, suggesting right atrial pressure of 3 mmHg. FINDINGS  Left Ventricle: Left ventricular ejection fraction, by estimation, is 65 to 70%. The left ventricle has normal function. The left ventricle has no regional wall motion abnormalities. The left ventricular internal cavity size was normal in size. There is  moderate left ventricular hypertrophy. Left ventricular diastolic parameters are indeterminate. Right Ventricle: The right ventricular size is normal. Right vetricular wall thickness was not assessed. Right ventricular systolic function is normal. There is normal pulmonary artery systolic pressure. The tricuspid regurgitant velocity is 1.93 m/s, and with an assumed right atrial pressure of 10 mmHg, the estimated right ventricular systolic pressure is 87.6 mmHg. Left Atrium: Left atrial size was normal in size. Right Atrium: Right atrial size was not well visualized. Pericardium: There is no evidence of pericardial effusion. Mitral Valve: The mitral valve is normal in structure. No evidence of mitral valve regurgitation. No evidence of mitral valve stenosis. Tricuspid Valve: The tricuspid valve is normal in structure. Tricuspid valve regurgitation is trivial. No evidence of tricuspid stenosis. Aortic Valve: The aortic valve is tricuspid. Aortic valve regurgitation is not visualized. No aortic stenosis is present. Aortic valve mean gradient measures 2.6 mmHg. Aortic valve peak gradient measures 4.3 mmHg. Aortic valve area, by VTI measures 2.94 cm. Pulmonic Valve: The pulmonic valve was not well visualized. Pulmonic valve regurgitation is not visualized. No evidence of pulmonic stenosis. Aorta: The aortic root is normal in size and structure. Pulmonary Artery: Indeterminant PASP, inadequate TR jet. Venous: The inferior vena cava is normal in size with greater than 50% respiratory variability, suggesting right atrial pressure of 3 mmHg. IAS/Shunts: No atrial level shunt detected by color flow Doppler.   LEFT VENTRICLE PLAX 2D LVIDd:         3.54 cm  Diastology LVIDs:         1.88 cm  LV e' lateral:   7.07 cm/s LV PW:         1.26 cm  LV E/e' lateral: 9.7 LV IVS:  1.37 cm  LV e' medial:    5.44 cm/s LVOT diam:     2.00 cm  LV E/e' medial:  12.7 LV SV:         69 LV SV Index:   40 LVOT Area:     3.14 cm  RIGHT VENTRICLE RV S prime:     14.00 cm/s TAPSE (M-mode): 1.8 cm LEFT ATRIUM           Index       RIGHT ATRIUM           Index LA diam:      3.60 cm 2.08 cm/m  RA Area:     12.20 cm LA Vol (A2C): 30.5 ml 17.62 ml/m RA Volume:   29.70 ml  17.16 ml/m LA Vol (A4C): 38.8 ml 22.41 ml/m  AORTIC VALVE AV Area (Vmax):    2.98 cm AV Area (Vmean):   2.64 cm AV Area (VTI):     2.94 cm AV Vmax:           104.25 cm/s AV Vmean:          76.936 cm/s AV VTI:            0.236 m AV Peak Grad:      4.3 mmHg AV Mean Grad:      2.6 mmHg LVOT Vmax:         98.95 cm/s LVOT Vmean:        64.652 cm/s LVOT VTI:          0.221 m LVOT/AV VTI ratio: 0.94  AORTA Ao Root diam: 3.00 cm MITRAL VALVE               TRICUSPID VALVE MV Area (PHT): 3.03 cm    TR Peak grad:   14.9 mmHg MV Decel Time: 250 msec    TR Vmax:        193.00 cm/s MV E velocity: 68.90 cm/s MV A velocity: 57.70 cm/s  SHUNTS MV E/A ratio:  1.19        Systemic VTI:  0.22 m                            Systemic Diam: 2.00 cm Carlyle Dolly MD Electronically signed by Carlyle Dolly MD Signature Date/Time: 07/07/2019/3:58:13 PM    Final     Orson Eva, DO  [facility]  If 7PM-7AM, please contact night-coverage [email or web address] Password [email or web address] 07/15/2019, 5:45 PM   LOS: 6 days

## 2019-07-15 NOTE — Progress Notes (Signed)
Physical Therapy Treatment Patient Details Name: Deborah Farrell MRN: [id number] DOB: 1917-06-07 Today's Date: 07/15/2019    History of Present Illness Deborah Farrell is a 86 y.o. female with medical history significant for dementia, hypertension, IBS.  Patient was brought to the ED via EMS with reports of altered mental status, patient unable to walk and talk as per her baseline.  Also reports of left facial droop and left-sided weakness.  Last known normal was more than 30 hours ago.At the time of my evaluation, patient is awake alert, able to tell me her name, answer a few simple questions, but she does not know why she is in the hospital.  Per nursing home staff, patient seemed weak and not ambulatory, patient had seem like this since Sunday night.  Patient was evaluated by the supervising doctor today and referred to the ED.Patient tells me she intermittently has pain with urination.  At baseline, patient is incontinent, and ambulatory with a walker.    PT Comments    Patient very weak and required Max assist to sit up at bedside with slow labored movement and facial expressions of severe pain when moving BLE, required active assistance to complete exercises while sitting at bedside, Max assist to laterally scoot and unable to stand due to BLE weakness.  Patient put back to bed with Max assist to reposition.  Patient will benefit from continued physical therapy in hospital and recommended venue below to increase strength, balance, endurance for safe ADLs and gait.    Follow Up Recommendations  SNF;Supervision for mobility/OOB;Supervision - Intermittent     Equipment Recommendations  None recommended by PT    Recommendations for Other Services       Precautions / Restrictions Precautions Precautions: Fall Restrictions Weight Bearing Restrictions: No    Mobility  Bed Mobility Overal bed mobility: Needs Assistance Bed Mobility: Supine to Sit;Sit to Supine     Supine to sit: Max  assist Sit to supine: Max assist   General bed mobility comments: slow labored movement  Transfers                    Ambulation/Gait                 Stairs             Wheelchair Mobility    Modified Rankin (Stroke Patients Only)       Balance Overall balance assessment: Needs assistance Sitting-balance support: Feet supported;No upper extremity supported Sitting balance-Leahy Scale: Poor Sitting balance - Comments: fair/poor seated at EOB, fair when supporting self with BUE Postural control: Posterior lean                                  Cognition Arousal/Alertness: Awake/alert Behavior During Therapy: Flat affect Overall Cognitive Status: History of cognitive impairments - at baseline                                        Exercises General Exercises - Lower Extremity Long Arc Quad: Seated;AAROM;Strengthening;Both;10 reps Heel Slides: Supine;10 reps;Both;AAROM;Strengthening Hip Flexion/Marching: Seated;AAROM;Strengthening;Both;10 reps    General Comments        Pertinent Vitals/Pain Pain Assessment: Faces Faces Pain Scale: Hurts little more Pain Location: pressure movement of BLE Pain Descriptors / Indicators: Grimacing;Guarding;Sore Pain Intervention(s): Limited activity within patient's tolerance;Monitored  during session;Repositioned    Home Living                      Prior Function            PT Goals (current goals can now be found in the care plan section) Acute Rehab PT Goals Patient Stated Goal: return home with assistance PT Goal Formulation: With patient/family Time For Goal Achievement: 07/21/19 Potential to Achieve Goals: Fair Progress towards PT goals: Progressing toward goals    Frequency    Min 3X/week      PT Plan Current plan remains appropriate    Co-evaluation              AM-PAC PT "6 Clicks" Mobility   Outcome Measure  Help needed turning from  your back to your side while in a flat bed without using bedrails?: A Lot Help needed moving from lying on your back to sitting on the side of a flat bed without using bedrails?: A Lot Help needed moving to and from a bed to a chair (including a wheelchair)?: Total Help needed standing up from a chair using your arms (e.g., wheelchair or bedside chair)?: Total Help needed to walk in hospital room?: Total Help needed climbing 3-5 steps with a railing? : Total 6 Click Score: 8    End of Session   Activity Tolerance: Patient tolerated treatment well;Patient limited by fatigue;Patient limited by pain Patient left: in bed;with call bell/phone within reach Nurse Communication: Mobility status PT Visit Diagnosis: Unsteadiness on feet (R26.81);Other abnormalities of gait and mobility (R26.89);Muscle weakness (generalized) (M62.81)     Time: 8185-6314 PT Time Calculation (min) (ACUTE ONLY): 24 min  Charges:  $Therapeutic Exercise: 8-22 mins $Therapeutic Activity: 8-22 mins                     10:48 AM, 07/15/19 Lonell Grandchild, MPT Physical Therapist with [facility] 336 [phone] office [phone] mobile phone

## 2019-07-15 NOTE — Progress Notes (Signed)
Pharmacy Antibiotic Note  Deborah Farrell is a 86 y.o. female admitted on 07/06/2019 with S. Aureus. (mecA +)  bacteremia.  Pharmacy has been consulted for vancomycin dosing.  Plan: Give    vancomycin 1.25g  IV x1 dose, then vancomycin 750mg  IV q24h  Goal vancomycin trough range:  15-20    mcg/mL Pharmacy will continue to monitor renal function, vancomycin troughs as clinically appropriate,  cultures and patient progress.    Height: 5\' 6"  (167.6 cm) Weight: 64.6 kg (142 lb 6.7 oz) IBW/kg (Calculated) : 59.3  Temp (24hrs), Avg:99.6 F (37.6 C), Min:99.2 F (37.3 C), Max:100 F (37.8 C)  Recent Labs  Lab 07/12/19 1250 07/13/19 0613 07/14/19 0616 07/15/19 0448  WBC 14.2* 14.6* 15.2* 15.5*  CREATININE 0.78  --  0.75 0.72    Estimated Creatinine Clearance: 35.9 mL/min (by C-G formula based on SCr of 0.72 mg/dL).     Antimicrobials this admission: ceftriaxone 6/29>>  vancomycin 7/1 >>    Dose adjustments this admission: vancomycin   Microbiology results: 6/29 BC x2: 2 of 4 positive for mecA S. aureus 6/30 Z[facility]:HENI 6/28 Resp PCR: SARS CoV-2 negative;  6/23 MRSA PCR: neg  Thank you for allowing pharmacy to be a part of this patient's care.  Despina Pole 07/15/2019 10:48 AM

## 2019-07-16 ENCOUNTER — Inpatient Hospital Stay (HOSPITAL_COMMUNITY): Payer: Medicare Other

## 2019-07-16 LAB — URINALYSIS, COMPLETE (UACMP) WITH MICROSCOPIC
Bilirubin Urine: NEGATIVE
Glucose, UA: NEGATIVE mg/dL
Ketones, ur: NEGATIVE mg/dL
Leukocytes,Ua: NEGATIVE
Nitrite: NEGATIVE
Protein, ur: 100 mg/dL — AB
Specific Gravity, Urine: 1.019 (ref 1.005–1.030)
pH: 5 (ref 5.0–8.0)

## 2019-07-16 LAB — CULTURE, BLOOD (ROUTINE X 2)
Special Requests: ADEQUATE
Special Requests: ADEQUATE

## 2019-07-16 LAB — CBC
HCT: 28.3 % — ABNORMAL LOW (ref 36.0–46.0)
Hemoglobin: 9.1 g/dL — ABNORMAL LOW (ref 12.0–15.0)
MCH: 31.3 pg (ref 26.0–34.0)
MCHC: 32.2 g/dL (ref 30.0–36.0)
MCV: 97.3 fL (ref 80.0–100.0)
Platelets: 233 10*3/uL (ref 150–400)
RBC: 2.91 MIL/uL — ABNORMAL LOW (ref 3.87–5.11)
RDW: 13 % (ref 11.5–15.5)
WBC: 19.4 10*3/uL — ABNORMAL HIGH (ref 4.0–10.5)
nRBC: 0 % (ref 0.0–0.2)

## 2019-07-16 LAB — BASIC METABOLIC PANEL
Anion gap: 10 (ref 5–15)
BUN: 30 mg/dL — ABNORMAL HIGH (ref 8–23)
CO2: 21 mmol/L — ABNORMAL LOW (ref 22–32)
Calcium: 8.5 mg/dL — ABNORMAL LOW (ref 8.9–10.3)
Chloride: 107 mmol/L (ref 98–111)
Creatinine, Ser: 0.7 mg/dL (ref 0.44–1.00)
GFR calc Af Amer: >60 mL/min (ref >60)
GFR calc non Af Amer: >60 mL/min (ref >60)
Glucose, Bld: 119 mg/dL — ABNORMAL HIGH (ref 70–99)
Potassium: 3.5 mmol/L (ref 3.5–5.1)
Sodium: 138 mmol/L (ref 135–145)

## 2019-07-16 LAB — MAGNESIUM: Magnesium: 2 mg/dL (ref 1.7–2.4)

## 2019-07-16 NOTE — Progress Notes (Signed)
Physical Therapy Treatment Patient Details Name: Deborah Farrell MRN: [id number] DOB: 09-09-1917 Today's Date: 07/16/2019    History of Present Illness Deborah Farrell is a 86 y.o. female with medical history significant for dementia, hypertension, IBS.  Patient was brought to the ED via EMS with reports of altered mental status, patient unable to walk and talk as per her baseline.  Also reports of left facial droop and left-sided weakness.  Last known normal was more than 30 hours ago.At the time of my evaluation, patient is awake alert, able to tell me her name, answer a few simple questions, but she does not know why she is in the hospital.  Per nursing home staff, patient seemed weak and not ambulatory, patient had seem like this since Sunday night.  Patient was evaluated by the supervising doctor today and referred to the ED.Patient tells me she intermittently has pain with urination.  At baseline, patient is incontinent, and ambulatory with a walker.    PT Comments    Patient limited for functional mobility as stated below secondary to BLE weakness, fatigue and poor sitting balance. Patient requires max assist for bed mobility and demonstrates impaired sitting tolerance and balance. Patient limited by pain today. Patient repositioned at end of session. Patient will benefit from continued physical therapy in hospital and recommended venue below to increase strength, balance, endurance for safe ADLs and gait.    Follow Up Recommendations  SNF;Supervision for mobility/OOB;Supervision - Intermittent     Equipment Recommendations  None recommended by PT    Recommendations for Other Services       Precautions / Restrictions Precautions Precautions: Fall Restrictions Weight Bearing Restrictions: No    Mobility  Bed Mobility Overal bed mobility: Needs Assistance Bed Mobility: Supine to Sit;Sit to Supine;Rolling Rolling: Max assist   Supine to sit: Max assist Sit to supine: Max  assist   General bed mobility comments: slow labored movement  Transfers                    Ambulation/Gait                 Stairs             Wheelchair Mobility    Modified Rankin (Stroke Patients Only)       Balance Overall balance assessment: Needs assistance Sitting-balance support: Feet supported;No upper extremity supported Sitting balance-Leahy Scale: Poor Sitting balance - Comments: Poor sitting in bed requires assist to remain seated Postural control: Posterior lean                                  Cognition Arousal/Alertness: Awake/alert Behavior During Therapy: Flat affect Overall Cognitive Status: History of cognitive impairments - at baseline                                        Exercises      General Comments        Pertinent Vitals/Pain Pain Assessment: Faces Faces Pain Scale: Hurts little more Pain Location: Legs Pain Descriptors / Indicators: Grimacing;Guarding;Sore Pain Intervention(s): Limited activity within patient's tolerance;Monitored during session;Repositioned    Home Living                      Prior Function  PT Goals (current goals can now be found in the care plan section) Acute Rehab PT Goals Patient Stated Goal: return home with assistance PT Goal Formulation: With patient/family Time For Goal Achievement: 07/21/19 Potential to Achieve Goals: Fair Progress towards PT goals: Progressing toward goals    Frequency    Min 3X/week      PT Plan Current plan remains appropriate    Co-evaluation              AM-PAC PT "6 Clicks" Mobility   Outcome Measure  Help needed turning from your back to your side while in a flat bed without using bedrails?: A Lot Help needed moving from lying on your back to sitting on the side of a flat bed without using bedrails?: A Lot Help needed moving to and from a bed to a chair (including a wheelchair)?:  Total Help needed standing up from a chair using your arms (e.g., wheelchair or bedside chair)?: Total Help needed to walk in hospital room?: Total Help needed climbing 3-5 steps with a railing? : Total 6 Click Score: 8    End of Session   Activity Tolerance: Patient tolerated treatment well;Patient limited by fatigue;Patient limited by pain Patient left: in bed;with call bell/phone within reach Nurse Communication: Mobility status PT Visit Diagnosis: Unsteadiness on feet (R26.81);Other abnormalities of gait and mobility (R26.89);Muscle weakness (generalized) (M62.81)     Time: 6468-0321 PT Time Calculation (min) (ACUTE ONLY): 8 min  Charges:  $Therapeutic Activity: 8-22 mins                     2:29 PM, 07/16/19 Mearl Latin PT, DPT Physical Therapist at [facility]

## 2019-07-16 NOTE — Progress Notes (Signed)
PROGRESS NOTE  CATALINA SALASAR RSW:[id number] DOB: June 16, 1917 DOA: 07/06/2019 PCP: Celene Squibb, MD Brief History: 86 year old female with a history of dementia, hypertension, IBS presenting with lethargy from [place]. According to the patient's niece, the patient is able to ambulate with a walker and is able to speak and carry on a conversation albeit she is confused at baseline. She is normally able to feed herself but does require assistance with her other activities of daily living. In addition, there was new onset left facial droop and left-sided weakness. Since admission, MRI of the brain was obtained which showed no acute findings. However neurology was consulted, and there was concern for TIA. The patient was started on aspirin and Plavix. Patient continued to be somnolent requiring assistance with feeding. PT evaluated the patient and recommended skilled nursing facility. She remained encephalopathic. As result, oral intake remained poor.  Assessment/Plan: TIA/facial droop/left-sided weakness -Secondary to TIA -MRI brain negative -Patient seen by neurology-->recommended aspirin Plavix x3 months followed by Plavix monotherapy daily -Carotid ultrasound showed bilateral carotid occlusion, right-sided 70% and 50% left side. -Niece did not want any aggressive intervention  CoNS Bacteremia -unclear source -repeat blood culture -start IV vanco  Acute toxic/metabolic encephalopathy -Discontinue clonazepam -Continue empiric ceftriaxone -Personally reviewed chest x-ray--bibasilar opacities -Check ammonia--28 -Check serum K93--818 -Check folic EXHB--71.6 -TSH 9.678 -07/12/2019 UA--no pyuria -EEG--neg for seizure -7/1--more alert--screamed "stop it you son of a bitch"  Leukocytosis -remains hemodynamically stable -CT chest--no infiltrates, no effusions or edema -follow blood and urine culture -WBC rising -CT abd/pelvis -repeat blood  culture -07/16/19--repeat UA--no pyuria  Essential hypertension -Continue ARB  Hyperlipidemia -Continue statin  Anxiety/depression -Holdingclonazepam -continue sertraline  Hypokalemia -replete -check mag 2.1  Goals of Care -Advance care planning, including the explanation and discussion of advance directives was carried out with the patient and family. Code status including explanations of "Full Code" and "DNR" and alternatives were discussed in detail. Discussion of end-of-life issues including but not limited palliative care, hospice care and the concept of hospice, other end-of-life care options, power of attorney for health care decisions, living wills, and physician orders for life-sustaining treatment were also discussed with the patient and family. Total face to face time 16 minutes. -confirmed DNR with niece     Status is: Inpatient  Remains inpatient appropriate because remains encephalopathic on IV abx   Dispo: The patient is from:ALF Anticipated d/c is to:SNF Anticipated d/c date is: 2 days Patient currently is not medically stable to d/c.        Family Communication:Niece updated 7/2  Consultants:neuro  Code Status: DNR  DVT Prophylaxis: [place] Lovenox   Procedures: As Listed in Progress Note Above  Antibiotics: Ceftriaxone 6/29>>> vanco 7/1>>>        Subjective: Patient is awake and alert.  She intermittently answers yes/no.  She is not following commands.  Remainder review of systems unobtainable.  No reports of vomiting, respiratory distress, uncontrolled pain  Objective: Vitals:   07/15/19 1353 07/15/19 2100 07/16/19 0429 07/16/19 1202  BP: (!) 162/66 129/63 (!) 170/79 132/72  Pulse: 94 86 85 84  Resp: (!) 24 16 16 16   Temp: 100.1 F (37.8 C) 98.1 F (36.7 C) 98.8 F (37.1 C) 98.9 F (37.2 C)  TempSrc: Oral Oral Oral Oral  SpO2: 94% 100% 95% 95%   Weight:      Height:        Intake/Output Summary (Last 24 hours) at 07/16/2019 1407 Last  data filed at 07/16/2019 1300 Gross per 24 hour  Intake 445.75 ml  Output 1180 ml  Net -734.25 ml   Weight change:  Exam:   General:  Pt is alert, does not follow commands appropriately, not in acute distress  HEENT: No icterus, No thrush, No neck mass, [place]/AT  Cardiovascular: RRR, S1/S2, no rubs, no gallops  Respiratory: bibasilar rales.  Poor respiratory effort.  Abdomen: Soft/+BS, non tender, non distended, no guarding  Extremities: No edema, No lymphangitis, No petechiae, No rashes, no synovitis   Data Reviewed: I have personally reviewed following labs and imaging studies Basic Metabolic Panel: Recent Labs  Lab 07/12/19 1250 07/14/19 0616 07/14/19 1415 07/15/19 0448 07/16/19 0500  NA 136 136  --  138 138  K 3.6 3.4*  --  3.5 3.5  CL 100 101  --  105 107  CO2 23 23  --  23 21*  GLUCOSE 123* 131*  --  122* 119*  BUN 26* 25*  --  28* 30*  CREATININE 0.78 0.75  --  0.72 0.70  CALCIUM 8.8* 8.7*  --  8.6* 8.5*  MG  --   --  2.1  --  2.0   Liver Function Tests: Recent Labs  Lab 07/14/19 0616  AST 24  ALT 25  ALKPHOS 58  BILITOT 0.7  PROT 6.8  ALBUMIN 3.1*   No results for input(s): LIPASE, AMYLASE in the last 168 hours. Recent Labs  Lab 07/14/19 1415  AMMONIA 28   Coagulation Profile: No results for input(s): INR, PROTIME in the last 168 hours. CBC: Recent Labs  Lab 07/12/19 1250 07/13/19 0613 07/14/19 0616 07/15/19 0448 07/16/19 0500  WBC 14.2* 14.6* 15.2* 15.5* 19.4*  HGB 10.5* 10.7* 10.0* 9.4* 9.1*  HCT 32.6* 33.1* 30.9* 29.6* 28.3*  MCV 98.2 98.5 96.3 97.7 97.3  PLT 257 214 224 249 233   Cardiac Enzymes: No results for input(s): CKTOTAL, CKMB, CKMBINDEX, TROPONINI in the last 168 hours. BNP: Invalid input(s): POCBNP CBG: No results for input(s): GLUCAP in the last 168 hours. HbA1C: No results for input(s): HGBA1C in the last 72 hours. Urine  analysis:    Component Value Date/Time   COLORURINE YELLOW 07/16/2019 1133   APPEARANCEUR CLEAR 07/16/2019 1133   LABSPEC 1.019 07/16/2019 1133   PHURINE 5.0 07/16/2019 1133   GLUCOSEU NEGATIVE 07/16/2019 1133   HGBUR MODERATE (A) 07/16/2019 1133   BILIRUBINUR NEGATIVE 07/16/2019 1133   KETONESUR NEGATIVE 07/16/2019 1133   PROTEINUR 100 (A) 07/16/2019 1133   UROBILINOGEN 1.0 11/02/2011 2208   NITRITE NEGATIVE 07/16/2019 1133   LEUKOCYTESUR NEGATIVE 07/16/2019 1133   Sepsis Labs: @LABRCNTIP (procalcitonin:4,lacticidven:4) ) Recent Results (from the past 240 hour(s))  SARS Coronavirus 2 by RT PCR (hospital order, performed in [place] hospital lab) Nasopharyngeal Nasopharyngeal Swab     Status: None   Collection Time: 07/06/19  5:04 PM   Specimen: Nasopharyngeal Swab  Result Value Ref Range Status   SARS Coronavirus 2 NEGATIVE NEGATIVE Final    Comment: (NOTE) SARS-CoV-2 target nucleic acids are NOT DETECTED.  The SARS-CoV-2 RNA is generally detectable in upper and lower respiratory specimens during the acute phase of infection. The lowest concentration of SARS-CoV-2 viral copies this assay can detect is 250 copies / mL. A negative result does not preclude SARS-CoV-2 infection and should not be used as the sole basis for treatment or other patient management decisions.  A negative result may occur with improper specimen collection / handling, submission of specimen other than nasopharyngeal  swab, presence of viral mutation(s) within the areas targeted by this assay, and inadequate number of viral copies (<250 copies / mL). A negative result must be combined with clinical observations, patient history, and epidemiological information.  Fact Sheet for Patients:   StrictlyIdeas.no  Fact Sheet for Healthcare Providers: BankingDealers.co.za  This test is not yet approved or  cleared by the [place] FDA and has been  authorized for detection and/or diagnosis of SARS-CoV-2 by FDA under an Emergency Use Authorization (EUA).  This EUA will remain in effect (meaning this test can be used) for the duration of the COVID-19 declaration under Section 564(b)(1) of the Act, 21 U.S.C. section 360bbb-3(b)(1), unless the authorization is terminated or revoked sooner.  Performed at [facility], [address], [place], [place] [address]   MRSA PCR Screening     Status: None   Collection Time: 07/07/19  5:25 AM   Specimen: Nasal Mucosa; Nasopharyngeal  Result Value Ref Range Status   MRSA by PCR NEGATIVE NEGATIVE Final    Comment:        The GeneXpert MRSA Assay (FDA approved for NASAL specimens only), is one component of a comprehensive MRSA colonization surveillance program. It is not intended to diagnose MRSA infection nor to guide or monitor treatment for MRSA infections. Performed at [facility], [address], [place], [place] [address]   SARS Coronavirus 2 by RT PCR (hospital order, performed in [facility] hospital lab) Nasopharyngeal Nasopharyngeal Swab     Status: None   Collection Time: 07/12/19 11:56 AM   Specimen: Nasopharyngeal Swab  Result Value Ref Range Status   SARS Coronavirus 2 NEGATIVE NEGATIVE Final    Comment: (NOTE) SARS-CoV-2 target nucleic acids are NOT DETECTED.  The SARS-CoV-2 RNA is generally detectable in upper and lower respiratory specimens during the acute phase of infection. The lowest concentration of SARS-CoV-2 viral copies this assay can detect is 250 copies / mL. A negative result does not preclude SARS-CoV-2 infection and should not be used as the sole basis for treatment or other patient management decisions.  A negative result may occur with improper specimen collection / handling, submission of specimen other than nasopharyngeal swab, presence of viral mutation(s) within the areas targeted by this assay, and inadequate number of viral copies (<250 copies /  mL). A negative result must be combined with clinical observations, patient history, and epidemiological information.  Fact Sheet for Patients:   StrictlyIdeas.no  Fact Sheet for Healthcare Providers: BankingDealers.co.za  This test is not yet approved or  cleared by the [place] FDA and has been authorized for detection and/or diagnosis of SARS-CoV-2 by FDA under an Emergency Use Authorization (EUA).  This EUA will remain in effect (meaning this test can be used) for the duration of the COVID-19 declaration under Section 564(b)(1) of the Act, 21 U.S.C. section 360bbb-3(b)(1), unless the authorization is terminated or revoked sooner.  Performed at [facility], [address], [place], [place] [address]   Culture, blood (routine x 2)     Status: Abnormal   Collection Time: 07/13/19  9:48 PM   Specimen: BLOOD RIGHT HAND  Result Value Ref Range Status   Specimen Description   Final    BLOOD RIGHT HAND Performed at [facility], [address], [place], [place] [address]    Special Requests   Final    BOTTLES DRAWN AEROBIC AND ANAEROBIC Blood Culture adequate volume Performed at [facility], [address], [place], [place] [address]    Culture  Setup Time   Final    GRAM POSITIVE COCCI IN BOTH AEROBIC AND ANAEROBIC BOTTLES Gram Stain Report Called to,Read Back By and Verified With:  TAYLOR,M [id number] 07/14/2019 BY TJONES Performed at [facility], [address], [place], [place] [address]    Culture (A)  Final    STAPHYLOCOCCUS HOMINIS SUSCEPTIBILITIES PERFORMED ON PREVIOUS CULTURE WITHIN THE LAST 5 DAYS. Performed at [facility], [place] [address] [place], [place] [address]    Report Status 07/16/2019 FINAL  Final  Culture, blood (routine x 2)     Status: Abnormal   Collection Time: 07/13/19  9:48 PM   Specimen: BLOOD  Result Value Ref Range Status   Specimen Description   Final    BLOOD BLOOD RIGHT WRIST Performed at  [facility], [address] [place], [place] [address]    Special Requests   Final    BOTTLES DRAWN AEROBIC AND ANAEROBIC Blood Culture adequate volume Performed at [facility], [address], [place], [place] [address]    Culture  Setup Time   Final    GRAM POSITIVE COCCI ANAEROBIC BOTTLE Gram Stain Report Called to,Read Back By and Verified With: Ardis Hughs 07/14/2019 BY TJONES [place] CRITICAL RESULT CALLED TO, READ BACK BY AND VERIFIED WITH: Geni Bers RN 07/14/19 1906 JDW Performed at [facility], [place] [address], [place], [place] [address]    Culture STAPHYLOCOCCUS HOMINIS (A)  Final   Report Status 07/16/2019 FINAL  Final   Organism ID, Bacteria STAPHYLOCOCCUS HOMINIS  Final      Susceptibility   Staphylococcus hominis - MIC*    CIPROFLOXACIN 2 INTERMEDIATE Intermediate     ERYTHROMYCIN >=8 RESISTANT Resistant     GENTAMICIN <=0.5 SENSITIVE Sensitive     OXACILLIN 0.5 RESISTANT Resistant     TETRACYCLINE >=16 RESISTANT Resistant     VANCOMYCIN 1 SENSITIVE Sensitive     TRIMETH/SULFA <=10 SENSITIVE Sensitive     CLINDAMYCIN <=0.25 SENSITIVE Sensitive     RIFAMPIN <=0.5 SENSITIVE Sensitive     Inducible Clindamycin NEGATIVE Sensitive     * STAPHYLOCOCCUS HOMINIS  Blood Culture ID Panel (Reflexed)     Status: Abnormal   Collection Time: 07/13/19  9:48 PM  Result Value Ref Range Status   Enterococcus species NOT DETECTED NOT DETECTED Final   Listeria monocytogenes NOT DETECTED NOT DETECTED Final   Staphylococcus species DETECTED (A) NOT DETECTED Final    Comment: Methicillin (oxacillin) resistant coagulase negative staphylococcus. Possible blood culture contaminant (unless isolated from more than one blood culture draw or clinical case suggests pathogenicity). No antibiotic treatment is indicated for blood  culture contaminants. CRITICAL RESULT CALLED TO, READ BACK BY AND VERIFIED WITH: Geni Bers RN 07/14/19 1906 JDW    Staphylococcus aureus (BCID) NOT DETECTED  NOT DETECTED Final   Methicillin resistance DETECTED (A) NOT DETECTED Final    Comment: CRITICAL RESULT CALLED TO, READ BACK BY AND VERIFIED WITH: Geni Bers RN 07/14/19 1906 JDW    Streptococcus species NOT DETECTED NOT DETECTED Final   Streptococcus agalactiae NOT DETECTED NOT DETECTED Final   Streptococcus pneumoniae NOT DETECTED NOT DETECTED Final   Streptococcus pyogenes NOT DETECTED NOT DETECTED Final   Acinetobacter baumannii NOT DETECTED NOT DETECTED Final   Enterobacteriaceae species NOT DETECTED NOT DETECTED Final   Enterobacter cloacae complex NOT DETECTED NOT DETECTED Final   Escherichia coli NOT DETECTED NOT DETECTED Final   Klebsiella oxytoca NOT DETECTED NOT DETECTED Final   Klebsiella pneumoniae NOT  DETECTED NOT DETECTED Final   Proteus species NOT DETECTED NOT DETECTED Final   Serratia marcescens NOT DETECTED NOT DETECTED Final   Haemophilus influenzae NOT DETECTED NOT DETECTED Final   Neisseria meningitidis NOT DETECTED NOT DETECTED Final   Pseudomonas aeruginosa NOT DETECTED NOT DETECTED Final   Candida albicans NOT DETECTED NOT DETECTED Final   Candida glabrata NOT DETECTED NOT DETECTED Final   Candida krusei NOT DETECTED NOT DETECTED Final   Candida parapsilosis NOT DETECTED NOT DETECTED Final   Candida tropicalis NOT DETECTED NOT DETECTED Final    Comment: Performed at [facility], [place] [address] [place], [place] [address]  Culture, Urine     Status: Abnormal (Preliminary result)   Collection Time: 07/14/19  3:54 AM   Specimen: Urine, Clean Catch  Result Value Ref Range Status   Specimen Description URINE, CLEAN CATCH  Final   Special Requests NONE  Final   Culture (A)  Final    30,000 COLONIES/mL KLEBSIELLA PNEUMONIAE CULTURE REINCUBATED FOR BETTER GROWTH    Report Status PENDING  Incomplete   Organism ID, Bacteria KLEBSIELLA PNEUMONIAE (A)  Final      Susceptibility   Klebsiella pneumoniae - MIC*    AMPICILLIN RESISTANT Resistant     CEFAZOLIN <=4  SENSITIVE Sensitive     CEFTRIAXONE <=0.25 SENSITIVE Sensitive     CIPROFLOXACIN <=0.25 SENSITIVE Sensitive     GENTAMICIN <=1 SENSITIVE Sensitive     IMIPENEM <=0.25 SENSITIVE Sensitive     NITROFURANTOIN 32 SENSITIVE Sensitive     TRIMETH/SULFA <=20 SENSITIVE Sensitive     AMPICILLIN/SULBACTAM 4 SENSITIVE Sensitive     PIP/TAZO Value in next row Sensitive      <=4 SENSITIVEPerformed at [facility], [address] [place], [place] [address]    * 30,000 COLONIES/mL KLEBSIELLA PNEUMONIAE  Culture, blood (Routine X 2) w Reflex to ID Panel     Status: None (Preliminary result)   Collection Time: 07/16/19  5:00 AM   Specimen: BLOOD RIGHT WRIST  Result Value Ref Range Status   Specimen Description   Final    BLOOD RIGHT WRIST BOTTLES DRAWN AEROBIC AND ANAEROBIC   Special Requests Blood Culture adequate volume  Final   Culture   Final    NO GROWTH <12 HOURS Performed at [facility], [address], [place], [place] [address]    Report Status PENDING  Incomplete  Culture, blood (Routine X 2) w Reflex to ID Panel     Status: None (Preliminary result)   Collection Time: 07/16/19  5:05 AM   Specimen: BLOOD RIGHT HAND  Result Value Ref Range Status   Specimen Description   Final    BLOOD RIGHT HAND BOTTLES DRAWN AEROBIC AND ANAEROBIC   Special Requests Blood Culture adequate volume  Final   Culture   Final    NO GROWTH <12 HOURS Performed at [facility], [address], [place], [place] [address]    Report Status PENDING  Incomplete     Scheduled Meds:  aspirin EC  81 mg Oral Daily   atorvastatin  40 mg Oral Daily   clopidogrel  75 mg Oral Daily   enoxaparin (LOVENOX) injection  40 mg Subcutaneous Q24H   irbesartan  37.5 mg Oral Daily   mirabegron ER  25 mg Oral QHS   sertraline  100 mg Oral Daily   Continuous Infusions:  0.9 % NaCl with KCl 20 mEq / L Stopped (07/15/19 2115)   cefTRIAXone (ROCEPHIN)  IV  Stopped (07/15/19 2145)   vancomycin 750 mg (07/16/19  1122)    Procedures/Studies: CT Head Wo Contrast  Result Date: 07/06/2019 CLINICAL DATA:  Cognitive decline. Stroke suspected. Focal neuro deficit. EXAM: CT HEAD WITHOUT CONTRAST TECHNIQUE: Contiguous axial images were obtained from the base of the skull through the vertex without intravenous contrast. COMPARISON:  01/01/2019 FINDINGS: Brain: Oblique imaging plane. There is no evidence for acute hemorrhage, hydrocephalus, mass lesion, or abnormal extra-axial fluid collection. No definite CT evidence for acute infarction. Diffuse loss of parenchymal volume is consistent with atrophy. Patchy low attenuation in the deep hemispheric and periventricular white matter is nonspecific, but likely reflects chronic microvascular ischemic demyelination. Vascular: No hyperdense vessel or unexpected calcification. Skull: No evidence for fracture. No worrisome lytic or sclerotic lesion. Sinuses/Orbits: The visualized paranasal sinuses and mastoid air cells are clear. Visualized portions of the globes and intraorbital fat are unremarkable. Other: None. IMPRESSION: 1. No acute intracranial abnormality. 2. Atrophy with chronic small vessel white matter ischemic disease. Electronically Signed   By: Misty Stanley M.D.   On: 07/06/2019 17:36   CT CHEST WO CONTRAST  Result Date: 07/14/2019 CLINICAL DATA:  Cough with abnormal chest radiograph. Evaluate for pneumonia, pleural effusion or abscess. EXAM: CT CHEST WITHOUT CONTRAST TECHNIQUE: Multidetector CT imaging of the chest was performed following the standard protocol without IV contrast. COMPARISON:  Chest radiograph 07/12/2019 FINDINGS: Cardiovascular: Cardiac enlargement. Aortic atherosclerosis. Three vessel coronary artery atherosclerotic calcifications. Mediastinum/Nodes: Normal appearance of the thyroid gland. The trachea appears patent and is midline. Normal appearance of the esophagus. No enlarged mediastinal, supraclavicular, or axillary lymph nodes. Lungs/Pleura:  Lung window detail is diminished by respiratory motion artifact. No significant pleural effusion. Mild posterior pleural thickening is identified bilaterally which is favored to represent dependent changes. No airspace consolidation, atelectasis or pneumothorax. No findings of a pulmonary abscess. Scar identified within the posterolateral right lower lobe. No suspicious pulmonary nodule or mass identified Upper Abdomen: No acute abnormality within the limited images through the upper abdomen. Musculoskeletal: Spondylosis noted within the thoracic spine. No acute or suspicious osseous abnormality. IMPRESSION: 1. No acute cardiopulmonary abnormalities. No evidence for pulmonary abscess, pleural effusion, or pneumonia. 2. Cardiac enlargement, aortic atherosclerosis and 3 vessel coronary artery atherosclerotic calcifications. Aortic Atherosclerosis (ICD10-I70.0). Electronically Signed   By: Kerby Moors M.D.   On: 07/14/2019 16:34   MR ANGIO HEAD WO CONTRAST  Result Date: 07/07/2019 CLINICAL DATA:  Altered mental status EXAM: MRI HEAD WITHOUT CONTRAST MRA HEAD WITHOUT CONTRAST TECHNIQUE: Multiplanar, multiecho pulse sequences of the brain and surrounding structures were obtained without intravenous contrast. Angiographic images of the head were obtained using MRA technique without contrast. COMPARISON:  None. FINDINGS: MRI HEAD Motion artifact is present. Brain: There is no acute infarction or intracranial hemorrhage. There is no intracranial mass, mass effect, or edema. There is no hydrocephalus or extra-axial fluid collection. Prominence of the ventricles and sulci reflects generalized parenchymal volume loss. Patchy T2 hyperintensity in the supratentorial white matter is nonspecific but may reflect mild to moderate chronic microvascular ischemic changes. Vascular: Major vessel flow voids at the skull base are preserved. Skull and upper cervical spine: Normal marrow signal is preserved. Sinuses/Orbits: Minor  mucosal thickening.  Orbits are unremarkable. Other: Sella is unremarkable.  Mastoid air cells are clear. MRA HEAD Motion artifact is present. Intracranial internal carotid arteries are patent. Proximal middle and anterior cerebral artery segments appear grossly patent but are poorly visualized. Intracranial vertebral arteries, basilar artery, and proximal posterior cerebral arteries are  patent. IMPRESSION: Motion degraded studies. No evidence of acute infarction, hemorrhage, or mass. No proximal intracranial vessel occlusion. Electronically Signed   By: Macy Mis M.D.   On: 07/07/2019 13:00   MR BRAIN WO CONTRAST  Result Date: 07/07/2019 CLINICAL DATA:  Altered mental status EXAM: MRI HEAD WITHOUT CONTRAST MRA HEAD WITHOUT CONTRAST TECHNIQUE: Multiplanar, multiecho pulse sequences of the brain and surrounding structures were obtained without intravenous contrast. Angiographic images of the head were obtained using MRA technique without contrast. COMPARISON:  None. FINDINGS: MRI HEAD Motion artifact is present. Brain: There is no acute infarction or intracranial hemorrhage. There is no intracranial mass, mass effect, or edema. There is no hydrocephalus or extra-axial fluid collection. Prominence of the ventricles and sulci reflects generalized parenchymal volume loss. Patchy T2 hyperintensity in the supratentorial white matter is nonspecific but may reflect mild to moderate chronic microvascular ischemic changes. Vascular: Major vessel flow voids at the skull base are preserved. Skull and upper cervical spine: Normal marrow signal is preserved. Sinuses/Orbits: Minor mucosal thickening.  Orbits are unremarkable. Other: Sella is unremarkable.  Mastoid air cells are clear. MRA HEAD Motion artifact is present. Intracranial internal carotid arteries are patent. Proximal middle and anterior cerebral artery segments appear grossly patent but are poorly visualized. Intracranial vertebral arteries, basilar artery,  and proximal posterior cerebral arteries are patent. IMPRESSION: Motion degraded studies. No evidence of acute infarction, hemorrhage, or mass. No proximal intracranial vessel occlusion. Electronically Signed   By: Macy Mis M.D.   On: 07/07/2019 13:00   US Carotid Bilateral  Result Date: 07/07/2019 CLINICAL DATA:  TIA.  History of hypertension. EXAM: BILATERAL CAROTID DUPLEX ULTRASOUND TECHNIQUE: Pearline Cables scale imaging, color Doppler and duplex ultrasound were performed of bilateral carotid and vertebral arteries in the neck. COMPARISON:  None. FINDINGS: Criteria: Quantification of carotid stenosis is based on velocity parameters that correlate the residual internal carotid diameter with NASCET-based stenosis levels, using the diameter of the distal internal carotid lumen as the denominator for stenosis measurement. The following velocity measurements were obtained: RIGHT ICA: 216/19 cm/sec CCA: 23/5 cm/sec SYSTOLIC ICA/CCA RATIO:  2.2 ECA: 40 cm/sec LEFT ICA: 80/18 cm/sec CCA: 36/14 cm/sec SYSTOLIC ICA/CCA RATIO:  1.2 ECA: 112 cm/sec RIGHT CAROTID ARTERY: There is a large amount of eccentric echogenic plaque within the right carotid bulb (image 21 and 24), extending to involve the origin and proximal aspects of the right internal carotid artery (image 34), which results in elevated peak systolic velocities within the proximal aspect the right internal carotid artery. Greatest acquired peak systolic velocity with the proximal right ICA measures 216 centimeters/second (image 37). RIGHT VERTEBRAL ARTERY:  Antegrade Flow LEFT CAROTID ARTERY: There is a large amount of eccentric echogenic plaque within the left carotid bulb (images 68 and 71), extending to involve the origin and proximal aspects of the left internal carotid artery (image 82), morphologically resulting at least 50% luminal narrowing, though definitely resulting in elevated peak systolic velocities within the interrogated course of the left internal  carotid artery to suggest a hemodynamically significant stenosis. LEFT VERTEBRAL ARTERY:  Antegrade flow IMPRESSION: 1. Large amount of right-sided atherosclerotic plaque results in elevated peak systolic velocities within the right internal carotid artery which approach the greater than 70% luminal narrowing range. Further evaluation with CTA could performed as clinically indicated. 2. Large amount of left-sided atherosclerotic plaque, morphologically results in at least 50% luminal narrowing, though does not result in elevated peak systolic velocities within the interrogated course of the left internal carotid artery  to suggest a hemodynamically significant stenosis. Again, further evaluation with CTA could performed as indicated. 3. Antegrade flow demonstrated within the bilateral vertebral arteries. Electronically Signed   By: Sandi Mariscal M.D.   On: 07/07/2019 16:33   DG CHEST PORT 1 VIEW  Result Date: 07/12/2019 CLINICAL DATA:  Leukocytosis.  Altered mental status. EXAM: PORTABLE CHEST 1 VIEW COMPARISON:  03/10/2018 FINDINGS: Lung volumes are low.The cardiomediastinal contours are normal. Aortic atherosclerosis. Patient's chin partially obscures evaluation of the left lung apex, could not tolerate repositioning. Pulmonary vasculature is normal. No consolidation, pleural effusion, or pneumothorax. No acute osseous abnormalities are seen. IMPRESSION: Low lung volumes without acute abnormality. Aortic Atherosclerosis (ICD10-I70.0). Electronically Signed   By: Keith Rake M.D.   On: 07/12/2019 21:19   EEG adult  Result Date: 07/14/2019 Alexis Goodell, MD     07/14/2019  3:49 PM ELECTROENCEPHALOGRAM REPORT Patient: [name]       Room #: A78 Age: 86 y.o.        Sex: female Requesting Physician: Loanne Emery Report Date:  07/14/2019       Interpreting Physician: Alexis Goodell History: YASIRA ENGELSON is an 86 y.o. female with altered mental status Medications: ASA, Lipitor, Rocephin, Plavix, Avapro,  Myrbetriq, Zoloft Conditions of Recording:  This is a 21 channel routine scalp EEG performed with bipolar and monopolar montages arranged in accordance to the international 10/20 system of electrode placement. One channel was dedicated to EKG recording. The patient is in the drowsy and asleep states. Description:  The patient appears asleep for much of the recording.  The background is slow and poorly organized with superimposed symmetrical sleep spindles and vertex central sharp transients noted.  Towards the end of the recording the patient does open her eyes and although she does not respond the background does activate with a mixture of theta and alpha activity noted.  No epileptiform activity is noted. Hyperventilation and intermittent photic stimulation were not performed. IMPRESSION: This electroencephalogram is consistent with normal drowse and sleep.  No epileptiform activity is noted.  Alexis Goodell, MD Neurology [phone] 07/14/2019, 3:44 PM   ECHOCARDIOGRAM COMPLETE  Result Date: 07/07/2019    ECHOCARDIOGRAM REPORT   Patient Name:   MADDOX BRATCHER Date of Exam: 07/07/2019 Medical Rec #:  [id number]       Height:       66.0 in Accession #:    [id number]      Weight:       142.4 lb Date of Birth:  04-20-1917       BSA:          1.731 m Patient Age:    67 years        BP:           137/59 mmHg Patient Gender: F               HR:           67 bpm. Exam Location:  [facility] Procedure: 2D Echo Indications:    TIA 435.9 / G45.9  History:        Patient has no prior history of Echocardiogram examinations.                 Risk Factors:Hypertension, Dyslipidemia and Non-Smoker.  Sonographer:    [name] RDCS (AE) Referring Phys: [place]  1. Left ventricular ejection fraction, by estimation, is 65 to 70%. The left ventricle has normal function. The left ventricle has no regional  wall motion abnormalities. There is moderate left ventricular hypertrophy. Left ventricular  diastolic parameters are indeterminate.  2. Right ventricular systolic function is normal. The right ventricular size is normal. There is normal pulmonary artery systolic pressure.  3. The mitral valve is normal in structure. No evidence of mitral valve regurgitation. No evidence of mitral stenosis.  4. The aortic valve is tricuspid. Aortic valve regurgitation is not visualized. No aortic stenosis is present.  5. The inferior vena cava is normal in size with greater than 50% respiratory variability, suggesting right atrial pressure of 3 mmHg. FINDINGS  Left Ventricle: Left ventricular ejection fraction, by estimation, is 65 to 70%. The left ventricle has normal function. The left ventricle has no regional wall motion abnormalities. The left ventricular internal cavity size was normal in size. There is  moderate left ventricular hypertrophy. Left ventricular diastolic parameters are indeterminate. Right Ventricle: The right ventricular size is normal. Right vetricular wall thickness was not assessed. Right ventricular systolic function is normal. There is normal pulmonary artery systolic pressure. The tricuspid regurgitant velocity is 1.93 m/s, and with an assumed right atrial pressure of 10 mmHg, the estimated right ventricular systolic pressure is 31.5 mmHg. Left Atrium: Left atrial size was normal in size. Right Atrium: Right atrial size was not well visualized. Pericardium: There is no evidence of pericardial effusion. Mitral Valve: The mitral valve is normal in structure. No evidence of mitral valve regurgitation. No evidence of mitral valve stenosis. Tricuspid Valve: The tricuspid valve is normal in structure. Tricuspid valve regurgitation is trivial. No evidence of tricuspid stenosis. Aortic Valve: The aortic valve is tricuspid. Aortic valve regurgitation is not visualized. No aortic stenosis is present. Aortic valve mean gradient measures 2.6 mmHg. Aortic valve peak gradient measures 4.3 mmHg. Aortic valve  area, by VTI measures 2.94 cm. Pulmonic Valve: The pulmonic valve was not well visualized. Pulmonic valve regurgitation is not visualized. No evidence of pulmonic stenosis. Aorta: The aortic root is normal in size and structure. Pulmonary Artery: Indeterminant PASP, inadequate TR jet. Venous: The inferior vena cava is normal in size with greater than 50% respiratory variability, suggesting right atrial pressure of 3 mmHg. IAS/Shunts: No atrial level shunt detected by color flow Doppler.  LEFT VENTRICLE PLAX 2D LVIDd:         3.54 cm  Diastology LVIDs:         1.88 cm  LV e' lateral:   7.07 cm/s LV PW:         1.26 cm  LV E/e' lateral: 9.7 LV IVS:        1.37 cm  LV e' medial:    5.44 cm/s LVOT diam:     2.00 cm  LV E/e' medial:  12.7 LV SV:         69 LV SV Index:   40 LVOT Area:     3.14 cm  RIGHT VENTRICLE RV S prime:     14.00 cm/s TAPSE (M-mode): 1.8 cm LEFT ATRIUM           Index       RIGHT ATRIUM           Index LA diam:      3.60 cm 2.08 cm/m  RA Area:     12.20 cm LA Vol (A2C): 30.5 ml 17.62 ml/m RA Volume:   29.70 ml  17.16 ml/m LA Vol (A4C): 38.8 ml 22.41 ml/m  AORTIC VALVE AV Area (Vmax):    2.98 cm AV Area (Vmean):  2.64 cm AV Area (VTI):     2.94 cm AV Vmax:           104.25 cm/s AV Vmean:          76.936 cm/s AV VTI:            0.236 m AV Peak Grad:      4.3 mmHg AV Mean Grad:      2.6 mmHg LVOT Vmax:         98.95 cm/s LVOT Vmean:        64.652 cm/s LVOT VTI:          0.221 m LVOT/AV VTI ratio: 0.94  AORTA Ao Root diam: 3.00 cm MITRAL VALVE               TRICUSPID VALVE MV Area (PHT): 3.03 cm    TR Peak grad:   14.9 mmHg MV Decel Time: 250 msec    TR Vmax:        193.00 cm/s MV E velocity: 68.90 cm/s MV A velocity: 57.70 cm/s  SHUNTS MV E/A ratio:  1.19        Systemic VTI:  0.22 m                            Systemic Diam: 2.00 cm Carlyle Dolly MD Electronically signed by Carlyle Dolly MD Signature Date/Time: 07/07/2019/3:58:13 PM    Final     Orson Eva, DO  [facility]  If 7PM-7AM, please contact night-coverage [email or web address] Password [email or web address] 07/16/2019, 2:07 PM   LOS: 7 days  3

## 2019-07-17 LAB — COMPREHENSIVE METABOLIC PANEL
ALT: 36 U/L (ref 0–44)
AST: 36 U/L (ref 15–41)
Albumin: 2.7 g/dL — ABNORMAL LOW (ref 3.5–5.0)
Alkaline Phosphatase: 68 U/L (ref 38–126)
Anion gap: 12 (ref 5–15)
BUN: 29 mg/dL — ABNORMAL HIGH (ref 8–23)
CO2: 21 mmol/L — ABNORMAL LOW (ref 22–32)
Calcium: 8.8 mg/dL — ABNORMAL LOW (ref 8.9–10.3)
Chloride: 106 mmol/L (ref 98–111)
Creatinine, Ser: 0.76 mg/dL (ref 0.44–1.00)
GFR calc Af Amer: >60 mL/min (ref >60)
GFR calc non Af Amer: >60 mL/min (ref >60)
Glucose, Bld: 142 mg/dL — ABNORMAL HIGH (ref 70–99)
Potassium: 3.5 mmol/L (ref 3.5–5.1)
Sodium: 139 mmol/L (ref 135–145)
Total Bilirubin: 0.8 mg/dL (ref 0.3–1.2)
Total Protein: 6.8 g/dL (ref 6.5–8.1)

## 2019-07-17 LAB — CBC
HCT: 32.5 % — ABNORMAL LOW (ref 36.0–46.0)
Hemoglobin: 10.7 g/dL — ABNORMAL LOW (ref 12.0–15.0)
MCH: 31.8 pg (ref 26.0–34.0)
MCHC: 32.9 g/dL (ref 30.0–36.0)
MCV: 96.7 fL (ref 80.0–100.0)
Platelets: 284 10*3/uL (ref 150–400)
RBC: 3.36 MIL/uL — ABNORMAL LOW (ref 3.87–5.11)
RDW: 13.2 % (ref 11.5–15.5)
WBC: 22.9 10*3/uL — ABNORMAL HIGH (ref 4.0–10.5)
nRBC: 0 % (ref 0.0–0.2)

## 2019-07-17 LAB — URINE CULTURE
Culture: 30000 — AB
Culture: NO GROWTH

## 2019-07-17 LAB — URIC ACID: Uric Acid, Serum: 3.7 mg/dL (ref 2.5–7.1)

## 2019-07-17 MED ORDER — CLINDAMYCIN PHOSPHATE 600 MG/50ML IV SOLN
600.0000 mg | Freq: Three times a day (TID) | INTRAVENOUS | Status: DC
  Administered 2019-07-17 – 2019-07-19 (×6): 600 mg via INTRAVENOUS
  Filled 2019-07-17 (×6): qty 50

## 2019-07-17 MED ORDER — POTASSIUM CHLORIDE IN NACL 20-0.9 MEQ/L-% IV SOLN: INTRAVENOUS | Status: DC

## 2019-07-17 MED ORDER — SODIUM CHLORIDE 0.9 % IV SOLN
1.0000 g | INTRAVENOUS | Status: DC
  Administered 2019-07-17 – 2019-07-19 (×3): 1 g via INTRAVENOUS
  Filled 2019-07-17 (×3): qty 10

## 2019-07-17 NOTE — Progress Notes (Signed)
BP 170/68 after 3rd repeat. BP been elevated throughout day. MD made aware. Awaiting new orders.

## 2019-07-17 NOTE — Progress Notes (Signed)
Pt not swallowing food. Per tech patient only ate for family member from previous shift.  Pt noted to keep food in mouth, gagging, and choking instead of swallowing. MD notified

## 2019-07-17 NOTE — Progress Notes (Addendum)
PROGRESS NOTE  Deborah Farrell EUM:[id number] DOB: Dec 11, 1917 DOA: 07/06/2019 PCP: Celene Squibb, MD  Brief History: 86 year old female with a history of dementia, hypertension, IBS presenting with lethargy from [place]. According to the patient's niece, the patient is able to ambulate with a walker and is able to speak and carry on a conversation albeit she is confused at baseline. She is normally able to feed herself but does require assistance with her other activities of daily living. In addition, there was new onset left facial droop and left-sided weakness. Since admission, MRI of the brain was obtained which showed no acute findings. However neurology was consulted, and there was concern for TIA. The patient was started on aspirin and Plavix. Patient continued to be somnolent requiring assistance with feeding. PT evaluated the patient and recommended skilled nursing facility. She remained encephalopathic. As result, oral intake remained poor.  Assessment/Plan: TIA/facial droop/left-sided weakness -Secondary to TIA -MRI brain negative -Patient seen by neurology-->recommended aspirin Plavix x3 months followed by Plavix monotherapy daily -Carotid ultrasound showed bilateral carotid occlusion, right-sided 70% and 50% left side. -Niece did not want any aggressive intervention  CoNS Bacteremia -unclear source -repeat blood culture neg -start IV vanco -07/07/19 Echo--no vegetation;  EF 65-70%  Acute toxic/metabolic encephalopathy -Discontinue clonazepam -Continue empiric ceftriaxone -Personally reviewed chest x-ray--bibasilar opacities -Check ammonia--28 -Check serum V40--086 -Check folic PYPP--50.9 -TSH 3.267 -07/12/2019 UA--no pyuria -EEG--neg for seizure -mental status has gradually declined in last 48 hours  Leukocytosis -remains hemodynamically stable -CT chest--no infiltrates, no effusions or edema -7/2 repeat blood culture neg -7/2 repeat UA-no  pyuria -WBC rising -CT abd/pelvis--neg for acute findings -repeat blood culture -07/16/19--repeat UA--no pyuria -no diarrhea  Essential hypertension -Continue ARB  Hyperlipidemia -Continue statin  Anxiety/depression -Holdingclonazepam -continue sertraline  Hypokalemia -replete -check mag2.1  Goals of Care -Advance care planning, including the explanation and discussion of advance directives was carried out with the patient and family. Code status including explanations of "Full Code" and "DNR" and alternatives were discussed in detail. Discussion of end-of-life issues including but not limited palliative care, hospice care and the concept of hospice, other end-of-life care options, power of attorney for health care decisions, living wills, and physician orders for life-sustaining treatment were also discussed with the patient and family. Total face to face time 16 minutes. -confirmed DNR with niece -07/17/19--had goals of care discussion with niece.  Discussed overall prognosis and unlikelihood of patient returning to pre-morbid level of function.  Discussed continued decline clinically despite optimal care.  Family to discuss options including residential hospice.  Family does not want any invasive procedures but would like another 24-48 hours to observe patient's clinical course.     Status is: Inpatient  Remains inpatient appropriate becauseremains encephalopathic on IV abx   Dispo: The patient is from:ALF Anticipated d/c is to:SNF Anticipated d/c date is:2days Patient currently is not medically stable to d/c.        Family Communication:Niece updated7/3  Consultants:neuro  Code Status: DNR  DVT Prophylaxis:  Lovenox   Procedures: As Listed in Progress Note Above  Antibiotics: Ceftriaxone 6/29>>> vanco 7/1>>>     Total time spent 35 minutes.  Greater than 50% spent face  to face counseling and coordinating care.    Subjective: Pt is awake.  Only intermittently answers yes/no.  She has poor po intake.  No reports of vomiting, diarrhea. Respiratory distress or vomiting.  Objective: Vitals:   07/16/19 1202 07/16/19 2237 07/17/19  0428 07/17/19 0443  BP: 132/72 (!) 187/96 (!) 178/80 (!) 167/82  Pulse: 84 (!) 106 (!) 110 (!) 104  Resp: 16 20 16 20   Temp: 98.9 F (37.2 C) 98 F (36.7 C) (!) 101.1 F (38.4 C) 99.8 F (37.7 C)  TempSrc: Oral Oral Axillary Oral  SpO2: 95% 95% 97% 96%  Weight:      Height:        Intake/Output Summary (Last 24 hours) at 07/17/2019 1216 Last data filed at 07/17/2019 1047 Gross per 24 hour  Intake 150 ml  Output 700 ml  Net -550 ml   Weight change:  Exam:   General:  Pt is alert, follows commands appropriately, not in acute distress  HEENT: No icterus, No thrush, No neck mass, [place]/AT  Cardiovascular: RRR, S1/S2, no rubs, no gallops  Respiratory: bibasilar rales. No wheeze  Abdomen: Soft/+BS, non tender, non distended, no guarding  Extremities: Non pitting edema, No lymphangitis, No petechiae, No rashes, no synovitis   Data Reviewed: I have personally reviewed following labs and imaging studies Basic Metabolic Panel: Recent Labs  Lab 07/12/19 1250 07/14/19 0616 07/14/19 1415 07/15/19 0448 07/16/19 0500 07/17/19 0619  NA 136 136  --  138 138 139  K 3.6 3.4*  --  3.5 3.5 3.5  CL 100 101  --  105 107 106  CO2 23 23  --  23 21* 21*  GLUCOSE 123* 131*  --  122* 119* 142*  BUN 26* 25*  --  28* 30* 29*  CREATININE 0.78 0.75  --  0.72 0.70 0.76  CALCIUM 8.8* 8.7*  --  8.6* 8.5* 8.8*  MG  --   --  2.1  --  2.0  --    Liver Function Tests: Recent Labs  Lab 07/14/19 0616 07/17/19 0619  AST 24 36  ALT 25 36  ALKPHOS 58 68  BILITOT 0.7 0.8  PROT 6.8 6.8  ALBUMIN 3.1* 2.7*   No results for input(s): LIPASE, AMYLASE in the last 168 hours. Recent Labs  Lab 07/14/19 1415  AMMONIA 28   Coagulation  Profile: No results for input(s): INR, PROTIME in the last 168 hours. CBC: Recent Labs  Lab 07/13/19 0613 07/14/19 0616 07/15/19 0448 07/16/19 0500 07/17/19 0619  WBC 14.6* 15.2* 15.5* 19.4* 22.9*  HGB 10.7* 10.0* 9.4* 9.1* 10.7*  HCT 33.1* 30.9* 29.6* 28.3* 32.5*  MCV 98.5 96.3 97.7 97.3 96.7  PLT 214 224 249 233 284   Cardiac Enzymes: No results for input(s): CKTOTAL, CKMB, CKMBINDEX, TROPONINI in the last 168 hours. BNP: Invalid input(s): POCBNP CBG: No results for input(s): GLUCAP in the last 168 hours. HbA1C: No results for input(s): HGBA1C in the last 72 hours. Urine analysis:    Component Value Date/Time   COLORURINE YELLOW 07/16/2019 1133   APPEARANCEUR CLEAR 07/16/2019 1133   LABSPEC 1.019 07/16/2019 1133   PHURINE 5.0 07/16/2019 1133   GLUCOSEU NEGATIVE 07/16/2019 1133   HGBUR MODERATE (A) 07/16/2019 1133   BILIRUBINUR NEGATIVE 07/16/2019 1133   KETONESUR NEGATIVE 07/16/2019 1133   PROTEINUR 100 (A) 07/16/2019 1133   UROBILINOGEN 1.0 11/02/2011 2208   NITRITE NEGATIVE 07/16/2019 1133   LEUKOCYTESUR NEGATIVE 07/16/2019 1133   Sepsis Labs: @LABRCNTIP (procalcitonin:4,lacticidven:4) ) Recent Results (from the past 240 hour(s))  SARS Coronavirus 2 by RT PCR (hospital order, performed in [facility] hospital lab) Nasopharyngeal Nasopharyngeal Swab     Status: None   Collection Time: 07/12/19 11:56 AM   Specimen: Nasopharyngeal Swab  Result Value Ref  Range Status   SARS Coronavirus 2 NEGATIVE NEGATIVE Final    Comment: (NOTE) SARS-CoV-2 target nucleic acids are NOT DETECTED.  The SARS-CoV-2 RNA is generally detectable in upper and lower respiratory specimens during the acute phase of infection. The lowest concentration of SARS-CoV-2 viral copies this assay can detect is 250 copies / mL. A negative result does not preclude SARS-CoV-2 infection and should not be used as the sole basis for treatment or other patient management decisions.  A negative result may  occur with improper specimen collection / handling, submission of specimen other than nasopharyngeal swab, presence of viral mutation(s) within the areas targeted by this assay, and inadequate number of viral copies (<250 copies / mL). A negative result must be combined with clinical observations, patient history, and epidemiological information.  Fact Sheet for Patients:   StrictlyIdeas.no  Fact Sheet for Healthcare Providers: BankingDealers.co.za  This test is not yet approved or  cleared by the [place] FDA and has been authorized for detection and/or diagnosis of SARS-CoV-2 by FDA under an Emergency Use Authorization (EUA).  This EUA will remain in effect (meaning this test can be used) for the duration of the COVID-19 declaration under Section 564(b)(1) of the Act, 21 U.S.C. section 360bbb-3(b)(1), unless the authorization is terminated or revoked sooner.  Performed at [facility], [address], [place], [place] [address]   Culture, blood (routine x 2)     Status: Abnormal   Collection Time: 07/13/19  9:48 PM   Specimen: BLOOD RIGHT HAND  Result Value Ref Range Status   Specimen Description   Final    BLOOD RIGHT HAND Performed at [facility], [address] [place], [place] [address]    Special Requests   Final    BOTTLES DRAWN AEROBIC AND ANAEROBIC Blood Culture adequate volume Performed at [facility], [address] [place], [place] [address]    Culture  Setup Time   Final    GRAM POSITIVE COCCI IN BOTH AEROBIC AND ANAEROBIC BOTTLES Gram Stain Report Called to,Read Back By and Verified With:  TAYLOR,M [id number] 07/14/2019 BY TJONES Performed at [facility], [address] [place], [place] [address]    Culture (A)  Final    STAPHYLOCOCCUS HOMINIS SUSCEPTIBILITIES PERFORMED ON PREVIOUS CULTURE WITHIN THE LAST 5 DAYS. Performed at [facility], [place] [address] [place], [place] [address]    Report Status  07/16/2019 FINAL  Final  Culture, blood (routine x 2)     Status: Abnormal   Collection Time: 07/13/19  9:48 PM   Specimen: BLOOD  Result Value Ref Range Status   Specimen Description   Final    BLOOD BLOOD RIGHT WRIST Performed at [facility], [address], [place], [place] [address]    Special Requests   Final    BOTTLES DRAWN AEROBIC AND ANAEROBIC Blood Culture adequate volume Performed at [facility], [address], [place], [place] [address]    Culture  Setup Time   Final    GRAM POSITIVE COCCI ANAEROBIC BOTTLE Gram Stain Report Called to,Read Back By and Verified With: Ardis Hughs 07/14/2019 BY TJONES Stark CRITICAL RESULT CALLED TO, READ BACK BY AND VERIFIED WITH: Geni Bers RN 07/14/19 1906 JDW Performed at [facility], [place] [address] [place], [place] [address]    Culture STAPHYLOCOCCUS HOMINIS (A)  Final   Report Status 07/16/2019 FINAL  Final   Organism ID, Bacteria STAPHYLOCOCCUS HOMINIS  Final      Susceptibility  Staphylococcus hominis - MIC*    CIPROFLOXACIN 2 INTERMEDIATE Intermediate     ERYTHROMYCIN >=8 RESISTANT Resistant     GENTAMICIN <=0.5 SENSITIVE Sensitive     OXACILLIN 0.5 RESISTANT Resistant     TETRACYCLINE >=16 RESISTANT Resistant     VANCOMYCIN 1 SENSITIVE Sensitive     TRIMETH/SULFA <=10 SENSITIVE Sensitive     CLINDAMYCIN <=0.25 SENSITIVE Sensitive     RIFAMPIN <=0.5 SENSITIVE Sensitive     Inducible Clindamycin NEGATIVE Sensitive     * STAPHYLOCOCCUS HOMINIS  Blood Culture ID Panel (Reflexed)     Status: Abnormal   Collection Time: 07/13/19  9:48 PM  Result Value Ref Range Status   Enterococcus species NOT DETECTED NOT DETECTED Final   Listeria monocytogenes NOT DETECTED NOT DETECTED Final   Staphylococcus species DETECTED (A) NOT DETECTED Final    Comment: Methicillin (oxacillin) resistant coagulase negative staphylococcus. Possible blood culture contaminant (unless isolated from more than one blood culture draw or clinical  case suggests pathogenicity). No antibiotic treatment is indicated for blood  culture contaminants. CRITICAL RESULT CALLED TO, READ BACK BY AND VERIFIED WITH: Geni Bers RN 07/14/19 1906 JDW    Staphylococcus aureus (BCID) NOT DETECTED NOT DETECTED Final   Methicillin resistance DETECTED (A) NOT DETECTED Final    Comment: CRITICAL RESULT CALLED TO, READ BACK BY AND VERIFIED WITH: Geni Bers RN 07/14/19 1906 JDW    Streptococcus species NOT DETECTED NOT DETECTED Final   Streptococcus agalactiae NOT DETECTED NOT DETECTED Final   Streptococcus pneumoniae NOT DETECTED NOT DETECTED Final   Streptococcus pyogenes NOT DETECTED NOT DETECTED Final   Acinetobacter baumannii NOT DETECTED NOT DETECTED Final   Enterobacteriaceae species NOT DETECTED NOT DETECTED Final   Enterobacter cloacae complex NOT DETECTED NOT DETECTED Final   Escherichia coli NOT DETECTED NOT DETECTED Final   Klebsiella oxytoca NOT DETECTED NOT DETECTED Final   Klebsiella pneumoniae NOT DETECTED NOT DETECTED Final   Proteus species NOT DETECTED NOT DETECTED Final   Serratia marcescens NOT DETECTED NOT DETECTED Final   Haemophilus influenzae NOT DETECTED NOT DETECTED Final   Neisseria meningitidis NOT DETECTED NOT DETECTED Final   Pseudomonas aeruginosa NOT DETECTED NOT DETECTED Final   Candida albicans NOT DETECTED NOT DETECTED Final   Candida glabrata NOT DETECTED NOT DETECTED Final   Candida krusei NOT DETECTED NOT DETECTED Final   Candida parapsilosis NOT DETECTED NOT DETECTED Final   Candida tropicalis NOT DETECTED NOT DETECTED Final    Comment: Performed at [facility], [place]. [address] [place], [place] [address]  Culture, Urine     Status: Abnormal   Collection Time: 07/14/19  3:54 AM   Specimen: Urine, Clean Catch  Result Value Ref Range Status   Specimen Description   Final    URINE, CLEAN CATCH Performed at [facility], [address] [place], [place] [address]    Special Requests   Final    NONE Performed  at [facility], [address], [place], [place] [address]    Culture 30,000 COLONIES/mL KLEBSIELLA PNEUMONIAE (A)  Final   Report Status 07/17/2019 FINAL  Final   Organism ID, Bacteria KLEBSIELLA PNEUMONIAE (A)  Final      Susceptibility   Klebsiella pneumoniae - MIC*    AMPICILLIN RESISTANT Resistant     CEFAZOLIN <=4 SENSITIVE Sensitive     CEFTRIAXONE <=0.25 SENSITIVE Sensitive     CIPROFLOXACIN <=0.25 SENSITIVE Sensitive     GENTAMICIN <=1 SENSITIVE Sensitive     IMIPENEM <=0.25 SENSITIVE  Sensitive     NITROFURANTOIN 32 SENSITIVE Sensitive     TRIMETH/SULFA <=20 SENSITIVE Sensitive     AMPICILLIN/SULBACTAM 4 SENSITIVE Sensitive     PIP/TAZO <=4 SENSITIVE Sensitive     * 30,000 COLONIES/mL KLEBSIELLA PNEUMONIAE  Culture, blood (Routine X 2) w Reflex to ID Panel     Status: None (Preliminary result)   Collection Time: 07/16/19  5:00 AM   Specimen: BLOOD RIGHT WRIST  Result Value Ref Range Status   Specimen Description   Final    BLOOD RIGHT WRIST BOTTLES DRAWN AEROBIC AND ANAEROBIC   Special Requests Blood Culture adequate volume  Final   Culture   Final    NO GROWTH 1 DAY Performed at [facility], [address], [place], [place] [address]    Report Status PENDING  Incomplete  Culture, blood (Routine X 2) w Reflex to ID Panel     Status: None (Preliminary result)   Collection Time: 07/16/19  5:05 AM   Specimen: BLOOD RIGHT HAND  Result Value Ref Range Status   Specimen Description   Final    BLOOD RIGHT HAND BOTTLES DRAWN AEROBIC AND ANAEROBIC   Special Requests Blood Culture adequate volume  Final   Culture   Final    NO GROWTH 1 DAY Performed at [facility], [address] [place],  [address]    Report Status PENDING  Incomplete     Scheduled Meds: . aspirin EC  81 mg Oral Daily  . atorvastatin  40 mg Oral Daily  . clopidogrel  75 mg Oral Daily  . enoxaparin (LOVENOX) injection  40 mg Subcutaneous Q24H  . irbesartan  37.5 mg Oral Daily  . mirabegron ER   25 mg Oral QHS  . sertraline  100 mg Oral Daily   Continuous Infusions: . cefTRIAXone (ROCEPHIN)  IV 1 g (07/16/19 2228)  . vancomycin Stopped (07/16/19 1222)    Procedures/Studies: CT ABDOMEN PELVIS WO CONTRAST  Result Date: 07/16/2019 CLINICAL DATA:  Abdominal pain, leukocytosis, altered mental status EXAM: CT ABDOMEN AND PELVIS WITHOUT CONTRAST TECHNIQUE: Multidetector CT imaging of the abdomen and pelvis was performed following the standard protocol without IV contrast. COMPARISON:  02/05/2018 FINDINGS: Lower chest: Bandlike scarring and or atelectasis of the included bilateral lung bases. Three-vessel coronary artery calcifications and stents. Hepatobiliary: No focal liver abnormality is seen. Status post cholecystectomy. No biliary dilatation. Pancreas: Unremarkable. No pancreatic ductal dilatation or surrounding inflammatory changes. Spleen: Normal in size without significant abnormality. Adrenals/Urinary Tract: Adrenal glands are unremarkable. Kidneys are normal, without renal calculi, solid lesion, or hydronephrosis. Bladder is unremarkable. Stomach/Bowel: Stomach is within normal limits. Appendix is surgically absent no evidence of bowel wall thickening, distention, or inflammatory changes. Occasional sigmoid diverticula. Vascular/Lymphatic: Aortic atherosclerosis. No enlarged abdominal or pelvic lymph nodes. Reproductive: Status post hysterectomy. Other: No abdominal wall hernia or abnormality. No abdominopelvic ascites. Musculoskeletal: No acute or significant osseous findings. IMPRESSION: 1. No non-contrast CT findings of the abdomen or pelvis to explain abdominal pain or leukocytosis. 2. Status post cholecystectomy, appendectomy, and hysterectomy. 3. Coronary artery disease.  Aortic Atherosclerosis (ICD10-I70.0). Electronically Signed   By: Eddie Candle M.D.   On: 07/16/2019 20:59   CT Head Wo Contrast  Result Date: 07/06/2019 CLINICAL DATA:  Cognitive decline. Stroke suspected. Focal  neuro deficit. EXAM: CT HEAD WITHOUT CONTRAST TECHNIQUE: Contiguous axial images were obtained from the base of the skull through the vertex without intravenous contrast. COMPARISON:  01/01/2019 FINDINGS: Brain: Oblique imaging plane. There is no evidence  for acute hemorrhage, hydrocephalus, mass lesion, or abnormal extra-axial fluid collection. No definite CT evidence for acute infarction. Diffuse loss of parenchymal volume is consistent with atrophy. Patchy low attenuation in the deep hemispheric and periventricular white matter is nonspecific, but likely reflects chronic microvascular ischemic demyelination. Vascular: No hyperdense vessel or unexpected calcification. Skull: No evidence for fracture. No worrisome lytic or sclerotic lesion. Sinuses/Orbits: The visualized paranasal sinuses and mastoid air cells are clear. Visualized portions of the globes and intraorbital fat are unremarkable. Other: None. IMPRESSION: 1. No acute intracranial abnormality. 2. Atrophy with chronic small vessel white matter ischemic disease. Electronically Signed   By: Misty Stanley M.D.   On: 07/06/2019 17:36   CT CHEST WO CONTRAST  Result Date: 07/14/2019 CLINICAL DATA:  Cough with abnormal chest radiograph. Evaluate for pneumonia, pleural effusion or abscess. EXAM: CT CHEST WITHOUT CONTRAST TECHNIQUE: Multidetector CT imaging of the chest was performed following the standard protocol without IV contrast. COMPARISON:  Chest radiograph 07/12/2019 FINDINGS: Cardiovascular: Cardiac enlargement. Aortic atherosclerosis. Three vessel coronary artery atherosclerotic calcifications. Mediastinum/Nodes: Normal appearance of the thyroid gland. The trachea appears patent and is midline. Normal appearance of the esophagus. No enlarged mediastinal, supraclavicular, or axillary lymph nodes. Lungs/Pleura: Lung window detail is diminished by respiratory motion artifact. No significant pleural effusion. Mild posterior pleural thickening is  identified bilaterally which is favored to represent dependent changes. No airspace consolidation, atelectasis or pneumothorax. No findings of a pulmonary abscess. Scar identified within the posterolateral right lower lobe. No suspicious pulmonary nodule or mass identified Upper Abdomen: No acute abnormality within the limited images through the upper abdomen. Musculoskeletal: Spondylosis noted within the thoracic spine. No acute or suspicious osseous abnormality. IMPRESSION: 1. No acute cardiopulmonary abnormalities. No evidence for pulmonary abscess, pleural effusion, or pneumonia. 2. Cardiac enlargement, aortic atherosclerosis and 3 vessel coronary artery atherosclerotic calcifications. Aortic Atherosclerosis (ICD10-I70.0). Electronically Signed   By: Kerby Moors M.D.   On: 07/14/2019 16:34   MR ANGIO HEAD WO CONTRAST  Result Date: 07/07/2019 CLINICAL DATA:  Altered mental status EXAM: MRI HEAD WITHOUT CONTRAST MRA HEAD WITHOUT CONTRAST TECHNIQUE: Multiplanar, multiecho pulse sequences of the brain and surrounding structures were obtained without intravenous contrast. Angiographic images of the head were obtained using MRA technique without contrast. COMPARISON:  None. FINDINGS: MRI HEAD Motion artifact is present. Brain: There is no acute infarction or intracranial hemorrhage. There is no intracranial mass, mass effect, or edema. There is no hydrocephalus or extra-axial fluid collection. Prominence of the ventricles and sulci reflects generalized parenchymal volume loss. Patchy T2 hyperintensity in the supratentorial white matter is nonspecific but may reflect mild to moderate chronic microvascular ischemic changes. Vascular: Major vessel flow voids at the skull base are preserved. Skull and upper cervical spine: Normal marrow signal is preserved. Sinuses/Orbits: Minor mucosal thickening.  Orbits are unremarkable. Other: Sella is unremarkable.  Mastoid air cells are clear. MRA HEAD Motion artifact is  present. Intracranial internal carotid arteries are patent. Proximal middle and anterior cerebral artery segments appear grossly patent but are poorly visualized. Intracranial vertebral arteries, basilar artery, and proximal posterior cerebral arteries are patent. IMPRESSION: Motion degraded studies. No evidence of acute infarction, hemorrhage, or mass. No proximal intracranial vessel occlusion. Electronically Signed   By: Macy Mis M.D.   On: 07/07/2019 13:00   MR BRAIN WO CONTRAST  Result Date: 07/07/2019 CLINICAL DATA:  Altered mental status EXAM: MRI HEAD WITHOUT CONTRAST MRA HEAD WITHOUT CONTRAST TECHNIQUE: Multiplanar, multiecho pulse sequences of the brain and surrounding structures  were obtained without intravenous contrast. Angiographic images of the head were obtained using MRA technique without contrast. COMPARISON:  None. FINDINGS: MRI HEAD Motion artifact is present. Brain: There is no acute infarction or intracranial hemorrhage. There is no intracranial mass, mass effect, or edema. There is no hydrocephalus or extra-axial fluid collection. Prominence of the ventricles and sulci reflects generalized parenchymal volume loss. Patchy T2 hyperintensity in the supratentorial white matter is nonspecific but may reflect mild to moderate chronic microvascular ischemic changes. Vascular: Major vessel flow voids at the skull base are preserved. Skull and upper cervical spine: Normal marrow signal is preserved. Sinuses/Orbits: Minor mucosal thickening.  Orbits are unremarkable. Other: Sella is unremarkable.  Mastoid air cells are clear. MRA HEAD Motion artifact is present. Intracranial internal carotid arteries are patent. Proximal middle and anterior cerebral artery segments appear grossly patent but are poorly visualized. Intracranial vertebral arteries, basilar artery, and proximal posterior cerebral arteries are patent. IMPRESSION: Motion degraded studies. No evidence of acute infarction, hemorrhage,  or mass. No proximal intracranial vessel occlusion. Electronically Signed   By: Macy Mis M.D.   On: 07/07/2019 13:00   US Carotid Bilateral  Result Date: 07/07/2019 CLINICAL DATA:  TIA.  History of hypertension. EXAM: BILATERAL CAROTID DUPLEX ULTRASOUND TECHNIQUE: Pearline Cables scale imaging, color Doppler and duplex ultrasound were performed of bilateral carotid and vertebral arteries in the neck. COMPARISON:  None. FINDINGS: Criteria: Quantification of carotid stenosis is based on velocity parameters that correlate the residual internal carotid diameter with NASCET-based stenosis levels, using the diameter of the distal internal carotid lumen as the denominator for stenosis measurement. The following velocity measurements were obtained: RIGHT ICA: 216/19 cm/sec CCA: 36/1 cm/sec SYSTOLIC ICA/CCA RATIO:  2.2 ECA: 40 cm/sec LEFT ICA: 80/18 cm/sec CCA: 44/31 cm/sec SYSTOLIC ICA/CCA RATIO:  1.2 ECA: 112 cm/sec RIGHT CAROTID ARTERY: There is a large amount of eccentric echogenic plaque within the right carotid bulb (image 21 and 24), extending to involve the origin and proximal aspects of the right internal carotid artery (image 34), which results in elevated peak systolic velocities within the proximal aspect the right internal carotid artery. Greatest acquired peak systolic velocity with the proximal right ICA measures 216 centimeters/second (image 37). RIGHT VERTEBRAL ARTERY:  Antegrade Flow LEFT CAROTID ARTERY: There is a large amount of eccentric echogenic plaque within the left carotid bulb (images 68 and 71), extending to involve the origin and proximal aspects of the left internal carotid artery (image 82), morphologically resulting at least 50% luminal narrowing, though definitely resulting in elevated peak systolic velocities within the interrogated course of the left internal carotid artery to suggest a hemodynamically significant stenosis. LEFT VERTEBRAL ARTERY:  Antegrade flow IMPRESSION: 1. Large amount  of right-sided atherosclerotic plaque results in elevated peak systolic velocities within the right internal carotid artery which approach the greater than 70% luminal narrowing range. Further evaluation with CTA could performed as clinically indicated. 2. Large amount of left-sided atherosclerotic plaque, morphologically results in at least 50% luminal narrowing, though does not result in elevated peak systolic velocities within the interrogated course of the left internal carotid artery to suggest a hemodynamically significant stenosis. Again, further evaluation with CTA could performed as indicated. 3. Antegrade flow demonstrated within the bilateral vertebral arteries. Electronically Signed   By: Sandi Mariscal M.D.   On: 07/07/2019 16:33   DG CHEST PORT 1 VIEW  Result Date: 07/12/2019 CLINICAL DATA:  Leukocytosis.  Altered mental status. EXAM: PORTABLE CHEST 1 VIEW COMPARISON:  03/10/2018 FINDINGS: Lung volumes  are low.The cardiomediastinal contours are normal. Aortic atherosclerosis. Patient's chin partially obscures evaluation of the left lung apex, could not tolerate repositioning. Pulmonary vasculature is normal. No consolidation, pleural effusion, or pneumothorax. No acute osseous abnormalities are seen. IMPRESSION: Low lung volumes without acute abnormality. Aortic Atherosclerosis (ICD10-I70.0). Electronically Signed   By: Keith Rake M.D.   On: 07/12/2019 21:19   EEG adult  Result Date: 07/14/2019 Alexis Goodell, MD     07/14/2019  3:49 PM ELECTROENCEPHALOGRAM REPORT Patient: [name]       Room #: A48 Age: 86 y.o.        Sex: female Requesting Physician: Pura Picinich Report Date:  07/14/2019       Interpreting Physician: Alexis Goodell History: LASONDRA HODGKINS is an 86 y.o. female with altered mental status Medications: ASA, Lipitor, Rocephin, Plavix, Avapro, Myrbetriq, Zoloft Conditions of Recording:  This is a 21 channel routine scalp EEG performed with bipolar and monopolar montages  arranged in accordance to the international 10/20 system of electrode placement. One channel was dedicated to EKG recording. The patient is in the drowsy and asleep states. Description:  The patient appears asleep for much of the recording.  The background is slow and poorly organized with superimposed symmetrical sleep spindles and vertex central sharp transients noted.  Towards the end of the recording the patient does open her eyes and although she does not respond the background does activate with a mixture of theta and alpha activity noted.  No epileptiform activity is noted. Hyperventilation and intermittent photic stimulation were not performed. IMPRESSION: This electroencephalogram is consistent with normal drowse and sleep.  No epileptiform activity is noted.  Alexis Goodell, MD Neurology [phone] 07/14/2019, 3:44 PM   ECHOCARDIOGRAM COMPLETE  Result Date: 07/07/2019    ECHOCARDIOGRAM REPORT   Patient Name:   EVELEAN BIGLER Date of Exam: 07/07/2019 Medical Rec #:  [id number]       Height:       66.0 in Accession #:    [id number]      Weight:       142.4 lb Date of Birth:  08-10-1917       BSA:          1.731 m Patient Age:    9 years        BP:           137/59 mmHg Patient Gender: F               HR:           67 bpm. Exam Location:  [facility] Procedure: 2D Echo Indications:    TIA 435.9 / G45.9  History:        Patient has no prior history of Echocardiogram examinations.                 Risk Factors:Hypertension, Dyslipidemia and Non-Smoker.  Sonographer:    [name] RDCS (AE) Referring Phys: [place]  1. Left ventricular ejection fraction, by estimation, is 65 to 70%. The left ventricle has normal function. The left ventricle has no regional wall motion abnormalities. There is moderate left ventricular hypertrophy. Left ventricular diastolic parameters are indeterminate.  2. Right ventricular systolic function is normal. The right ventricular size is normal. There  is normal pulmonary artery systolic pressure.  3. The mitral valve is normal in structure. No evidence of mitral valve regurgitation. No evidence of mitral stenosis.  4. The aortic valve is tricuspid. Aortic valve regurgitation is  not visualized. No aortic stenosis is present.  5. The inferior vena cava is normal in size with greater than 50% respiratory variability, suggesting right atrial pressure of 3 mmHg. FINDINGS  Left Ventricle: Left ventricular ejection fraction, by estimation, is 65 to 70%. The left ventricle has normal function. The left ventricle has no regional wall motion abnormalities. The left ventricular internal cavity size was normal in size. There is  moderate left ventricular hypertrophy. Left ventricular diastolic parameters are indeterminate. Right Ventricle: The right ventricular size is normal. Right vetricular wall thickness was not assessed. Right ventricular systolic function is normal. There is normal pulmonary artery systolic pressure. The tricuspid regurgitant velocity is 1.93 m/s, and with an assumed right atrial pressure of 10 mmHg, the estimated right ventricular systolic pressure is 38.7 mmHg. Left Atrium: Left atrial size was normal in size. Right Atrium: Right atrial size was not well visualized. Pericardium: There is no evidence of pericardial effusion. Mitral Valve: The mitral valve is normal in structure. No evidence of mitral valve regurgitation. No evidence of mitral valve stenosis. Tricuspid Valve: The tricuspid valve is normal in structure. Tricuspid valve regurgitation is trivial. No evidence of tricuspid stenosis. Aortic Valve: The aortic valve is tricuspid. Aortic valve regurgitation is not visualized. No aortic stenosis is present. Aortic valve mean gradient measures 2.6 mmHg. Aortic valve peak gradient measures 4.3 mmHg. Aortic valve area, by VTI measures 2.94 cm. Pulmonic Valve: The pulmonic valve was not well visualized. Pulmonic valve regurgitation is not  visualized. No evidence of pulmonic stenosis. Aorta: The aortic root is normal in size and structure. Pulmonary Artery: Indeterminant PASP, inadequate TR jet. Venous: The inferior vena cava is normal in size with greater than 50% respiratory variability, suggesting right atrial pressure of 3 mmHg. IAS/Shunts: No atrial level shunt detected by color flow Doppler.  LEFT VENTRICLE PLAX 2D LVIDd:         3.54 cm  Diastology LVIDs:         1.88 cm  LV e' lateral:   7.07 cm/s LV PW:         1.26 cm  LV E/e' lateral: 9.7 LV IVS:        1.37 cm  LV e' medial:    5.44 cm/s LVOT diam:     2.00 cm  LV E/e' medial:  12.7 LV SV:         69 LV SV Index:   40 LVOT Area:     3.14 cm  RIGHT VENTRICLE RV S prime:     14.00 cm/s TAPSE (M-mode): 1.8 cm LEFT ATRIUM           Index       RIGHT ATRIUM           Index LA diam:      3.60 cm 2.08 cm/m  RA Area:     12.20 cm LA Vol (A2C): 30.5 ml 17.62 ml/m RA Volume:   29.70 ml  17.16 ml/m LA Vol (A4C): 38.8 ml 22.41 ml/m  AORTIC VALVE AV Area (Vmax):    2.98 cm AV Area (Vmean):   2.64 cm AV Area (VTI):     2.94 cm AV Vmax:           104.25 cm/s AV Vmean:          76.936 cm/s AV VTI:            0.236 m AV Peak Grad:      4.3 mmHg AV Mean Grad:  2.6 mmHg LVOT Vmax:         98.95 cm/s LVOT Vmean:        64.652 cm/s LVOT VTI:          0.221 m LVOT/AV VTI ratio: 0.94  AORTA Ao Root diam: 3.00 cm MITRAL VALVE               TRICUSPID VALVE MV Area (PHT): 3.03 cm    TR Peak grad:   14.9 mmHg MV Decel Time: 250 msec    TR Vmax:        193.00 cm/s MV E velocity: 68.90 cm/s MV A velocity: 57.70 cm/s  SHUNTS MV E/A ratio:  1.19        Systemic VTI:  0.22 m                            Systemic Diam: 2.00 cm Carlyle Dolly MD Electronically signed by Carlyle Dolly MD Signature Date/Time: 07/07/2019/3:58:13 PM    Final     Orson Eva, DO  [facility]  If 7PM-7AM, please contact night-coverage [email or web address] Password [email or web address] 07/17/2019, 12:16 PM   LOS: 8 days

## 2019-07-18 ENCOUNTER — Inpatient Hospital Stay (HOSPITAL_COMMUNITY): Payer: Medicare Other

## 2019-07-18 LAB — CBC
HCT: 30.2 % — ABNORMAL LOW (ref 36.0–46.0)
Hemoglobin: 9.7 g/dL — ABNORMAL LOW (ref 12.0–15.0)
MCH: 31.2 pg (ref 26.0–34.0)
MCHC: 32.1 g/dL (ref 30.0–36.0)
MCV: 97.1 fL (ref 80.0–100.0)
Platelets: 292 10*3/uL (ref 150–400)
RBC: 3.11 MIL/uL — ABNORMAL LOW (ref 3.87–5.11)
RDW: 13.2 % (ref 11.5–15.5)
WBC: 25.1 10*3/uL — ABNORMAL HIGH (ref 4.0–10.5)
nRBC: 0 % (ref 0.0–0.2)

## 2019-07-18 LAB — PROCALCITONIN: Procalcitonin: 0.14 ng/mL

## 2019-07-18 LAB — LACTIC ACID, PLASMA: Lactic Acid, Venous: 1 mmol/L (ref 0.5–1.9)

## 2019-07-18 MED ORDER — ASPIRIN 300 MG RE SUPP
300.0000 mg | Freq: Once | RECTAL | Status: AC
  Administered 2019-07-18: 300 mg via RECTAL
  Filled 2019-07-18: qty 1

## 2019-07-18 NOTE — Progress Notes (Signed)
PROGRESS NOTE  Deborah Farrell CHY:[id number] DOB: [birth year] DOA: 07/06/2019 PCP: Celene Squibb, MD  Brief History: 86 year old female with a history of dementia, hypertension, IBS presenting with lethargy from [place]. According to the patient's niece, the patient is able to ambulate with a walker and is able to speak and carry on a conversation albeit she is confused at baseline. She is normally able to feed herself but does require assistance with her other activities of daily living. In addition, there was new onset left facial droop and left-sided weakness. Since admission, MRI of the brain was obtained which showed no acute findings. However neurology was consulted, and there was concern for TIA. The patient was started on aspirin and Plavix. Patient continued to be somnolent requiring assistance with feeding. PT evaluated the patient and recommended skilled nursing facility. She remained encephalopathic. As result, oral intake remained poor. Blood cultures showed Staph hominis.  She was started to IV vanco.  Repeat blood cultures remained negative, but her WBC count continued to rise.  She remained largely afebrile and she has been hemodynamically stable throughout the hospitalization.  CTs of chest/abd/pelvis were negative for source of infection.  Repeat urine cultures were negative.  She was started empirically on ceftriaxone and clinda for suspicion of aspiration pneumonitis, but WBC did not improve.  Goals of care discussion was held with family, but they wanted another 1-2 days to think about possible transition to comfort care and residential hospice.  Palliative medicine has subsequently been consulted.  Assessment/Plan: TIA/facial droop/left-sided weakness -Secondary to TIA -MRI brain negative -Patient seen by neurology-->recommended aspirin Plavix x3 months followed by Plavix monotherapy daily -Carotid ultrasound showed bilateral carotid occlusion, right-sided  70% and 50% left side. -Niece did not want any aggressive intervention  CoNS Bacteremia--Staph hominis -unclear source -repeat blood culture neg -continue IV vanco -07/07/19 Echo--no vegetation;  EF 65-70%  Acute toxic/metabolic encephalopathy -Discontinue clonazepam -Continue empiric ceftriaxone -Personally reviewed chest x-ray--bibasilar opacities -Check ammonia--28 -Check serum I78--676 -Check folic HMCN--47.0 -TSH 9.628 -07/12/2019 UA--no pyuria -EEG--neg for seizure -mental status has wax and waned but overall has not made significant improvement  Leukocytosis -remains hemodynamically stable -CT chest--no infiltrates, no effusions or edema -7/2 repeat blood culture neg -7/2 repeat UA-no pyuria -WBC rising but she remains afebrile and hemodynamically stable -CT abd/pelvis--neg for acute findings -repeat blood culture -07/16/19--repeat Urine culture neg -no diarrhea  Essential hypertension -Continue ARB  Hyperlipidemia -Continue statin  Anxiety/depression -Holdingclonazepam -continue sertraline  Hypokalemia -replete -check mag2.1  Goals of Care -Advance care planning, including the explanation and discussion of advance directives was carried out with the patient and family. Code status including explanations of "Full Code" and "DNR" and alternatives were discussed in detail. Discussion of end-of-life issues including but not limited palliative care, hospice care and the concept of hospice, other end-of-life care options, power of attorney for health care decisions, living wills, and physician orders for life-sustaining treatment were also discussed with the patient and family. Total face to face time 16 minutes. -confirmed DNR with niece -07/17/19--had goals of care discussion with niece.  Discussed overall prognosis and unlikelihood of patient returning to pre-morbid level of function.  Discussed continued decline clinically despite optimal care.  Family to  discuss options including residential hospice.  Family does not want any invasive procedures but would like another 24-48 hours to observe patient's clinical course.     Status is: Inpatient  Remains inpatient appropriate becauseremains encephalopathic on  IV abx   Dispo: The patient is from:ALF Anticipated d/c is to:SNF Anticipated d/c date is:2days Patient currently is not medically stable to d/c.        Family Communication:Niece updated7/3  Consultants:neuro  Code Status: DNR  DVT Prophylaxis: [place] Lovenox   Procedures: As Listed in Progress Note Above  Antibiotics: Ceftriaxone 6/29>>> vanco 7/1>>> clinda 7/3>>>      Subjective: Patient states "why are doing this to me" when she was moved and turned for exam.  No reports of vomiting, diarrhea, sob, respiratory distress  Objective: Vitals:   07/17/19 1352 07/17/19 2135 07/18/19 0534 07/18/19 1354  BP: (!) 171/74 (!) 170/68 (!) 178/82 (!) 177/80  Pulse: 84 84 92 92  Resp: 20 18 18 16   Temp: 98.1 F (36.7 C) 99.6 F (37.6 C) 98.9 F (37.2 C) 98.7 F (37.1 C)  TempSrc:  Axillary Axillary Oral  SpO2: 93% 94% 98% 97%  Weight:      Height:       No intake or output data in the 24 hours ending 07/18/19 1505 Weight change:  Exam:   General:  Pt is alert, does not follow commands appropriately, not in acute distress  HEENT: No icterus, No thrush, No neck mass, [place]/AT  Cardiovascular: RRR, S1/S2, no rubs, no gallops  Respiratory: bibasilar crackles. No wheeze  Abdomen: Soft/+BS, non tender, non distended, no guarding  Extremities: No edema, No lymphangitis, No petechiae, No rashes, no synovitis   Data Reviewed: I have personally reviewed following labs and imaging studies Basic Metabolic Panel: Recent Labs  Lab 07/12/19 1250 07/14/19 0616 07/14/19 1415 07/15/19 0448 07/16/19 0500 07/17/19 0619  NA 136 136  --  138  138 139  K 3.6 3.4*  --  3.5 3.5 3.5  CL 100 101  --  105 107 106  CO2 23 23  --  23 21* 21*  GLUCOSE 123* 131*  --  122* 119* 142*  BUN 26* 25*  --  28* 30* 29*  CREATININE 0.78 0.75  --  0.72 0.70 0.76  CALCIUM 8.8* 8.7*  --  8.6* 8.5* 8.8*  MG  --   --  2.1  --  2.0  --    Liver Function Tests: Recent Labs  Lab 07/14/19 0616 07/17/19 0619  AST 24 36  ALT 25 36  ALKPHOS 58 68  BILITOT 0.7 0.8  PROT 6.8 6.8  ALBUMIN 3.1* 2.7*   No results for input(s): LIPASE, AMYLASE in the last 168 hours. Recent Labs  Lab 07/14/19 1415  AMMONIA 28   Coagulation Profile: No results for input(s): INR, PROTIME in the last 168 hours. CBC: Recent Labs  Lab 07/14/19 0616 07/15/19 0448 07/16/19 0500 07/17/19 0619 07/18/19 0543  WBC 15.2* 15.5* 19.4* 22.9* 25.1*  HGB 10.0* 9.4* 9.1* 10.7* 9.7*  HCT 30.9* 29.6* 28.3* 32.5* 30.2*  MCV 96.3 97.7 97.3 96.7 97.1  PLT 224 249 233 284 292   Cardiac Enzymes: No results for input(s): CKTOTAL, CKMB, CKMBINDEX, TROPONINI in the last 168 hours. BNP: Invalid input(s): POCBNP CBG: No results for input(s): GLUCAP in the last 168 hours. HbA1C: No results for input(s): HGBA1C in the last 72 hours. Urine analysis:    Component Value Date/Time   COLORURINE YELLOW 07/16/2019 [place] 07/16/2019 1133   LABSPEC 1.019 07/16/2019 1133   PHURINE 5.0 07/16/2019 1133   GLUCOSEU NEGATIVE 07/16/2019 1133   HGBUR MODERATE (A) 07/16/2019 1133   BILIRUBINUR NEGATIVE 07/16/2019 1133   KETONESUR  NEGATIVE 07/16/2019 1133   PROTEINUR 100 (A) 07/16/2019 1133   UROBILINOGEN 1.0 11/02/2011 2208   NITRITE NEGATIVE 07/16/2019 1133   LEUKOCYTESUR NEGATIVE 07/16/2019 1133   Sepsis Labs: @LABRCNTIP (procalcitonin:4,lacticidven:4) ) Recent Results (from the past 240 hour(s))  SARS Coronavirus 2 by RT PCR (hospital order, performed in [facility] hospital lab) Nasopharyngeal Nasopharyngeal Swab     Status: None   Collection Time: 07/12/19 11:56  AM   Specimen: Nasopharyngeal Swab  Result Value Ref Range Status   SARS Coronavirus 2 NEGATIVE NEGATIVE Final    Comment: (NOTE) SARS-CoV-2 target nucleic acids are NOT DETECTED.  The SARS-CoV-2 RNA is generally detectable in upper and lower respiratory specimens during the acute phase of infection. The lowest concentration of SARS-CoV-2 viral copies this assay can detect is 250 copies / mL. A negative result does not preclude SARS-CoV-2 infection and should not be used as the sole basis for treatment or other patient management decisions.  A negative result may occur with improper specimen collection / handling, submission of specimen other than nasopharyngeal swab, presence of viral mutation(s) within the areas targeted by this assay, and inadequate number of viral copies (<250 copies / mL). A negative result must be combined with clinical observations, patient history, and epidemiological information.  Fact Sheet for Patients:   StrictlyIdeas.no  Fact Sheet for Healthcare Providers: BankingDealers.co.za  This test is not yet approved or  cleared by the [place] FDA and has been authorized for detection and/or diagnosis of SARS-CoV-2 by FDA under an Emergency Use Authorization (EUA).  This EUA will remain in effect (meaning this test can be used) for the duration of the COVID-19 declaration under Section 564(b)(1) of the Act, 21 U.S.C. section 360bbb-3(b)(1), unless the authorization is terminated or revoked sooner.  Performed at [facility], [address] [place], [place] [address]   Culture, blood (routine x 2)     Status: Abnormal   Collection Time: 07/13/19  9:48 PM   Specimen: BLOOD RIGHT HAND  Result Value Ref Range Status   Specimen Description   Final    BLOOD RIGHT HAND Performed at [facility], [address], [place], [place] [address]    Special Requests   Final    BOTTLES DRAWN AEROBIC AND ANAEROBIC Blood  Culture adequate volume Performed at [facility] [place] [facility], [address] [place], [place] [address]    Culture  Setup Time   Final    GRAM POSITIVE COCCI IN BOTH AEROBIC AND ANAEROBIC BOTTLES Gram Stain Report Called to,Read Back By and Verified With:  TAYLOR,M [id number] 07/14/2019 BY TJONES Performed at [facility], [address], [place], [place] [address]    Culture (A)  Final    STAPHYLOCOCCUS HOMINIS SUSCEPTIBILITIES PERFORMED ON PREVIOUS CULTURE WITHIN THE LAST 5 DAYS. Performed at [facility], [place] [address] [place], [place] [address]    Report Status 07/16/2019 FINAL  Final  Culture, blood (routine x 2)     Status: Abnormal   Collection Time: 07/13/19  9:48 PM   Specimen: BLOOD  Result Value Ref Range Status   Specimen Description   Final    BLOOD BLOOD RIGHT WRIST Performed at [facility], [address] [place], [place] [address]    Special Requests   Final    BOTTLES DRAWN AEROBIC AND ANAEROBIC Blood Culture adequate volume Performed at [facility], [address], [place], [place] [address]    Culture  Setup Time   Final    GRAM POSITIVE COCCI ANAEROBIC  BOTTLE Gram Stain Report Called to,Read Back By and Verified With: Ardis Hughs 07/14/2019 BY TJONES [place] CRITICAL RESULT CALLED TO, READ BACK BY AND VERIFIED WITH: Geni Bers RN 07/14/19 1906 JDW Performed at [facility], [place] [address], [place], [place] [address]    Culture STAPHYLOCOCCUS HOMINIS (A)  Final   Report Status 07/16/2019 FINAL  Final   Organism ID, Bacteria STAPHYLOCOCCUS HOMINIS  Final      Susceptibility   Staphylococcus hominis - MIC*    CIPROFLOXACIN 2 INTERMEDIATE Intermediate     ERYTHROMYCIN >=8 RESISTANT Resistant     GENTAMICIN <=0.5 SENSITIVE Sensitive     OXACILLIN 0.5 RESISTANT Resistant     TETRACYCLINE >=16 RESISTANT Resistant     VANCOMYCIN 1 SENSITIVE Sensitive     TRIMETH/SULFA <=10 SENSITIVE Sensitive     CLINDAMYCIN <=0.25 SENSITIVE Sensitive     RIFAMPIN <=0.5  SENSITIVE Sensitive     Inducible Clindamycin NEGATIVE Sensitive     * STAPHYLOCOCCUS HOMINIS  Blood Culture ID Panel (Reflexed)     Status: Abnormal   Collection Time: 07/13/19  9:48 PM  Result Value Ref Range Status   Enterococcus species NOT DETECTED NOT DETECTED Final   Listeria monocytogenes NOT DETECTED NOT DETECTED Final   Staphylococcus species DETECTED (A) NOT DETECTED Final    Comment: Methicillin (oxacillin) resistant coagulase negative staphylococcus. Possible blood culture contaminant (unless isolated from more than one blood culture draw or clinical case suggests pathogenicity). No antibiotic treatment is indicated for blood  culture contaminants. CRITICAL RESULT CALLED TO, READ BACK BY AND VERIFIED WITH: Geni Bers RN 07/14/19 1906 JDW    Staphylococcus aureus (BCID) NOT DETECTED NOT DETECTED Final   Methicillin resistance DETECTED (A) NOT DETECTED Final    Comment: CRITICAL RESULT CALLED TO, READ BACK BY AND VERIFIED WITH: Geni Bers RN 07/14/19 1906 JDW    Streptococcus species NOT DETECTED NOT DETECTED Final   Streptococcus agalactiae NOT DETECTED NOT DETECTED Final   Streptococcus pneumoniae NOT DETECTED NOT DETECTED Final   Streptococcus pyogenes NOT DETECTED NOT DETECTED Final   Acinetobacter baumannii NOT DETECTED NOT DETECTED Final   Enterobacteriaceae species NOT DETECTED NOT DETECTED Final   Enterobacter cloacae complex NOT DETECTED NOT DETECTED Final   Escherichia coli NOT DETECTED NOT DETECTED Final   Klebsiella oxytoca NOT DETECTED NOT DETECTED Final   Klebsiella pneumoniae NOT DETECTED NOT DETECTED Final   Proteus species NOT DETECTED NOT DETECTED Final   Serratia marcescens NOT DETECTED NOT DETECTED Final   Haemophilus influenzae NOT DETECTED NOT DETECTED Final   Neisseria meningitidis NOT DETECTED NOT DETECTED Final   Pseudomonas aeruginosa NOT DETECTED NOT DETECTED Final   Candida albicans NOT DETECTED NOT DETECTED Final   Candida glabrata NOT DETECTED NOT  DETECTED Final   Candida krusei NOT DETECTED NOT DETECTED Final   Candida parapsilosis NOT DETECTED NOT DETECTED Final   Candida tropicalis NOT DETECTED NOT DETECTED Final    Comment: Performed at [facility], [place], [place], [place] [address]  Culture, Urine     Status: Abnormal   Collection Time: 07/14/19  3:54 AM   Specimen: Urine, Clean Catch  Result Value Ref Range Status   Specimen Description   Final    URINE, CLEAN CATCH Performed at [facility], [address], [place], [place] [address]    Special Requests   Final    NONE Performed at [facility], [address] [place],  [address]    Culture  30,000 COLONIES/mL KLEBSIELLA PNEUMONIAE (A)  Final   Report Status 07/17/2019 FINAL  Final   Organism ID, Bacteria KLEBSIELLA PNEUMONIAE (A)  Final      Susceptibility   Klebsiella pneumoniae - MIC*    AMPICILLIN RESISTANT Resistant     CEFAZOLIN <=4 SENSITIVE Sensitive     CEFTRIAXONE <=0.25 SENSITIVE Sensitive     CIPROFLOXACIN <=0.25 SENSITIVE Sensitive     GENTAMICIN <=1 SENSITIVE Sensitive     IMIPENEM <=0.25 SENSITIVE Sensitive     NITROFURANTOIN 32 SENSITIVE Sensitive     TRIMETH/SULFA <=20 SENSITIVE Sensitive     AMPICILLIN/SULBACTAM 4 SENSITIVE Sensitive     PIP/TAZO <=4 SENSITIVE Sensitive     * 30,000 COLONIES/mL KLEBSIELLA PNEUMONIAE  Culture, blood (Routine X 2) w Reflex to ID Panel     Status: None (Preliminary result)   Collection Time: 07/16/19  5:00 AM   Specimen: BLOOD RIGHT WRIST  Result Value Ref Range Status   Specimen Description   Final    BLOOD RIGHT WRIST BOTTLES DRAWN AEROBIC AND ANAEROBIC   Special Requests Blood Culture adequate volume  Final   Culture   Final    NO GROWTH 2 DAYS Performed at [facility], [address] [place], [place] [address]    Report Status PENDING  Incomplete  Culture, blood (Routine X 2) w Reflex to ID Panel     Status: None (Preliminary result)   Collection Time: 07/16/19  5:05 AM   Specimen:  BLOOD RIGHT HAND  Result Value Ref Range Status   Specimen Description   Final    BLOOD RIGHT HAND BOTTLES DRAWN AEROBIC AND ANAEROBIC   Special Requests Blood Culture adequate volume  Final   Culture   Final    NO GROWTH 2 DAYS Performed at [facility], [address], [place], [place] [address]    Report Status PENDING  Incomplete  Culture, Urine     Status: None   Collection Time: 07/16/19 11:33 AM   Specimen: Urine, Clean Catch  Result Value Ref Range Status   Specimen Description   Final    URINE, CLEAN CATCH Performed at [facility], [address], [place], [place] [address]    Special Requests   Final    NONE Performed at [facility], [address] [place], [place] [address]    Culture   Final    NO GROWTH Performed at [facility], [place] [address], [place],  [address]    Report Status 07/17/2019 FINAL  Final     Scheduled Meds: . aspirin EC  81 mg Oral Daily  . atorvastatin  40 mg Oral Daily  . clopidogrel  75 mg Oral Daily  . enoxaparin (LOVENOX) injection  40 mg Subcutaneous Q24H  . irbesartan  37.5 mg Oral Daily  . mirabegron ER  25 mg Oral QHS  . sertraline  100 mg Oral Daily   Continuous Infusions: . 0.9 % NaCl with KCl 20 mEq / L 75 mL/hr at 07/18/19 0530  . cefTRIAXone (ROCEPHIN)  IV 1 g (07/17/19 2038)  . clindamycin (CLEOCIN) IV 600 mg (07/18/19 1334)  . vancomycin 750 mg (07/18/19 1108)    Procedures/Studies: CT ABDOMEN PELVIS WO CONTRAST  Result Date: 07/16/2019 CLINICAL DATA:  Abdominal pain, leukocytosis, altered mental status EXAM: CT ABDOMEN AND PELVIS WITHOUT CONTRAST TECHNIQUE: Multidetector CT imaging of the abdomen and pelvis was performed following the standard protocol without IV contrast. COMPARISON:  02/05/2018 FINDINGS: Lower chest: Bandlike scarring and or atelectasis  of the included bilateral lung bases. Three-vessel coronary artery calcifications and stents. Hepatobiliary: No focal liver abnormality is seen. Status post  cholecystectomy. No biliary dilatation. Pancreas: Unremarkable. No pancreatic ductal dilatation or surrounding inflammatory changes. Spleen: Normal in size without significant abnormality. Adrenals/Urinary Tract: Adrenal glands are unremarkable. Kidneys are normal, without renal calculi, solid lesion, or hydronephrosis. Bladder is unremarkable. Stomach/Bowel: Stomach is within normal limits. Appendix is surgically absent no evidence of bowel wall thickening, distention, or inflammatory changes. Occasional sigmoid diverticula. Vascular/Lymphatic: Aortic atherosclerosis. No enlarged abdominal or pelvic lymph nodes. Reproductive: Status post hysterectomy. Other: No abdominal wall hernia or abnormality. No abdominopelvic ascites. Musculoskeletal: No acute or significant osseous findings. IMPRESSION: 1. No non-contrast CT findings of the abdomen or pelvis to explain abdominal pain or leukocytosis. 2. Status post cholecystectomy, appendectomy, and hysterectomy. 3. Coronary artery disease.  Aortic Atherosclerosis (ICD10-I70.0). Electronically Signed   By: Eddie Candle M.D.   On: 07/16/2019 20:59   CT Head Wo Contrast  Result Date: 07/06/2019 CLINICAL DATA:  Cognitive decline. Stroke suspected. Focal neuro deficit. EXAM: CT HEAD WITHOUT CONTRAST TECHNIQUE: Contiguous axial images were obtained from the base of the skull through the vertex without intravenous contrast. COMPARISON:  01/01/2019 FINDINGS: Brain: Oblique imaging plane. There is no evidence for acute hemorrhage, hydrocephalus, mass lesion, or abnormal extra-axial fluid collection. No definite CT evidence for acute infarction. Diffuse loss of parenchymal volume is consistent with atrophy. Patchy low attenuation in the deep hemispheric and periventricular white matter is nonspecific, but likely reflects chronic microvascular ischemic demyelination. Vascular: No hyperdense vessel or unexpected calcification. Skull: No evidence for fracture. No worrisome lytic or  sclerotic lesion. Sinuses/Orbits: The visualized paranasal sinuses and mastoid air cells are clear. Visualized portions of the globes and intraorbital fat are unremarkable. Other: None. IMPRESSION: 1. No acute intracranial abnormality. 2. Atrophy with chronic small vessel white matter ischemic disease. Electronically Signed   By: Misty Stanley M.D.   On: 07/06/2019 17:36   CT CHEST WO CONTRAST  Result Date: 07/14/2019 CLINICAL DATA:  Cough with abnormal chest radiograph. Evaluate for pneumonia, pleural effusion or abscess. EXAM: CT CHEST WITHOUT CONTRAST TECHNIQUE: Multidetector CT imaging of the chest was performed following the standard protocol without IV contrast. COMPARISON:  Chest radiograph 07/12/2019 FINDINGS: Cardiovascular: Cardiac enlargement. Aortic atherosclerosis. Three vessel coronary artery atherosclerotic calcifications. Mediastinum/Nodes: Normal appearance of the thyroid gland. The trachea appears patent and is midline. Normal appearance of the esophagus. No enlarged mediastinal, supraclavicular, or axillary lymph nodes. Lungs/Pleura: Lung window detail is diminished by respiratory motion artifact. No significant pleural effusion. Mild posterior pleural thickening is identified bilaterally which is favored to represent dependent changes. No airspace consolidation, atelectasis or pneumothorax. No findings of a pulmonary abscess. Scar identified within the posterolateral right lower lobe. No suspicious pulmonary nodule or mass identified Upper Abdomen: No acute abnormality within the limited images through the upper abdomen. Musculoskeletal: Spondylosis noted within the thoracic spine. No acute or suspicious osseous abnormality. IMPRESSION: 1. No acute cardiopulmonary abnormalities. No evidence for pulmonary abscess, pleural effusion, or pneumonia. 2. Cardiac enlargement, aortic atherosclerosis and 3 vessel coronary artery atherosclerotic calcifications. Aortic Atherosclerosis (ICD10-I70.0).  Electronically Signed   By: Kerby Moors M.D.   On: 07/14/2019 16:34   MR ANGIO HEAD WO CONTRAST  Result Date: 07/07/2019 CLINICAL DATA:  Altered mental status EXAM: MRI HEAD WITHOUT CONTRAST MRA HEAD WITHOUT CONTRAST TECHNIQUE: Multiplanar, multiecho pulse sequences of the brain and surrounding structures were obtained without intravenous contrast. Angiographic images of the head were  obtained using MRA technique without contrast. COMPARISON:  None. FINDINGS: MRI HEAD Motion artifact is present. Brain: There is no acute infarction or intracranial hemorrhage. There is no intracranial mass, mass effect, or edema. There is no hydrocephalus or extra-axial fluid collection. Prominence of the ventricles and sulci reflects generalized parenchymal volume loss. Patchy T2 hyperintensity in the supratentorial white matter is nonspecific but may reflect mild to moderate chronic microvascular ischemic changes. Vascular: Major vessel flow voids at the skull base are preserved. Skull and upper cervical spine: Normal marrow signal is preserved. Sinuses/Orbits: Minor mucosal thickening.  Orbits are unremarkable. Other: Sella is unremarkable.  Mastoid air cells are clear. MRA HEAD Motion artifact is present. Intracranial internal carotid arteries are patent. Proximal middle and anterior cerebral artery segments appear grossly patent but are poorly visualized. Intracranial vertebral arteries, basilar artery, and proximal posterior cerebral arteries are patent. IMPRESSION: Motion degraded studies. No evidence of acute infarction, hemorrhage, or mass. No proximal intracranial vessel occlusion. Electronically Signed   By: Macy Mis M.D.   On: 07/07/2019 13:00   MR BRAIN WO CONTRAST  Result Date: 07/07/2019 CLINICAL DATA:  Altered mental status EXAM: MRI HEAD WITHOUT CONTRAST MRA HEAD WITHOUT CONTRAST TECHNIQUE: Multiplanar, multiecho pulse sequences of the brain and surrounding structures were obtained without intravenous  contrast. Angiographic images of the head were obtained using MRA technique without contrast. COMPARISON:  None. FINDINGS: MRI HEAD Motion artifact is present. Brain: There is no acute infarction or intracranial hemorrhage. There is no intracranial mass, mass effect, or edema. There is no hydrocephalus or extra-axial fluid collection. Prominence of the ventricles and sulci reflects generalized parenchymal volume loss. Patchy T2 hyperintensity in the supratentorial white matter is nonspecific but may reflect mild to moderate chronic microvascular ischemic changes. Vascular: Major vessel flow voids at the skull base are preserved. Skull and upper cervical spine: Normal marrow signal is preserved. Sinuses/Orbits: Minor mucosal thickening.  Orbits are unremarkable. Other: Sella is unremarkable.  Mastoid air cells are clear. MRA HEAD Motion artifact is present. Intracranial internal carotid arteries are patent. Proximal middle and anterior cerebral artery segments appear grossly patent but are poorly visualized. Intracranial vertebral arteries, basilar artery, and proximal posterior cerebral arteries are patent. IMPRESSION: Motion degraded studies. No evidence of acute infarction, hemorrhage, or mass. No proximal intracranial vessel occlusion. Electronically Signed   By: Macy Mis M.D.   On: 07/07/2019 13:00   US Carotid Bilateral  Result Date: 07/07/2019 CLINICAL DATA:  TIA.  History of hypertension. EXAM: BILATERAL CAROTID DUPLEX ULTRASOUND TECHNIQUE: Pearline Cables scale imaging, color Doppler and duplex ultrasound were performed of bilateral carotid and vertebral arteries in the neck. COMPARISON:  None. FINDINGS: Criteria: Quantification of carotid stenosis is based on velocity parameters that correlate the residual internal carotid diameter with NASCET-based stenosis levels, using the diameter of the distal internal carotid lumen as the denominator for stenosis measurement. The following velocity measurements were  obtained: RIGHT ICA: 216/19 cm/sec CCA: 56/8 cm/sec SYSTOLIC ICA/CCA RATIO:  2.2 ECA: 40 cm/sec LEFT ICA: 80/18 cm/sec CCA: 12/75 cm/sec SYSTOLIC ICA/CCA RATIO:  1.2 ECA: 112 cm/sec RIGHT CAROTID ARTERY: There is a large amount of eccentric echogenic plaque within the right carotid bulb (image 21 and 24), extending to involve the origin and proximal aspects of the right internal carotid artery (image 34), which results in elevated peak systolic velocities within the proximal aspect the right internal carotid artery. Greatest acquired peak systolic velocity with the proximal right ICA measures 216 centimeters/second (image 37). RIGHT VERTEBRAL ARTERY:  Antegrade Flow LEFT CAROTID ARTERY: There is a large amount of eccentric echogenic plaque within the left carotid bulb (images 68 and 71), extending to involve the origin and proximal aspects of the left internal carotid artery (image 82), morphologically resulting at least 50% luminal narrowing, though definitely resulting in elevated peak systolic velocities within the interrogated course of the left internal carotid artery to suggest a hemodynamically significant stenosis. LEFT VERTEBRAL ARTERY:  Antegrade flow IMPRESSION: 1. Large amount of right-sided atherosclerotic plaque results in elevated peak systolic velocities within the right internal carotid artery which approach the greater than 70% luminal narrowing range. Further evaluation with CTA could performed as clinically indicated. 2. Large amount of left-sided atherosclerotic plaque, morphologically results in at least 50% luminal narrowing, though does not result in elevated peak systolic velocities within the interrogated course of the left internal carotid artery to suggest a hemodynamically significant stenosis. Again, further evaluation with CTA could performed as indicated. 3. Antegrade flow demonstrated within the bilateral vertebral arteries. Electronically Signed   By: Sandi Mariscal M.D.   On:  07/07/2019 16:33   US Venous Img Upper Uni Left (DVT)  Result Date: 07/18/2019 CLINICAL DATA:  86 year old female with a history of left arm edema EXAM: LEFT UPPER EXTREMITY VENOUS DOPPLER ULTRASOUND TECHNIQUE: Gray-scale sonography with graded compression, as well as color Doppler and duplex ultrasound were performed to evaluate the upper extremity deep venous system from the level of the subclavian vein and including the jugular, axillary, basilic, radial, ulnar and upper cephalic vein. Spectral Doppler was utilized to evaluate flow at rest and with distal augmentation maneuvers. COMPARISON:  None. FINDINGS: Contralateral Subclavian Vein: Respiratory phasicity is normal and symmetric with the symptomatic side. No evidence of thrombus. Normal compressibility. Internal Jugular Vein: Not imaged Subclavian Vein: No evidence of thrombus. Normal compressibility, respiratory phasicity and response to augmentation. Axillary Vein: No evidence of thrombus. Normal compressibility, respiratory phasicity and response to augmentation. Cephalic Vein: No evidence of thrombus. Normal compressibility, respiratory phasicity and response to augmentation. Basilic Vein: No evidence of thrombus. Normal compressibility, respiratory phasicity and response to augmentation. Brachial Veins: No evidence of thrombus. Normal compressibility, respiratory phasicity and response to augmentation. Radial Veins: No evidence of thrombus. Normal compressibility, respiratory phasicity and response to augmentation. Ulnar Veins: No evidence of thrombus. Normal compressibility, respiratory phasicity and response to augmentation. Other Findings:  None visualized. IMPRESSION: Sonographic survey of the left upper extremity negative for DVT Electronically Signed   By: Corrie Mckusick D.O.   On: 07/18/2019 11:24   DG CHEST PORT 1 VIEW  Result Date: 07/12/2019 CLINICAL DATA:  Leukocytosis.  Altered mental status. EXAM: PORTABLE CHEST 1 VIEW COMPARISON:   03/10/2018 FINDINGS: Lung volumes are low.The cardiomediastinal contours are normal. Aortic atherosclerosis. Patient's chin partially obscures evaluation of the left lung apex, could not tolerate repositioning. Pulmonary vasculature is normal. No consolidation, pleural effusion, or pneumothorax. No acute osseous abnormalities are seen. IMPRESSION: Low lung volumes without acute abnormality. Aortic Atherosclerosis (ICD10-I70.0). Electronically Signed   By: Keith Rake M.D.   On: 07/12/2019 21:19   EEG adult  Result Date: 07/14/2019 Alexis Goodell, MD     07/14/2019  3:49 PM ELECTROENCEPHALOGRAM REPORT Patient: [name]       Room #: A79 Age: 86 y.o.        Sex: female Requesting Physician: Jakeline Dave Report Date:  07/14/2019       Interpreting Physician: Alexis Goodell History: ELLAWYN WOGAN is an 86 y.o. female with altered mental  status Medications: ASA, Lipitor, Rocephin, Plavix, Avapro, Myrbetriq, Zoloft Conditions of Recording:  This is a 21 channel routine scalp EEG performed with bipolar and monopolar montages arranged in accordance to the international 10/20 system of electrode placement. One channel was dedicated to EKG recording. The patient is in the drowsy and asleep states. Description:  The patient appears asleep for much of the recording.  The background is slow and poorly organized with superimposed symmetrical sleep spindles and vertex central sharp transients noted.  Towards the end of the recording the patient does open her eyes and although she does not respond the background does activate with a mixture of theta and alpha activity noted.  No epileptiform activity is noted. Hyperventilation and intermittent photic stimulation were not performed. IMPRESSION: This electroencephalogram is consistent with normal drowse and sleep.  No epileptiform activity is noted.  Alexis Goodell, MD Neurology [phone] 07/14/2019, 3:44 PM   ECHOCARDIOGRAM COMPLETE  Result Date: 07/07/2019     ECHOCARDIOGRAM REPORT   Patient Name:   JACLYNNE BALDO Date of Exam: 07/07/2019 Medical Rec #:  [id number]       Height:       66.0 in Accession #:    [id number]      Weight:       142.4 lb Date of Birth:  1917-10-10       BSA:          1.731 m Patient Age:    86 years        BP:           137/59 mmHg Patient Gender: F               HR:           67 bpm. Exam Location:  [facility] Procedure: 2D Echo Indications:    TIA 435.9 / G45.9  History:        Patient has no prior history of Echocardiogram examinations.                 Risk Factors:Hypertension, Dyslipidemia and Non-Smoker.  Sonographer:    [name] RDCS (AE) Referring Phys: [place]  1. Left ventricular ejection fraction, by estimation, is 65 to 70%. The left ventricle has normal function. The left ventricle has no regional wall motion abnormalities. There is moderate left ventricular hypertrophy. Left ventricular diastolic parameters are indeterminate.  2. Right ventricular systolic function is normal. The right ventricular size is normal. There is normal pulmonary artery systolic pressure.  3. The mitral valve is normal in structure. No evidence of mitral valve regurgitation. No evidence of mitral stenosis.  4. The aortic valve is tricuspid. Aortic valve regurgitation is not visualized. No aortic stenosis is present.  5. The inferior vena cava is normal in size with greater than 50% respiratory variability, suggesting right atrial pressure of 3 mmHg. FINDINGS  Left Ventricle: Left ventricular ejection fraction, by estimation, is 65 to 70%. The left ventricle has normal function. The left ventricle has no regional wall motion abnormalities. The left ventricular internal cavity size was normal in size. There is  moderate left ventricular hypertrophy. Left ventricular diastolic parameters are indeterminate. Right Ventricle: The right ventricular size is normal. Right vetricular wall thickness was not assessed. Right ventricular  systolic function is normal. There is normal pulmonary artery systolic pressure. The tricuspid regurgitant velocity is 1.93 m/s, and with an assumed right atrial pressure of 10 mmHg, the estimated right ventricular systolic pressure is 00.8 mmHg. Left  Atrium: Left atrial size was normal in size. Right Atrium: Right atrial size was not well visualized. Pericardium: There is no evidence of pericardial effusion. Mitral Valve: The mitral valve is normal in structure. No evidence of mitral valve regurgitation. No evidence of mitral valve stenosis. Tricuspid Valve: The tricuspid valve is normal in structure. Tricuspid valve regurgitation is trivial. No evidence of tricuspid stenosis. Aortic Valve: The aortic valve is tricuspid. Aortic valve regurgitation is not visualized. No aortic stenosis is present. Aortic valve mean gradient measures 2.6 mmHg. Aortic valve peak gradient measures 4.3 mmHg. Aortic valve area, by VTI measures 2.94 cm. Pulmonic Valve: The pulmonic valve was not well visualized. Pulmonic valve regurgitation is not visualized. No evidence of pulmonic stenosis. Aorta: The aortic root is normal in size and structure. Pulmonary Artery: Indeterminant PASP, inadequate TR jet. Venous: The inferior vena cava is normal in size with greater than 50% respiratory variability, suggesting right atrial pressure of 3 mmHg. IAS/Shunts: No atrial level shunt detected by color flow Doppler.  LEFT VENTRICLE PLAX 2D LVIDd:         3.54 cm  Diastology LVIDs:         1.88 cm  LV e' lateral:   7.07 cm/s LV PW:         1.26 cm  LV E/e' lateral: 9.7 LV IVS:        1.37 cm  LV e' medial:    5.44 cm/s LVOT diam:     2.00 cm  LV E/e' medial:  12.7 LV SV:         69 LV SV Index:   40 LVOT Area:     3.14 cm  RIGHT VENTRICLE RV S prime:     14.00 cm/s TAPSE (M-mode): 1.8 cm LEFT ATRIUM           Index       RIGHT ATRIUM           Index LA diam:      3.60 cm 2.08 cm/m  RA Area:     12.20 cm LA Vol (A2C): 30.5 ml 17.62 ml/m RA  Volume:   29.70 ml  17.16 ml/m LA Vol (A4C): 38.8 ml 22.41 ml/m  AORTIC VALVE AV Area (Vmax):    2.98 cm AV Area (Vmean):   2.64 cm AV Area (VTI):     2.94 cm AV Vmax:           104.25 cm/s AV Vmean:          76.936 cm/s AV VTI:            0.236 m AV Peak Grad:      4.3 mmHg AV Mean Grad:      2.6 mmHg LVOT Vmax:         98.95 cm/s LVOT Vmean:        64.652 cm/s LVOT VTI:          0.221 m LVOT/AV VTI ratio: 0.94  AORTA Ao Root diam: 3.00 cm MITRAL VALVE               TRICUSPID VALVE MV Area (PHT): 3.03 cm    TR Peak grad:   14.9 mmHg MV Decel Time: 250 msec    TR Vmax:        193.00 cm/s MV E velocity: 68.90 cm/s MV A velocity: 57.70 cm/s  SHUNTS MV E/A ratio:  1.19        Systemic VTI:  0.22 m  Systemic Diam: 2.00 cm Carlyle Dolly MD Electronically signed by Carlyle Dolly MD Signature Date/Time: 07/07/2019/3:58:13 PM    Final     Orson Eva, DO  [facility]  If 7PM-7AM, please contact night-coverage [email or web address] Password [email or web address] 07/18/2019, 3:05 PM   LOS: 9 days

## 2019-07-18 NOTE — Evaluation (Signed)
Clinical/Bedside Swallow Evaluation Patient Details  Name: Deborah Farrell MRN: [id number] Date of Birth: 12/27/1917  Today's Date: 07/18/2019 Time: SLP Start Time (ACUTE ONLY): 1100 SLP Stop Time (ACUTE ONLY): 1128 SLP Time Calculation (min) (ACUTE ONLY): 28 min  Past Medical History:  Past Medical History:  Diagnosis Date  . Allergy history, radiographic dye   . Arthritis   . Dysuria   . Glaucoma   . HTN (hypertension)   . IBS (irritable bowel syndrome)    Non-ulcer dyspepsia; hemorrhoids  . Overactive bladder   . 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 Urinary frequency   . Urinary tract infection    Past Surgical History:  Past Surgical History:  Procedure Laterality Date  . APPENDECTOMY    . CHOLECYSTECTOMY    . COLONOSCOPY  2008, 2011   diverticulitis diagnosed, 82mm sessile polyp removed inascending colon. area tattood for future observation and reccomended f/u colonoscopy in one year  . COLONOSCOPY N/A 04/24/2012   SLF:The colon IS redundant/ Single RECTAL polyp/Moderate diverticulosis/Small internal hemorrhoids  . OVARIAN CYST REMOVAL    . VAGINAL HYSTERECTOMY     HPI:  86 year old female with a history of dementia, hypertension, IBS presenting with lethargy from [place].  According to the patient's niece, the patient is able to ambulate with a walker and is able to speak and carry on a conversation albeit she is confused at baseline.  She is normally able to feed herself but does require assistance with her other activities of daily living.  In addition, there was new onset left facial droop and left-sided weakness.  Since admission, MRI of the brain was obtained which showed no acute findings.  However neurology was consulted, and there was concern for TIA.  The patient was started on aspirin and Plavix.  Patient continued to be somnolent requiring  assistance with feeding.  PT evaluated the patient and recommended skilled nursing facility.  She remained encephalopathic.  As result, oral intake remained poor. Pt passed [facility] swallow screen upon admission on 07/07/19, however Pt noted to hold food in mouth and gag yesterday so BSE requested.    Assessment / Plan / Recommendation Clinical Impression  Clinical swallow evaluation completed at bedside. Pt reportedly more alert today than previous day or two. She is positioned with her head turned to the left and was unable to move her head midline despite gentle assist from SLP. Pt with mild generalized oral weakness, but functional for po intake. Pt with reduced oral control with liquids presented via tsp and cup due to head positioning, improved control and performance with straw sips of thin. Pt without overt signs or symptoms of reduced airway protection/aspiration. She currently presents with oral phase dysphagia with sub optimal positioning and difficulty moving her head and neck- slow intake with puree. Pt with a preference at this time for liquids. Recommend D1/puree and thin liquids via straw sips, po medications whole in puree when Pt is alert and upright. Pt will need 1:1 feeder assist. SLP will follow during acute stay.   SLP Visit Diagnosis: Dysphagia, unspecified (R13.10)    Aspiration Risk  Mild aspiration risk    Diet Recommendation Dysphagia 1 (Puree);Thin liquid   Liquid Administration via: Straw Medication Administration: Whole meds with puree Supervision: Staff to assist with self feeding;Full supervision/cueing for compensatory strategies Compensations: Slow rate;Follow solids with liquid Postural Changes: Seated upright at 90  degrees;Remain upright for at least 30 minutes after po intake    Other  Recommendations Oral Care Recommendations: Oral care BID;Staff/trained caregiver to provide oral care Other Recommendations: Clarify dietary restrictions   Follow up Recommendations  24 hour supervision/assistance      Frequency and Duration min 2x/week  1 week       Prognosis Prognosis for Safe Diet Advancement: Fair      Swallow Study   General Date of Onset: 07/06/19 HPI: 86 year old female with a history of dementia, hypertension, IBS presenting with lethargy from [place].  According to the patient's niece, the patient is able to ambulate with a walker and is able to speak and carry on a conversation albeit she is confused at baseline.  She is normally able to feed herself but does require assistance with her other activities of daily living.  In addition, there was new onset left facial droop and left-sided weakness.  Since admission, MRI of the brain was obtained which showed no acute findings.  However neurology was consulted, and there was concern for TIA.  The patient was started on aspirin and Plavix.  Patient continued to be somnolent requiring assistance with feeding.  PT evaluated the patient and recommended skilled nursing facility.  She remained encephalopathic.  As result, oral intake remained poor. Pt passed [facility] swallow screen upon admission on 07/07/19, however Pt noted to hold food in mouth and gag yesterday so BSE requested.  Type of Study: Bedside Swallow Evaluation Previous Swallow Assessment: none on record Diet Prior to this Study: Regular;Thin liquids Temperature Spikes Noted: No Respiratory Status: Room air History of Recent Intubation: No Behavior/Cognition: Alert;Cooperative;Pleasant mood;Requires cueing Oral Cavity Assessment: Within Functional Limits Oral Care Completed by SLP: No Oral Cavity - Dentition: Adequate natural dentition Vision: Impaired for self-feeding Self-Feeding Abilities: Total assist Patient Positioning: Upright in bed (Pt with her head turned to L and difficult to move midline) Baseline Vocal Quality: Normal Volitional Cough: Weak Volitional Swallow: Able to elicit    Oral/Motor/Sensory Function Overall Oral  Motor/Sensory Function: Generalized oral weakness   Ice Chips Ice chips: Within functional limits Presentation: Spoon   Thin Liquid Thin Liquid: Impaired Presentation: Spoon;Straw;Cup Oral Phase Impairments: Reduced labial seal (better with straw, due to head positioning) Oral Phase Functional Implications: Right anterior spillage    Nectar Thick Nectar Thick Liquid: Not tested   Honey Thick Honey Thick Liquid: Not tested   Puree Puree: Within functional limits Presentation: Spoon   Solid     Solid: Not tested     Thank you,  Genene Churn, [place]  Goro Wenrick 07/18/2019,11:28 AM

## 2019-07-19 LAB — COMPREHENSIVE METABOLIC PANEL
ALT: 62 U/L — ABNORMAL HIGH (ref 0–44)
AST: 52 U/L — ABNORMAL HIGH (ref 15–41)
Albumin: 2.2 g/dL — ABNORMAL LOW (ref 3.5–5.0)
Alkaline Phosphatase: 65 U/L (ref 38–126)
Anion gap: 9 (ref 5–15)
BUN: 33 mg/dL — ABNORMAL HIGH (ref 8–23)
CO2: 23 mmol/L (ref 22–32)
Calcium: 8.6 mg/dL — ABNORMAL LOW (ref 8.9–10.3)
Chloride: 112 mmol/L — ABNORMAL HIGH (ref 98–111)
Creatinine, Ser: 0.7 mg/dL (ref 0.44–1.00)
GFR calc Af Amer: >60 mL/min (ref >60)
GFR calc non Af Amer: >60 mL/min (ref >60)
Glucose, Bld: 123 mg/dL — ABNORMAL HIGH (ref 70–99)
Potassium: 3.5 mmol/L (ref 3.5–5.1)
Sodium: 144 mmol/L (ref 135–145)
Total Bilirubin: 0.6 mg/dL (ref 0.3–1.2)
Total Protein: 6.1 g/dL — ABNORMAL LOW (ref 6.5–8.1)

## 2019-07-19 LAB — CBC WITH DIFFERENTIAL/PLATELET
Abs Immature Granulocytes: 0.37 10*3/uL — ABNORMAL HIGH (ref 0.00–0.07)
Basophils Absolute: 0.1 10*3/uL (ref 0.0–0.1)
Basophils Relative: 0 %
Eosinophils Absolute: 0 10*3/uL (ref 0.0–0.5)
Eosinophils Relative: 0 %
HCT: 29 % — ABNORMAL LOW (ref 36.0–46.0)
Hemoglobin: 9.2 g/dL — ABNORMAL LOW (ref 12.0–15.0)
Immature Granulocytes: 2 %
Lymphocytes Relative: 6 %
Lymphs Abs: 1.4 10*3/uL (ref 0.7–4.0)
MCH: 30.7 pg (ref 26.0–34.0)
MCHC: 31.7 g/dL (ref 30.0–36.0)
MCV: 96.7 fL (ref 80.0–100.0)
Monocytes Absolute: 1.8 10*3/uL — ABNORMAL HIGH (ref 0.1–1.0)
Monocytes Relative: 8 %
Neutro Abs: 19.6 10*3/uL — ABNORMAL HIGH (ref 1.7–7.7)
Neutrophils Relative %: 84 %
Platelets: 262 10*3/uL (ref 150–400)
RBC: 3 MIL/uL — ABNORMAL LOW (ref 3.87–5.11)
RDW: 13.2 % (ref 11.5–15.5)
WBC: 23.1 10*3/uL — ABNORMAL HIGH (ref 4.0–10.5)
nRBC: 0 % (ref 0.0–0.2)

## 2019-07-19 MED ORDER — ENSURE ENLIVE PO LIQD: 237.0000 mL | Freq: Three times a day (TID) | ORAL | Status: DC

## 2019-07-19 NOTE — Progress Notes (Signed)
  Speech Language Pathology Treatment: Dysphagia  Patient Details Name: Deborah Farrell MRN: [id number] DOB: [birth year] Today's Date: 07/19/2019 Time: 8264-1583 SLP Time Calculation (min) (ACUTE ONLY): 25 min  Assessment / Plan / Recommendation Clinical Impression  Pt seen for ongoing diagnostic dysphagia intervention. She is alert, but less responsive and less talkative today. RN was providing medications crushed in puree upon my arrival. Pt inconsistently followed commands. She presented with oral holding of all boluses and required verbal, visual, and tactile cues to manipulate bolus and swallow. She attempted to suck from the straw, however only able to pull into oral cavity x1 over multiple trials. She responded well to SLP finger occlusion to end of straw and having Pt suck from the other end. No overt signs of reduced airway protection, however intake was limited. Ok to continue offering puree and thin liquids when Pt is alert and upright, however suspect Pt unlikely to meet nutritional needs.    HPI HPI: 86 year old female with a history of dementia, hypertension, IBS presenting with lethargy from [place].  According to the patient's niece, the patient is able to ambulate with a walker and is able to speak and carry on a conversation albeit she is confused at baseline.  She is normally able to feed herself but does require assistance with her other activities of daily living.  In addition, there was new onset left facial droop and left-sided weakness.  Since admission, MRI of the brain was obtained which showed no acute findings.  However neurology was consulted, and there was concern for TIA.  The patient was started on aspirin and Plavix.  Patient continued to be somnolent requiring assistance with feeding.  PT evaluated the patient and recommended skilled nursing facility.  She remained encephalopathic.  As result, oral intake remained poor. Pt passed [facility] swallow screen upon admission on  07/07/19, however Pt noted to hold food in mouth and gag yesterday so BSE requested.       SLP Plan  Continue with current plan of care       Recommendations  Diet recommendations: Dysphagia 1 (puree);Thin liquid Liquids provided via: Straw;Teaspoon;Cup Medication Administration: Whole meds with puree Supervision: Full supervision/cueing for compensatory strategies Compensations: Slow rate;Follow solids with liquid Postural Changes and/or Swallow Maneuvers: Seated upright 90 degrees;Upright 30-60 min after meal                Oral Care Recommendations: Oral care BID;Staff/trained caregiver to provide oral care Follow up Recommendations: 24 hour supervision/assistance SLP Visit Diagnosis: Dysphagia, unspecified (R13.10) Plan: Continue with current plan of care       Thank you,  Genene Churn, [place]                 [place] 07/19/2019, 11:06 AM

## 2019-07-19 NOTE — TOC Progression Note (Signed)
Transition of Care [facility]) - Progression Note    Patient Details  Name: Deborah Farrell MRN: [id number] Date of Birth: 23-Apr-1917  Transition of Care [facility]) CM/SW Contact  Salome Arnt,  Phone Number: 07/19/2019, 10:36 AM  Clinical Narrative:  LCSW reviewed chart. Palliative care consult pending. TOC will restart SNF authorization tomorrow if appropriate. [employer] closed today due to holiday.        Barriers to Discharge: Barriers Resolved  Expected Discharge Plan and Services           Expected Discharge Date: 07/12/19                                     Social Determinants of Health (SDOH) Interventions    Readmission Risk Interventions No flowsheet data found.

## 2019-07-19 NOTE — Progress Notes (Signed)
Initial Nutrition Assessment  RD working remotely.  DOCUMENTATION CODES:   Not applicable  INTERVENTION:   - RD will follow for [place] discussions specifically regarding artifical nutrition given ongoing poor PO intake  - Ensure Enlive po TID, each supplement provides 350 kcal and 20 grams of protein  - Magic cup TID with meals, each supplement provides 290 kcal and 9 grams of protein  - Feeding assistance with all meals  NUTRITION DIAGNOSIS:   Inadequate oral intake related to lethargy/confusion, dysphagia as evidenced by meal completion < 25%.  GOAL:   Patient will meet greater than or equal to 90% of their needs  MONITOR:   PO intake, Labs, Weight trends, Supplement acceptance, Other (GOC)  REASON FOR ASSESSMENT:   Malnutrition Screening Tool    ASSESSMENT:   86 year old female who presented on 6/22 with AMS. PMH of dementia, HTN, IBS. Admitted with TIA.   Noted palliative consult for [place].  Spoke with RN via phone call. Pt is not eating much at all and is also not drinking much. Per RN, it is difficult to even get pt to take medications. Per SLP note, pt "presented with oral holding of all boluses and required verbal, visual, and tactile cues to manipulate bolus and swallow." Per SLP, pt unlikely to meet nutritional needs.  RD will follow for [place] discussions. In the meantime, will order oral nutrition supplements to aid pt in meeting kcal and protein needs.  Reviewed weight history in chart. Weight stable over the last 2 years between 61-65 kg.  Meal Completion: 0-65% x last 8 documented meals (averaging 8%)  Medications reviewed and include: IV abx IVF: NS with KCl @ 75 ml/hr  Labs reviewed: hemoglobin 9.2, elevated LFTs  NUTRITION - FOCUSED PHYSICAL EXAM:  Unable to complete at this time. RD working remotely.  Diet Order:   Diet Order            DIET - DYS 1 Room service appropriate? Yes; Fluid consistency: Thin  Diet effective now           Diet - low  sodium heart healthy                 EDUCATION NEEDS:   Not appropriate for education at this time  Skin:  Skin Assessment: Reviewed RN Assessment  Last BM:  07/17/19  Height:   Ht Readings from Last 1 Encounters:  07/06/19 5\' 6"  (1.676 m)    Weight:   Wt Readings from Last 1 Encounters:  07/06/19 64.6 kg    Ideal Body Weight:  59.1 kg  BMI:  Body mass index is 22.99 kg/m.  Estimated Nutritional Needs:   Kcal:  1300-1500  Protein:  70-80 grams  Fluid:  1.3-1.5 L    Gaynell Face, MS, RD, LDN Inpatient Clinical Dietitian Please see AMiON for contact information.

## 2019-07-19 NOTE — Progress Notes (Signed)
PROGRESS NOTE  ETTER ROYALL ZDG:[id number] DOB: [birth year] DOA: 07/06/2019 PCP: Celene Squibb, MD  HPI/Recap of past 92 hours: 86 year old female with a history of dementia, hypertension, IBS presenting with lethargy from [place]. According to the patient's niece, the patient is able to ambulate with a walker and is able to speak and carry on a conversation albeit she is confused at baseline. She is normally able to feed herself but does require assistance with her other activities of daily living. In addition, there was new onset left facial droop and left-sided weakness. Since admission, MRI of the brain was obtained which showed no acute findings. However neurology was consulted, and there was concern for TIA. The patient was started on aspirin and Plavix. Patient continued to be somnolent requiring assistance with feeding. PT evaluated the patient and recommended skilled nursing facility. She remained encephalopathic. As result, oral intake remained poor. Blood cultures showed Staph hominis.  She was started to IV vanco.  Repeat blood cultures remained negative, but her WBC count continued to rise.  She remained largely afebrile and she has been hemodynamically stable throughout the hospitalization.  CTs of chest/abd/pelvis were negative for source of infection.  Repeat urine cultures were negative.  She was started empirically on ceftriaxone and clinda for suspicion of aspiration pneumonitis, but WBC did not improve.  Goals of care discussion was held with family, but they wanted another 1-2 days to think about possible transition to comfort care and residential hospice.  Palliative medicine has subsequently been consulted.  07/19/19: Seen and examined at her bedside.  She is alert and minimally verbal.  She does not appear to be in distress.  On IV fluid hydration normal saline at 75 cc/h.  Ongoing discussion with palliative care team for goals of care.   Assessment/Plan: Principal  Problem:   AMS (altered mental status) Active Problems:   Essential hypertension   Irritable bowel syndrome   Dementia (HCC)   TIA (transient ischemic attack)   Carotid stenosis, bilateral   Leukocytosis   Bacteremia due to Gram-positive bacteria   Acute metabolic encephalopathy  TIA/facial droop/left-sided weakness -Secondary to TIA -MRI brain negative for any acute intracranial findings. -Patient seen by neurology-->recommended aspirin Plavix x3 months followed by Plavix monotherapy daily -Carotid ultrasound showed bilateral carotid occlusion, right-sided 70% and 50% left side. -Niece did not want any aggressive intervention  CoNS Bacteremia--Staph hominis -unclear source -repeat blood cultureneg -continue  empiric antibiotics IV vanco, Rocephin, IV clindamycin -07/07/19 Echo--no vegetation; EF 65-70%  Acute toxic/metabolic encephalopathy -Discontinue clonazepam -Continue empiric ceftriaxone -Personally reviewed chest x-ray--bibasilar opacities -Check ammonia--28 -Check serum V95--638 -Check folic VFIE--33.2 -TSH 9.518 -07/12/2019 UA--no pyuria -EEG--neg for seizure -mental status has wax and waned but overall has not made significant improvement  Persistent leukocytosis -remains hemodynamically stable -CT chest--no infiltrates, no effusions or edema -7/2 repeat blood culture neg -7/2 repeat UA-no pyuria -Leukocytosis is persistent, 23K. -CT abd/pelvis--neg for acute findings -Procalcitonin and lactic acid negative. -repeat blood culture negative to date -Urine culture negative. -no diarrhea  Essential hypertension -Continue ARB  Hyperlipidemia -Continue statin  Anxiety/depression -Holdingclonazepam -continue sertraline  Resolved post repletion: Hypokalemia -replete as indicated -check mag2.1  Goals of Care -Advance care planning, including the explanation and discussion of advance directives was carried out with the patient and family.  Code status including explanations of "Full Code" and "DNR" and alternatives were discussed in detail. Discussion of end-of-life issues including but not limited palliative care, hospice care and the concept of hospice,  other end-of-life care options, power of attorney for health care decisions, living wills, and physician orders for life-sustaining treatment were also discussed with the patient and family. Total face to face time 16 minutes. -confirmed DNR with niece -07/17/19--had goals of care discussion with niece. Discussed overall prognosis and unlikelihood of patient returning to pre-morbid level of function. Discussed continued decline clinically despite optimal care. Family to discuss options including residential hospice. Family does not want any invasive procedures but would like another 24-48 hours to observe patient's clinical course.     Status is: Inpatient  Remains inpatient appropriate becauseremains encephalopathic on IV abx   Dispo: The patient is from:ALF Anticipated d/c is to:SNF Anticipated d/c date is:07/20/2019. Patient currently is not medically stable to d/c.        Family Communication:Niece updated7/3  Consultants:neuro  Code Status: DNR  DVT Prophylaxis: [place] Lovenox daily.   Procedures: As Listed in Progress Note Above  Antibiotics: Ceftriaxone 6/29>>> vanco 7/1>>> clinda 7/3>>>    Objective: Vitals:   07/18/19 1354 07/18/19 2015 07/19/19 0440 07/19/19 0929  BP: (!) 177/80 (!) 163/99 (!) 158/91   Pulse: 92 99 92   Resp: 16 17 20    Temp: 98.7 F (37.1 C) 99.5 F (37.5 C) 99.1 F (37.3 C)   TempSrc: Oral  Oral   SpO2: 97% 97% 99% 100%  Weight:      Height:        Intake/Output Summary (Last 24 hours) at 07/19/2019 1039 Last data filed at 07/19/2019 0900 Gross per 24 hour  Intake 0 ml  Output 500 ml  Net -500 ml   Filed Weights   07/06/19 2315  Weight:  64.6 kg    Exam:  . 86 y.o. year-old female well developed well nourished in no acute distress. female well developed well nourished in no acute distress.  Alert and minimally verbal. . Cardiovascular: Regular rate and rhythm with no rubs or gallops.  No thyromegaly or JVD noted.   Marland Kitchen Respiratory: Clear to auscultation with no wheezes or rales. Good inspiratory effort. . Abdomen: Soft nontender nondistended with normal bowel sounds x4 quadrants. . Musculoskeletal: No lower extremity edema.  Marland Kitchen Psychiatry: Mood is appropriate for condition and setting   Data Reviewed: CBC: Recent Labs  Lab 07/15/19 0448 07/16/19 0500 07/17/19 0619 07/18/19 0543 07/19/19 0502  WBC 15.5* 19.4* 22.9* 25.1* 23.1*  NEUTROABS  --   --   --   --  19.6*  HGB 9.4* 9.1* 10.7* 9.7* 9.2*  HCT 29.6* 28.3* 32.5* 30.2* 29.0*  MCV 97.7 97.3 96.7 97.1 96.7  PLT 249 233 284 292 009   Basic Metabolic Panel: Recent Labs  Lab 07/14/19 0616 07/14/19 1415 07/15/19 0448 07/16/19 0500 07/17/19 0619 07/19/19 0502  NA 136  --  138 138 139 144  K 3.4*  --  3.5 3.5 3.5 3.5  CL 101  --  105 107 106 112*  CO2 23  --  23 21* 21* 23  GLUCOSE 131*  --  122* 119* 142* 123*  BUN 25*  --  28* 30* 29* 33*  CREATININE 0.75  --  0.72 0.70 0.76 0.70  CALCIUM 8.7*  --  8.6* 8.5* 8.8* 8.6*  MG  --  2.1  --  2.0  --   --    GFR: Estimated Creatinine Clearance: 35.9 mL/min (by C-G formula based on SCr of 0.7 mg/dL). Liver Function Tests: Recent Labs  Lab 07/14/19 0616 07/17/19 0619 07/19/19 0502  AST 24 36 52*  ALT 25 36 62*  ALKPHOS  58 68 65  BILITOT 0.7 0.8 0.6  PROT 6.8 6.8 6.1*  ALBUMIN 3.1* 2.7* 2.2*   No results for input(s): LIPASE, AMYLASE in the last 168 hours. Recent Labs  Lab 07/14/19 1415  AMMONIA 28   Coagulation Profile: No results for input(s): INR, PROTIME in the last 168 hours. Cardiac Enzymes: No results for input(s): CKTOTAL, CKMB, CKMBINDEX, TROPONINI in the last 168 hours. BNP (last 3 results) No results for input(s): PROBNP in the  last 8760 hours. HbA1C: No results for input(s): HGBA1C in the last 72 hours. CBG: No results for input(s): GLUCAP in the last 168 hours. Lipid Profile: No results for input(s): CHOL, HDL, LDLCALC, TRIG, CHOLHDL, LDLDIRECT in the last 72 hours. Thyroid Function Tests: No results for input(s): TSH, T4TOTAL, FREET4, T3FREE, THYROIDAB in the last 72 hours. Anemia Panel: No results for input(s): VITAMINB12, FOLATE, FERRITIN, TIBC, IRON, RETICCTPCT in the last 72 hours. Urine analysis:    Component Value Date/Time   COLORURINE YELLOW 07/16/2019 1133   APPEARANCEUR CLEAR 07/16/2019 1133   LABSPEC 1.019 07/16/2019 1133   PHURINE 5.0 07/16/2019 1133   GLUCOSEU NEGATIVE 07/16/2019 1133   HGBUR MODERATE (A) 07/16/2019 1133   BILIRUBINUR NEGATIVE 07/16/2019 1133   KETONESUR NEGATIVE 07/16/2019 1133   PROTEINUR 100 (A) 07/16/2019 1133   UROBILINOGEN 1.0 11/02/2011 2208   NITRITE NEGATIVE 07/16/2019 1133   LEUKOCYTESUR NEGATIVE 07/16/2019 1133   Sepsis Labs: @LABRCNTIP (procalcitonin:4,lacticidven:4)  ) Recent Results (from the past 240 hour(s))  SARS Coronavirus 2 by RT PCR (hospital order, performed in [facility] hospital lab) Nasopharyngeal Nasopharyngeal Swab     Status: None   Collection Time: 07/12/19 11:56 AM   Specimen: Nasopharyngeal Swab  Result Value Ref Range Status   SARS Coronavirus 2 NEGATIVE NEGATIVE Final    Comment: (NOTE) SARS-CoV-2 target nucleic acids are NOT DETECTED.  The SARS-CoV-2 RNA is generally detectable in upper and lower respiratory specimens during the acute phase of infection. The lowest concentration of SARS-CoV-2 viral copies this assay can detect is 250 copies / mL. A negative result does not preclude SARS-CoV-2 infection and should not be used as the sole basis for treatment or other patient management decisions.  A negative result may occur with improper specimen collection / handling, submission of specimen other than nasopharyngeal swab,  presence of viral mutation(s) within the areas targeted by this assay, and inadequate number of viral copies (<250 copies / mL). A negative result must be combined with clinical observations, patient history, and epidemiological information.  Fact Sheet for Patients:   StrictlyIdeas.no  Fact Sheet for Healthcare Providers: BankingDealers.co.za  This test is not yet approved or  cleared by the [place] FDA and has been authorized for detection and/or diagnosis of SARS-CoV-2 by FDA under an Emergency Use Authorization (EUA).  This EUA will remain in effect (meaning this test can be used) for the duration of the COVID-19 declaration under Section 564(b)(1) of the Act, 21 U.S.C. section 360bbb-3(b)(1), unless the authorization is terminated or revoked sooner.  Performed at [facility], [address], [place], [place] [address]   Culture, blood (routine x 2)     Status: Abnormal   Collection Time: 07/13/19  9:48 PM   Specimen: BLOOD RIGHT HAND  Result Value Ref Range Status   Specimen Description   Final    BLOOD RIGHT HAND Performed at [facility], [address], [place], [place] [address]    Special Requests   Final    BOTTLES DRAWN AEROBIC AND  ANAEROBIC Blood Culture adequate volume Performed at [facility], [address], [place], [place] [address]    Culture  Setup Time   Final    GRAM POSITIVE COCCI IN BOTH AEROBIC AND ANAEROBIC BOTTLES Gram Stain Report Called to,Read Back By and Verified With:  TAYLOR,M [id number] 07/14/2019 BY TJONES Performed at [facility], [address] [place], [place] [address]    Culture (A)  Final    STAPHYLOCOCCUS HOMINIS SUSCEPTIBILITIES PERFORMED ON PREVIOUS CULTURE WITHIN THE LAST 5 DAYS. Performed at [facility], [place] [address], [place], [place] [address]    Report Status 07/16/2019 FINAL  Final  Culture, blood (routine x 2)     Status: Abnormal   Collection Time: 07/13/19  9:48 PM     Specimen: BLOOD  Result Value Ref Range Status   Specimen Description   Final    BLOOD BLOOD RIGHT WRIST Performed at [facility], [address], [place], [place] [address]    Special Requests   Final    BOTTLES DRAWN AEROBIC AND ANAEROBIC Blood Culture adequate volume Performed at [facility], [address] [place], [place] [address]    Culture  Setup Time   Final    GRAM POSITIVE COCCI ANAEROBIC BOTTLE Gram Stain Report Called to,Read Back By and Verified With: Ardis Hughs 07/14/2019 BY TJONES Alex CRITICAL RESULT CALLED TO, READ BACK BY AND VERIFIED WITH: Geni Bers RN 07/14/19 1906 JDW Performed at [facility], [place] [address], [place], [place] [address]    Culture STAPHYLOCOCCUS HOMINIS (A)  Final   Report Status 07/16/2019 FINAL  Final   Organism ID, Bacteria STAPHYLOCOCCUS HOMINIS  Final      Susceptibility   Staphylococcus hominis - MIC*    CIPROFLOXACIN 2 INTERMEDIATE Intermediate     ERYTHROMYCIN >=8 RESISTANT Resistant     GENTAMICIN <=0.5 SENSITIVE Sensitive     OXACILLIN 0.5 RESISTANT Resistant     TETRACYCLINE >=16 RESISTANT Resistant     VANCOMYCIN 1 SENSITIVE Sensitive     TRIMETH/SULFA <=10 SENSITIVE Sensitive     CLINDAMYCIN <=0.25 SENSITIVE Sensitive     RIFAMPIN <=0.5 SENSITIVE Sensitive     Inducible Clindamycin NEGATIVE Sensitive     * STAPHYLOCOCCUS HOMINIS  Blood Culture ID Panel (Reflexed)     Status: Abnormal   Collection Time: 07/13/19  9:48 PM  Result Value Ref Range Status   Enterococcus species NOT DETECTED NOT DETECTED Final   Listeria monocytogenes NOT DETECTED NOT DETECTED Final   Staphylococcus species DETECTED (A) NOT DETECTED Final    Comment: Methicillin (oxacillin) resistant coagulase negative staphylococcus. Possible blood culture contaminant (unless isolated from more than one blood culture draw or clinical case suggests pathogenicity). No antibiotic treatment is indicated for blood  culture contaminants. CRITICAL  RESULT CALLED TO, READ BACK BY AND VERIFIED WITH: Geni Bers RN 07/14/19 1906 JDW    Staphylococcus aureus (BCID) NOT DETECTED NOT DETECTED Final   Methicillin resistance DETECTED (A) NOT DETECTED Final    Comment: CRITICAL RESULT CALLED TO, READ BACK BY AND VERIFIED WITH: Geni Bers RN 07/14/19 1906 JDW    Streptococcus species NOT DETECTED NOT DETECTED Final   Streptococcus agalactiae NOT DETECTED NOT DETECTED Final   Streptococcus pneumoniae NOT DETECTED NOT DETECTED Final   Streptococcus pyogenes NOT DETECTED NOT DETECTED Final   Acinetobacter baumannii NOT DETECTED NOT DETECTED Final   Enterobacteriaceae species NOT DETECTED NOT DETECTED Final   Enterobacter cloacae complex NOT DETECTED NOT DETECTED Final  Escherichia coli NOT DETECTED NOT DETECTED Final   Klebsiella oxytoca NOT DETECTED NOT DETECTED Final   Klebsiella pneumoniae NOT DETECTED NOT DETECTED Final   Proteus species NOT DETECTED NOT DETECTED Final   Serratia marcescens NOT DETECTED NOT DETECTED Final   Haemophilus influenzae NOT DETECTED NOT DETECTED Final   Neisseria meningitidis NOT DETECTED NOT DETECTED Final   Pseudomonas aeruginosa NOT DETECTED NOT DETECTED Final   Candida albicans NOT DETECTED NOT DETECTED Final   Candida glabrata NOT DETECTED NOT DETECTED Final   Candida krusei NOT DETECTED NOT DETECTED Final   Candida parapsilosis NOT DETECTED NOT DETECTED Final   Candida tropicalis NOT DETECTED NOT DETECTED Final    Comment: Performed at [facility], [place] [address] [place], [place] [address]  Culture, Urine     Status: Abnormal   Collection Time: 07/14/19  3:54 AM   Specimen: Urine, Clean Catch  Result Value Ref Range Status   Specimen Description   Final    URINE, CLEAN CATCH Performed at [facility], [address] [place], [place] [address]    Special Requests   Final    NONE Performed at [facility], [address], [place], [place] [address]    Culture 30,000 COLONIES/mL KLEBSIELLA  PNEUMONIAE (A)  Final   Report Status 07/17/2019 FINAL  Final   Organism ID, Bacteria KLEBSIELLA PNEUMONIAE (A)  Final      Susceptibility   Klebsiella pneumoniae - MIC*    AMPICILLIN RESISTANT Resistant     CEFAZOLIN <=4 SENSITIVE Sensitive     CEFTRIAXONE <=0.25 SENSITIVE Sensitive     CIPROFLOXACIN <=0.25 SENSITIVE Sensitive     GENTAMICIN <=1 SENSITIVE Sensitive     IMIPENEM <=0.25 SENSITIVE Sensitive     NITROFURANTOIN 32 SENSITIVE Sensitive     TRIMETH/SULFA <=20 SENSITIVE Sensitive     AMPICILLIN/SULBACTAM 4 SENSITIVE Sensitive     PIP/TAZO <=4 SENSITIVE Sensitive     * 30,000 COLONIES/mL KLEBSIELLA PNEUMONIAE  Culture, blood (Routine X 2) w Reflex to ID Panel     Status: None (Preliminary result)   Collection Time: 07/16/19  5:00 AM   Specimen: BLOOD RIGHT WRIST  Result Value Ref Range Status   Specimen Description   Final    BLOOD RIGHT WRIST BOTTLES DRAWN AEROBIC AND ANAEROBIC   Special Requests Blood Culture adequate volume  Final   Culture   Final    NO GROWTH 3 DAYS Performed at [facility], [address] [place], [place] [address]    Report Status PENDING  Incomplete  Culture, blood (Routine X 2) w Reflex to ID Panel     Status: None (Preliminary result)   Collection Time: 07/16/19  5:05 AM   Specimen: BLOOD RIGHT HAND  Result Value Ref Range Status   Specimen Description   Final    BLOOD RIGHT HAND BOTTLES DRAWN AEROBIC AND ANAEROBIC   Special Requests Blood Culture adequate volume  Final   Culture   Final    NO GROWTH 3 DAYS Performed at [facility], [address] [place], [place] [address]    Report Status PENDING  Incomplete  Culture, Urine     Status: None   Collection Time: 07/16/19 11:33 AM   Specimen: Urine, Clean Catch  Result Value Ref Range Status   Specimen Description   Final    URINE, CLEAN CATCH Performed at [facility], [address] [place], [place] [address]    Special Requests   Final    NONE Performed  at [facility],  [address] [place], [place] [address]    Culture   Final    NO GROWTH Performed at [facility], [place] [address] [place], [place] [address]    Report Status 07/17/2019 FINAL  Final      Studies: US Venous Img Upper Uni Left (DVT)  Result Date: 07/18/2019 CLINICAL DATA:  86 year old female with a history of left arm edema EXAM: LEFT UPPER EXTREMITY VENOUS DOPPLER ULTRASOUND TECHNIQUE: Gray-scale sonography with graded compression, as well as color Doppler and duplex ultrasound were performed to evaluate the upper extremity deep venous system from the level of the subclavian vein and including the jugular, axillary, basilic, radial, ulnar and upper cephalic vein. Spectral Doppler was utilized to evaluate flow at rest and with distal augmentation maneuvers. COMPARISON:  None. FINDINGS: Contralateral Subclavian Vein: Respiratory phasicity is normal and symmetric with the symptomatic side. No evidence of thrombus. Normal compressibility. Internal Jugular Vein: Not imaged Subclavian Vein: No evidence of thrombus. Normal compressibility, respiratory phasicity and response to augmentation. Axillary Vein: No evidence of thrombus. Normal compressibility, respiratory phasicity and response to augmentation. Cephalic Vein: No evidence of thrombus. Normal compressibility, respiratory phasicity and response to augmentation. Basilic Vein: No evidence of thrombus. Normal compressibility, respiratory phasicity and response to augmentation. Brachial Veins: No evidence of thrombus. Normal compressibility, respiratory phasicity and response to augmentation. Radial Veins: No evidence of thrombus. Normal compressibility, respiratory phasicity and response to augmentation. Ulnar Veins: No evidence of thrombus. Normal compressibility, respiratory phasicity and response to augmentation. Other Findings:  None visualized. IMPRESSION: Sonographic survey of the left upper extremity negative for DVT Electronically Signed   By:  Corrie Mckusick D.O.   On: 07/18/2019 11:24    Scheduled Meds: . aspirin EC  81 mg Oral Daily  . atorvastatin  40 mg Oral Daily  . clopidogrel  75 mg Oral Daily  . enoxaparin (LOVENOX) injection  40 mg Subcutaneous Q24H  . irbesartan  37.5 mg Oral Daily  . mirabegron ER  25 mg Oral QHS  . sertraline  100 mg Oral Daily    Continuous Infusions: . 0.9 % NaCl with KCl 20 mEq / L 75 mL/hr at 07/18/19 2018  . cefTRIAXone (ROCEPHIN)  IV 1 g (07/18/19 2011)  . clindamycin (CLEOCIN) IV 600 mg (07/19/19 0553)  . vancomycin 750 mg (07/18/19 1108)     LOS: 10 days     Kayleen Memos, MD [facility] Pager [phone]  If 7PM-7AM, please contact night-coverage [email or web address] Password [facility] 07/19/2019, 10:39 AM

## 2019-07-20 DIAGNOSIS — Z515 Encounter for palliative care: Secondary | ICD-10-CM

## 2019-07-20 DIAGNOSIS — R7881 Bacteremia: Secondary | ICD-10-CM

## 2019-07-20 DIAGNOSIS — F0390 Unspecified dementia without behavioral disturbance: Secondary | ICD-10-CM

## 2019-07-20 DIAGNOSIS — Z7189 Other specified counseling: Secondary | ICD-10-CM

## 2019-07-20 LAB — COMPREHENSIVE METABOLIC PANEL
ALT: 59 U/L — ABNORMAL HIGH (ref 0–44)
AST: 50 U/L — ABNORMAL HIGH (ref 15–41)
Albumin: 2.3 g/dL — ABNORMAL LOW (ref 3.5–5.0)
Alkaline Phosphatase: 68 U/L (ref 38–126)
Anion gap: 13 (ref 5–15)
BUN: 30 mg/dL — ABNORMAL HIGH (ref 8–23)
CO2: 18 mmol/L — ABNORMAL LOW (ref 22–32)
Calcium: 8.3 mg/dL — ABNORMAL LOW (ref 8.9–10.3)
Chloride: 115 mmol/L — ABNORMAL HIGH (ref 98–111)
Creatinine, Ser: 0.66 mg/dL (ref 0.44–1.00)
GFR calc Af Amer: >60 mL/min (ref >60)
GFR calc non Af Amer: >60 mL/min (ref >60)
Glucose, Bld: 106 mg/dL — ABNORMAL HIGH (ref 70–99)
Potassium: 3.5 mmol/L (ref 3.5–5.1)
Sodium: 146 mmol/L — ABNORMAL HIGH (ref 135–145)
Total Bilirubin: 0.7 mg/dL (ref 0.3–1.2)
Total Protein: 5.8 g/dL — ABNORMAL LOW (ref 6.5–8.1)

## 2019-07-20 LAB — CBC WITH DIFFERENTIAL/PLATELET
Abs Immature Granulocytes: 0.42 10*3/uL — ABNORMAL HIGH (ref 0.00–0.07)
Basophils Absolute: 0.1 10*3/uL (ref 0.0–0.1)
Basophils Relative: 0 %
Eosinophils Absolute: 0 10*3/uL (ref 0.0–0.5)
Eosinophils Relative: 0 %
HCT: 27.7 % — ABNORMAL LOW (ref 36.0–46.0)
Hemoglobin: 8.8 g/dL — ABNORMAL LOW (ref 12.0–15.0)
Immature Granulocytes: 2 %
Lymphocytes Relative: 6 %
Lymphs Abs: 1.4 10*3/uL (ref 0.7–4.0)
MCH: 31.2 pg (ref 26.0–34.0)
MCHC: 31.8 g/dL (ref 30.0–36.0)
MCV: 98.2 fL (ref 80.0–100.0)
Monocytes Absolute: 1.8 10*3/uL — ABNORMAL HIGH (ref 0.1–1.0)
Monocytes Relative: 8 %
Neutro Abs: 18.3 10*3/uL — ABNORMAL HIGH (ref 1.7–7.7)
Neutrophils Relative %: 84 %
RBC: 2.82 MIL/uL — ABNORMAL LOW (ref 3.87–5.11)
RDW: 13.8 % (ref 11.5–15.5)
WBC: 22 10*3/uL — ABNORMAL HIGH (ref 4.0–10.5)
nRBC: 0 % (ref 0.0–0.2)

## 2019-07-20 LAB — GLUCOSE, CAPILLARY
Glucose-Capillary: 101 mg/dL — ABNORMAL HIGH (ref 70–99)
Glucose-Capillary: 109 mg/dL — ABNORMAL HIGH (ref 70–99)

## 2019-07-20 LAB — PHOSPHORUS: Phosphorus: 3.6 mg/dL (ref 2.5–4.6)

## 2019-07-20 LAB — MAGNESIUM: Magnesium: 2.4 mg/dL (ref 1.7–2.4)

## 2019-07-20 MED ORDER — HALOPERIDOL 0.5 MG PO TABS: 0.5000 mg | ORAL_TABLET | ORAL | Status: DC | PRN

## 2019-07-20 MED ORDER — ACETAMINOPHEN 650 MG RE SUPP: 650.0000 mg | Freq: Four times a day (QID) | RECTAL | Status: DC | PRN

## 2019-07-20 MED ORDER — POLYVINYL ALCOHOL 1.4 % OP SOLN: 1.0000 [drp] | Freq: Four times a day (QID) | OPHTHALMIC | Status: DC | PRN

## 2019-07-20 MED ORDER — ONDANSETRON 4 MG PO TBDP: 4.0000 mg | ORAL_TABLET | Freq: Four times a day (QID) | ORAL | Status: DC | PRN

## 2019-07-20 MED ORDER — SODIUM CHLORIDE 0.9% FLUSH
3.0000 mL | Freq: Three times a day (TID) | INTRAVENOUS | Status: DC
  Administered 2019-07-20 – 2019-07-21 (×2): 3 mL via INTRAVENOUS

## 2019-07-20 MED ORDER — BISACODYL 10 MG RE SUPP: 10.0000 mg | Freq: Every day | RECTAL | Status: DC | PRN

## 2019-07-20 MED ORDER — MORPHINE SULFATE (CONCENTRATE) 10 MG/0.5ML PO SOLN
2.5000 mg | ORAL | Status: DC | PRN
  Administered 2019-07-21: 2.6 mg via SUBLINGUAL
  Filled 2019-07-20: qty 0.5

## 2019-07-20 MED ORDER — GLYCOPYRROLATE 1 MG PO TABS: 1.0000 mg | ORAL_TABLET | ORAL | Status: DC | PRN

## 2019-07-20 MED ORDER — ACETAMINOPHEN 325 MG PO TABS: 650.0000 mg | ORAL_TABLET | Freq: Four times a day (QID) | ORAL | Status: DC | PRN

## 2019-07-20 MED ORDER — FERROUS SULFATE 325 (65 FE) MG PO TABS
325.0000 mg | ORAL_TABLET | Freq: Every day | ORAL | Status: DC
  Filled 2019-07-20: qty 1

## 2019-07-20 MED ORDER — BIOTENE DRY MOUTH MT LIQD: 15.0000 mL | OROMUCOSAL | Status: DC | PRN

## 2019-07-20 MED ORDER — METRONIDAZOLE IN NACL 5-0.79 MG/ML-% IV SOLN
500.0000 mg | Freq: Three times a day (TID) | INTRAVENOUS | Status: DC
  Administered 2019-07-20: 500 mg via INTRAVENOUS
  Filled 2019-07-20: qty 100

## 2019-07-20 MED ORDER — AMLODIPINE BESYLATE 5 MG PO TABS: 2.5000 mg | ORAL_TABLET | Freq: Every day | ORAL | Status: DC

## 2019-07-20 MED ORDER — GLYCOPYRROLATE 0.2 MG/ML IJ SOLN: 0.2000 mg | INTRAMUSCULAR | Status: DC | PRN

## 2019-07-20 MED ORDER — MORPHINE SULFATE (PF) 2 MG/ML IV SOLN: 1.0000 mg | INTRAVENOUS | Status: DC | PRN

## 2019-07-20 MED ORDER — SACCHAROMYCES BOULARDII 250 MG PO CAPS
250.0000 mg | ORAL_CAPSULE | Freq: Two times a day (BID) | ORAL | Status: DC
  Filled 2019-07-20: qty 1

## 2019-07-20 MED ORDER — ONDANSETRON HCL 4 MG/2ML IJ SOLN: 4.0000 mg | Freq: Four times a day (QID) | INTRAMUSCULAR | Status: DC | PRN

## 2019-07-20 MED ORDER — HALOPERIDOL LACTATE 5 MG/ML IJ SOLN: 0.5000 mg | INTRAMUSCULAR | Status: DC | PRN

## 2019-07-20 MED ORDER — HALOPERIDOL LACTATE 2 MG/ML PO CONC
0.5000 mg | ORAL | Status: DC | PRN
  Filled 2019-07-20: qty 0.3

## 2019-07-20 MED ORDER — SENNA 8.6 MG PO TABS
1.0000 | ORAL_TABLET | Freq: Every day | ORAL | Status: DC
  Filled 2019-07-20: qty 1

## 2019-07-20 MED ORDER — SODIUM CHLORIDE 0.9% FLUSH: 3.0000 mL | INTRAVENOUS | Status: DC | PRN

## 2019-07-20 NOTE — Progress Notes (Signed)
   07/20/19 1038  Assess: MEWS Score  Temp 99.9 F (37.7 C)  BP (!) 175/65  Pulse Rate 87  Resp 16  SpO2 96 %  O2 Device Room Air  Assess: MEWS Score  MEWS Temp 0  MEWS Systolic 0  MEWS Pulse 0  MEWS RR 0  MEWS LOC 1  MEWS Score 1  MEWS Score Color Green  Assess: if the MEWS score is Yellow or Red  Were vital signs taken at a resting state? Yes  Focused Assessment Documented focused assessment  Early Detection of Sepsis Score *See Row Information* Medium  MEWS guidelines implemented *See Row Information* Yes  Treat  MEWS Interventions Other (Comment) (continue to monitor)

## 2019-07-20 NOTE — Consult Note (Signed)
Consultation Note Date: 07/20/2019   Patient Name: Deborah Farrell  DOB: January 02, 1918  MRN: [id number]  Age / Sex: 86 y.o., female  PCP: Celene Squibb, MD Referring Physician: Kayleen Memos, DO  Reason for Consultation: Establishing goals of care  HPI/Patient Profile: 86 y.o. female  with past medical history of dementia, HTN, IBS, glaucoma admitted on 07/06/2019 from [facility] with altered mental status with reported left facial droop and left sided weakness. Baseline reportedly walks with walker and is incontinent but able to carry conversation and feed herself. MRI negative for infarct. Carotid ultrasound with bilateral occlusion right 70% and left 50% (no intervention per family). Goals of care conversations initiated and family considering transition to comfort but desired watchful waiting over past couple days. Main concern is she is less responsive and interactive today per notes and with ongoing poor intake on dysphagia 1, thin liquid diet. Blood cultures were positive for Staph hominis with most recent cultures negative.   Clinical Assessment and Goals of Care: I met today at [name] but no family present. I attempted to awaken [name] but she had no response when I called her name or when I tapped and shook her shoulder. I even lifted her eyelids to see if she would arouse but she just grimaced. Note she is warm to touch and lower extremities with edema.   I called and spoke with niece, Izora Gala. Izora Gala asks how her aunt is doing today and I explained the above neurological assessment. Izora Gala is not surprised and shares that this is how [name] has mostly been over the past few days. Izora Gala shares that she and her family have discussed and they are prepared to move forward with hospice placement and comfort care. We discussed that we would stop lab sticks, diagnostics, and antibiotics and Izora Gala  understands and knows that [name] is not benefiting from these interventions at this time. She wants to ensure her aunt if comfortable and does not suffer so comfort medications will be put in place. I explained that I would notify social work and that they and hospice will be in touch with her about this transition.   All questions/concerns addressed. Emotional support provided. Discussed plan with Dr. Nevada Crane, Marcene Brawn CSW, Barbaraann Rondo.   Primary Decision Maker NEXT OF KIN niece Izora Gala    SUMMARY OF RECOMMENDATIONS   - Comfort care - Hopeful for placement at hospice facility in [place] with [facility]   Code Status/Advance Care Planning:  DNR   Symptom Management:   Orders changed to reflect full comfort care. PRN medications in place to ensure comfort.   Palliative Prophylaxis:   Aspiration, Bowel Regimen, Delirium Protocol, Frequent Pain Assessment and Turn Reposition  Additional Recommendations (Limitations, Scope, Preferences):  Full Comfort Care  Psycho-social/Spiritual:   Desire for further Chaplaincy support:yes  Additional Recommendations: Education on Hospice and Grief/Bereavement Support  Prognosis:   < 2 weeks  Discharge Planning: Hospice facility      Primary Diagnoses: Present on Admission: .  AMS (altered mental status) . Dementia ([place]) . Essential hypertension . Irritable bowel syndrome . TIA (transient ischemic attack) . Carotid stenosis, bilateral   I have reviewed the medical record, interviewed the patient and family, and examined the patient. The following aspects are pertinent.  Past Medical History:  Diagnosis Date  . Allergy history, radiographic dye   . Arthritis   . Dysuria   . Glaucoma   . HTN (hypertension)   . IBS (irritable bowel syndrome)    Non-ulcer dyspepsia; hemorrhoids  . Overactive bladder   . 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 Urinary frequency   . Urinary tract infection    Social History   Socioeconomic History  . Marital status: Widowed    Spouse name: Not on file  . Number of children: Not on file  . Years of education: Not on file  . Highest education level: Not on file  Occupational History  . Occupation: retired    [employer] farm manager RETIRED  Tobacco Use  . Smoking status: Never Smoker  . Smokeless tobacco: Never Used  Vaping Use  . Vaping Use: Never used  Substance and Sexual Activity  . Alcohol use: No  . Drug use: No  . Sexual activity: Never    Birth control/protection: Surgical    Comment: hyst  Other Topics Concern  . Not on file  Social History Narrative  . Not on file   Social Determinants of Health   Financial Resource Strain:   . Difficulty of Paying Living Expenses:   Food Insecurity:   . Worried About RCharity fundraiserin the Last Year:   . RArboriculturistin the Last Year:   Transportation Needs:   . LFilm/video editor(Medical):   .Marland KitchenLack of Transportation (Non-Medical):   Physical Activity:   . Days of Exercise per Week:   . Minutes of Exercise per Session:   Stress:   . Feeling of Stress :   Social Connections:   . Frequency of Communication with Friends and Family:   . Frequency of Social Gatherings with Friends and Family:   . Attends Religious Services:   . Active Member of Clubs or Organizations:   . Attends CArchivistMeetings:   .Marland KitchenMarital Status:    Family History  Problem Relation Age of Onset  . Stroke Mother   . Heart attack Sister   . Heart disease Sister   . Cancer Maternal Grandfather    Scheduled Meds: . aspirin EC  81 mg Oral Daily  . atorvastatin  40 mg Oral Daily  . clopidogrel  75 mg Oral Daily  . enoxaparin (LOVENOX) injection  40 mg Subcutaneous Q24H  . feeding supplement (ENSURE ENLIVE)  237 mL Oral TID BM  . ferrous sulfate  325 mg Oral Q breakfast  . irbesartan  37.5 mg Oral  Daily  . mirabegron ER  25 mg Oral QHS  . saccharomyces boulardii  250 mg Oral BID  . senna  1 tablet Oral Daily  . sertraline  100 mg Oral Daily   Continuous Infusions: . 0.9 % NaCl with KCl 20 mEq / L 75 mL/hr at 07/20/19 0432  . cefTRIAXone (ROCEPHIN)  IV 1 g (07/19/19 1945)  . metronidazole 500 mg (07/20/19 0847)  . vancomycin 750 mg (07/19/19 1322)   PRN Meds:.acetaminophen, acetaminophen, polyethylene glycol Allergies  Allergen Reactions  .  Tramadol Anaphylaxis  . Aciphex [Rabeprazole] Other (See Comments)    intolerance  . Codeine     Unknown-listed on current MAR  . Colchicine     Unknown-listed on current MAR  . Dicyclomine Hcl Other (See Comments)    Unknown  . Hyoscyamine Sulfate Other (See Comments)    Unknown  . Iodinated Diagnostic Agents   . Nexium [Esomeprazole Magnesium] Other (See Comments)    G.I. Upset  . Omeprazole Other (See Comments)    GI upset  . Sulfonamide Derivatives Other (See Comments)    Unknown   Review of Systems  Unable to perform ROS: Acuity of condition    Physical Exam Vitals and nursing note reviewed.  Constitutional:      Appearance: She is ill-appearing.     Comments: Elderly, frail   Cardiovascular:     Rate and Rhythm: Normal rate.  Pulmonary:     Effort: Pulmonary effort is normal. No tachypnea, accessory muscle usage or respiratory distress.  Abdominal:     General: Abdomen is flat.     Palpations: Abdomen is soft.  Neurological:     Mental Status: She is unresponsive.     Comments: Only grimace to painful stimuli     Vital Signs: BP (!) 155/82 (BP Location: Right Arm)   Pulse 92   Temp (!) 101.3 F (38.5 C) (Axillary)   Resp 20   Ht _0  (1.676 m)   Wt 64.6 kg   SpO2 95%   BMI 22.99 kg/m  Pain Scale: PAINAD   Pain Score: 0-No pain   SpO2: SpO2: 95 % O2 Device:SpO2: 95 % O2 Flow Rate: .   IO: Intake/output summary:   Intake/Output Summary (Last 24 hours) at 07/20/2019 1029 Last data filed at  07/20/2019 0900 Gross per 24 hour  Intake 3093.06 ml  Output 1350 ml  Net 1743.06 ml    LBM: Last BM Date: 07/18/19 Baseline Weight: Weight: 64.6 kg Most recent weight: Weight: 64.6 kg     Palliative Assessment/Data:     Time In/Out: 1030-1045, 1300-1320 Time Total: 35 min Greater than 50%  of this time was spent counseling and coordinating care related to the above assessment and plan.  Signed by: Vinie Sill, NP Palliative Medicine Team Pager # [phone] (M-F 8a-5p) Team Phone # [phone] (Nights/Weekends)

## 2019-07-20 NOTE — TOC Progression Note (Signed)
Transition of Care [facility]) - Progression Note    Patient Details  Name: COLEY LITTLES MRN: [id number] Date of Birth: [birth year]  [facility] CM/SW Contact  Salome Arnt, [place] Phone Number: 07/20/2019, 3:16 PM  Clinical Narrative:   LCSW notified by palliative that family would like to pursue comfort care and Hospice Home. Referral made and discussed with Cassandra who will follow up with LCSW after contacting family.       Barriers to Discharge: Barriers Resolved  Expected Discharge Plan and Services           Expected Discharge Date: 07/12/19                                     Social Determinants of Health (SDOH) Interventions    Readmission Risk Interventions No flowsheet data found.

## 2019-07-20 NOTE — Progress Notes (Signed)
PROGRESS NOTE  Deborah Farrell UXN:[id number] DOB: 1917/06/16 DOA: 07/06/2019 PCP: Celene Squibb, MD  HPI/Recap of past 12 hours: 86 year old female with a history of dementia, hypertension, IBS presenting with lethargy from [place]. According to the patient's niece, the patient is able to ambulate with a walker and is able to speak and carry on a conversation albeit she is confused at baseline. She is normally able to feed herself but does require assistance with her other activities of daily living. In addition, there was new onset left facial droop and left-sided weakness. Since admission, MRI of the brain was obtained which showed no acute findings. However neurology was consulted, and there was concern for TIA. The patient was started on aspirin and Plavix. Patient continued to be somnolent requiring assistance with feeding. PT evaluated the patient and recommended skilled nursing facility. She remained encephalopathic. As result, oral intake remained poor. Blood cultures showed Staph hominis.  She was started to IV vanco.  Repeat blood cultures remained negative, but her WBC count continued to rise.  She remained largely afebrile and she has been hemodynamically stable throughout the hospitalization.  CTs of chest/abd/pelvis were negative for source of infection.  Repeat urine cultures were negative.  She was started empirically on ceftriaxone and clinda for suspicion of aspiration pneumonitis, but WBC did not improve.  Goals of care discussion was held with family, but they wanted another 1-2 days to think about possible transition to comfort care and residential hospice.  Palliative medicine has subsequently been consulted.  07/20/19: Seen and examined at her bedside.  She is somnolent but arousable to voices.  Fever earlier this morning T-max of 101.3.  Blood cultures obtained peripherally.  Added IV Flagyl.  Continue to follow cultures.  Assessment/Plan: Principal Problem:   AMS  (altered mental status) Active Problems:   Essential hypertension   Irritable bowel syndrome   Dementia (HCC)   TIA (transient ischemic attack)   Carotid stenosis, bilateral   Leukocytosis   Bacteremia due to Gram-positive bacteria   Acute metabolic encephalopathy  TIA/facial droop/left-sided weakness -Secondary to TIA -MRI brain negative for any acute intracranial findings. -Patient seen by neurology-->recommended aspirin Plavix x3 months followed by Plavix monotherapy daily -Carotid ultrasound showed bilateral carotid occlusion, right-sided 70% and 50% left side. -Niece did not want any aggressive intervention  Sepsis-unclear etiology, recurrent fevers, persistent leukocytosis Obtain blood cultures peripherally x2 Follow cultures Urine culture done on 07/16/2019 - Obtain chest x-ray Continue empiric IV antibiotics, IV vancomycin, Rocephin Add IV Flagyl on 07/20/2018  CoNS Bacteremia--Staph hominis -unclear source -07/07/19 Echo--no vegetation; EF 65-70%  Acute toxic/metabolic encephalopathy -Discontinue clonazepam -Check ammonia--28 -Check serum U54--270 -Check folic WCBJ--62.8 -TSH 3.151 -07/12/2019 UA--no pyuria -EEG--neg for seizure -mental status has wax and waned but overall has not made significant improvement  Leukocytosis, persistent, unclear etiology -Fever this morning with T-max of 101.3. -Slowly downtrending 22K from 23K from 25K -CT chest--no lobular infiltrates -7/2 repeat blood culture neg -7/2 repeat UA-no pyuria, urine culture negative -CT abd/pelvis--neg for acute findings -Procalcitonin and lactic acid negative. -no diarrhea  Essential hypertension -BP not at goal -Continue home ARB -Add Norvasc 2.5 mg daily  Hyperlipidemia -Continue statin  Anxiety/depression -Holdingclonazepam -continue sertraline  Resolved post repletion: Hypokalemia -replete as indicated -check mag2.4  Goals of Care -Advance care planning, including the  explanation and discussion of advance directives was carried out with the patient and family. Code status including explanations of "Full Code" and "DNR" and alternatives were discussed in detail.  Discussion of end-of-life issues including but not limited palliative care, hospice care and the concept of hospice, other end-of-life care options, power of attorney for health care decisions, living wills, and physician orders for life-sustaining treatment were also discussed with the patient and family. Total face to face time 16 minutes. -confirmed DNR with niece -07/17/19--had goals of care discussion with niece. Discussed overall prognosis and unlikelihood of patient returning to pre-morbid level of function. Discussed continued decline clinically despite optimal care. Family to discuss options including residential hospice. Family does not want any invasive procedures but would like another 24-48 hours to observe patient's clinical course.     Status is: Inpatient  Remains inpatient appropriate becauseremains encephalopathic on IV abx   Dispo: The patient is from:ALF Anticipated d/c is to:SNF Anticipated d/c date is:07/22/2019. Patient currently is not medically stable to d/c.        Family Communication:Niece updated7/3  Consultants:neuro  Code Status: DNR  DVT Prophylaxis: [place] Lovenox daily.   Procedures: As Listed in Progress Note Above  Antibiotics: Ceftriaxone 6/29>>> vanco 7/1>>> clinda 7/3>>> 07/19/2019 IV Flagyl 07/20/2019>>    Objective: Vitals:   07/20/19 0537 07/20/19 0904 07/20/19 1038 07/20/19 1153  BP: (!) 155/82  (!) 175/65 (!) 179/74  Pulse: 92  87 91  Resp: 20  16 16   Temp: (!) 101.3 F (38.5 C)  99.9 F (37.7 C) 99.5 F (37.5 C)  TempSrc: Axillary  Oral Oral  SpO2: 96% 95% 96% 96%  Weight:      Height:        Intake/Output Summary (Last 24 hours) at 07/20/2019 1251 Last  data filed at 07/20/2019 1154 Gross per 24 hour  Intake 3093.06 ml  Output 1550 ml  Net 1543.06 ml   Filed Weights   07/06/19 2315  Weight: 64.6 kg    Exam:  . General: 86 y.o. year-old female frail-appearing, somnolent but arousable to voices.  . Cardiovascular: Regular rate and rhythm no rubs or gallops.  Marland Kitchen Respiratory: Clear to auscultation no wheezes or rales.  Poor inspiratory effort.  . Abdomen: Soft nontender normal bowel sounds present.   . Musculoskeletal: No lower extremity edema bilaterally.   Marland Kitchen Psychiatry: Unable to assess mood due to somnolence.  Data Reviewed: CBC: Recent Labs  Lab 07/16/19 0500 07/17/19 0619 07/18/19 0543 07/19/19 0502 07/20/19 0443  WBC 19.4* 22.9* 25.1* 23.1* 22.0*  NEUTROABS  --   --   --  19.6* 18.3*  HGB 9.1* 10.7* 9.7* 9.2* 8.8*  HCT 28.3* 32.5* 30.2* 29.0* 27.7*  MCV 97.3 96.7 97.1 96.7 98.2  PLT 233 284 292 262 PLATELET CLUMPING, SUGGEST RECOLLECTION OF SAMPLE IN CITRATE TUBE.   Basic Metabolic Panel: Recent Labs  Lab 07/14/19 0616 07/14/19 1415 07/15/19 0448 07/16/19 0500 07/17/19 0619 07/19/19 0502 07/20/19 0443  NA   < >  --  138 138 139 144 146*  K   < >  --  3.5 3.5 3.5 3.5 3.5  CL   < >  --  105 107 106 112* 115*  CO2   < >  --  23 21* 21* 23 18*  GLUCOSE   < >  --  122* 119* 142* 123* 106*  BUN   < >  --  28* 30* 29* 33* 30*  CREATININE   < >  --  0.72 0.70 0.76 0.70 0.66  CALCIUM   < >  --  8.6* 8.5* 8.8* 8.6* 8.3*  MG  --  2.1  --  2.0  --   --  2.4  PHOS  --   --   --   --   --   --  3.6   < > = values in this interval not displayed.   GFR: Estimated Creatinine Clearance: 35.9 mL/min (by C-G formula based on SCr of 0.66 mg/dL). Liver Function Tests: Recent Labs  Lab 07/14/19 0616 07/17/19 0619 07/19/19 0502 07/20/19 0443  AST 24 36 52* 50*  ALT 25 36 62* 59*  ALKPHOS 58 68 65 68  BILITOT 0.7 0.8 0.6 0.7  PROT 6.8 6.8 6.1* 5.8*  ALBUMIN 3.1* 2.7* 2.2* 2.3*   No results for input(s): LIPASE,  AMYLASE in the last 168 hours. Recent Labs  Lab 07/14/19 1415  AMMONIA 28   Coagulation Profile: No results for input(s): INR, PROTIME in the last 168 hours. Cardiac Enzymes: No results for input(s): CKTOTAL, CKMB, CKMBINDEX, TROPONINI in the last 168 hours. BNP (last 3 results) No results for input(s): PROBNP in the last 8760 hours. HbA1C: No results for input(s): HGBA1C in the last 72 hours. CBG: Recent Labs  Lab 07/20/19 0736 07/20/19 1139  GLUCAP 101* 109*   Lipid Profile: No results for input(s): CHOL, HDL, LDLCALC, TRIG, CHOLHDL, LDLDIRECT in the last 72 hours. Thyroid Function Tests: No results for input(s): TSH, T4TOTAL, FREET4, T3FREE, THYROIDAB in the last 72 hours. Anemia Panel: No results for input(s): VITAMINB12, FOLATE, FERRITIN, TIBC, IRON, RETICCTPCT in the last 72 hours. Urine analysis:    Component Value Date/Time   COLORURINE YELLOW 07/16/2019 1133   APPEARANCEUR CLEAR 07/16/2019 1133   LABSPEC 1.019 07/16/2019 1133   PHURINE 5.0 07/16/2019 1133   GLUCOSEU NEGATIVE 07/16/2019 1133   HGBUR MODERATE (A) 07/16/2019 1133   BILIRUBINUR NEGATIVE 07/16/2019 1133   KETONESUR NEGATIVE 07/16/2019 1133   PROTEINUR 100 (A) 07/16/2019 1133   UROBILINOGEN 1.0 11/02/2011 2208   NITRITE NEGATIVE 07/16/2019 1133   LEUKOCYTESUR NEGATIVE 07/16/2019 1133   Sepsis Labs: @LABRCNTIP (procalcitonin:4,lacticidven:4)  ) Recent Results (from the past 240 hour(s))  SARS Coronavirus 2 by RT PCR (hospital order, performed in [facility] hospital lab) Nasopharyngeal Nasopharyngeal Swab     Status: None   Collection Time: 07/12/19 11:56 AM   Specimen: Nasopharyngeal Swab  Result Value Ref Range Status   SARS Coronavirus 2 NEGATIVE NEGATIVE Final    Comment: (NOTE) SARS-CoV-2 target nucleic acids are NOT DETECTED.  The SARS-CoV-2 RNA is generally detectable in upper and lower respiratory specimens during the acute phase of infection. The lowest concentration of SARS-CoV-2  viral copies this assay can detect is 250 copies / mL. A negative result does not preclude SARS-CoV-2 infection and should not be used as the sole basis for treatment or other patient management decisions.  A negative result may occur with improper specimen collection / handling, submission of specimen other than nasopharyngeal swab, presence of viral mutation(s) within the areas targeted by this assay, and inadequate number of viral copies (<250 copies / mL). A negative result must be combined with clinical observations, patient history, and epidemiological information.  Fact Sheet for Patients:   StrictlyIdeas.no  Fact Sheet for Healthcare Providers: BankingDealers.co.za  This test is not yet approved or  cleared by the [place] FDA and has been authorized for detection and/or diagnosis of SARS-CoV-2 by FDA under an Emergency Use Authorization (EUA).  This EUA will remain in effect (meaning this test can be used) for the duration of the COVID-19 declaration under Section 564(b)(1) of the Act, 21 U.S.C.  section 360bbb-3(b)(1), unless the authorization is terminated or revoked sooner.  Performed at [facility], [address], [place], [place] [address]   Culture, blood (routine x 2)     Status: Abnormal   Collection Time: 07/13/19  9:48 PM   Specimen: BLOOD RIGHT HAND  Result Value Ref Range Status   Specimen Description   Final    BLOOD RIGHT HAND Performed at [facility], [address], [place], [place] [address]    Special Requests   Final    BOTTLES DRAWN AEROBIC AND ANAEROBIC Blood Culture adequate volume Performed at [facility], [address] [place], [place] [address]    Culture  Setup Time   Final    GRAM POSITIVE COCCI IN BOTH AEROBIC AND ANAEROBIC BOTTLES Gram Stain Report Called to,Read Back By and Verified With:  TAYLOR,M [id number] 07/14/2019 BY TJONES Performed at [facility], [address] [place], [place]  [address]    Culture (A)  Final    STAPHYLOCOCCUS HOMINIS SUSCEPTIBILITIES PERFORMED ON PREVIOUS CULTURE WITHIN THE LAST 5 DAYS. Performed at [facility], [place] [address] [place], [place] [address]    Report Status 07/16/2019 FINAL  Final  Culture, blood (routine x 2)     Status: Abnormal   Collection Time: 07/13/19  9:48 PM   Specimen: BLOOD  Result Value Ref Range Status   Specimen Description   Final    BLOOD BLOOD RIGHT WRIST Performed at [facility], [address], [place], [place] [address]    Special Requests   Final    BOTTLES DRAWN AEROBIC AND ANAEROBIC Blood Culture adequate volume Performed at [facility], [address], [place], [place] [address]    Culture  Setup Time   Final    GRAM POSITIVE COCCI ANAEROBIC BOTTLE Gram Stain Report Called to,Read Back By and Verified With: Ardis Hughs 07/14/2019 BY TJONES [place] CRITICAL RESULT CALLED TO, READ BACK BY AND VERIFIED WITH: Geni Bers RN 07/14/19 1906 JDW Performed at [facility], [place] [address] [place], [place] [address]    Culture STAPHYLOCOCCUS HOMINIS (A)  Final   Report Status 07/16/2019 FINAL  Final   Organism ID, Bacteria STAPHYLOCOCCUS HOMINIS  Final      Susceptibility   Staphylococcus hominis - MIC*    CIPROFLOXACIN 2 INTERMEDIATE Intermediate     ERYTHROMYCIN >=8 RESISTANT Resistant     GENTAMICIN <=0.5 SENSITIVE Sensitive     OXACILLIN 0.5 RESISTANT Resistant     TETRACYCLINE >=16 RESISTANT Resistant     VANCOMYCIN 1 SENSITIVE Sensitive     TRIMETH/SULFA <=10 SENSITIVE Sensitive     CLINDAMYCIN <=0.25 SENSITIVE Sensitive     RIFAMPIN <=0.5 SENSITIVE Sensitive     Inducible Clindamycin NEGATIVE Sensitive     * STAPHYLOCOCCUS HOMINIS  Blood Culture ID Panel (Reflexed)     Status: Abnormal   Collection Time: 07/13/19  9:48 PM  Result Value Ref Range Status   Enterococcus species NOT DETECTED NOT DETECTED Final   Listeria monocytogenes NOT DETECTED NOT DETECTED Final   Staphylococcus  species DETECTED (A) NOT DETECTED Final    Comment: Methicillin (oxacillin) resistant coagulase negative staphylococcus. Possible blood culture contaminant (unless isolated from more than one blood culture draw or clinical case suggests pathogenicity). No antibiotic treatment is indicated for blood  culture contaminants. CRITICAL RESULT CALLED TO, READ BACK BY AND VERIFIED WITH: Geni Bers RN 07/14/19 1906 JDW    Staphylococcus aureus (BCID) NOT DETECTED NOT DETECTED Final  Methicillin resistance DETECTED (A) NOT DETECTED Final    Comment: CRITICAL RESULT CALLED TO, READ BACK BY AND VERIFIED WITH: Geni Bers RN 07/14/19 1906 JDW    Streptococcus species NOT DETECTED NOT DETECTED Final   Streptococcus agalactiae NOT DETECTED NOT DETECTED Final   Streptococcus pneumoniae NOT DETECTED NOT DETECTED Final   Streptococcus pyogenes NOT DETECTED NOT DETECTED Final   Acinetobacter baumannii NOT DETECTED NOT DETECTED Final   Enterobacteriaceae species NOT DETECTED NOT DETECTED Final   Enterobacter cloacae complex NOT DETECTED NOT DETECTED Final   Escherichia coli NOT DETECTED NOT DETECTED Final   Klebsiella oxytoca NOT DETECTED NOT DETECTED Final   Klebsiella pneumoniae NOT DETECTED NOT DETECTED Final   Proteus species NOT DETECTED NOT DETECTED Final   Serratia marcescens NOT DETECTED NOT DETECTED Final   Haemophilus influenzae NOT DETECTED NOT DETECTED Final   Neisseria meningitidis NOT DETECTED NOT DETECTED Final   Pseudomonas aeruginosa NOT DETECTED NOT DETECTED Final   Candida albicans NOT DETECTED NOT DETECTED Final   Candida glabrata NOT DETECTED NOT DETECTED Final   Candida krusei NOT DETECTED NOT DETECTED Final   Candida parapsilosis NOT DETECTED NOT DETECTED Final   Candida tropicalis NOT DETECTED NOT DETECTED Final    Comment: Performed at [facility], [place]. [address] [place], [place] [address]  Culture, Urine     Status: Abnormal   Collection Time: 07/14/19  3:54 AM   Specimen:  Urine, Clean Catch  Result Value Ref Range Status   Specimen Description   Final    URINE, CLEAN CATCH Performed at [facility], [address], [place], [place] [address]    Special Requests   Final    NONE Performed at [facility], [address], [place], [place] [address]    Culture 30,000 COLONIES/mL KLEBSIELLA PNEUMONIAE (A)  Final   Report Status 07/17/2019 FINAL  Final   Organism ID, Bacteria KLEBSIELLA PNEUMONIAE (A)  Final      Susceptibility   Klebsiella pneumoniae - MIC*    AMPICILLIN RESISTANT Resistant     CEFAZOLIN <=4 SENSITIVE Sensitive     CEFTRIAXONE <=0.25 SENSITIVE Sensitive     CIPROFLOXACIN <=0.25 SENSITIVE Sensitive     GENTAMICIN <=1 SENSITIVE Sensitive     IMIPENEM <=0.25 SENSITIVE Sensitive     NITROFURANTOIN 32 SENSITIVE Sensitive     TRIMETH/SULFA <=20 SENSITIVE Sensitive     AMPICILLIN/SULBACTAM 4 SENSITIVE Sensitive     PIP/TAZO <=4 SENSITIVE Sensitive     * 30,000 COLONIES/mL KLEBSIELLA PNEUMONIAE  Culture, blood (Routine X 2) w Reflex to ID Panel     Status: None (Preliminary result)   Collection Time: 07/16/19  5:00 AM   Specimen: BLOOD RIGHT WRIST  Result Value Ref Range Status   Specimen Description   Final    BLOOD RIGHT WRIST BOTTLES DRAWN AEROBIC AND ANAEROBIC   Special Requests Blood Culture adequate volume  Final   Culture   Final    NO GROWTH 4 DAYS Performed at [facility], [address], [place], [place] [address]    Report Status PENDING  Incomplete  Culture, blood (Routine X 2) w Reflex to ID Panel     Status: None (Preliminary result)   Collection Time: 07/16/19  5:05 AM   Specimen: BLOOD RIGHT HAND  Result Value Ref Range Status   Specimen Description   Final    BLOOD RIGHT HAND BOTTLES DRAWN AEROBIC AND ANAEROBIC   Special Requests Blood Culture adequate volume  Final   Culture  Final    NO GROWTH 4 DAYS Performed at [facility], [address], [place], [place] [address]    Report Status PENDING  Incomplete    Culture, Urine     Status: None   Collection Time: 07/16/19 11:33 AM   Specimen: Urine, Clean Catch  Result Value Ref Range Status   Specimen Description   Final    URINE, CLEAN CATCH Performed at [facility], [address], [place], [place] [address]    Special Requests   Final    NONE Performed at [facility], [address] [place], [place] [address]    Culture   Final    NO GROWTH Performed at [facility], [place] [address] [place], [place] [address]    Report Status 07/17/2019 FINAL  Final  Culture, blood (routine x 2)     Status: None (Preliminary result)   Collection Time: 07/20/19  7:26 AM   Specimen: Right Antecubital; Blood  Result Value Ref Range Status   Specimen Description RIGHT ANTECUBITAL  Final   Special Requests   Final    BOTTLES DRAWN AEROBIC AND ANAEROBIC Blood Culture adequate volume Performed at [facility], [address], [place], [place] [address]    Culture PENDING  Incomplete   Report Status PENDING  Incomplete  Culture, blood (routine x 2)     Status: None (Preliminary result)   Collection Time: 07/20/19  7:26 AM   Specimen: BLOOD RIGHT HAND  Result Value Ref Range Status   Specimen Description BLOOD RIGHT HAND  Final   Special Requests   Final    BOTTLES DRAWN AEROBIC AND ANAEROBIC Blood Culture adequate volume Performed at [facility], [address], [place], [place] [address]    Culture PENDING  Incomplete   Report Status PENDING  Incomplete      Studies: No results found.  Scheduled Meds: . aspirin EC  81 mg Oral Daily  . atorvastatin  40 mg Oral Daily  . clopidogrel  75 mg Oral Daily  . enoxaparin (LOVENOX) injection  40 mg Subcutaneous Q24H  . feeding supplement (ENSURE ENLIVE)  237 mL Oral TID BM  . ferrous sulfate  325 mg Oral Q breakfast  . irbesartan  37.5 mg Oral Daily  . mirabegron ER  25 mg Oral QHS  . saccharomyces boulardii  250 mg Oral BID  . senna  1 tablet Oral Daily  . sertraline  100 mg Oral Daily     Continuous Infusions: . 0.9 % NaCl with KCl 20 mEq / L 75 mL/hr at 07/20/19 0432  . cefTRIAXone (ROCEPHIN)  IV 1 g (07/19/19 1945)  . metronidazole 500 mg (07/20/19 0847)  . vancomycin 750 mg (07/19/19 1322)     LOS: 11 days     Kayleen Memos, MD [facility] Pager [phone]  If 7PM-7AM, please contact night-coverage [email or web address] Password [email or web address] 07/20/2019, 12:51 PM

## 2019-07-21 DIAGNOSIS — K582 Mixed irritable bowel syndrome: Secondary | ICD-10-CM

## 2019-07-21 LAB — CULTURE, BLOOD (ROUTINE X 2)
Culture: NO GROWTH
Culture: NO GROWTH
Special Requests: ADEQUATE
Special Requests: ADEQUATE

## 2019-07-21 LAB — SARS CORONAVIRUS 2 BY RT PCR (HOSPITAL ORDER, PERFORMED IN ~~LOC~~ HOSPITAL LAB): SARS Coronavirus 2: NEGATIVE

## 2019-07-21 NOTE — Discharge Summary (Addendum)
Physician Discharge Summary  Deborah Farrell GYI:[id number] DOB: 1917/10/02 DOA: 07/06/2019  PCP: Celene Squibb, MD  Admit date: 07/06/2019 Discharge date: 07/21/2019  Disposition: HOSPICE   Recommendations for Outpatient   SYMPTOM MANAGEMENT PER HOSPICE PROTOCOL  Discharge Condition: HOSPICE    CODE STATUS: DNR    Brief Hospitalization Summary: Please see all hospital notes, images, labs for full details of the hospitalization. ADMISSION HPI: Deborah Farrell is a 86 y.o. female with medical history significant for dementia, hypertension, IBS.  Patient was brought to the ED via EMS with reports of altered mental status, patient unable to walk and talk as per her baseline.  Also reports of left facial droop and left-sided weakness.  Last known normal was more than 30 hours ago. At the time of my evaluation, patient is awake alert, able to tell me her name, answer a few simple questions, but she does not know why she is in the hospital.  Per nursing home staff, patient seemed weak and not ambulatory, patient had seem like this since Sunday night.  Patient was evaluated by the supervising doctor today and referred to the ED. Patient tells me she intermittently has pain with urination.  At baseline, patient is incontinent, and ambulatory with a walker.  ED Course: Systolic blood pressure 350K to 170s, otherwise stable vitals.  Unremarkable CMP.  Hemoglobin 10.4 at baseline.  Head CT without acute abnormality.  Telemetry neurology was consulted, patient was slow, but no focal deficits was appreciated.  Stroke work-up with MRI head, MRA head and neck recommended, echocardiogram.  Hospitalist to admit.  HPI/Recap of past 3 hours: 86 year old female with a history of dementia, hypertension, IBS presenting with lethargy from [place]. According to the patient's niece, the patient is able to ambulate with a walker and is able to speak and carry on a conversation albeit she is confused at baseline.  She is normally able to feed herself but does require assistance with her other activities of daily living. In addition, there was new onset left facial droop and left-sided weakness. Since admission, MRI of the brain was obtained which showed no acute findings. However neurology was consulted, and there was concern for TIA. The patient was started on aspirin and Plavix. Patient continued to be somnolent requiring assistance with feeding. PT evaluated the patient and recommended skilled nursing facility. She remained encephalopathic. As result, oral intake remained poor. Blood cultures showed Staph hominis. She was started to IV vanco. Repeat blood cultures remained negative, but her WBC count continued to rise. She remained largely afebrile and she has been hemodynamically stable throughout the hospitalization. CTs of chest/abd/pelvis were negative for source of infection. Repeat urine cultures were negative. She was started empirically on ceftriaxone and clinda for suspicion of aspiration pneumonitis, but WBC did not improve. Goals of care discussion was held with family, but they wanted another 1-2 days to think about possible transition to comfort care and residential hospice. Palliative medicine has subsequently been consulted and requested full comfort care and residential hospice placement.   Assessment/Plan: Principal Problem:   AMS (altered mental status) Active Problems:   Essential hypertension   Irritable bowel syndrome   Dementia (HCC)   TIA (transient ischemic attack)   Carotid stenosis, bilateral   Leukocytosis   Bacteremia due to Gram-positive bacteria   Acute metabolic encephalopathy  TIA/facial droop/left-sided weakness FULL COMFORT CARE   Sepsis-unclear etiology, recurrent fevers, persistent leukocytosis FULL COMFORT CARE   CoNS Bacteremia--Staph hominis FULL COMFORT CARE  Acute toxic/metabolic encephalopathy FULL COMFORT CARE   Leukocytosis,  persistent, unclear etiology FULL COMFORT CARE   Essential hypertension FULL COMFORT CARE   Hyperlipidemia FULL COMFORT CARE   Anxiety/depression FULL COMFORT CARE   Resolved post repletion: Hypokalemia   Goals of Care -Advance care planning, including the explanation and discussion of advance directives was carried out with the patient and family. Code status including explanations of "Full Code" and "DNR" and alternatives were discussed in detail. Discussion of end-of-life issues including but not limited palliative care, hospice care and the concept of hospice, other end-of-life care options, power of attorney for health care decisions, living wills, and physician orders for life-sustaining treatment were also discussed with the patient and family. Total face to face time 16 minutes. -confirmed DNR with niece -07/17/19--had goals of care discussion with niece. Discussed overall prognosis and unlikelihood of patient returning to pre-morbid level of function. Discussed continued decline clinically despite optimal care. Family DECIDED FOR FULL COMFORT CARE AND RESIDENTIAL HOSPICE.    Discharge Diagnoses:  Principal Problem:   AMS (altered mental status) Active Problems:   Essential hypertension   Irritable bowel syndrome   Dementia (HCC)   TIA (transient ischemic attack)   Carotid stenosis, bilateral   Leukocytosis   Bacteremia due to Gram-positive bacteria   Acute metabolic encephalopathy   Goals of care, counseling/discussion   Encounter for hospice care discussion   Palliative care by specialist   Discharge Instructions:  Allergies as of 07/21/2019      Reactions   Tramadol Anaphylaxis   Aciphex [rabeprazole] Other (See Comments)   intolerance   Codeine    Unknown-listed on current MAR   Colchicine    Unknown-listed on current MAR   Dicyclomine Hcl Other (See Comments)   Unknown   Hyoscyamine Sulfate Other (See Comments)   Unknown   Iodinated  Diagnostic Agents    Nexium [esomeprazole Magnesium] Other (See Comments)   G.I. Upset   Omeprazole Other (See Comments)   GI upset   Sulfonamide Derivatives Other (See Comments)   Unknown      Medication List    STOP taking these medications   acetaminophen 650 MG CR tablet Commonly known as: TYLENOL   ALIGN PO   BIOFREEZE EX   carboxymethylcellulose 0.5 % Soln Commonly known as: REFRESH PLUS   CENTRUM SILVER PO   clonazePAM 0.5 MG tablet Commonly known as: KLONOPIN   ferrous sulfate 325 (65 FE) MG tablet   FUSION PLUS PO   Lasix 20 MG tablet Generic drug: furosemide   latanoprost 0.005 % ophthalmic solution Commonly known as: XALATAN   magnesium hydroxide 400 MG/5ML suspension Commonly known as: MILK OF MAGNESIA   Myrbetriq 25 MG Tb24 tablet Generic drug: mirabegron ER   olmesartan 5 MG tablet Commonly known as: BENICAR   ondansetron 4 MG tablet Commonly known as: ZOFRAN   pantoprazole 40 MG tablet Commonly known as: PROTONIX   polyethylene glycol powder 17 GM/SCOOP powder Commonly known as: GLYCOLAX/MIRALAX   Preparation H 50 % Pads   prochlorperazine 5 MG tablet Commonly known as: COMPAZINE   sertraline 100 MG tablet Commonly known as: ZOLOFT       Contact information for after-discharge care    Destination    [facility] .   Service: Skilled Nursing Contact information: [address] [address] [phone]                 Allergies  Allergen Reactions  .  Tramadol Anaphylaxis  . Aciphex [Rabeprazole] Other (See Comments)    intolerance  . Codeine     Unknown-listed on current MAR  . Colchicine     Unknown-listed on current MAR  . Dicyclomine Hcl Other (See Comments)    Unknown  . Hyoscyamine Sulfate Other (See Comments)    Unknown  . Iodinated Diagnostic Agents   . Nexium [Esomeprazole Magnesium] Other (See Comments)    G.I. Upset  . Omeprazole Other (See Comments)     GI upset  . Sulfonamide Derivatives Other (See Comments)    Unknown   Allergies as of 07/21/2019      Reactions   Tramadol Anaphylaxis   Aciphex [rabeprazole] Other (See Comments)   intolerance   Codeine    Unknown-listed on current MAR   Colchicine    Unknown-listed on current MAR   Dicyclomine Hcl Other (See Comments)   Unknown   Hyoscyamine Sulfate Other (See Comments)   Unknown   Iodinated Diagnostic Agents    Nexium [esomeprazole Magnesium] Other (See Comments)   G.I. Upset   Omeprazole Other (See Comments)   GI upset   Sulfonamide Derivatives Other (See Comments)   Unknown      Medication List    STOP taking these medications   acetaminophen 650 MG CR tablet Commonly known as: TYLENOL   ALIGN PO   BIOFREEZE EX   carboxymethylcellulose 0.5 % Soln Commonly known as: REFRESH PLUS   CENTRUM SILVER PO   clonazePAM 0.5 MG tablet Commonly known as: KLONOPIN   ferrous sulfate 325 (65 FE) MG tablet   FUSION PLUS PO   Lasix 20 MG tablet Generic drug: furosemide   latanoprost 0.005 % ophthalmic solution Commonly known as: XALATAN   magnesium hydroxide 400 MG/5ML suspension Commonly known as: MILK OF MAGNESIA   Myrbetriq 25 MG Tb24 tablet Generic drug: mirabegron ER   olmesartan 5 MG tablet Commonly known as: BENICAR   ondansetron 4 MG tablet Commonly known as: ZOFRAN   pantoprazole 40 MG tablet Commonly known as: PROTONIX   polyethylene glycol powder 17 GM/SCOOP powder Commonly known as: GLYCOLAX/MIRALAX   Preparation H 50 % Pads   prochlorperazine 5 MG tablet Commonly known as: COMPAZINE   sertraline 100 MG tablet Commonly known as: ZOLOFT       Procedures/Studies: CT ABDOMEN PELVIS WO CONTRAST  Result Date: 07/16/2019 CLINICAL DATA:  Abdominal pain, leukocytosis, altered mental status EXAM: CT ABDOMEN AND PELVIS WITHOUT CONTRAST TECHNIQUE: Multidetector CT imaging of the abdomen and pelvis was performed following the standard protocol  without IV contrast. COMPARISON:  02/05/2018 FINDINGS: Lower chest: Bandlike scarring and or atelectasis of the included bilateral lung bases. Three-vessel coronary artery calcifications and stents. Hepatobiliary: No focal liver abnormality is seen. Status post cholecystectomy. No biliary dilatation. Pancreas: Unremarkable. No pancreatic ductal dilatation or surrounding inflammatory changes. Spleen: Normal in size without significant abnormality. Adrenals/Urinary Tract: Adrenal glands are unremarkable. Kidneys are normal, without renal calculi, solid lesion, or hydronephrosis. Bladder is unremarkable. Stomach/Bowel: Stomach is within normal limits. Appendix is surgically absent no evidence of bowel wall thickening, distention, or inflammatory changes. Occasional sigmoid diverticula. Vascular/Lymphatic: Aortic atherosclerosis. No enlarged abdominal or pelvic lymph nodes. Reproductive: Status post hysterectomy. Other: No abdominal wall hernia or abnormality. No abdominopelvic ascites. Musculoskeletal: No acute or significant osseous findings. IMPRESSION: 1. No non-contrast CT findings of the abdomen or pelvis to explain abdominal pain or leukocytosis. 2. Status post cholecystectomy, appendectomy, and hysterectomy. 3. Coronary artery disease.  Aortic Atherosclerosis (ICD10-I70.0). Electronically Signed  By: Eddie Candle M.D.   On: 07/16/2019 20:59   CT Head Wo Contrast  Result Date: 07/06/2019 CLINICAL DATA:  Cognitive decline. Stroke suspected. Focal neuro deficit. EXAM: CT HEAD WITHOUT CONTRAST TECHNIQUE: Contiguous axial images were obtained from the base of the skull through the vertex without intravenous contrast. COMPARISON:  01/01/2019 FINDINGS: Brain: Oblique imaging plane. There is no evidence for acute hemorrhage, hydrocephalus, mass lesion, or abnormal extra-axial fluid collection. No definite CT evidence for acute infarction. Diffuse loss of parenchymal volume is consistent with atrophy. Patchy low  attenuation in the deep hemispheric and periventricular white matter is nonspecific, but likely reflects chronic microvascular ischemic demyelination. Vascular: No hyperdense vessel or unexpected calcification. Skull: No evidence for fracture. No worrisome lytic or sclerotic lesion. Sinuses/Orbits: The visualized paranasal sinuses and mastoid air cells are clear. Visualized portions of the globes and intraorbital fat are unremarkable. Other: None. IMPRESSION: 1. No acute intracranial abnormality. 2. Atrophy with chronic small vessel white matter ischemic disease. Electronically Signed   By: Misty Stanley M.D.   On: 07/06/2019 17:36   CT CHEST WO CONTRAST  Result Date: 07/14/2019 CLINICAL DATA:  Cough with abnormal chest radiograph. Evaluate for pneumonia, pleural effusion or abscess. EXAM: CT CHEST WITHOUT CONTRAST TECHNIQUE: Multidetector CT imaging of the chest was performed following the standard protocol without IV contrast. COMPARISON:  Chest radiograph 07/12/2019 FINDINGS: Cardiovascular: Cardiac enlargement. Aortic atherosclerosis. Three vessel coronary artery atherosclerotic calcifications. Mediastinum/Nodes: Normal appearance of the thyroid gland. The trachea appears patent and is midline. Normal appearance of the esophagus. No enlarged mediastinal, supraclavicular, or axillary lymph nodes. Lungs/Pleura: Lung window detail is diminished by respiratory motion artifact. No significant pleural effusion. Mild posterior pleural thickening is identified bilaterally which is favored to represent dependent changes. No airspace consolidation, atelectasis or pneumothorax. No findings of a pulmonary abscess. Scar identified within the posterolateral right lower lobe. No suspicious pulmonary nodule or mass identified Upper Abdomen: No acute abnormality within the limited images through the upper abdomen. Musculoskeletal: Spondylosis noted within the thoracic spine. No acute or suspicious osseous abnormality.  IMPRESSION: 1. No acute cardiopulmonary abnormalities. No evidence for pulmonary abscess, pleural effusion, or pneumonia. 2. Cardiac enlargement, aortic atherosclerosis and 3 vessel coronary artery atherosclerotic calcifications. Aortic Atherosclerosis (ICD10-I70.0). Electronically Signed   By: Kerby Moors M.D.   On: 07/14/2019 16:34   MR ANGIO HEAD WO CONTRAST  Result Date: 07/07/2019 CLINICAL DATA:  Altered mental status EXAM: MRI HEAD WITHOUT CONTRAST MRA HEAD WITHOUT CONTRAST TECHNIQUE: Multiplanar, multiecho pulse sequences of the brain and surrounding structures were obtained without intravenous contrast. Angiographic images of the head were obtained using MRA technique without contrast. COMPARISON:  None. FINDINGS: MRI HEAD Motion artifact is present. Brain: There is no acute infarction or intracranial hemorrhage. There is no intracranial mass, mass effect, or edema. There is no hydrocephalus or extra-axial fluid collection. Prominence of the ventricles and sulci reflects generalized parenchymal volume loss. Patchy T2 hyperintensity in the supratentorial white matter is nonspecific but may reflect mild to moderate chronic microvascular ischemic changes. Vascular: Major vessel flow voids at the skull base are preserved. Skull and upper cervical spine: Normal marrow signal is preserved. Sinuses/Orbits: Minor mucosal thickening.  Orbits are unremarkable. Other: Sella is unremarkable.  Mastoid air cells are clear. MRA HEAD Motion artifact is present. Intracranial internal carotid arteries are patent. Proximal middle and anterior cerebral artery segments appear grossly patent but are poorly visualized. Intracranial vertebral arteries, basilar artery, and proximal posterior cerebral arteries are patent. IMPRESSION: Motion  degraded studies. No evidence of acute infarction, hemorrhage, or mass. No proximal intracranial vessel occlusion. Electronically Signed   By: Macy Mis M.D.   On: 07/07/2019 13:00    MR BRAIN WO CONTRAST  Result Date: 07/07/2019 CLINICAL DATA:  Altered mental status EXAM: MRI HEAD WITHOUT CONTRAST MRA HEAD WITHOUT CONTRAST TECHNIQUE: Multiplanar, multiecho pulse sequences of the brain and surrounding structures were obtained without intravenous contrast. Angiographic images of the head were obtained using MRA technique without contrast. COMPARISON:  None. FINDINGS: MRI HEAD Motion artifact is present. Brain: There is no acute infarction or intracranial hemorrhage. There is no intracranial mass, mass effect, or edema. There is no hydrocephalus or extra-axial fluid collection. Prominence of the ventricles and sulci reflects generalized parenchymal volume loss. Patchy T2 hyperintensity in the supratentorial white matter is nonspecific but may reflect mild to moderate chronic microvascular ischemic changes. Vascular: Major vessel flow voids at the skull base are preserved. Skull and upper cervical spine: Normal marrow signal is preserved. Sinuses/Orbits: Minor mucosal thickening.  Orbits are unremarkable. Other: Sella is unremarkable.  Mastoid air cells are clear. MRA HEAD Motion artifact is present. Intracranial internal carotid arteries are patent. Proximal middle and anterior cerebral artery segments appear grossly patent but are poorly visualized. Intracranial vertebral arteries, basilar artery, and proximal posterior cerebral arteries are patent. IMPRESSION: Motion degraded studies. No evidence of acute infarction, hemorrhage, or mass. No proximal intracranial vessel occlusion. Electronically Signed   By: Macy Mis M.D.   On: 07/07/2019 13:00   US Carotid Bilateral  Result Date: 07/07/2019 CLINICAL DATA:  TIA.  History of hypertension. EXAM: BILATERAL CAROTID DUPLEX ULTRASOUND TECHNIQUE: Pearline Cables scale imaging, color Doppler and duplex ultrasound were performed of bilateral carotid and vertebral arteries in the neck. COMPARISON:  None. FINDINGS: Criteria: Quantification of carotid  stenosis is based on velocity parameters that correlate the residual internal carotid diameter with NASCET-based stenosis levels, using the diameter of the distal internal carotid lumen as the denominator for stenosis measurement. The following velocity measurements were obtained: RIGHT ICA: 216/19 cm/sec CCA: 00/7 cm/sec SYSTOLIC ICA/CCA RATIO:  2.2 ECA: 40 cm/sec LEFT ICA: 80/18 cm/sec CCA: 12/19 cm/sec SYSTOLIC ICA/CCA RATIO:  1.2 ECA: 112 cm/sec RIGHT CAROTID ARTERY: There is a large amount of eccentric echogenic plaque within the right carotid bulb (image 21 and 24), extending to involve the origin and proximal aspects of the right internal carotid artery (image 34), which results in elevated peak systolic velocities within the proximal aspect the right internal carotid artery. Greatest acquired peak systolic velocity with the proximal right ICA measures 216 centimeters/second (image 37). RIGHT VERTEBRAL ARTERY:  Antegrade Flow LEFT CAROTID ARTERY: There is a large amount of eccentric echogenic plaque within the left carotid bulb (images 68 and 71), extending to involve the origin and proximal aspects of the left internal carotid artery (image 82), morphologically resulting at least 50% luminal narrowing, though definitely resulting in elevated peak systolic velocities within the interrogated course of the left internal carotid artery to suggest a hemodynamically significant stenosis. LEFT VERTEBRAL ARTERY:  Antegrade flow IMPRESSION: 1. Large amount of right-sided atherosclerotic plaque results in elevated peak systolic velocities within the right internal carotid artery which approach the greater than 70% luminal narrowing range. Further evaluation with CTA could performed as clinically indicated. 2. Large amount of left-sided atherosclerotic plaque, morphologically results in at least 50% luminal narrowing, though does not result in elevated peak systolic velocities within the interrogated course of the left  internal carotid artery to suggest  a hemodynamically significant stenosis. Again, further evaluation with CTA could performed as indicated. 3. Antegrade flow demonstrated within the bilateral vertebral arteries. Electronically Signed   By: Sandi Mariscal M.D.   On: 07/07/2019 16:33   US Venous Img Upper Uni Left (DVT)  Result Date: 07/18/2019 CLINICAL DATA:  86 year old female with a history of left arm edema EXAM: LEFT UPPER EXTREMITY VENOUS DOPPLER ULTRASOUND TECHNIQUE: Gray-scale sonography with graded compression, as well as color Doppler and duplex ultrasound were performed to evaluate the upper extremity deep venous system from the level of the subclavian vein and including the jugular, axillary, basilic, radial, ulnar and upper cephalic vein. Spectral Doppler was utilized to evaluate flow at rest and with distal augmentation maneuvers. COMPARISON:  None. FINDINGS: Contralateral Subclavian Vein: Respiratory phasicity is normal and symmetric with the symptomatic side. No evidence of thrombus. Normal compressibility. Internal Jugular Vein: Not imaged Subclavian Vein: No evidence of thrombus. Normal compressibility, respiratory phasicity and response to augmentation. Axillary Vein: No evidence of thrombus. Normal compressibility, respiratory phasicity and response to augmentation. Cephalic Vein: No evidence of thrombus. Normal compressibility, respiratory phasicity and response to augmentation. Basilic Vein: No evidence of thrombus. Normal compressibility, respiratory phasicity and response to augmentation. Brachial Veins: No evidence of thrombus. Normal compressibility, respiratory phasicity and response to augmentation. Radial Veins: No evidence of thrombus. Normal compressibility, respiratory phasicity and response to augmentation. Ulnar Veins: No evidence of thrombus. Normal compressibility, respiratory phasicity and response to augmentation. Other Findings:  None visualized. IMPRESSION: Sonographic survey of  the left upper extremity negative for DVT Electronically Signed   By: Corrie Mckusick D.O.   On: 07/18/2019 11:24   DG CHEST PORT 1 VIEW  Result Date: 07/12/2019 CLINICAL DATA:  Leukocytosis.  Altered mental status. EXAM: PORTABLE CHEST 1 VIEW COMPARISON:  03/10/2018 FINDINGS: Lung volumes are low.The cardiomediastinal contours are normal. Aortic atherosclerosis. Patient's chin partially obscures evaluation of the left lung apex, could not tolerate repositioning. Pulmonary vasculature is normal. No consolidation, pleural effusion, or pneumothorax. No acute osseous abnormalities are seen. IMPRESSION: Low lung volumes without acute abnormality. Aortic Atherosclerosis (ICD10-I70.0). Electronically Signed   By: Keith Rake M.D.   On: 07/12/2019 21:19   EEG adult  Result Date: 07/14/2019 Alexis Goodell, MD     07/14/2019  3:49 PM ELECTROENCEPHALOGRAM REPORT Patient: [name]       Room #: A84 Age: 86 y.o.        Sex: female Requesting Physician: Tat Report Date:  07/14/2019       Interpreting Physician: Alexis Goodell History: KHYLI SWAIM is an 86 y.o. female with altered mental status Medications: ASA, Lipitor, Rocephin, Plavix, Avapro, Myrbetriq, Zoloft Conditions of Recording:  This is a 21 channel routine scalp EEG performed with bipolar and monopolar montages arranged in accordance to the international 10/20 system of electrode placement. One channel was dedicated to EKG recording. The patient is in the drowsy and asleep states. Description:  The patient appears asleep for much of the recording.  The background is slow and poorly organized with superimposed symmetrical sleep spindles and vertex central sharp transients noted.  Towards the end of the recording the patient does open her eyes and although she does not respond the background does activate with a mixture of theta and alpha activity noted.  No epileptiform activity is noted. Hyperventilation and intermittent photic stimulation  were not performed. IMPRESSION: This electroencephalogram is consistent with normal drowse and sleep.  No epileptiform activity is noted.  Alexis Goodell, MD  Neurology [phone] 07/14/2019, 3:44 PM   ECHOCARDIOGRAM COMPLETE  Result Date: 07/07/2019    ECHOCARDIOGRAM REPORT   Patient Name:   Deborah Farrell Date of Exam: 07/07/2019 Medical Rec #:  [id number]       Height:       66.0 in Accession #:    [id number]      Weight:       142.4 lb Date of Birth:  Jul 12, 1917       BSA:          1.731 m Patient Age:    86 years        BP:           137/59 mmHg Patient Gender: F               HR:           67 bpm. Exam Location:  [facility] Procedure: 2D Echo Indications:    TIA 435.9 / G45.9  History:        Patient has no prior history of Echocardiogram examinations.                 Risk Factors:Hypertension, Dyslipidemia and Non-Smoker.  Sonographer:    [name] RDCS (AE) Referring Phys: [place]  1. Left ventricular ejection fraction, by estimation, is 65 to 70%. The left ventricle has normal function. The left ventricle has no regional wall motion abnormalities. There is moderate left ventricular hypertrophy. Left ventricular diastolic parameters are indeterminate.  2. Right ventricular systolic function is normal. The right ventricular size is normal. There is normal pulmonary artery systolic pressure.  3. The mitral valve is normal in structure. No evidence of mitral valve regurgitation. No evidence of mitral stenosis.  4. The aortic valve is tricuspid. Aortic valve regurgitation is not visualized. No aortic stenosis is present.  5. The inferior vena cava is normal in size with greater than 50% respiratory variability, suggesting right atrial pressure of 3 mmHg. FINDINGS  Left Ventricle: Left ventricular ejection fraction, by estimation, is 65 to 70%. The left ventricle has normal function. The left ventricle has no regional wall motion abnormalities. The left ventricular internal  cavity size was normal in size. There is  moderate left ventricular hypertrophy. Left ventricular diastolic parameters are indeterminate. Right Ventricle: The right ventricular size is normal. Right vetricular wall thickness was not assessed. Right ventricular systolic function is normal. There is normal pulmonary artery systolic pressure. The tricuspid regurgitant velocity is 1.93 m/s, and with an assumed right atrial pressure of 10 mmHg, the estimated right ventricular systolic pressure is 43.1 mmHg. Left Atrium: Left atrial size was normal in size. Right Atrium: Right atrial size was not well visualized. Pericardium: There is no evidence of pericardial effusion. Mitral Valve: The mitral valve is normal in structure. No evidence of mitral valve regurgitation. No evidence of mitral valve stenosis. Tricuspid Valve: The tricuspid valve is normal in structure. Tricuspid valve regurgitation is trivial. No evidence of tricuspid stenosis. Aortic Valve: The aortic valve is tricuspid. Aortic valve regurgitation is not visualized. No aortic stenosis is present. Aortic valve mean gradient measures 2.6 mmHg. Aortic valve peak gradient measures 4.3 mmHg. Aortic valve area, by VTI measures 2.94 cm. Pulmonic Valve: The pulmonic valve was not well visualized. Pulmonic valve regurgitation is not visualized. No evidence of pulmonic stenosis. Aorta: The aortic root is normal in size and structure. Pulmonary Artery: Indeterminant PASP, inadequate TR jet. Venous: The inferior vena cava is normal in size  with greater than 50% respiratory variability, suggesting right atrial pressure of 3 mmHg. IAS/Shunts: No atrial level shunt detected by color flow Doppler.  LEFT VENTRICLE PLAX 2D LVIDd:         3.54 cm  Diastology LVIDs:         1.88 cm  LV e' lateral:   7.07 cm/s LV PW:         1.26 cm  LV E/e' lateral: 9.7 LV IVS:        1.37 cm  LV e' medial:    5.44 cm/s LVOT diam:     2.00 cm  LV E/e' medial:  12.7 LV SV:         69 LV SV  Index:   40 LVOT Area:     3.14 cm  RIGHT VENTRICLE RV S prime:     14.00 cm/s TAPSE (M-mode): 1.8 cm LEFT ATRIUM           Index       RIGHT ATRIUM           Index LA diam:      3.60 cm 2.08 cm/m  RA Area:     12.20 cm LA Vol (A2C): 30.5 ml 17.62 ml/m RA Volume:   29.70 ml  17.16 ml/m LA Vol (A4C): 38.8 ml 22.41 ml/m  AORTIC VALVE AV Area (Vmax):    2.98 cm AV Area (Vmean):   2.64 cm AV Area (VTI):     2.94 cm AV Vmax:           104.25 cm/s AV Vmean:          76.936 cm/s AV VTI:            0.236 m AV Peak Grad:      4.3 mmHg AV Mean Grad:      2.6 mmHg LVOT Vmax:         98.95 cm/s LVOT Vmean:        64.652 cm/s LVOT VTI:          0.221 m LVOT/AV VTI ratio: 0.94  AORTA Ao Root diam: 3.00 cm MITRAL VALVE               TRICUSPID VALVE MV Area (PHT): 3.03 cm    TR Peak grad:   14.9 mmHg MV Decel Time: 250 msec    TR Vmax:        193.00 cm/s MV E velocity: 68.90 cm/s MV A velocity: 57.70 cm/s  SHUNTS MV E/A ratio:  1.19        Systemic VTI:  0.22 m                            Systemic Diam: 2.00 cm Carlyle Dolly MD Electronically signed by Carlyle Dolly MD Signature Date/Time: 07/07/2019/3:58:13 PM    Final       Subjective: Pt appears comfortable.   Discharge Exam: Vitals:   07/20/19 2129 07/21/19 0413  BP: (!) 168/81 (!) 164/82  Pulse: (!) 106 93  Resp: 20   Temp: 99.9 F (37.7 C) 99.2 F (37.3 C)  SpO2: 97% 98%   Vitals:   07/20/19 1038 07/20/19 1153 07/20/19 2129 07/21/19 0413  BP: (!) 175/65 (!) 179/74 (!) 168/81 (!) 164/82  Pulse: 87 91 (!) 106 93  Resp: 16 16 20    Temp: 99.9 F (37.7 C) 99.5 F (37.5 C) 99.9 F (37.7 C) 99.2 F (37.3 C)  TempSrc: Oral  Oral Oral Oral  SpO2: 96% 96% 97% 98%  Weight:      Height:        General: Pt appears comfortable, NAD.    The results of significant diagnostics from this hospitalization (including imaging, microbiology, ancillary and laboratory) are listed below for reference.     Microbiology: Recent Results (from the past  240 hour(s))  SARS Coronavirus 2 by RT PCR (hospital order, performed in [facility] hospital lab) Nasopharyngeal Nasopharyngeal Swab     Status: None   Collection Time: 07/12/19 11:56 AM   Specimen: Nasopharyngeal Swab  Result Value Ref Range Status   SARS Coronavirus 2 NEGATIVE NEGATIVE Final    Comment: (NOTE) SARS-CoV-2 target nucleic acids are NOT DETECTED.  The SARS-CoV-2 RNA is generally detectable in upper and lower respiratory specimens during the acute phase of infection. The lowest concentration of SARS-CoV-2 viral copies this assay can detect is 250 copies / mL. A negative result does not preclude SARS-CoV-2 infection and should not be used as the sole basis for treatment or other patient management decisions.  A negative result may occur with improper specimen collection / handling, submission of specimen other than nasopharyngeal swab, presence of viral mutation(s) within the areas targeted by this assay, and inadequate number of viral copies (<250 copies / mL). A negative result must be combined with clinical observations, patient history, and epidemiological information.  Fact Sheet for Patients:   StrictlyIdeas.no  Fact Sheet for Healthcare Providers: BankingDealers.co.za  This test is not yet approved or  cleared by the [place] FDA and has been authorized for detection and/or diagnosis of SARS-CoV-2 by FDA under an Emergency Use Authorization (EUA).  This EUA will remain in effect (meaning this test can be used) for the duration of the COVID-19 declaration under Section 564(b)(1) of the Act, 21 U.S.C. section 360bbb-3(b)(1), unless the authorization is terminated or revoked sooner.  Performed at [facility], [address], [place], [place] [address]   Culture, blood (routine x 2)     Status: Abnormal   Collection Time: 07/13/19  9:48 PM   Specimen: BLOOD RIGHT HAND  Result Value Ref Range Status   Specimen  Description   Final    BLOOD RIGHT HAND Performed at [facility], [address], [place], [place] [address]    Special Requests   Final    BOTTLES DRAWN AEROBIC AND ANAEROBIC Blood Culture adequate volume Performed at [facility], [address], [place], [place] [address]    Culture  Setup Time   Final    GRAM POSITIVE COCCI IN BOTH AEROBIC AND ANAEROBIC BOTTLES Gram Stain Report Called to,Read Back By and Verified With:  TAYLOR,M 1339 07/14/2019 BY TJONES Performed at [facility], [address], [place], [place] [address]    Culture (A)  Final    STAPHYLOCOCCUS HOMINIS SUSCEPTIBILITIES PERFORMED ON PREVIOUS CULTURE WITHIN THE LAST 5 DAYS. Performed at [facility], [place] [address], [place], [place] [address]    Report Status 07/16/2019 FINAL  Final  Culture, blood (routine x 2)     Status: Abnormal   Collection Time: 07/13/19  9:48 PM   Specimen: BLOOD  Result Value Ref Range Status   Specimen Description   Final    BLOOD BLOOD RIGHT WRIST Performed at [facility], [address] [place],  [address]    Special Requests   Final    BOTTLES DRAWN AEROBIC AND ANAEROBIC Blood Culture adequate volume Performed at [facility], [address], [place], [place] [address]    Culture  Setup Time   Final    GRAM POSITIVE COCCI ANAEROBIC BOTTLE Gram Stain Report Called to,Read Back By and Verified With: Geni Bers 1339 07/14/2019 BY TJONES [place] CRITICAL RESULT CALLED TO, READ BACK BY AND VERIFIED WITH: Geni Bers RN 07/14/19 1906 JDW Performed at [facility], [place] [address], [place], [place] [address]    Culture STAPHYLOCOCCUS HOMINIS (A)  Final   Report Status 07/16/2019 FINAL  Final   Organism ID, Bacteria STAPHYLOCOCCUS HOMINIS  Final      Susceptibility   Staphylococcus hominis - MIC*    CIPROFLOXACIN 2 INTERMEDIATE Intermediate     ERYTHROMYCIN >=8 RESISTANT Resistant     GENTAMICIN <=0.5 SENSITIVE Sensitive     OXACILLIN 0.5 RESISTANT Resistant      TETRACYCLINE >=16 RESISTANT Resistant     VANCOMYCIN 1 SENSITIVE Sensitive     TRIMETH/SULFA <=10 SENSITIVE Sensitive     CLINDAMYCIN <=0.25 SENSITIVE Sensitive     RIFAMPIN <=0.5 SENSITIVE Sensitive     Inducible Clindamycin NEGATIVE Sensitive     * STAPHYLOCOCCUS HOMINIS  Blood Culture ID Panel (Reflexed)     Status: Abnormal   Collection Time: 07/13/19  9:48 PM  Result Value Ref Range Status   Enterococcus species NOT DETECTED NOT DETECTED Final   Listeria monocytogenes NOT DETECTED NOT DETECTED Final   Staphylococcus species DETECTED (A) NOT DETECTED Final    Comment: Methicillin (oxacillin) resistant coagulase negative staphylococcus. Possible blood culture contaminant (unless isolated from more than one blood culture draw or clinical case suggests pathogenicity). No antibiotic treatment is indicated for blood  culture contaminants. CRITICAL RESULT CALLED TO, READ BACK BY AND VERIFIED WITH: Geni Bers RN 07/14/19 1906 JDW    Staphylococcus aureus (BCID) NOT DETECTED NOT DETECTED Final   Methicillin resistance DETECTED (A) NOT DETECTED Final    Comment: CRITICAL RESULT CALLED TO, READ BACK BY AND VERIFIED WITH: Geni Bers RN 07/14/19 1906 JDW    Streptococcus species NOT DETECTED NOT DETECTED Final   Streptococcus agalactiae NOT DETECTED NOT DETECTED Final   Streptococcus pneumoniae NOT DETECTED NOT DETECTED Final   Streptococcus pyogenes NOT DETECTED NOT DETECTED Final   Acinetobacter baumannii NOT DETECTED NOT DETECTED Final   Enterobacteriaceae species NOT DETECTED NOT DETECTED Final   Enterobacter cloacae complex NOT DETECTED NOT DETECTED Final   Escherichia coli NOT DETECTED NOT DETECTED Final   Klebsiella oxytoca NOT DETECTED NOT DETECTED Final   Klebsiella pneumoniae NOT DETECTED NOT DETECTED Final   Proteus species NOT DETECTED NOT DETECTED Final   Serratia marcescens NOT DETECTED NOT DETECTED Final   Haemophilus influenzae NOT DETECTED NOT DETECTED Final   Neisseria  meningitidis NOT DETECTED NOT DETECTED Final   Pseudomonas aeruginosa NOT DETECTED NOT DETECTED Final   Candida albicans NOT DETECTED NOT DETECTED Final   Candida glabrata NOT DETECTED NOT DETECTED Final   Candida krusei NOT DETECTED NOT DETECTED Final   Candida parapsilosis NOT DETECTED NOT DETECTED Final   Candida tropicalis NOT DETECTED NOT DETECTED Final    Comment: Performed at [facility], [place]. [address], [place], [place] [address]  Culture, Urine     Status: Abnormal   Collection Time: 07/14/19  3:54 AM   Specimen: Urine, Clean Catch  Result Value Ref Range Status   Specimen Description   Final    URINE, CLEAN CATCH Performed at [facility], [address] [place],  [address]    Special Requests  Final    NONE Performed at [facility], [address], [place], [place] [address]    Culture 30,000 COLONIES/mL KLEBSIELLA PNEUMONIAE (A)  Final   Report Status 07/17/2019 FINAL  Final   Organism ID, Bacteria KLEBSIELLA PNEUMONIAE (A)  Final      Susceptibility   Klebsiella pneumoniae - MIC*    AMPICILLIN RESISTANT Resistant     CEFAZOLIN <=4 SENSITIVE Sensitive     CEFTRIAXONE <=0.25 SENSITIVE Sensitive     CIPROFLOXACIN <=0.25 SENSITIVE Sensitive     GENTAMICIN <=1 SENSITIVE Sensitive     IMIPENEM <=0.25 SENSITIVE Sensitive     NITROFURANTOIN 32 SENSITIVE Sensitive     TRIMETH/SULFA <=20 SENSITIVE Sensitive     AMPICILLIN/SULBACTAM 4 SENSITIVE Sensitive     PIP/TAZO <=4 SENSITIVE Sensitive     * 30,000 COLONIES/mL KLEBSIELLA PNEUMONIAE  Culture, blood (Routine X 2) w Reflex to ID Panel     Status: None   Collection Time: 07/16/19  5:00 AM   Specimen: BLOOD RIGHT WRIST  Result Value Ref Range Status   Specimen Description   Final    BLOOD RIGHT WRIST BOTTLES DRAWN AEROBIC AND ANAEROBIC   Special Requests Blood Culture adequate volume  Final   Culture   Final    NO GROWTH 5 DAYS Performed at [facility], [address] [place], [place] [address]     Report Status 07/21/2019 FINAL  Final  Culture, blood (Routine X 2) w Reflex to ID Panel     Status: None   Collection Time: 07/16/19  5:05 AM   Specimen: BLOOD RIGHT HAND  Result Value Ref Range Status   Specimen Description   Final    BLOOD RIGHT HAND BOTTLES DRAWN AEROBIC AND ANAEROBIC   Special Requests Blood Culture adequate volume  Final   Culture   Final    NO GROWTH 5 DAYS Performed at [facility], [address] [place], [place] [address]    Report Status 07/21/2019 FINAL  Final  Culture, Urine     Status: None   Collection Time: 07/16/19 11:33 AM   Specimen: Urine, Clean Catch  Result Value Ref Range Status   Specimen Description   Final    URINE, CLEAN CATCH Performed at [facility], [address], [place], [place] [address]    Special Requests   Final    NONE Performed at [facility], [address], [place], [place] [address]    Culture   Final    NO GROWTH Performed at [facility], [place] [address], [place], [place] [address]    Report Status 07/17/2019 FINAL  Final  Culture, blood (routine x 2)     Status: None (Preliminary result)   Collection Time: 07/20/19  7:26 AM   Specimen: Right Antecubital; Blood  Result Value Ref Range Status   Specimen Description RIGHT ANTECUBITAL  Final   Special Requests   Final    BOTTLES DRAWN AEROBIC AND ANAEROBIC Blood Culture adequate volume   Culture   Final    NO GROWTH < 24 HOURS Performed at [facility], [address] [place], [place] [address]    Report Status PENDING  Incomplete  Culture, blood (routine x 2)     Status: None (Preliminary result)   Collection Time: 07/20/19  7:26 AM   Specimen: BLOOD RIGHT HAND  Result Value Ref Range Status   Specimen Description BLOOD RIGHT HAND  Final   Special Requests   Final    BOTTLES DRAWN AEROBIC  AND ANAEROBIC Blood Culture adequate volume   Culture   Final    NO GROWTH < 24 HOURS Performed at [facility], [address], [place], [place] [address]    Report  Status PENDING  Incomplete  SARS Coronavirus 2 by RT PCR (hospital order, performed in [facility] hospital lab) Nasopharyngeal Nasopharyngeal Swab     Status: None   Collection Time: 07/21/19 11:39 AM   Specimen: Nasopharyngeal Swab  Result Value Ref Range Status   SARS Coronavirus 2 NEGATIVE NEGATIVE Final    Comment: (NOTE) SARS-CoV-2 target nucleic acids are NOT DETECTED.  The SARS-CoV-2 RNA is generally detectable in upper and lower respiratory specimens during the acute phase of infection. The lowest concentration of SARS-CoV-2 viral copies this assay can detect is 250 copies / mL. A negative result does not preclude SARS-CoV-2 infection and should not be used as the sole basis for treatment or other patient management decisions.  A negative result may occur with improper specimen collection / handling, submission of specimen other than nasopharyngeal swab, presence of viral mutation(s) within the areas targeted by this assay, and inadequate number of viral copies (<250 copies / mL). A negative result must be combined with clinical observations, patient history, and epidemiological information.  Fact Sheet for Patients:   StrictlyIdeas.no  Fact Sheet for Healthcare Providers: BankingDealers.co.za  This test is not yet approved or  cleared by the [place] FDA and has been authorized for detection and/or diagnosis of SARS-CoV-2 by FDA under an Emergency Use Authorization (EUA).  This EUA will remain in effect (meaning this test can be used) for the duration of the COVID-19 declaration under Section 564(b)(1) of the Act, 21 U.S.C. section 360bbb-3(b)(1), unless the authorization is terminated or revoked sooner.  Performed at [facility], [address] [place], [place] [address]      Labs: BNP (last 3 results) No results for input(s): BNP in the last 8760 hours. Basic Metabolic Panel: Recent Labs  Lab 07/14/19 1415  07/15/19 0448 07/16/19 0500 07/17/19 0619 07/19/19 0502 07/20/19 0443  NA  --  138 138 139 144 146*  K  --  3.5 3.5 3.5 3.5 3.5  CL  --  105 107 106 112* 115*  CO2  --  23 21* 21* 23 18*  GLUCOSE  --  122* 119* 142* 123* 106*  BUN  --  28* 30* 29* 33* 30*  CREATININE  --  0.72 0.70 0.76 0.70 0.66  CALCIUM  --  8.6* 8.5* 8.8* 8.6* 8.3*  MG 2.1  --  2.0  --   --  2.4  PHOS  --   --   --   --   --  3.6   Liver Function Tests: Recent Labs  Lab 07/17/19 0619 07/19/19 0502 07/20/19 0443  AST 36 52* 50*  ALT 36 62* 59*  ALKPHOS 68 65 68  BILITOT 0.8 0.6 0.7  PROT 6.8 6.1* 5.8*  ALBUMIN 2.7* 2.2* 2.3*   No results for input(s): LIPASE, AMYLASE in the last 168 hours. Recent Labs  Lab 07/14/19 1415  AMMONIA 28   CBC: Recent Labs  Lab 07/16/19 0500 07/17/19 0619 07/18/19 0543 07/19/19 0502 07/20/19 0443  WBC 19.4* 22.9* 25.1* 23.1* 22.0*  NEUTROABS  --   --   --  19.6* 18.3*  HGB 9.1* 10.7* 9.7* 9.2* 8.8*  HCT 28.3* 32.5* 30.2* 29.0* 27.7*  MCV 97.3 96.7 97.1 96.7 98.2  PLT 233 284 292 262 PLATELET CLUMPING, SUGGEST RECOLLECTION  OF SAMPLE IN CITRATE TUBE.   Cardiac Enzymes: No results for input(s): CKTOTAL, CKMB, CKMBINDEX, TROPONINI in the last 168 hours. BNP: Invalid input(s): POCBNP CBG: Recent Labs  Lab 07/20/19 0736 07/20/19 1139  GLUCAP 101* 109*   D-Dimer No results for input(s): DDIMER in the last 72 hours. Hgb A1c No results for input(s): HGBA1C in the last 72 hours. Lipid Profile No results for input(s): CHOL, HDL, LDLCALC, TRIG, CHOLHDL, LDLDIRECT in the last 72 hours. Thyroid function studies No results for input(s): TSH, T4TOTAL, T3FREE, THYROIDAB in the last 72 hours.  Invalid input(s): FREET3 Anemia work up No results for input(s): VITAMINB12, FOLATE, FERRITIN, TIBC, IRON, RETICCTPCT in the last 72 hours. Urinalysis    Component Value Date/Time   COLORURINE YELLOW 07/16/2019 1133   APPEARANCEUR CLEAR 07/16/2019 1133   LABSPEC 1.019  07/16/2019 1133   PHURINE 5.0 07/16/2019 1133   GLUCOSEU NEGATIVE 07/16/2019 1133   HGBUR MODERATE (A) 07/16/2019 1133   BILIRUBINUR NEGATIVE 07/16/2019 1133   KETONESUR NEGATIVE 07/16/2019 1133   PROTEINUR 100 (A) 07/16/2019 1133   UROBILINOGEN 1.0 11/02/2011 2208   NITRITE NEGATIVE 07/16/2019 1133   LEUKOCYTESUR NEGATIVE 07/16/2019 1133   Sepsis Labs Invalid input(s): PROCALCITONIN,  WBC,  LACTICIDVEN Microbiology Recent Results (from the past 240 hour(s))  SARS Coronavirus 2 by RT PCR (hospital order, performed in [place] hospital lab) Nasopharyngeal Nasopharyngeal Swab     Status: None   Collection Time: 07/12/19 11:56 AM   Specimen: Nasopharyngeal Swab  Result Value Ref Range Status   SARS Coronavirus 2 NEGATIVE NEGATIVE Final    Comment: (NOTE) SARS-CoV-2 target nucleic acids are NOT DETECTED.  The SARS-CoV-2 RNA is generally detectable in upper and lower respiratory specimens during the acute phase of infection. The lowest concentration of SARS-CoV-2 viral copies this assay can detect is 250 copies / mL. A negative result does not preclude SARS-CoV-2 infection and should not be used as the sole basis for treatment or other patient management decisions.  A negative result may occur with improper specimen collection / handling, submission of specimen other than nasopharyngeal swab, presence of viral mutation(s) within the areas targeted by this assay, and inadequate number of viral copies (<250 copies / mL). A negative result must be combined with clinical observations, patient history, and epidemiological information.  Fact Sheet for Patients:   StrictlyIdeas.no  Fact Sheet for Healthcare Providers: BankingDealers.co.za  This test is not yet approved or  cleared by the [place] FDA and has been authorized for detection and/or diagnosis of SARS-CoV-2 by FDA under an Emergency Use Authorization (EUA).  This EUA will  remain in effect (meaning this test can be used) for the duration of the COVID-19 declaration under Section 564(b)(1) of the Act, 21 U.S.C. section 360bbb-3(b)(1), unless the authorization is terminated or revoked sooner.  Performed at [facility], [address] [place], [place] [address]   Culture, blood (routine x 2)     Status: Abnormal   Collection Time: 07/13/19  9:48 PM   Specimen: BLOOD RIGHT HAND  Result Value Ref Range Status   Specimen Description   Final    BLOOD RIGHT HAND Performed at [facility], [address], [place], [place] [address]    Special Requests   Final    BOTTLES DRAWN AEROBIC AND ANAEROBIC Blood Culture adequate volume Performed at [facility], [address] [place], [place] [address]    Culture  Setup Time   Final    GRAM POSITIVE COCCI IN BOTH  AEROBIC AND ANAEROBIC BOTTLES Gram Stain Report Called to,Read Back By and Verified With:  TAYLOR,M 1339 07/14/2019 BY TJONES Performed at [facility], [address] [place], [place] [address]    Culture (A)  Final    STAPHYLOCOCCUS HOMINIS SUSCEPTIBILITIES PERFORMED ON PREVIOUS CULTURE WITHIN THE LAST 5 DAYS. Performed at [facility], [place] [address], [place], [place] [address]    Report Status 07/16/2019 FINAL  Final  Culture, blood (routine x 2)     Status: Abnormal   Collection Time: 07/13/19  9:48 PM   Specimen: BLOOD  Result Value Ref Range Status   Specimen Description   Final    BLOOD BLOOD RIGHT WRIST Performed at [facility], [address] [place], [place] [address]    Special Requests   Final    BOTTLES DRAWN AEROBIC AND ANAEROBIC Blood Culture adequate volume Performed at [facility], [address] [place], [place] [address]    Culture  Setup Time   Final    GRAM POSITIVE COCCI ANAEROBIC BOTTLE Gram Stain Report Called to,Read Back By and Verified With: Ardis Hughs 07/14/2019 BY TJONES [place] CRITICAL RESULT CALLED TO, READ BACK BY AND VERIFIED WITH: Geni Bers RN  07/14/19 1906 JDW Performed at [facility], [place] [address], [place], [place] [address]    Culture STAPHYLOCOCCUS HOMINIS (A)  Final   Report Status 07/16/2019 FINAL  Final   Organism ID, Bacteria STAPHYLOCOCCUS HOMINIS  Final      Susceptibility   Staphylococcus hominis - MIC*    CIPROFLOXACIN 2 INTERMEDIATE Intermediate     ERYTHROMYCIN >=8 RESISTANT Resistant     GENTAMICIN <=0.5 SENSITIVE Sensitive     OXACILLIN 0.5 RESISTANT Resistant     TETRACYCLINE >=16 RESISTANT Resistant     VANCOMYCIN 1 SENSITIVE Sensitive     TRIMETH/SULFA <=10 SENSITIVE Sensitive     CLINDAMYCIN <=0.25 SENSITIVE Sensitive     RIFAMPIN <=0.5 SENSITIVE Sensitive     Inducible Clindamycin NEGATIVE Sensitive     * STAPHYLOCOCCUS HOMINIS  Blood Culture ID Panel (Reflexed)     Status: Abnormal   Collection Time: 07/13/19  9:48 PM  Result Value Ref Range Status   Enterococcus species NOT DETECTED NOT DETECTED Final   Listeria monocytogenes NOT DETECTED NOT DETECTED Final   Staphylococcus species DETECTED (A) NOT DETECTED Final    Comment: Methicillin (oxacillin) resistant coagulase negative staphylococcus. Possible blood culture contaminant (unless isolated from more than one blood culture draw or clinical case suggests pathogenicity). No antibiotic treatment is indicated for blood  culture contaminants. CRITICAL RESULT CALLED TO, READ BACK BY AND VERIFIED WITH: Geni Bers RN 07/14/19 1906 JDW    Staphylococcus aureus (BCID) NOT DETECTED NOT DETECTED Final   Methicillin resistance DETECTED (A) NOT DETECTED Final    Comment: CRITICAL RESULT CALLED TO, READ BACK BY AND VERIFIED WITH: Geni Bers RN 07/14/19 1906 JDW    Streptococcus species NOT DETECTED NOT DETECTED Final   Streptococcus agalactiae NOT DETECTED NOT DETECTED Final   Streptococcus pneumoniae NOT DETECTED NOT DETECTED Final   Streptococcus pyogenes NOT DETECTED NOT DETECTED Final   Acinetobacter baumannii NOT DETECTED NOT DETECTED Final    Enterobacteriaceae species NOT DETECTED NOT DETECTED Final   Enterobacter cloacae complex NOT DETECTED NOT DETECTED Final   Escherichia coli NOT DETECTED NOT DETECTED Final   Klebsiella oxytoca NOT DETECTED NOT DETECTED Final   Klebsiella pneumoniae NOT DETECTED NOT DETECTED Final   Proteus species NOT DETECTED NOT DETECTED Final  Serratia marcescens NOT DETECTED NOT DETECTED Final   Haemophilus influenzae NOT DETECTED NOT DETECTED Final   Neisseria meningitidis NOT DETECTED NOT DETECTED Final   Pseudomonas aeruginosa NOT DETECTED NOT DETECTED Final   Candida albicans NOT DETECTED NOT DETECTED Final   Candida glabrata NOT DETECTED NOT DETECTED Final   Candida krusei NOT DETECTED NOT DETECTED Final   Candida parapsilosis NOT DETECTED NOT DETECTED Final   Candida tropicalis NOT DETECTED NOT DETECTED Final    Comment: Performed at [facility], [place] [address], [place], [place] [address]  Culture, Urine     Status: Abnormal   Collection Time: 07/14/19  3:54 AM   Specimen: Urine, Clean Catch  Result Value Ref Range Status   Specimen Description   Final    URINE, CLEAN CATCH Performed at [facility], [address] [place], [place] [address]    Special Requests   Final    NONE Performed at [facility], [address] [place], [place] [address]    Culture 30,000 COLONIES/mL KLEBSIELLA PNEUMONIAE (A)  Final   Report Status 07/17/2019 FINAL  Final   Organism ID, Bacteria KLEBSIELLA PNEUMONIAE (A)  Final      Susceptibility   Klebsiella pneumoniae - MIC*    AMPICILLIN RESISTANT Resistant     CEFAZOLIN <=4 SENSITIVE Sensitive     CEFTRIAXONE <=0.25 SENSITIVE Sensitive     CIPROFLOXACIN <=0.25 SENSITIVE Sensitive     GENTAMICIN <=1 SENSITIVE Sensitive     IMIPENEM <=0.25 SENSITIVE Sensitive     NITROFURANTOIN 32 SENSITIVE Sensitive     TRIMETH/SULFA <=20 SENSITIVE Sensitive     AMPICILLIN/SULBACTAM 4 SENSITIVE Sensitive     PIP/TAZO <=4 SENSITIVE Sensitive     * 30,000  COLONIES/mL KLEBSIELLA PNEUMONIAE  Culture, blood (Routine X 2) w Reflex to ID Panel     Status: None   Collection Time: 07/16/19  5:00 AM   Specimen: BLOOD RIGHT WRIST  Result Value Ref Range Status   Specimen Description   Final    BLOOD RIGHT WRIST BOTTLES DRAWN AEROBIC AND ANAEROBIC   Special Requests Blood Culture adequate volume  Final   Culture   Final    NO GROWTH 5 DAYS Performed at [facility], [address] [place], [place] [address]    Report Status 07/21/2019 FINAL  Final  Culture, blood (Routine X 2) w Reflex to ID Panel     Status: None   Collection Time: 07/16/19  5:05 AM   Specimen: BLOOD RIGHT HAND  Result Value Ref Range Status   Specimen Description   Final    BLOOD RIGHT HAND BOTTLES DRAWN AEROBIC AND ANAEROBIC   Special Requests Blood Culture adequate volume  Final   Culture   Final    NO GROWTH 5 DAYS Performed at [facility], [address], [place], [place] [address]    Report Status 07/21/2019 FINAL  Final  Culture, Urine     Status: None   Collection Time: 07/16/19 11:33 AM   Specimen: Urine, Clean Catch  Result Value Ref Range Status   Specimen Description   Final    URINE, CLEAN CATCH Performed at [facility], [address], [place], [place] [address]    Special Requests   Final    NONE Performed at [facility], [address] [place], [place] [address]    Culture   Final    NO GROWTH Performed at [facility], [place] [address] [place], [place] [address]  Report Status 07/17/2019 FINAL  Final  Culture, blood (routine x 2)     Status: None (Preliminary result)   Collection Time: 07/20/19  7:26 AM   Specimen: Right Antecubital; Blood  Result Value Ref Range Status   Specimen Description RIGHT ANTECUBITAL  Final   Special Requests   Final    BOTTLES DRAWN AEROBIC AND ANAEROBIC Blood Culture adequate volume   Culture   Final    NO GROWTH < 24 HOURS Performed at [facility], [address] [place], [place] [address]    Report  Status PENDING  Incomplete  Culture, blood (routine x 2)     Status: None (Preliminary result)   Collection Time: 07/20/19  7:26 AM   Specimen: BLOOD RIGHT HAND  Result Value Ref Range Status   Specimen Description BLOOD RIGHT HAND  Final   Special Requests   Final    BOTTLES DRAWN AEROBIC AND ANAEROBIC Blood Culture adequate volume   Culture   Final    NO GROWTH < 24 HOURS Performed at [facility], [address], [place], [place] [address]    Report Status PENDING  Incomplete  SARS Coronavirus 2 by RT PCR (hospital order, performed in [place] hospital lab) Nasopharyngeal Nasopharyngeal Swab     Status: None   Collection Time: 07/21/19 11:39 AM   Specimen: Nasopharyngeal Swab  Result Value Ref Range Status   SARS Coronavirus 2 NEGATIVE NEGATIVE Final    Comment: (NOTE) SARS-CoV-2 target nucleic acids are NOT DETECTED.  The SARS-CoV-2 RNA is generally detectable in upper and lower respiratory specimens during the acute phase of infection. The lowest concentration of SARS-CoV-2 viral copies this assay can detect is 250 copies / mL. A negative result does not preclude SARS-CoV-2 infection and should not be used as the sole basis for treatment or other patient management decisions.  A negative result may occur with improper specimen collection / handling, submission of specimen other than nasopharyngeal swab, presence of viral mutation(s) within the areas targeted by this assay, and inadequate number of viral copies (<250 copies / mL). A negative result must be combined with clinical observations, patient history, and epidemiological information.  Fact Sheet for Patients:   StrictlyIdeas.no  Fact Sheet for Healthcare Providers: BankingDealers.co.za  This test is not yet approved or  cleared by the [place] FDA and has been authorized for detection and/or diagnosis of SARS-CoV-2 by FDA under an Emergency Use Authorization  (EUA).  This EUA will remain in effect (meaning this test can be used) for the duration of the COVID-19 declaration under Section 564(b)(1) of the Act, 21 U.S.C. section 360bbb-3(b)(1), unless the authorization is terminated or revoked sooner.  Performed at [facility], [address], [place], [place] [address]     Time coordinating discharge:   SIGNED:  Irwin Brakeman, MD  [facility] 07/21/2019, 1:42 PM How to contact the [facility] Attending or Consulting provider [place] or covering provider during after hours [place], for this patient?  1. Check the care team in [facility] and look for a) attending/consulting [facility] provider listed and b) the [facility] team listed 2. Log into [email or web address] and use [place]'s universal password to access. If you do not have the password, please contact the hospital operator. 3. Locate the [facility] provider you are looking for under [facility] and page to a number that you can be directly reached. 4. If you still have difficulty reaching the provider, please page the [facility], [facility], [facility] (Director on Call) for the  Hospitalists listed on amion for assistance.

## 2019-07-21 NOTE — TOC Transition Note (Signed)
Transition of Care [facility]) - CM/SW Discharge Note   Patient Details  Name: Deborah Farrell MRN: [id number] Date of Birth: [birth year]  Transition of [facility]) CM/SW Contact:  Shade Flood, LCSW Phone Number: 07/21/2019, 2:21 PM   Clinical Narrative:     [facility]. Updated MD and pt's family member in the room with pt. Admission paperwork is completed. DC clinical sent electronically.   EMS form printed to [employer]. RN updated. She will call report and EMS when pt ready.  There are no other TOC needs for dc.  Final next level of care: [place] Barriers to Discharge: Barriers Resolved   Patient Goals and CMS Choice        Discharge Placement                Patient to be transferred to facility by: EMS   Patient and family notified of of transfer: 07/21/19  Discharge Plan and Services                                     Social Determinants of Health (SDOH) Interventions     Readmission Risk Interventions No flowsheet data found.

## 2019-07-21 NOTE — Care Management Important Message (Signed)
Important Message  Patient Details  Name: Deborah Farrell MRN: [id number] Date of Birth: 30-Apr-1917   Medicare Important Message Given:  Yes     Shade Flood, LCSW 07/21/2019, 1:37 PM

## 2019-07-26 LAB — CULTURE, BLOOD (ROUTINE X 2)
Culture: NO GROWTH
Culture: NO GROWTH
Special Requests: ADEQUATE
Special Requests: ADEQUATE

## 2019-08-15 DEATH — deceased

## 2019-11-21 IMAGING — DX DG ABDOMEN 1V
1 series · 1 of 1 positions shown · non-contrast
Comparison: 12/25/2007 abdominal CT

CLINICAL DATA: Constipation

EXAM:
ABDOMEN - 1 VIEW

[abdomen kub]
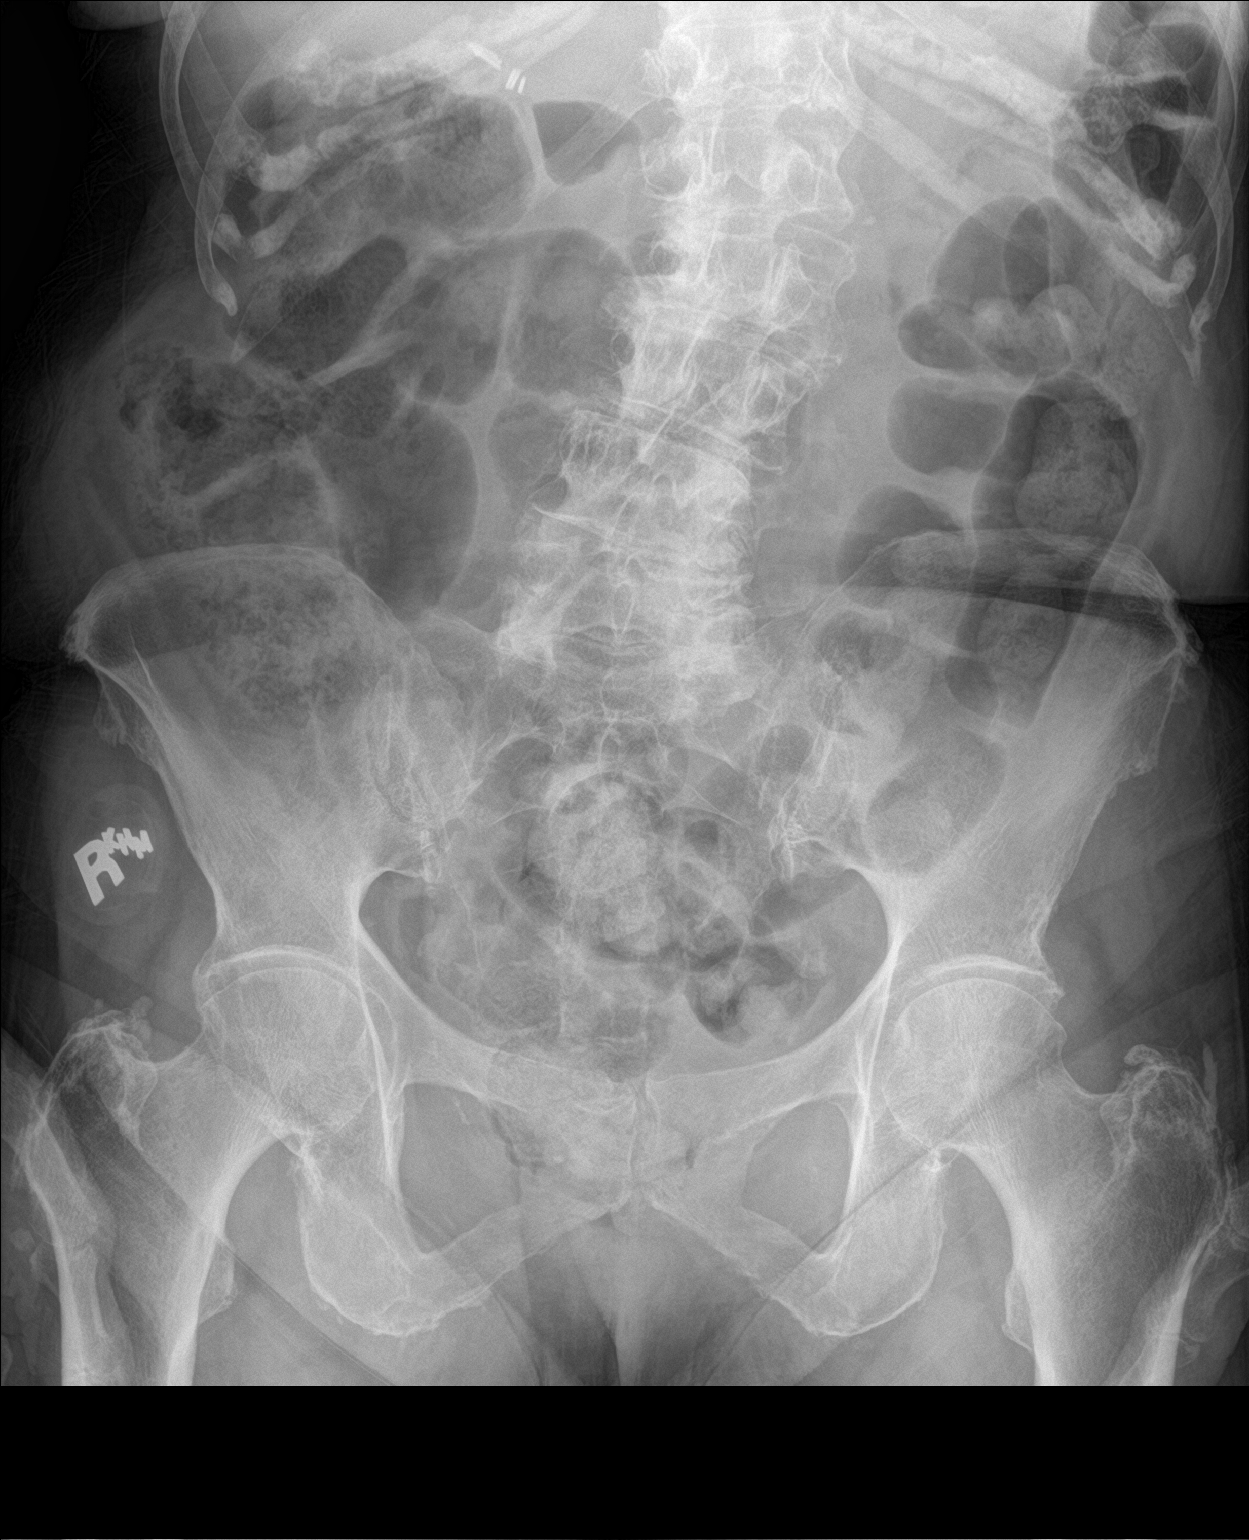

[1 of 1 positions shown; findings below may reference images not displayed]

FINDINGS: Moderate stool volume seen throughout most colonic segments without
over distension or rectal impaction. No small bowel distention. No
concerning mass effect or gas collection. Cholecystectomy clips.
Advanced spinal degeneration with scoliosis.
IMPRESSION: Moderate stool burden.  No obstruction or rectal impaction.

## 2020-08-09 IMAGING — CR DG CHEST 1V PORT
1 series · 1 of 1 positions shown · non-contrast
Comparison: 02/05/2018

CLINICAL DATA: [AGE] female with weakness

EXAM:
PORTABLE CHEST 1 VIEW

[portable]
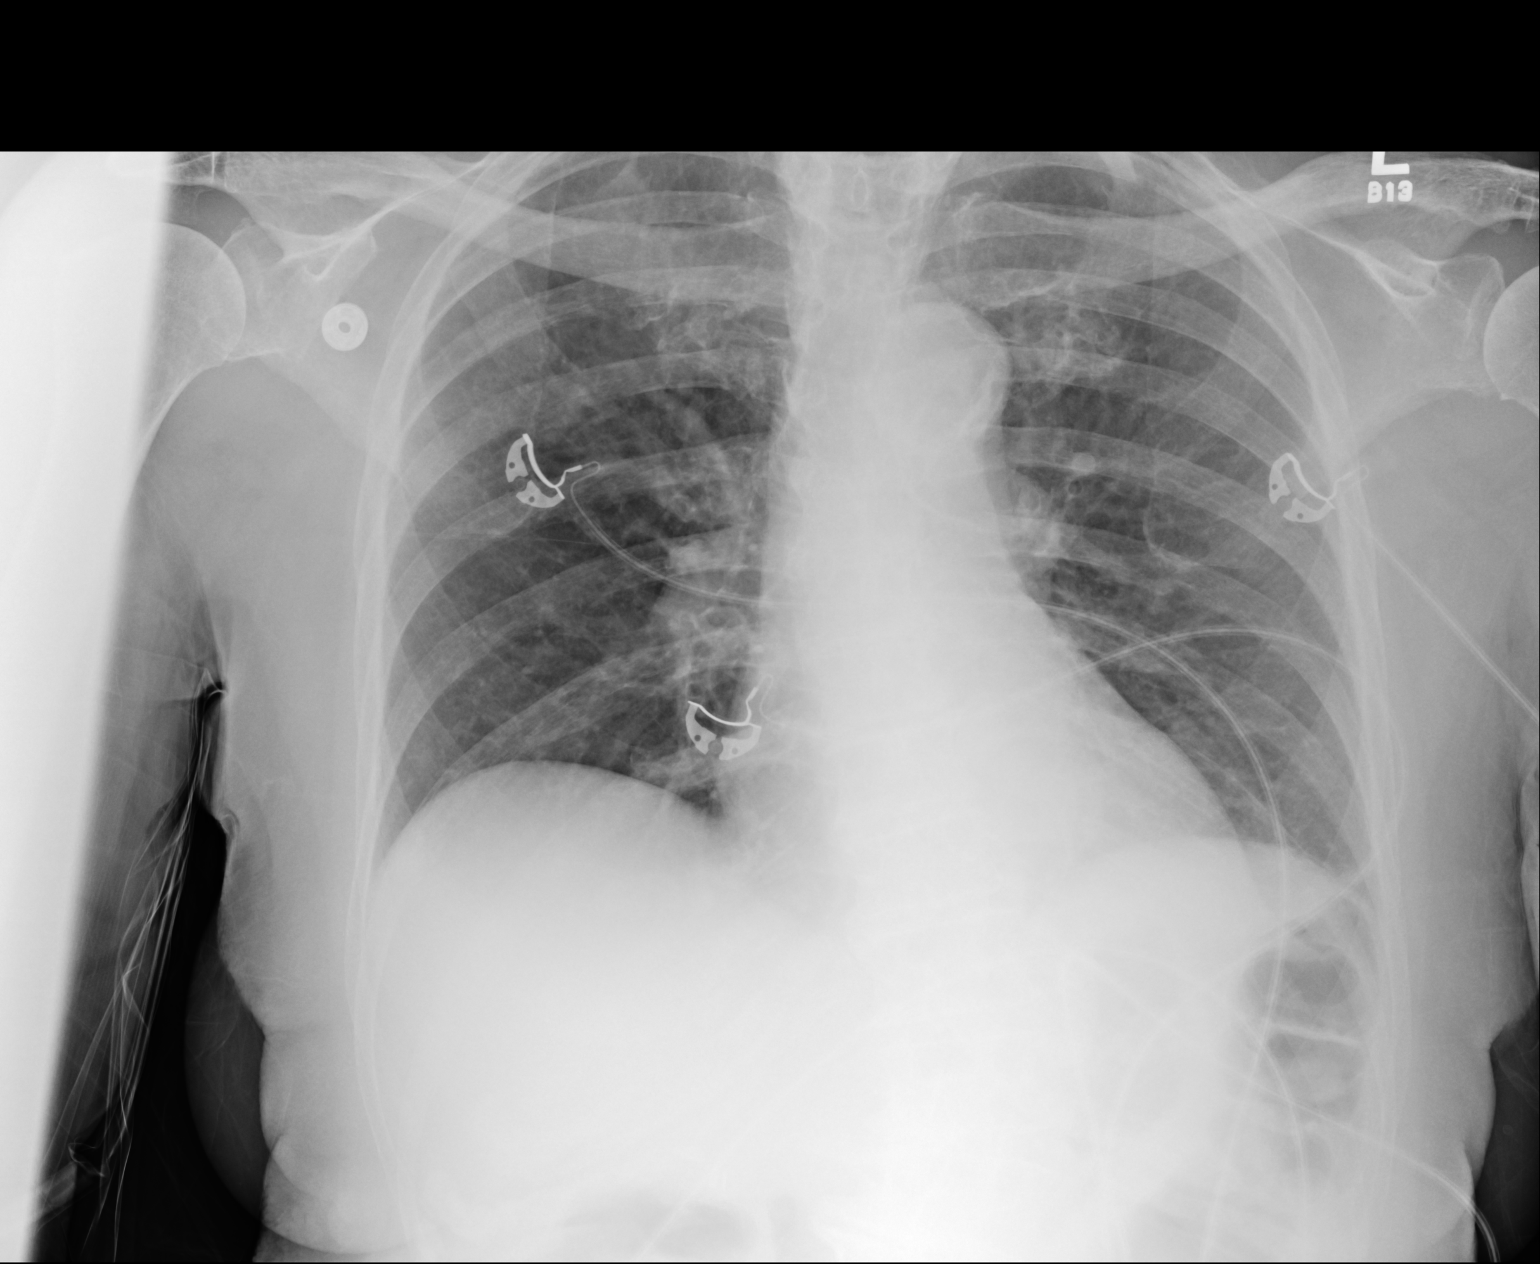

[1 of 1 positions shown; findings below may reference images not displayed]

FINDINGS: Cardiomediastinal silhouette unchanged in size and contour. No
evidence of central vascular congestion. No interlobular septal
thickening. Low lung volumes persist. No evidence pneumothorax or
pleural effusion. No confluent airspace disease. Coarsened
interstitial markings similar to prior.
IMPRESSION: Chronic lung changes without evidence of acute cardiopulmonary
disease

## 2021-12-15 IMAGING — CT CT ABD-PELV W/O CM
2 of 4 series · 11 of 46 positions shown, 12 images · non-contrast
Comparison: 02/05/2018

CLINICAL DATA: Abdominal pain, leukocytosis, altered mental status

EXAM:
CT ABDOMEN AND PELVIS WITHOUT CONTRAST
TECHNIQUE: Multidetector CT imaging of the abdomen and pelvis was performed
following the standard protocol without IV contrast.

[Series 3: axial st · axial · 0.52mm/px · z∈[+787,+1202]mm · 8 of 97 slices shown, 9 images]
[im 7/97  soft-tissue]
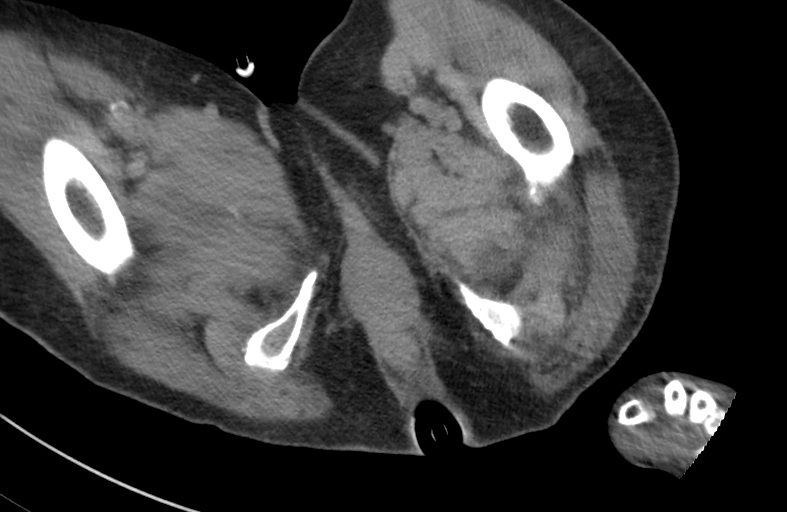
[im 7/97  bone]
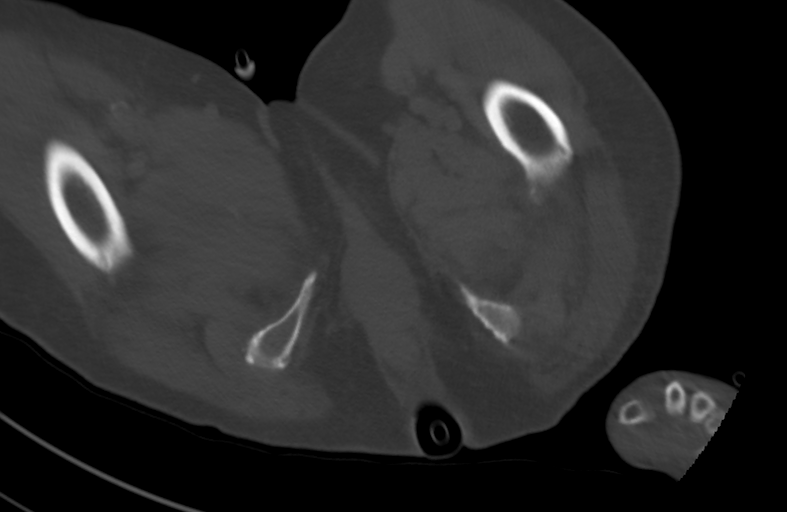
[im 20/97  soft-tissue]
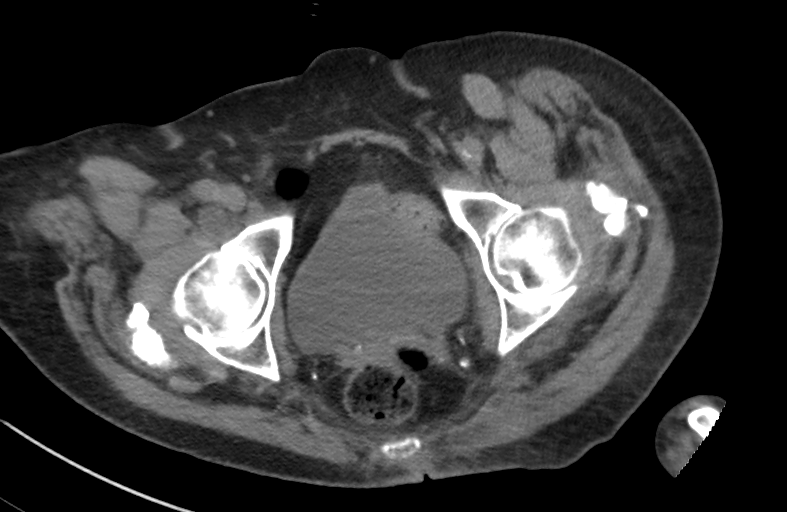
[im 33/97  soft-tissue]
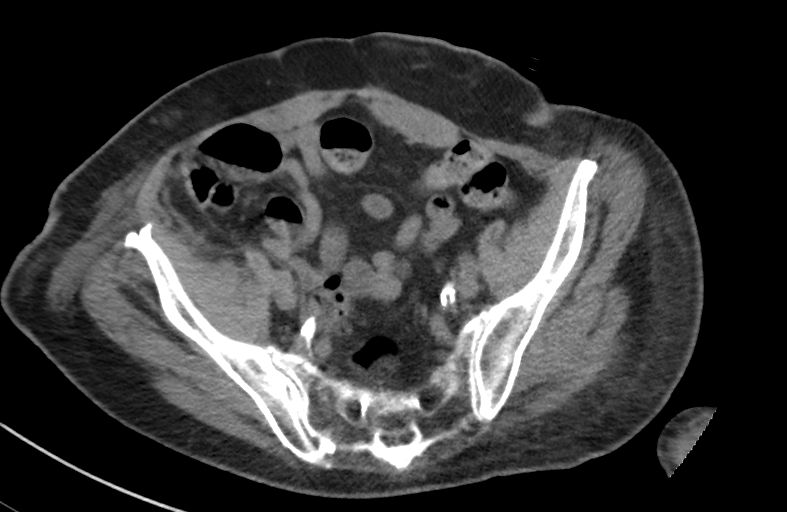
[im 45/97  soft-tissue]
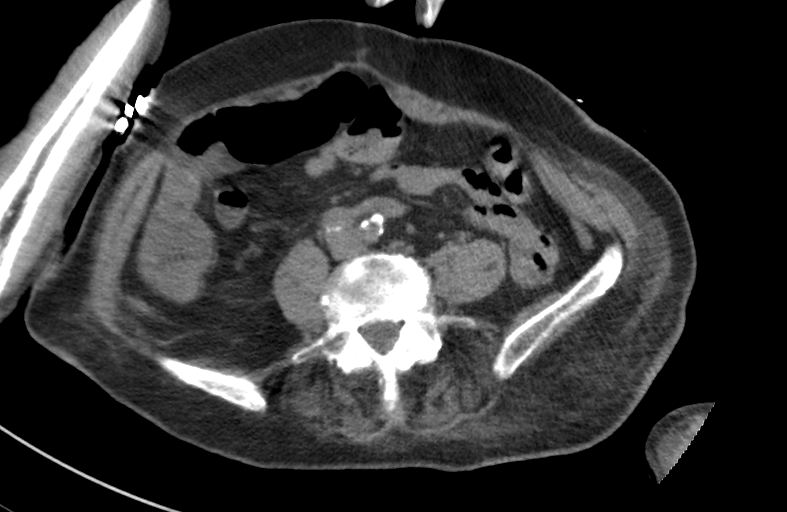
[im 52/97  soft-tissue]
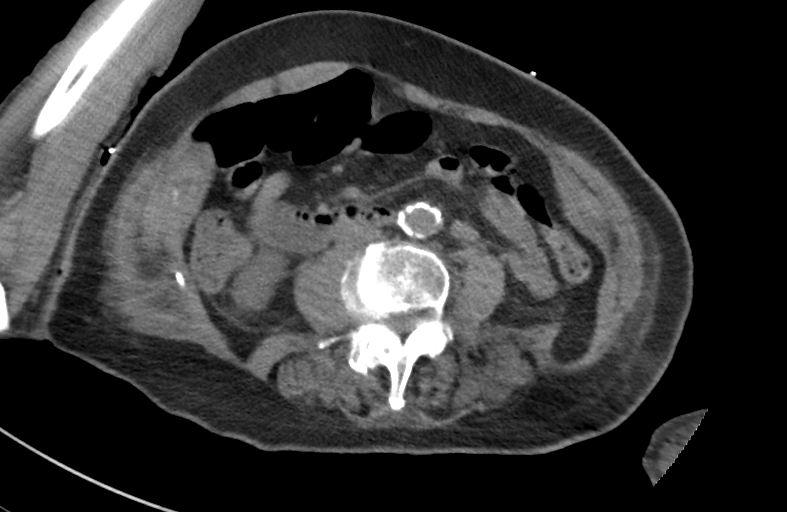
[im 65/97  soft-tissue]
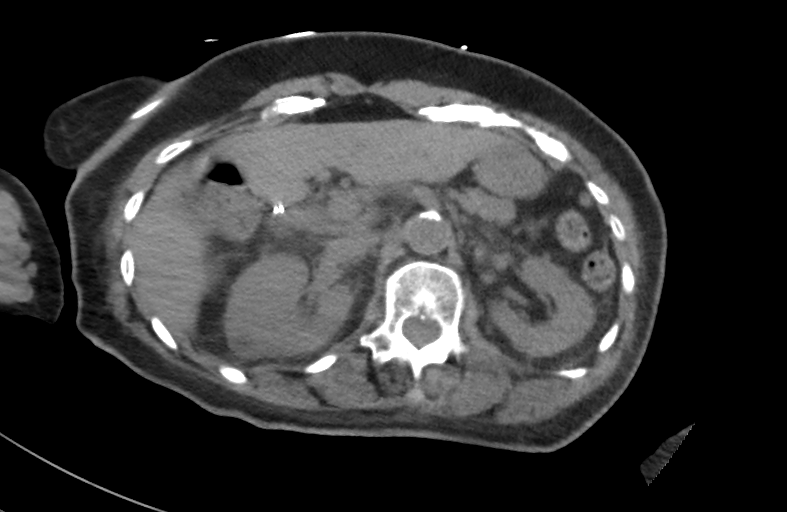
[im 77/97  soft-tissue]
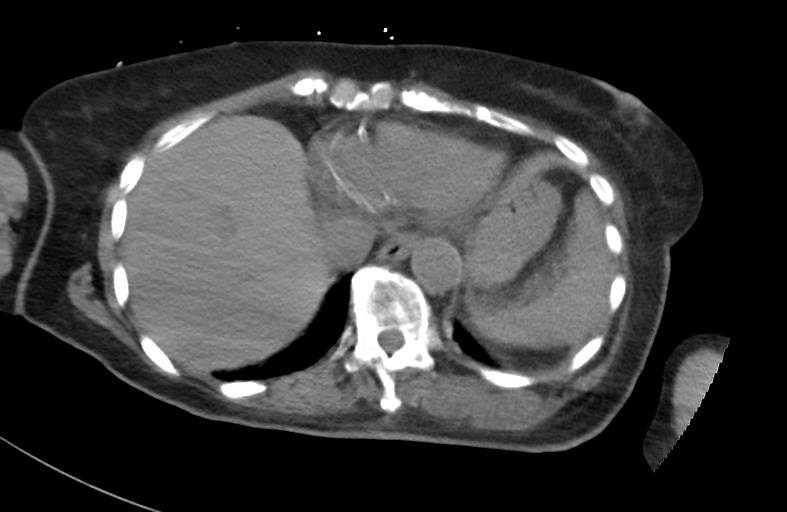
[im 90/97  soft-tissue]
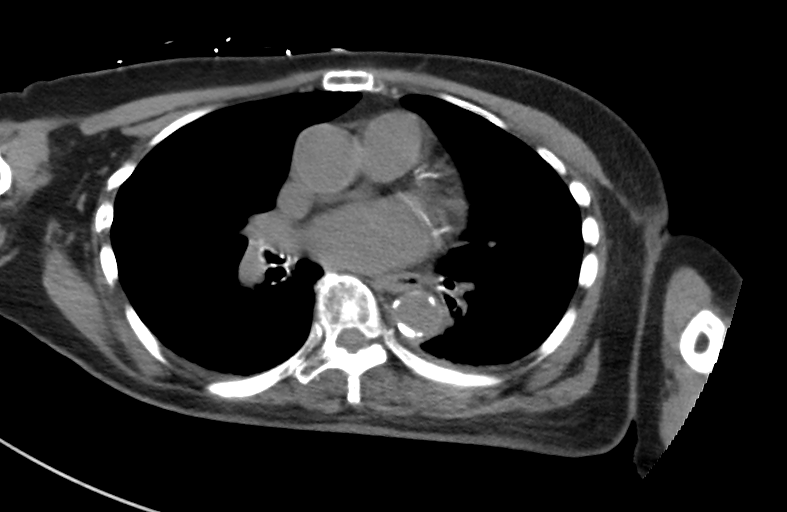

[Series 6: coronal st · coronal · 0.80mm/px · 3 of 91 slices shown]
[im 31/91  soft-tissue]
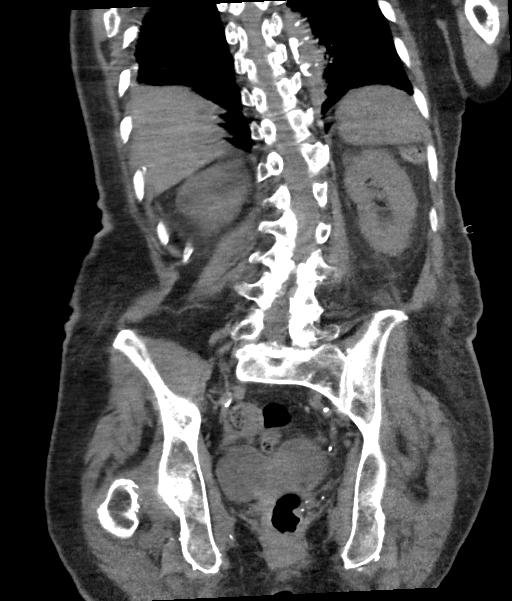
[im 41/91  soft-tissue]
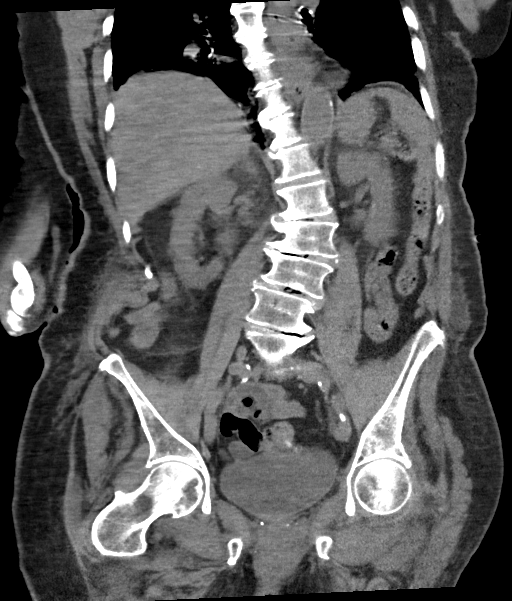
[im 51/91  soft-tissue]
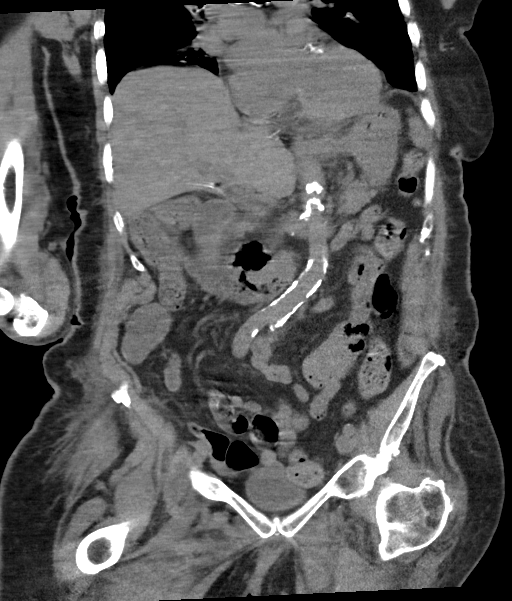

[11 of 46 positions shown; findings below may reference images not displayed]

FINDINGS: Lower chest: Bandlike scarring and or atelectasis of the included
bilateral lung bases. Three-vessel coronary artery calcifications
and stents.

Hepatobiliary: No focal liver abnormality is seen. Status post
cholecystectomy. No biliary dilatation.

Pancreas: Unremarkable. No pancreatic ductal dilatation or
surrounding inflammatory changes.

Spleen: Normal in size without significant abnormality.

Adrenals/Urinary Tract: Adrenal glands are unremarkable. Kidneys are
normal, without renal calculi, solid lesion, or hydronephrosis.
Bladder is unremarkable.

Stomach/Bowel: Stomach is within normal limits. Appendix is
surgically absent no evidence of bowel wall thickening, distention,
or inflammatory changes. Occasional sigmoid diverticula.

Vascular/Lymphatic: Aortic atherosclerosis. No enlarged abdominal or
pelvic lymph nodes.

Reproductive: Status post hysterectomy.

Other: No abdominal wall hernia or abnormality. No abdominopelvic
ascites.

Musculoskeletal: No acute or significant osseous findings.
IMPRESSION: 1. No non-contrast CT findings of the abdomen or pelvis to explain
abdominal pain or leukocytosis.
2. Status post cholecystectomy, appendectomy, and hysterectomy.
3. Coronary artery disease.  Aortic Atherosclerosis ([id number]).

## 2021-12-17 IMAGING — US US EXTREM  UP VENOUS*L*
1 series · 13 of 24 positions shown · non-contrast
Comparison: None.

CLINICAL DATA: [AGE] female with a history of left arm edema



[Series 1: us venous img upper uni left (dvt) · portal-venous · 13 of 49 slices shown]
[im 1/49]
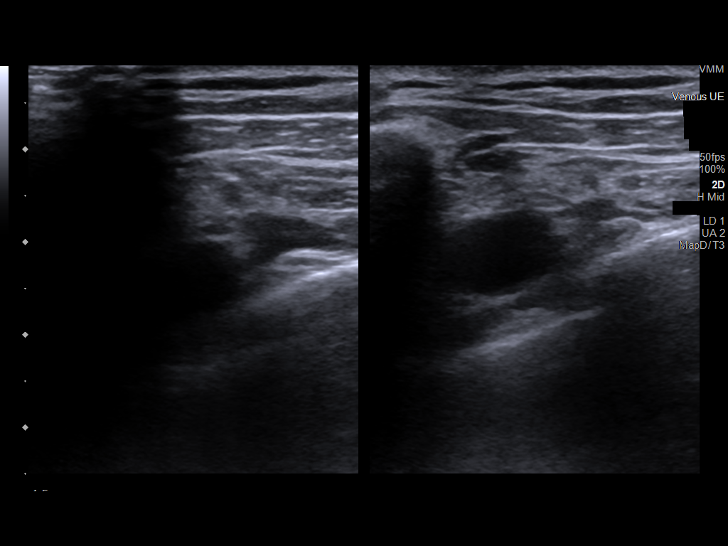
[im 5/49]
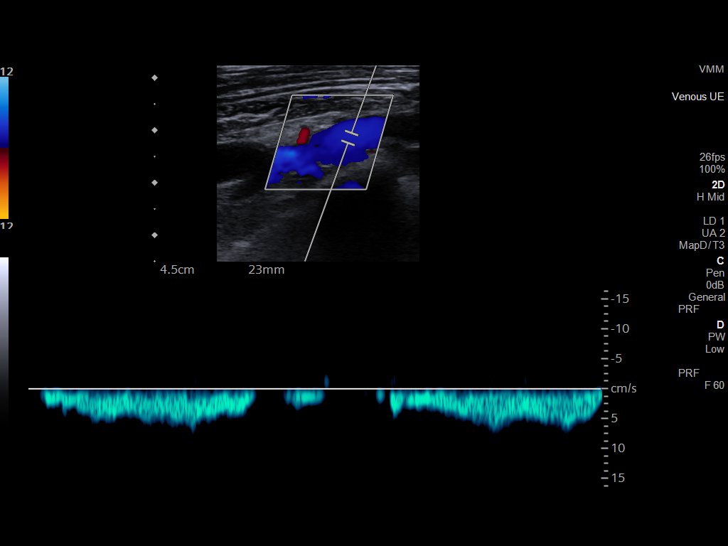
[im 9/49]
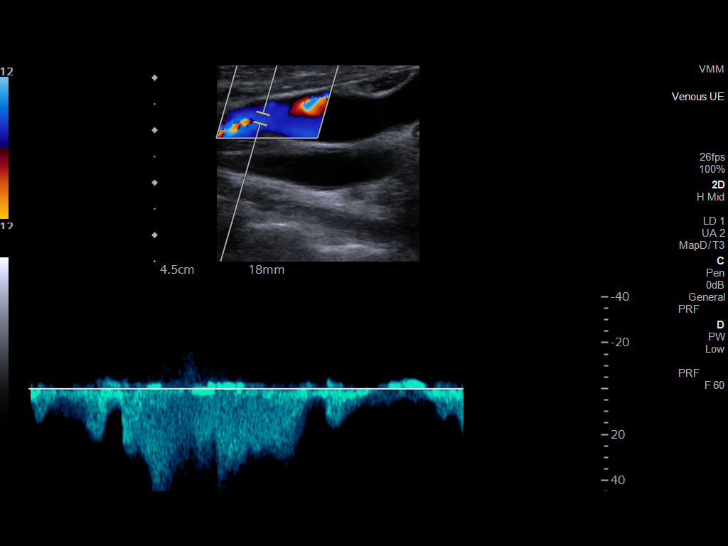
[im 13/49]
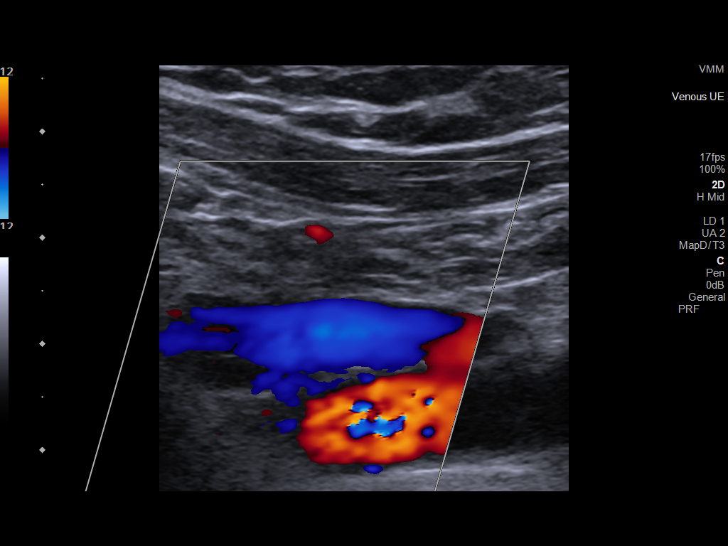
[im 17/49]
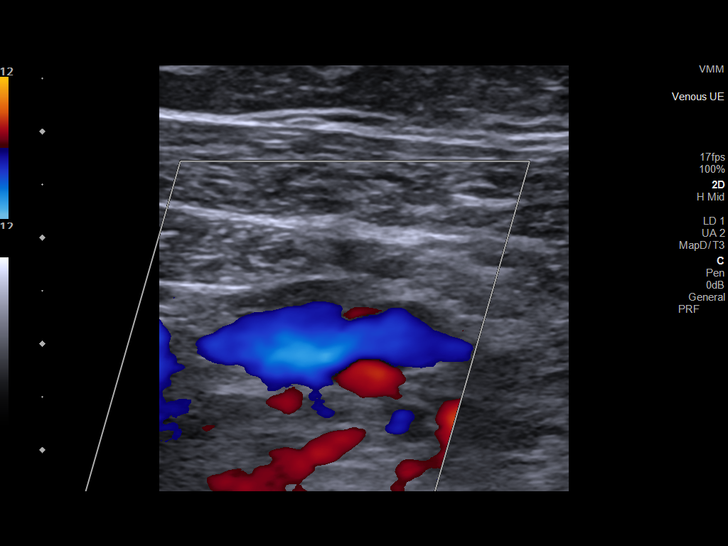
[im 21/49]
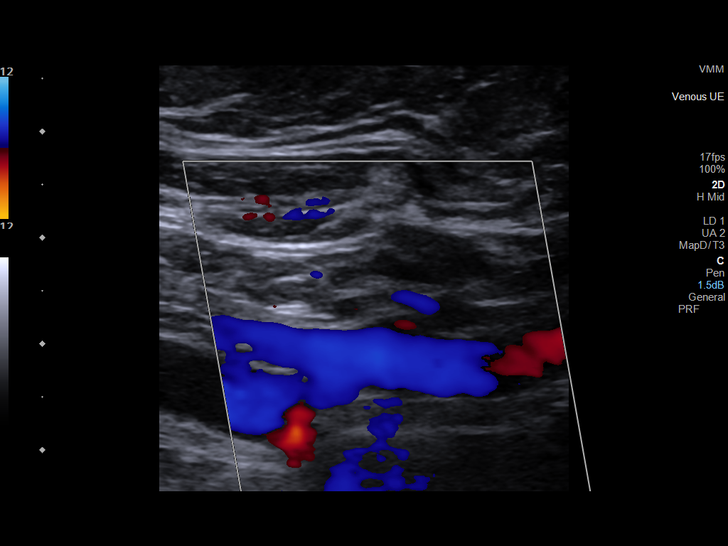
[im 26/49]
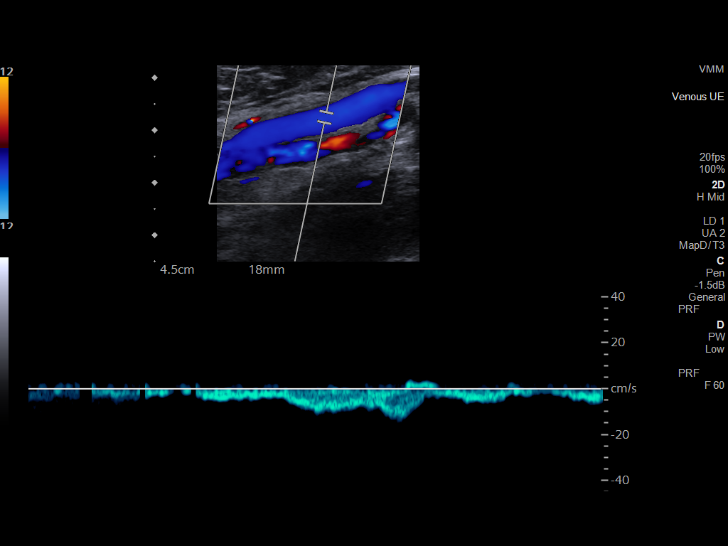
[im 28/49]
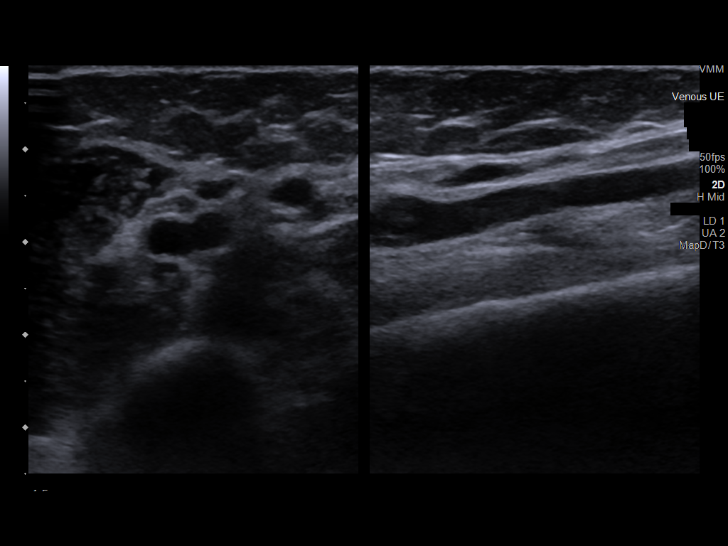
[im 32/49]
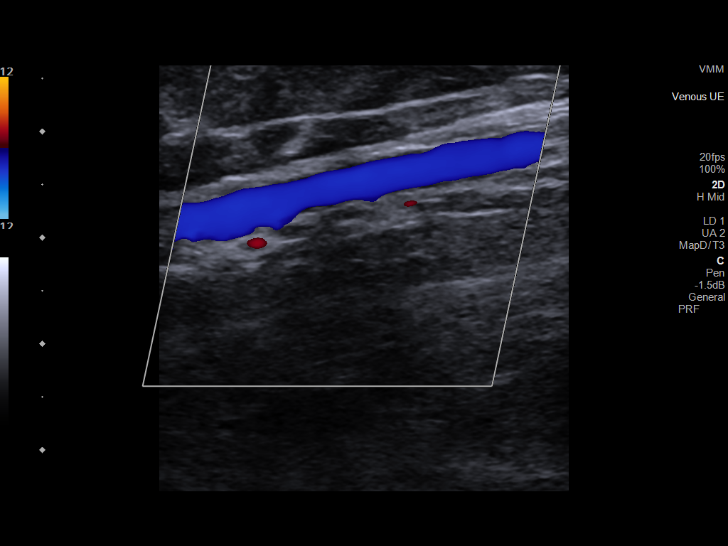
[im 36/49]
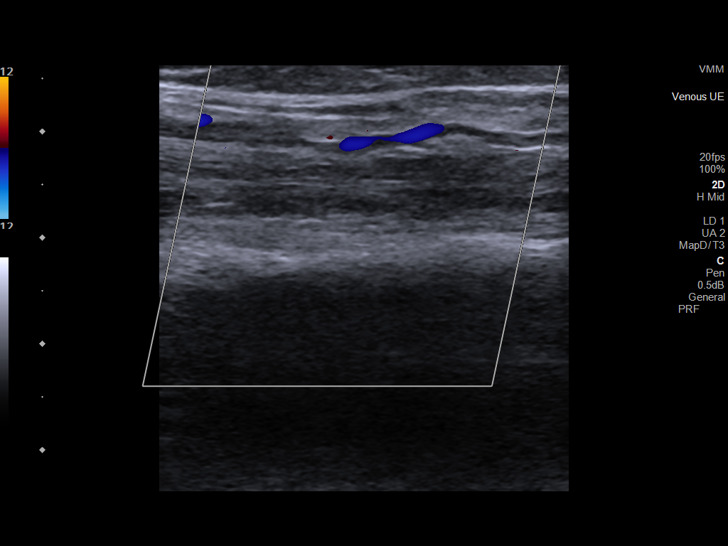
[im 40/49]
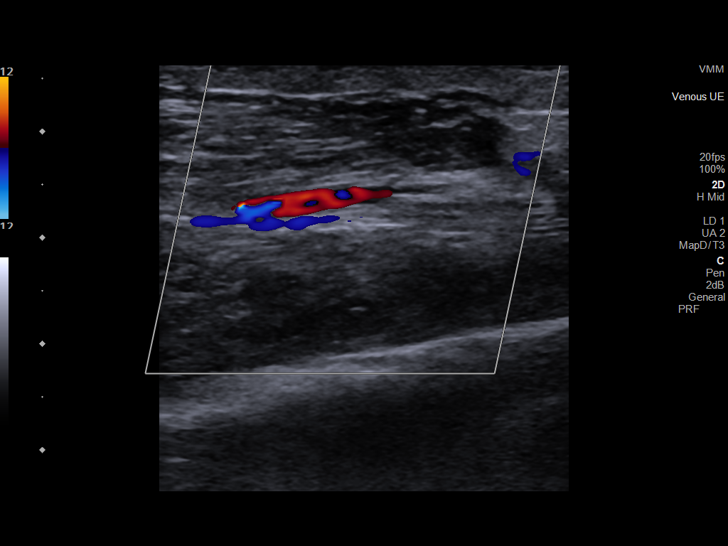
[im 44/49]
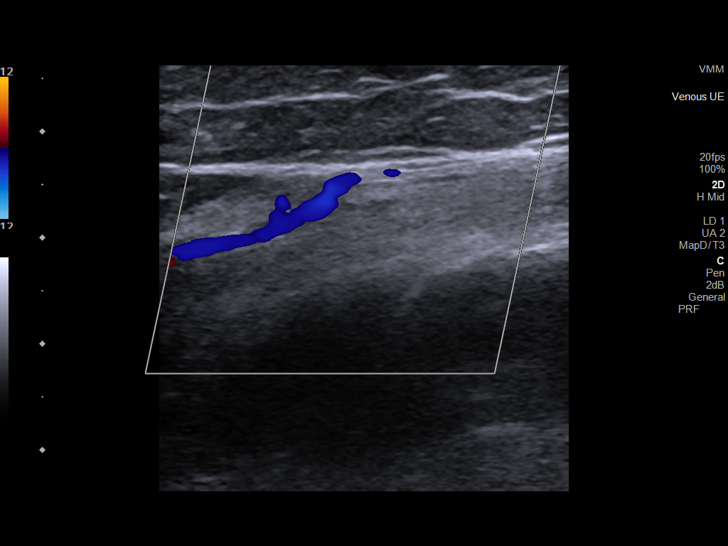
[im 49/49]
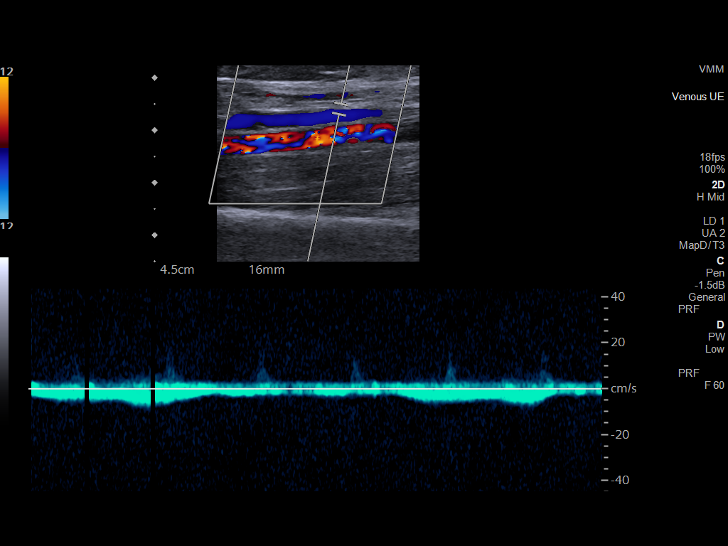

[13 of 24 positions shown; findings below may reference images not displayed]

FINDINGS: Contralateral Subclavian Vein: Respiratory phasicity is normal and
symmetric with the symptomatic side. No evidence of thrombus. Normal
compressibility.

Internal Jugular Vein: Not imaged

Subclavian Vein: No evidence of thrombus. Normal compressibility,
respiratory phasicity and response to augmentation.

Axillary Vein: No evidence of thrombus. Normal compressibility,
respiratory phasicity and response to augmentation.

Cephalic Vein: No evidence of thrombus. Normal compressibility,
respiratory phasicity and response to augmentation.

Basilic Vein: No evidence of thrombus. Normal compressibility,
respiratory phasicity and response to augmentation.

Brachial Veins: No evidence of thrombus. Normal compressibility,
respiratory phasicity and response to augmentation.

Radial Veins: No evidence of thrombus. Normal compressibility,
respiratory phasicity and response to augmentation.

Ulnar Veins: No evidence of thrombus. Normal compressibility,
respiratory phasicity and response to augmentation.

Other Findings:  None visualized.
IMPRESSION: Sonographic survey of the left upper extremity negative for DVT
